# Patient Record
Sex: Female | Born: 1940 | Race: White | Hispanic: No | Marital: Single | Smoking: Never smoker
Health system: Southern US, Community
[De-identification: ages and names within clinical notes are randomized; demographics above are authoritative.]

## PROBLEM LIST (undated history)

## (undated) DIAGNOSIS — K219 Gastro-esophageal reflux disease without esophagitis: Secondary | ICD-10-CM

## (undated) DIAGNOSIS — K635 Polyp of colon: Secondary | ICD-10-CM

## (undated) DIAGNOSIS — F419 Anxiety disorder, unspecified: Secondary | ICD-10-CM

## (undated) DIAGNOSIS — I428 Other cardiomyopathies: Secondary | ICD-10-CM

## (undated) DIAGNOSIS — I10 Essential (primary) hypertension: Secondary | ICD-10-CM

## (undated) DIAGNOSIS — D649 Anemia, unspecified: Secondary | ICD-10-CM

## (undated) DIAGNOSIS — I509 Heart failure, unspecified: Secondary | ICD-10-CM

## (undated) DIAGNOSIS — I519 Heart disease, unspecified: Secondary | ICD-10-CM

## (undated) DIAGNOSIS — J45909 Unspecified asthma, uncomplicated: Secondary | ICD-10-CM

## (undated) DIAGNOSIS — M1990 Unspecified osteoarthritis, unspecified site: Secondary | ICD-10-CM

## (undated) DIAGNOSIS — F22 Delusional disorders: Secondary | ICD-10-CM

## (undated) DIAGNOSIS — K5790 Diverticulosis of intestine, part unspecified, without perforation or abscess without bleeding: Secondary | ICD-10-CM

## (undated) DIAGNOSIS — I5189 Other ill-defined heart diseases: Secondary | ICD-10-CM

## (undated) DIAGNOSIS — T4145XA Adverse effect of unspecified anesthetic, initial encounter: Secondary | ICD-10-CM

## (undated) DIAGNOSIS — I5022 Chronic systolic (congestive) heart failure: Secondary | ICD-10-CM

## (undated) DIAGNOSIS — F209 Schizophrenia, unspecified: Secondary | ICD-10-CM

## (undated) DIAGNOSIS — J449 Chronic obstructive pulmonary disease, unspecified: Secondary | ICD-10-CM

## (undated) HISTORY — DX: Other cardiomyopathies: I42.8

## (undated) HISTORY — DX: Gastro-esophageal reflux disease without esophagitis: K21.9

## (undated) HISTORY — DX: Heart failure, unspecified: I50.9

## (undated) HISTORY — DX: Essential (primary) hypertension: I10

## (undated) HISTORY — DX: Diverticulosis of intestine, part unspecified, without perforation or abscess without bleeding: K57.90

## (undated) HISTORY — PX: DIAGNOSTIC LAPAROSCOPY: SUR761

## (undated) HISTORY — PX: TONSILLECTOMY: SUR1361

## (undated) HISTORY — DX: Unspecified osteoarthritis, unspecified site: M19.90

## (undated) HISTORY — PX: HEEL SPUR SURGERY: SHX665

## (undated) HISTORY — PX: COLOSTOMY: SHX63

## (undated) HISTORY — DX: Unspecified asthma, uncomplicated: J45.909

## (undated) HISTORY — DX: Chronic systolic (congestive) heart failure: I50.22

## (undated) HISTORY — DX: Heart disease, unspecified: I51.9

## (undated) HISTORY — DX: Anxiety disorder, unspecified: F41.9

## (undated) HISTORY — DX: Polyp of colon: K63.5

## (undated) HISTORY — PX: CATARACT EXTRACTION: SUR2

## (undated) HISTORY — DX: Other ill-defined heart diseases: I51.89

## (undated) HISTORY — PX: COLOSTOMY TAKEDOWN: SHX5258

## (undated) HISTORY — DX: Anemia, unspecified: D64.9

---

## 1964-04-09 DIAGNOSIS — T8859XA Other complications of anesthesia, initial encounter: Secondary | ICD-10-CM

## 1964-04-09 HISTORY — DX: Other complications of anesthesia, initial encounter: T88.59XA

## 1964-04-09 HISTORY — PX: APPENDECTOMY: SHX54

## 1988-04-09 DIAGNOSIS — D649 Anemia, unspecified: Secondary | ICD-10-CM

## 1988-04-09 HISTORY — DX: Anemia, unspecified: D64.9

## 1993-04-09 DIAGNOSIS — J45909 Unspecified asthma, uncomplicated: Secondary | ICD-10-CM

## 1993-04-09 HISTORY — DX: Unspecified asthma, uncomplicated: J45.909

## 1999-07-17 ENCOUNTER — Other Ambulatory Visit: Admission: RE | Admit: 1999-07-17 | Discharge: 1999-07-17 | Payer: Self-pay | Admitting: Obstetrics and Gynecology

## 2000-06-26 ENCOUNTER — Ambulatory Visit (HOSPITAL_COMMUNITY): Admission: RE | Admit: 2000-06-26 | Discharge: 2000-06-26 | Payer: Self-pay | Admitting: Gastroenterology

## 2000-06-26 ENCOUNTER — Encounter (INDEPENDENT_AMBULATORY_CARE_PROVIDER_SITE_OTHER): Payer: Self-pay | Admitting: *Deleted

## 2000-07-15 ENCOUNTER — Encounter: Payer: Self-pay | Admitting: Internal Medicine

## 2000-07-15 ENCOUNTER — Encounter: Admission: RE | Admit: 2000-07-15 | Discharge: 2000-07-15 | Payer: Self-pay | Admitting: Internal Medicine

## 2000-07-29 ENCOUNTER — Other Ambulatory Visit: Admission: RE | Admit: 2000-07-29 | Discharge: 2000-07-29 | Payer: Self-pay | Admitting: Obstetrics and Gynecology

## 2001-04-09 DIAGNOSIS — K5790 Diverticulosis of intestine, part unspecified, without perforation or abscess without bleeding: Secondary | ICD-10-CM

## 2001-04-09 HISTORY — DX: Diverticulosis of intestine, part unspecified, without perforation or abscess without bleeding: K57.90

## 2001-06-11 ENCOUNTER — Other Ambulatory Visit: Admission: RE | Admit: 2001-06-11 | Discharge: 2001-06-11 | Payer: Self-pay | Admitting: Obstetrics and Gynecology

## 2002-04-09 ENCOUNTER — Emergency Department (HOSPITAL_COMMUNITY): Admission: EM | Admit: 2002-04-09 | Discharge: 2002-04-09 | Payer: Self-pay | Admitting: Emergency Medicine

## 2002-04-09 DIAGNOSIS — M1990 Unspecified osteoarthritis, unspecified site: Secondary | ICD-10-CM

## 2002-04-09 HISTORY — PX: CHOLECYSTECTOMY: SHX55

## 2002-04-09 HISTORY — DX: Unspecified osteoarthritis, unspecified site: M19.90

## 2003-02-27 ENCOUNTER — Encounter (INDEPENDENT_AMBULATORY_CARE_PROVIDER_SITE_OTHER): Payer: Self-pay | Admitting: *Deleted

## 2003-02-27 ENCOUNTER — Emergency Department (HOSPITAL_COMMUNITY): Admission: EM | Admit: 2003-02-27 | Discharge: 2003-02-27 | Payer: Self-pay | Admitting: Emergency Medicine

## 2003-03-25 ENCOUNTER — Encounter (INDEPENDENT_AMBULATORY_CARE_PROVIDER_SITE_OTHER): Payer: Self-pay | Admitting: *Deleted

## 2003-03-25 ENCOUNTER — Observation Stay (HOSPITAL_COMMUNITY): Admission: RE | Admit: 2003-03-25 | Discharge: 2003-03-26 | Payer: Self-pay | Admitting: General Surgery

## 2003-04-08 ENCOUNTER — Encounter: Admission: RE | Admit: 2003-04-08 | Discharge: 2003-04-08 | Payer: Self-pay | Admitting: Sports Medicine

## 2003-07-13 ENCOUNTER — Encounter: Admission: RE | Admit: 2003-07-13 | Discharge: 2003-07-13 | Payer: Self-pay | Admitting: Internal Medicine

## 2003-12-21 ENCOUNTER — Other Ambulatory Visit: Admission: RE | Admit: 2003-12-21 | Discharge: 2003-12-21 | Payer: Self-pay | Admitting: Internal Medicine

## 2004-02-02 ENCOUNTER — Inpatient Hospital Stay (HOSPITAL_COMMUNITY): Admission: AD | Admit: 2004-02-02 | Discharge: 2004-02-10 | Payer: Self-pay | Admitting: Psychiatry

## 2004-02-02 ENCOUNTER — Ambulatory Visit: Payer: Self-pay | Admitting: Psychiatry

## 2004-02-04 ENCOUNTER — Emergency Department (HOSPITAL_COMMUNITY): Admission: EM | Admit: 2004-02-04 | Discharge: 2004-02-04 | Payer: Self-pay | Admitting: Emergency Medicine

## 2004-10-07 ENCOUNTER — Inpatient Hospital Stay (HOSPITAL_COMMUNITY): Admission: EM | Admit: 2004-10-07 | Discharge: 2004-10-17 | Payer: Self-pay | Admitting: Psychiatry

## 2004-10-07 ENCOUNTER — Ambulatory Visit: Payer: Self-pay | Admitting: Psychiatry

## 2005-07-11 ENCOUNTER — Emergency Department (HOSPITAL_COMMUNITY): Admission: EM | Admit: 2005-07-11 | Discharge: 2005-07-11 | Payer: Self-pay | Admitting: Emergency Medicine

## 2005-12-01 ENCOUNTER — Emergency Department (HOSPITAL_COMMUNITY): Admission: EM | Admit: 2005-12-01 | Discharge: 2005-12-01 | Payer: Self-pay | Admitting: Emergency Medicine

## 2005-12-27 ENCOUNTER — Encounter: Admission: RE | Admit: 2005-12-27 | Discharge: 2005-12-27 | Payer: Self-pay | Admitting: Internal Medicine

## 2006-01-14 ENCOUNTER — Encounter: Admission: RE | Admit: 2006-01-14 | Discharge: 2006-02-07 | Payer: Self-pay | Admitting: Neurosurgery

## 2006-02-15 ENCOUNTER — Encounter (INDEPENDENT_AMBULATORY_CARE_PROVIDER_SITE_OTHER): Payer: Self-pay | Admitting: *Deleted

## 2006-02-15 ENCOUNTER — Encounter: Admission: RE | Admit: 2006-02-15 | Discharge: 2006-02-15 | Payer: Self-pay | Admitting: Internal Medicine

## 2006-03-26 ENCOUNTER — Encounter: Admission: RE | Admit: 2006-03-26 | Discharge: 2006-03-26 | Payer: Self-pay | Admitting: Internal Medicine

## 2006-03-26 ENCOUNTER — Encounter (INDEPENDENT_AMBULATORY_CARE_PROVIDER_SITE_OTHER): Payer: Self-pay | Admitting: *Deleted

## 2006-04-04 ENCOUNTER — Ambulatory Visit: Payer: Self-pay | Admitting: Internal Medicine

## 2006-04-09 HISTORY — PX: NEPHRECTOMY: SHX65

## 2006-04-09 HISTORY — PX: COLON SURGERY: SHX602

## 2006-04-26 ENCOUNTER — Encounter: Admission: RE | Admit: 2006-04-26 | Discharge: 2006-04-26 | Payer: Self-pay | Admitting: General Surgery

## 2006-04-26 ENCOUNTER — Encounter (INDEPENDENT_AMBULATORY_CARE_PROVIDER_SITE_OTHER): Payer: Self-pay | Admitting: *Deleted

## 2006-05-15 ENCOUNTER — Encounter (INDEPENDENT_AMBULATORY_CARE_PROVIDER_SITE_OTHER): Payer: Self-pay | Admitting: *Deleted

## 2006-05-15 ENCOUNTER — Encounter: Admission: RE | Admit: 2006-05-15 | Discharge: 2006-05-15 | Payer: Self-pay | Admitting: General Surgery

## 2006-05-22 ENCOUNTER — Encounter: Admission: RE | Admit: 2006-05-22 | Discharge: 2006-05-22 | Payer: Self-pay | Admitting: Internal Medicine

## 2006-05-29 ENCOUNTER — Encounter: Admission: RE | Admit: 2006-05-29 | Discharge: 2006-05-29 | Payer: Self-pay | Admitting: Internal Medicine

## 2006-07-16 ENCOUNTER — Encounter (INDEPENDENT_AMBULATORY_CARE_PROVIDER_SITE_OTHER): Payer: Self-pay | Admitting: *Deleted

## 2006-07-16 ENCOUNTER — Ambulatory Visit: Payer: Self-pay | Admitting: Internal Medicine

## 2006-07-16 ENCOUNTER — Inpatient Hospital Stay (HOSPITAL_COMMUNITY): Admission: RE | Admit: 2006-07-16 | Discharge: 2006-08-03 | Payer: Self-pay | Admitting: General Surgery

## 2006-07-20 ENCOUNTER — Encounter (INDEPENDENT_AMBULATORY_CARE_PROVIDER_SITE_OTHER): Payer: Self-pay | Admitting: *Deleted

## 2006-07-20 ENCOUNTER — Encounter (INDEPENDENT_AMBULATORY_CARE_PROVIDER_SITE_OTHER): Payer: Self-pay | Admitting: Specialist

## 2006-08-03 ENCOUNTER — Encounter (INDEPENDENT_AMBULATORY_CARE_PROVIDER_SITE_OTHER): Payer: Self-pay | Admitting: *Deleted

## 2006-08-08 ENCOUNTER — Ambulatory Visit (HOSPITAL_COMMUNITY): Admission: RE | Admit: 2006-08-08 | Discharge: 2006-08-08 | Payer: Self-pay | Admitting: Urology

## 2006-08-22 ENCOUNTER — Ambulatory Visit (HOSPITAL_COMMUNITY): Admission: RE | Admit: 2006-08-22 | Discharge: 2006-08-22 | Payer: Self-pay | Admitting: Urology

## 2006-08-22 ENCOUNTER — Emergency Department (HOSPITAL_COMMUNITY): Admission: EM | Admit: 2006-08-22 | Discharge: 2006-08-23 | Payer: Self-pay | Admitting: Emergency Medicine

## 2006-08-30 ENCOUNTER — Ambulatory Visit (HOSPITAL_COMMUNITY): Admission: RE | Admit: 2006-08-30 | Discharge: 2006-08-30 | Payer: Self-pay | Admitting: Urology

## 2006-09-17 ENCOUNTER — Encounter (INDEPENDENT_AMBULATORY_CARE_PROVIDER_SITE_OTHER): Payer: Self-pay | Admitting: *Deleted

## 2006-09-17 ENCOUNTER — Encounter (INDEPENDENT_AMBULATORY_CARE_PROVIDER_SITE_OTHER): Payer: Self-pay | Admitting: Urology

## 2006-09-17 ENCOUNTER — Inpatient Hospital Stay (HOSPITAL_COMMUNITY): Admission: RE | Admit: 2006-09-17 | Discharge: 2006-09-25 | Payer: Self-pay | Admitting: Urology

## 2006-10-31 ENCOUNTER — Encounter (INDEPENDENT_AMBULATORY_CARE_PROVIDER_SITE_OTHER): Payer: Self-pay | Admitting: *Deleted

## 2006-12-27 ENCOUNTER — Encounter: Admission: RE | Admit: 2006-12-27 | Discharge: 2006-12-27 | Payer: Self-pay | Admitting: Internal Medicine

## 2007-03-28 ENCOUNTER — Encounter (INDEPENDENT_AMBULATORY_CARE_PROVIDER_SITE_OTHER): Payer: Self-pay | Admitting: *Deleted

## 2007-03-28 ENCOUNTER — Encounter: Admission: RE | Admit: 2007-03-28 | Discharge: 2007-03-28 | Payer: Self-pay | Admitting: General Surgery

## 2007-05-21 ENCOUNTER — Encounter (INDEPENDENT_AMBULATORY_CARE_PROVIDER_SITE_OTHER): Payer: Self-pay | Admitting: *Deleted

## 2007-05-21 ENCOUNTER — Inpatient Hospital Stay (HOSPITAL_COMMUNITY): Admission: RE | Admit: 2007-05-21 | Discharge: 2007-05-28 | Payer: Self-pay | Admitting: General Surgery

## 2007-05-21 ENCOUNTER — Encounter (INDEPENDENT_AMBULATORY_CARE_PROVIDER_SITE_OTHER): Payer: Self-pay | Admitting: General Surgery

## 2007-06-02 ENCOUNTER — Encounter (INDEPENDENT_AMBULATORY_CARE_PROVIDER_SITE_OTHER): Payer: Self-pay | Admitting: *Deleted

## 2007-06-02 ENCOUNTER — Encounter: Admission: RE | Admit: 2007-06-02 | Discharge: 2007-06-02 | Payer: Self-pay | Admitting: General Surgery

## 2007-06-03 ENCOUNTER — Inpatient Hospital Stay (HOSPITAL_COMMUNITY): Admission: AD | Admit: 2007-06-03 | Discharge: 2007-06-12 | Payer: Self-pay | Admitting: General Surgery

## 2007-06-23 ENCOUNTER — Encounter (INDEPENDENT_AMBULATORY_CARE_PROVIDER_SITE_OTHER): Payer: Self-pay | Admitting: *Deleted

## 2007-07-03 ENCOUNTER — Ambulatory Visit (HOSPITAL_COMMUNITY): Admission: RE | Admit: 2007-07-03 | Discharge: 2007-07-03 | Payer: Self-pay | Admitting: General Surgery

## 2007-07-14 ENCOUNTER — Encounter (INDEPENDENT_AMBULATORY_CARE_PROVIDER_SITE_OTHER): Payer: Self-pay | Admitting: *Deleted

## 2007-07-21 ENCOUNTER — Inpatient Hospital Stay (HOSPITAL_COMMUNITY): Admission: AD | Admit: 2007-07-21 | Discharge: 2007-07-28 | Payer: Self-pay | Admitting: General Surgery

## 2007-08-08 ENCOUNTER — Ambulatory Visit (HOSPITAL_COMMUNITY): Admission: RE | Admit: 2007-08-08 | Discharge: 2007-08-08 | Payer: Self-pay | Admitting: General Surgery

## 2007-08-12 ENCOUNTER — Encounter: Admission: RE | Admit: 2007-08-12 | Discharge: 2007-08-12 | Payer: Self-pay | Admitting: Endocrinology

## 2007-08-19 ENCOUNTER — Encounter: Admission: RE | Admit: 2007-08-19 | Discharge: 2007-08-19 | Payer: Self-pay | Admitting: Interventional Radiology

## 2007-08-28 ENCOUNTER — Encounter: Admission: RE | Admit: 2007-08-28 | Discharge: 2007-08-28 | Payer: Self-pay | Admitting: General Surgery

## 2007-09-02 ENCOUNTER — Encounter (INDEPENDENT_AMBULATORY_CARE_PROVIDER_SITE_OTHER): Payer: Self-pay | Admitting: *Deleted

## 2007-10-22 ENCOUNTER — Encounter: Admission: RE | Admit: 2007-10-22 | Discharge: 2007-10-22 | Payer: Self-pay | Admitting: Internal Medicine

## 2008-04-09 DIAGNOSIS — N183 Chronic kidney disease, stage 3 unspecified: Secondary | ICD-10-CM

## 2008-04-09 DIAGNOSIS — N189 Chronic kidney disease, unspecified: Secondary | ICD-10-CM

## 2008-04-09 HISTORY — DX: Chronic kidney disease, unspecified: N18.9

## 2008-04-09 HISTORY — DX: Chronic kidney disease, stage 3 unspecified: N18.30

## 2008-04-09 HISTORY — PX: INCISIONAL HERNIA REPAIR: SHX193

## 2008-06-04 ENCOUNTER — Encounter: Admission: RE | Admit: 2008-06-04 | Discharge: 2008-06-04 | Payer: Self-pay

## 2008-06-04 ENCOUNTER — Encounter (INDEPENDENT_AMBULATORY_CARE_PROVIDER_SITE_OTHER): Payer: Self-pay | Admitting: *Deleted

## 2008-06-12 IMAGING — CR DG CHEST 1V PORT
1 series · 1 of 1 positions shown · non-contrast
Comparison: 5055 hours.

CLINICAL DATA: PICC placement.
 PORTABLE CHEST ? 1 VIEW:

[view not recorded]
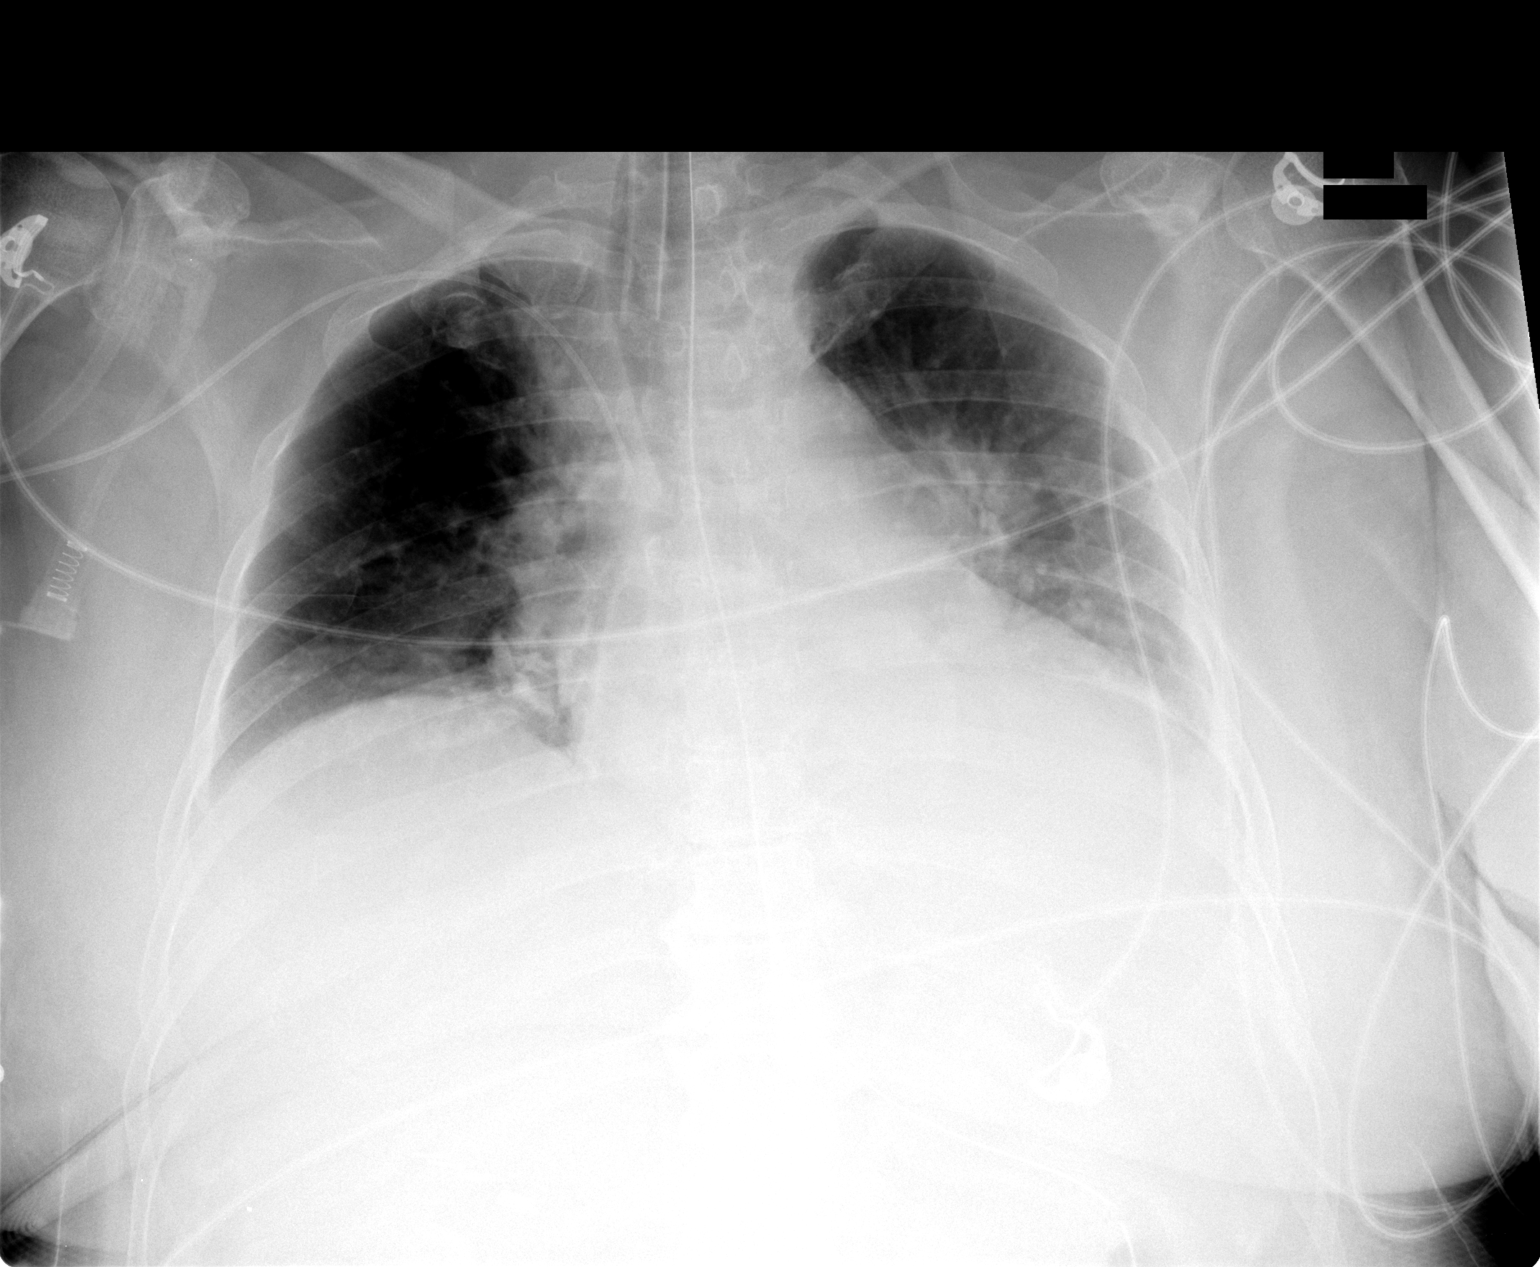

[1 of 1 positions shown; findings below may reference images not displayed]

FINDINGS: A right upper extremity PICC has been placed with its tip in the mid SVC, approximately 4 cm above the right atrium.  No definite change in the heart or lungs.  Endotracheal tube unchanged.
IMPRESSION: 1.  Right upper extremity PICC placement as above. 
 2.  No significant change in the lungs.

## 2008-06-12 IMAGING — CR DG CHEST 1V PORT
1 series · 1 of 1 positions shown · non-contrast
Comparison: 07/21/06.

CLINICAL DATA: Recurrent sigmoid diverticulitis.  Consolidation/atelectasis in the chest.  Patient is intubated.  
 PORTABLE CHEST:

[view not recorded]
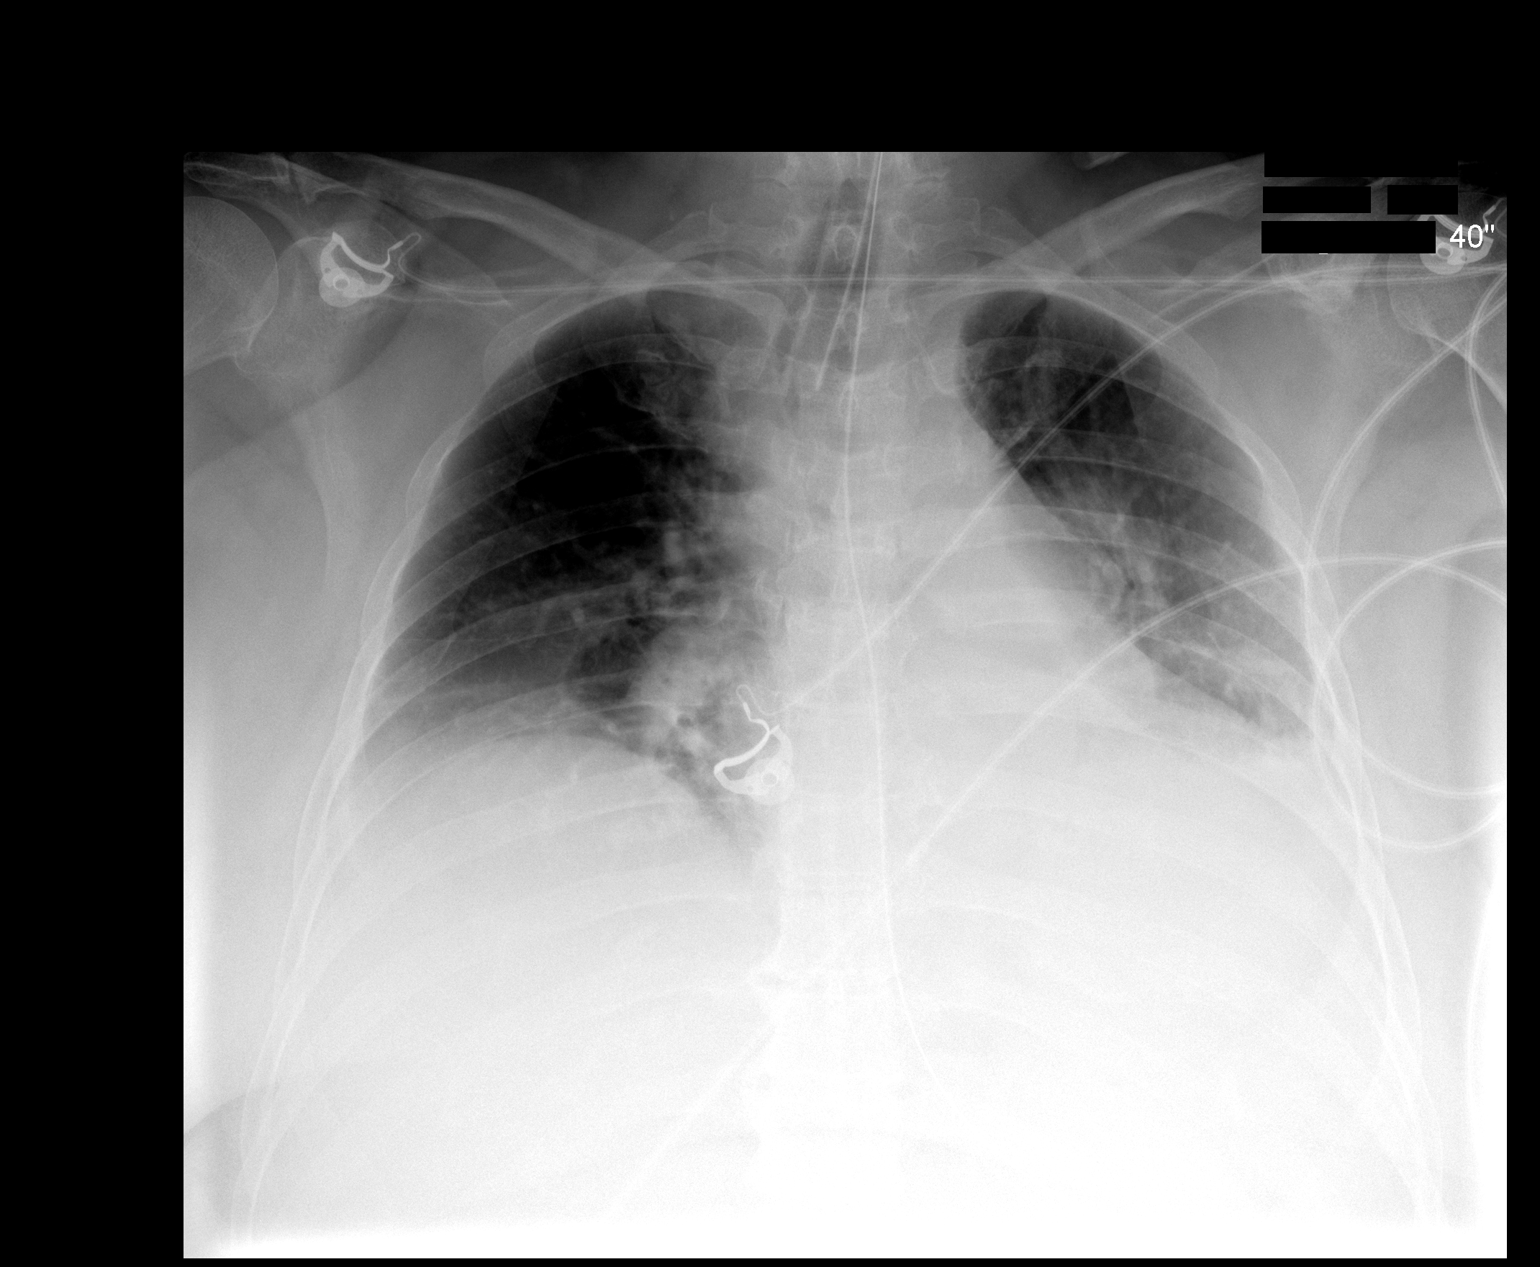

[1 of 1 positions shown; findings below may reference images not displayed]

FINDINGS: Endotracheal tube and NG tube remain in place.  There has been an interval increase in bilateral effusions and basilar airspace disease, particularly on the left.  Cardiomegaly.
IMPRESSION: Increasing atelectasis and effusions, left greater than right.

## 2008-10-25 ENCOUNTER — Encounter: Admission: RE | Admit: 2008-10-25 | Discharge: 2008-10-25 | Payer: Self-pay | Admitting: Internal Medicine

## 2008-10-25 ENCOUNTER — Encounter (INDEPENDENT_AMBULATORY_CARE_PROVIDER_SITE_OTHER): Payer: Self-pay | Admitting: *Deleted

## 2008-12-01 ENCOUNTER — Ambulatory Visit: Payer: Self-pay | Admitting: Internal Medicine

## 2008-12-01 DIAGNOSIS — R1031 Right lower quadrant pain: Secondary | ICD-10-CM | POA: Insufficient documentation

## 2008-12-01 DIAGNOSIS — K5730 Diverticulosis of large intestine without perforation or abscess without bleeding: Secondary | ICD-10-CM | POA: Insufficient documentation

## 2009-04-23 IMAGING — CR DG CHEST 2V
2 series · 2 of 2 positions shown · non-contrast
Comparison: 12/27/06.

CLINICAL DATA: Colostomy take down, 05/21/07.  Fever.
 TWO VIEW CHEST:

[w chest pa]
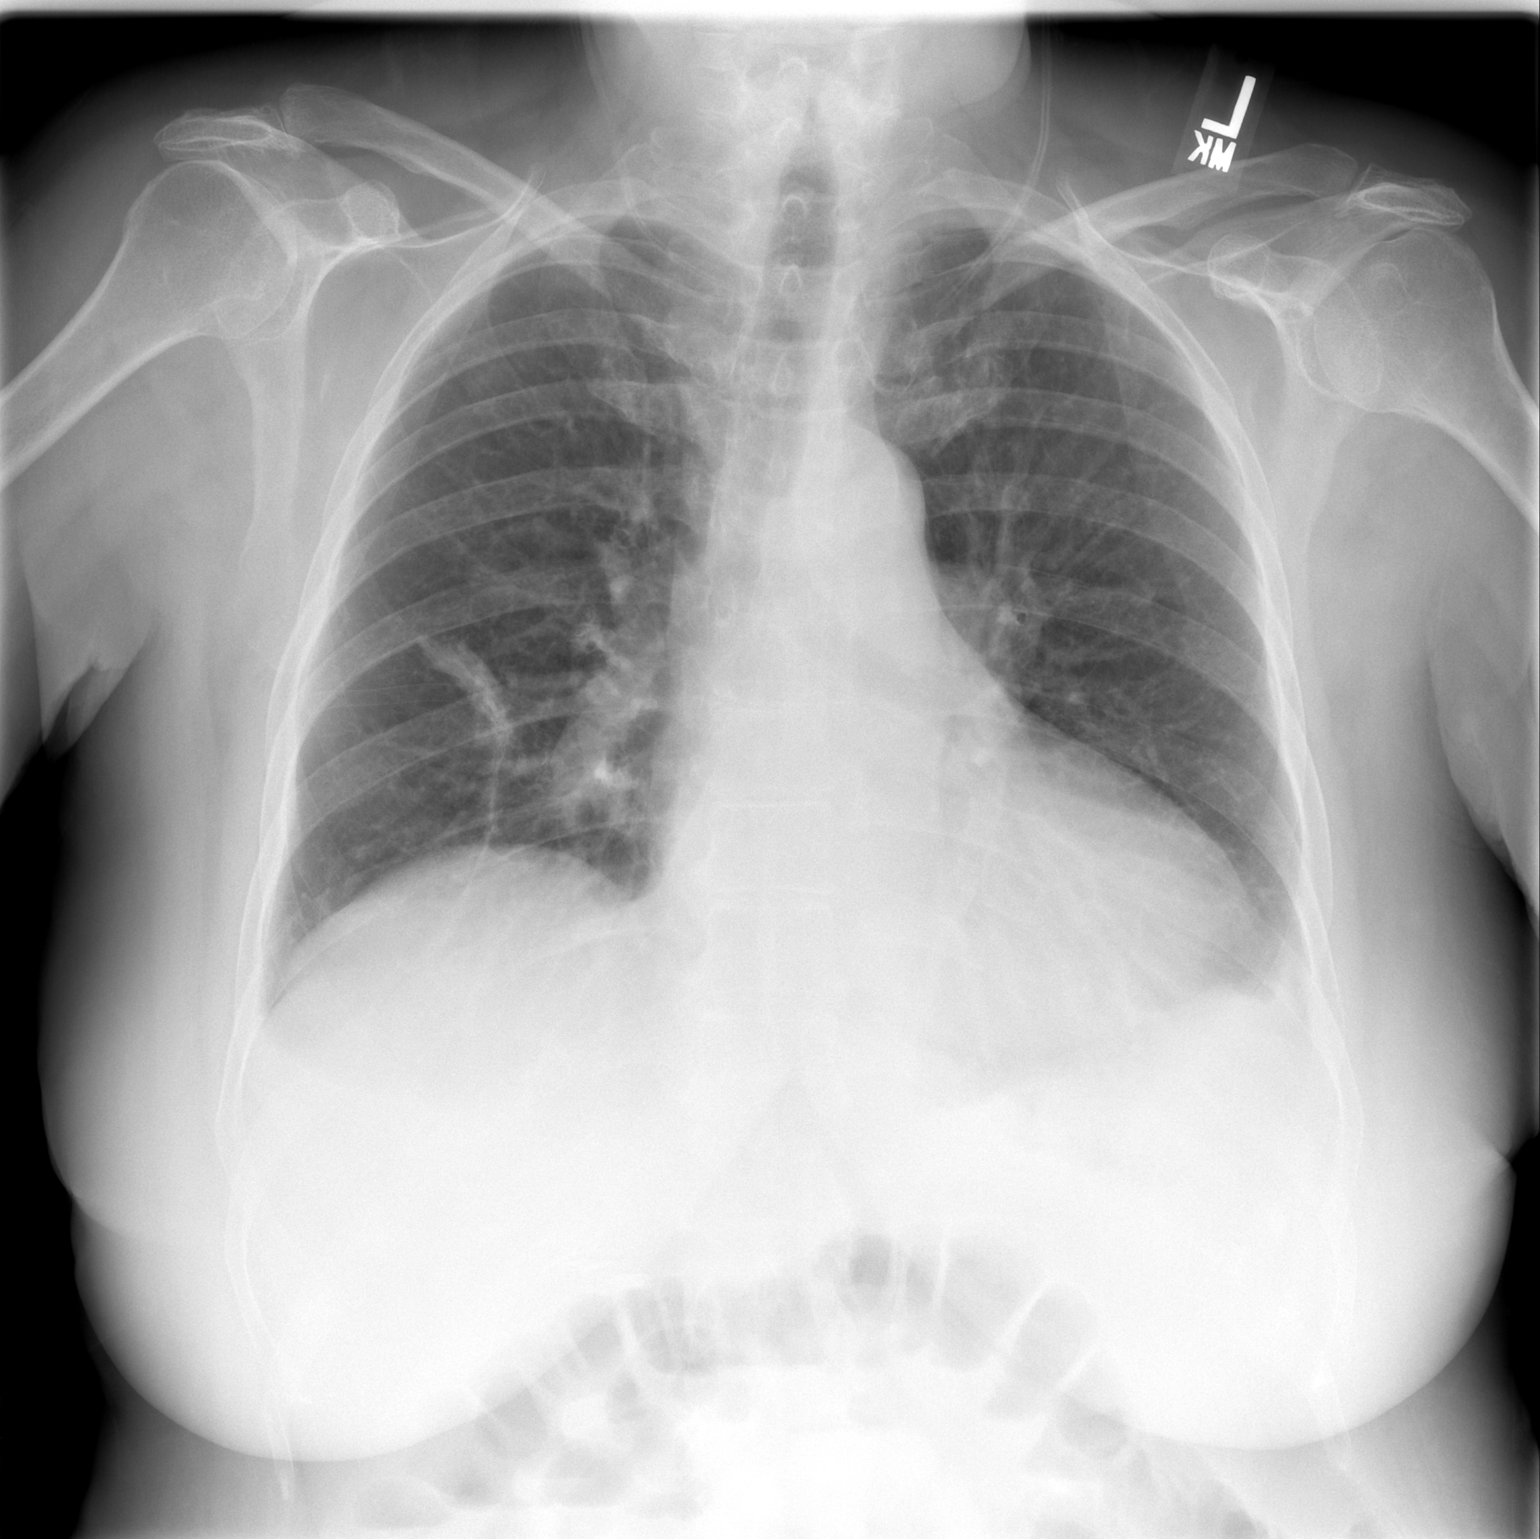

[w chest lat]
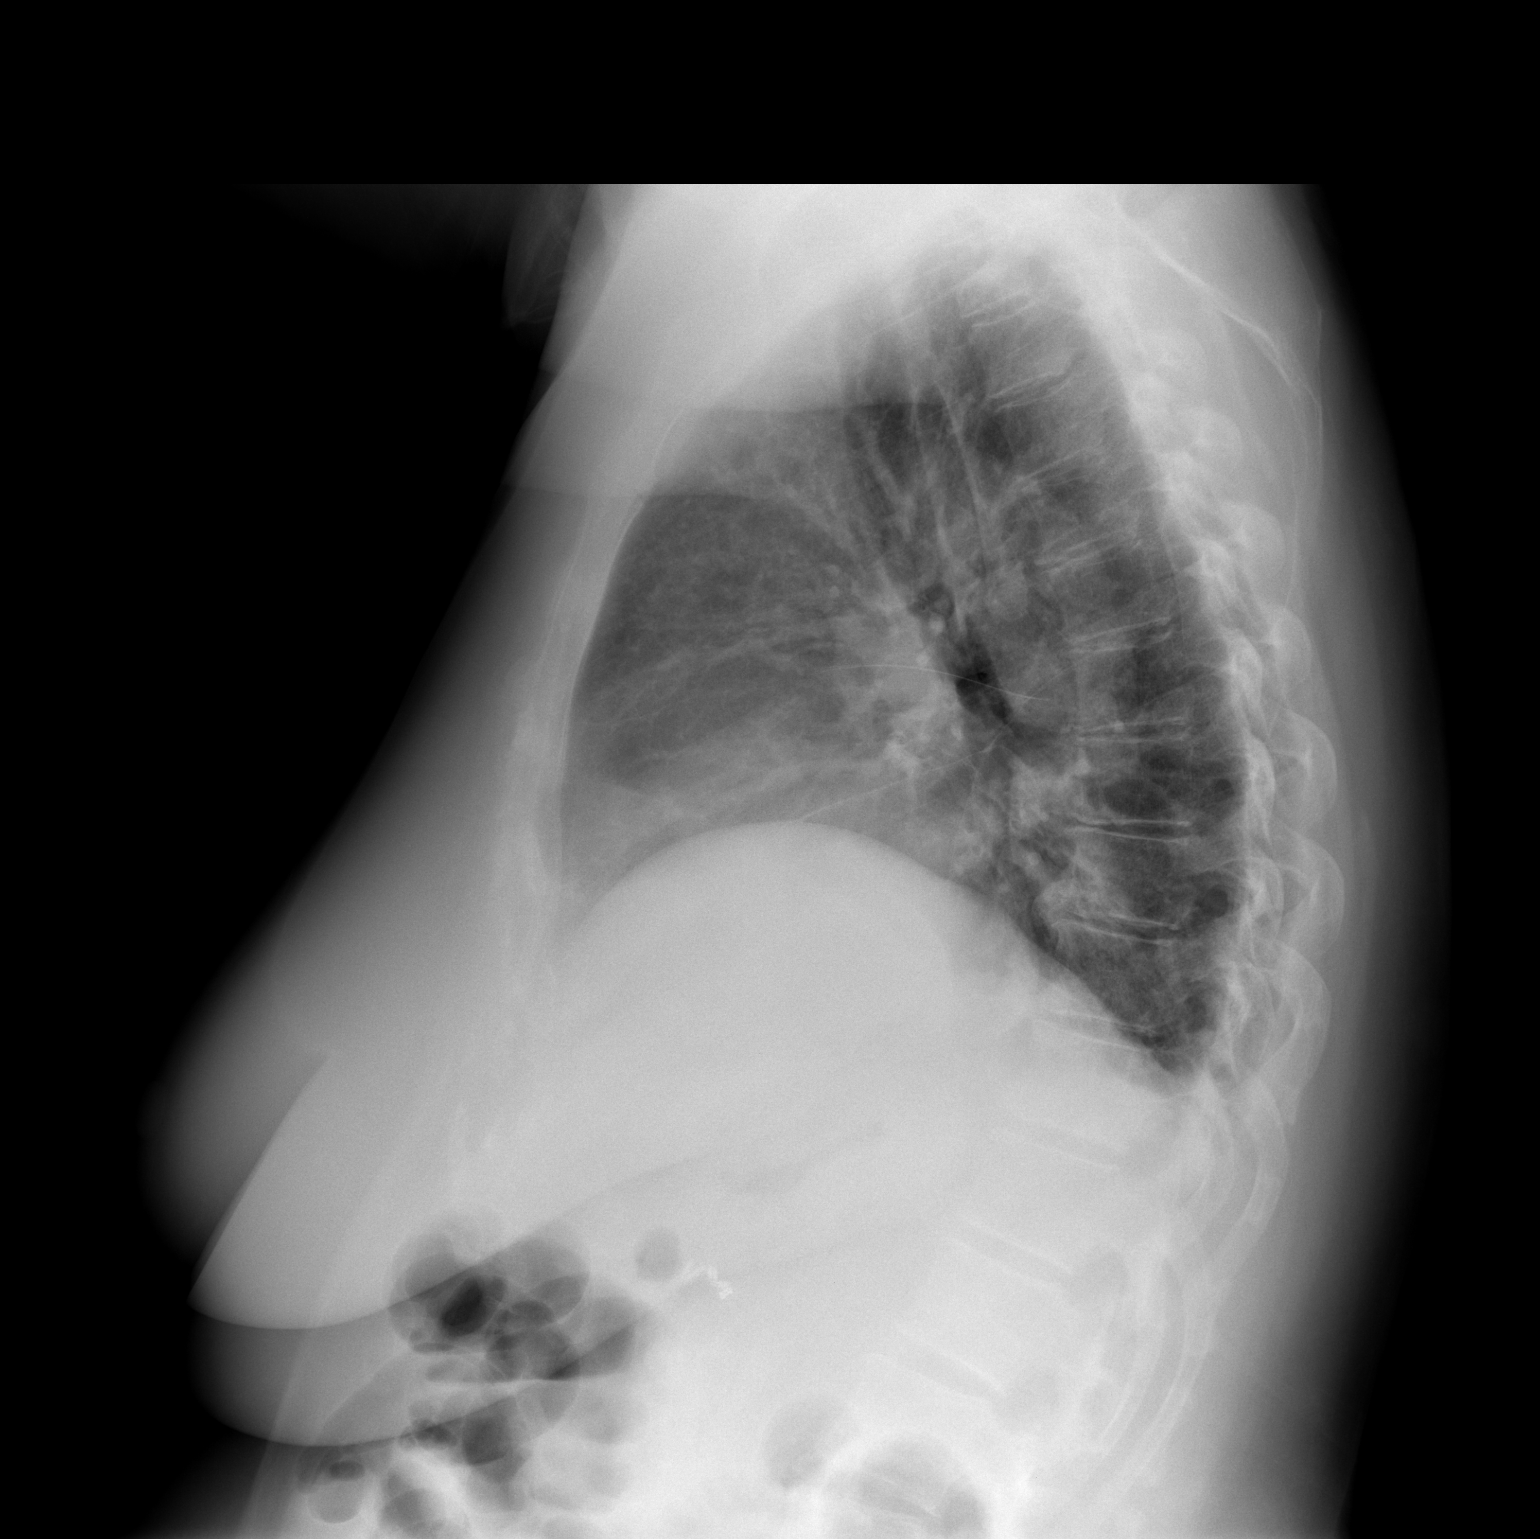

[2 of 2 positions shown; findings below may reference images not displayed]

There is mild cardiomegaly with left ventricular configuration.  There is an area of linear atelectasis seen within the right mid lung zone.  There are no definite acute infiltrates.  There is no evidence for mediastinal or hilar adenopathy.
IMPRESSION: Atelectatic changes noted within the right mid lung zone.  No definite acute infiltrate.  Mild cardiomegaly.

## 2009-04-23 IMAGING — CT CT PELVIS W/O CM
2 of 4 series · 16 of 46 positions shown, 18 images · IV contrast (OMNI 350, WATER)
Comparison: none

CLINICAL DATA: Colostomy take-down with drain in place.  Fever.  
ABDOMEN CT WITHOUT CONTRAST:
TECHNIQUE: Multidetector CT imaging of the abdomen was performed following the standard protocol without IV contrast.
TECHNIQUE: Multidetector CT imaging of the pelvis was performed following the standard protocol without IV contrast.

[Series 3: routine abdomen · axial · 0.90mm/px · z∈[-486,-72]mm · 13 of 91 slices shown, 15 images]
[im 4/91  soft-tissue]
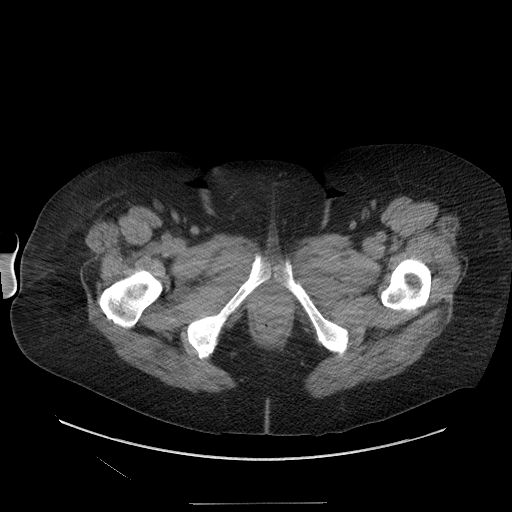
[im 4/91  bone]
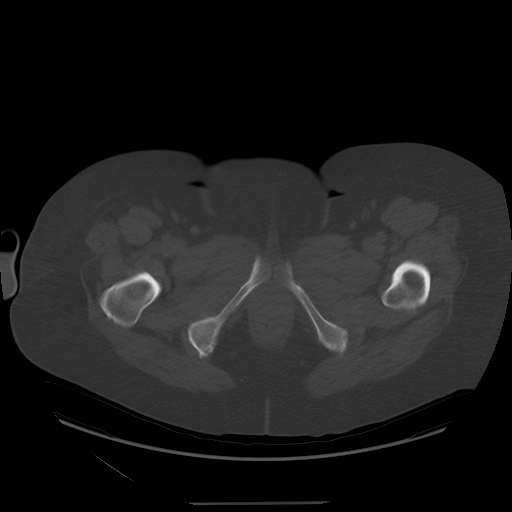
[im 12/91  soft-tissue]
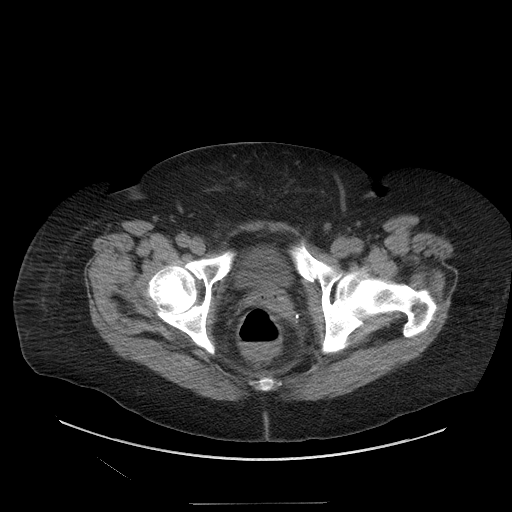
[im 19/91  soft-tissue]
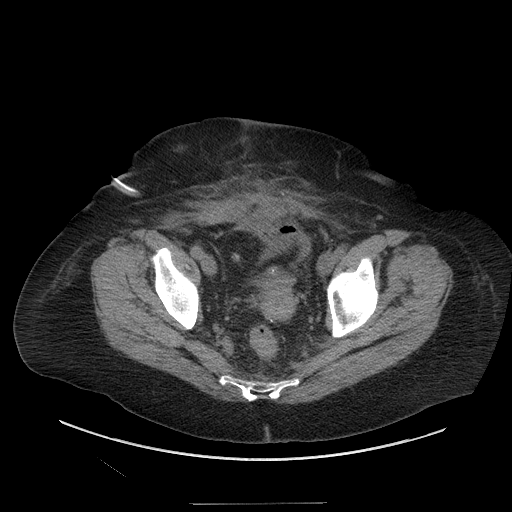
[im 27/91  soft-tissue]
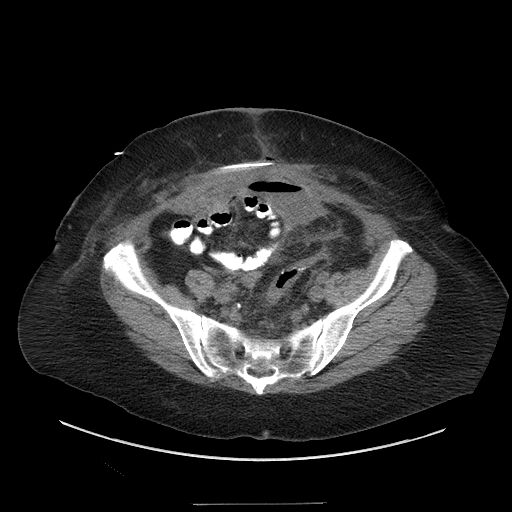
[im 31/91  soft-tissue]
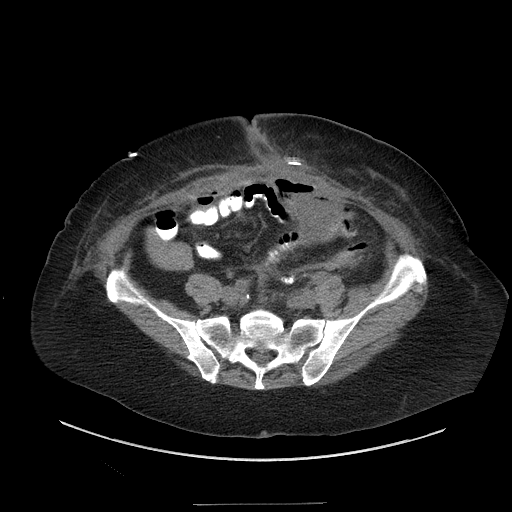
[im 38/91  soft-tissue]
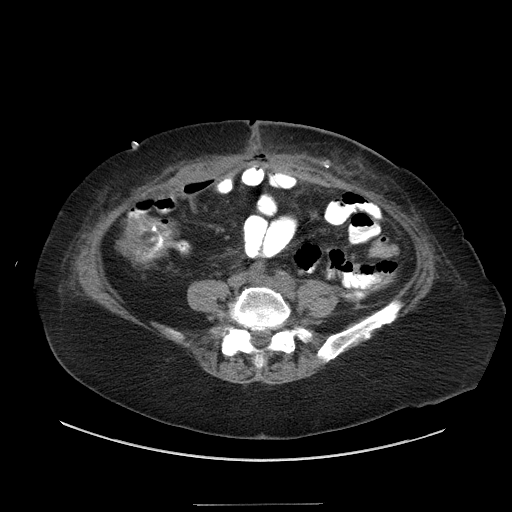
[im 46/91  soft-tissue]
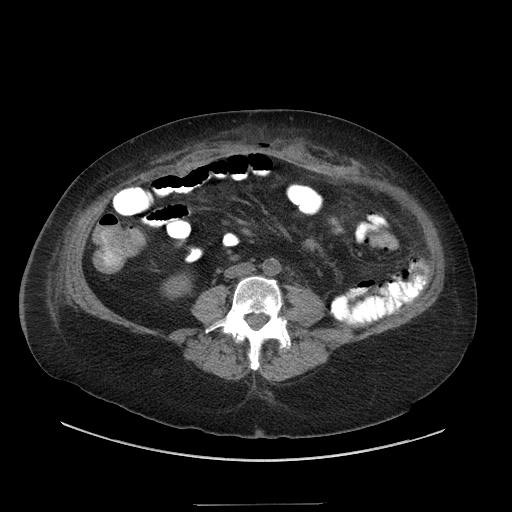
[im 53/91  soft-tissue]
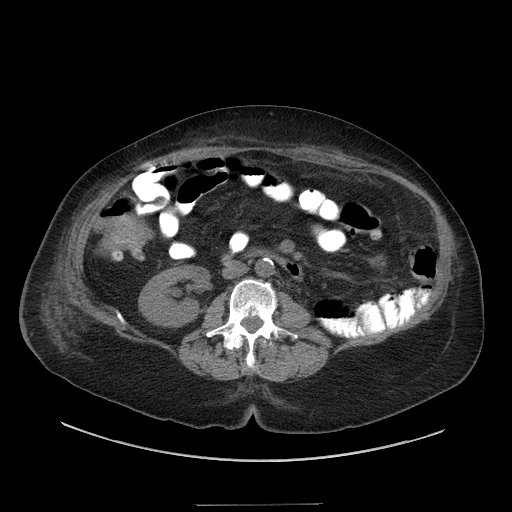
[im 61/91  soft-tissue]
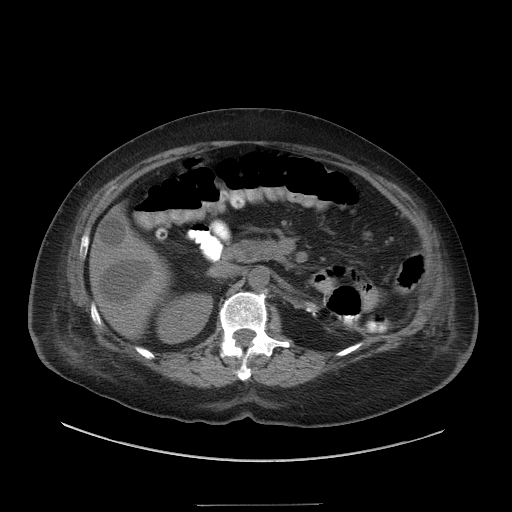
[im 61/91  bone]
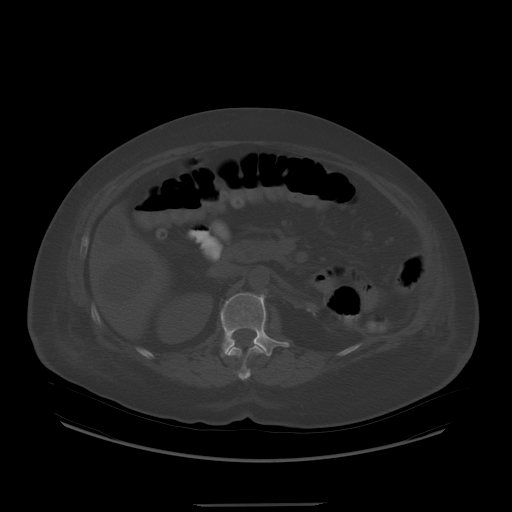
[im 64/91  soft-tissue]
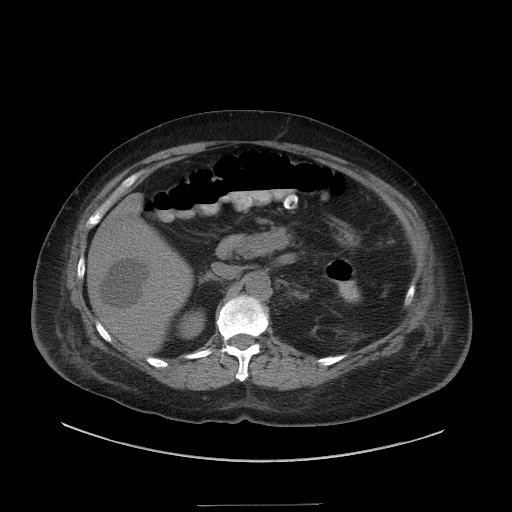
[im 72/91  soft-tissue]
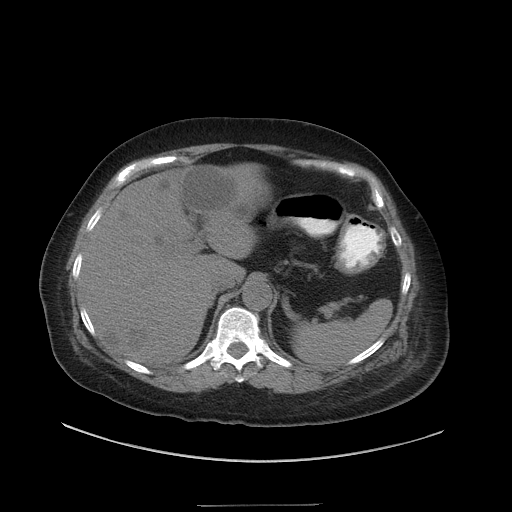
[im 79/91  soft-tissue]
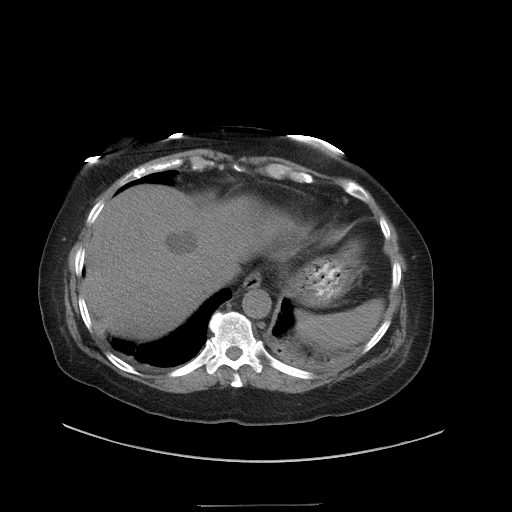
[im 87/91  soft-tissue]
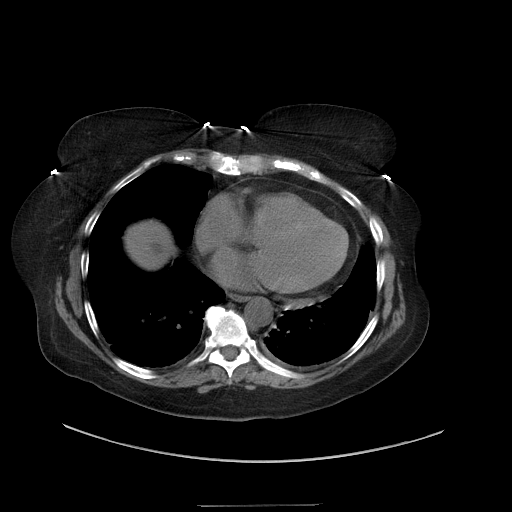

[Series 602: sagittal body · sagittal · 0.90mm/px · 3 of 186 slices shown]
[im 62/186  soft-tissue]
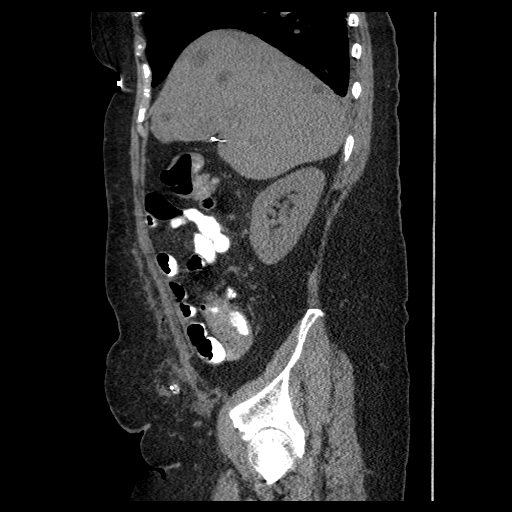
[im 83/186  soft-tissue]
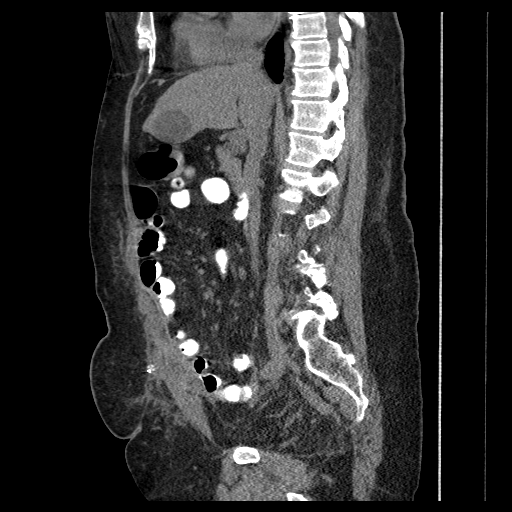
[im 103/186  soft-tissue]
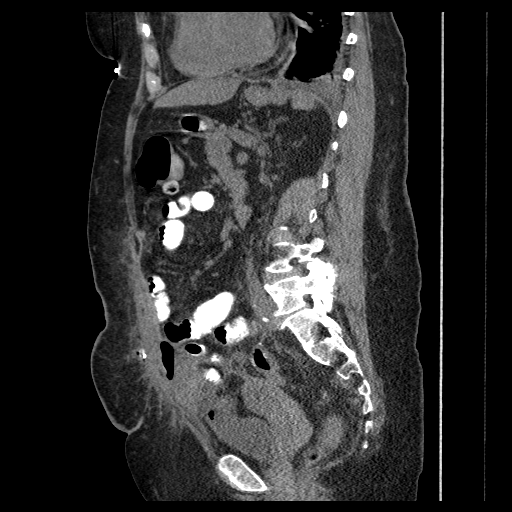

[16 of 46 positions shown; findings below may reference images not displayed]

FINDINGS: Bilateral lower lobe atelectasis, left greater than right.  No pleural fluid.  Heart size stable.  No pericardial effusion or pleural effusion.  
There are multiple low attenuation lesions in the liver, the largest of which is in the left hepatic lobe measuring 4.4 x 5.0 cm.  These appear well circumscribed and are likely cysts.  Cholecystectomy.  Adrenal glands and right kidney unremarkable. Left kidney is absent.  Spleen, pancreas, and stomach are unremarkable.
Abdominal lymph nodes are not enlarged by CT size criteria.
IMPRESSION: 1.  No acute findings.
PELVIS CT WITHOUT CONTRAST:
FINDINGS: There are postoperative changes in the ventral abdominal wall from colostomy take-down and ventral hernia repair.  Jackson-Pratt drain is seen in the superficial soft tissues overlying the rectus abdominis musculature.  There are locules of subcutaneous emphysema.  A fluid collection and associated air are seen in the anterior aspect of the pelvis, deep to the lower rectus abdominis muscles in the midline.  It measures 3.3 x 5.7 cm.  Phlegmonous material and/or fluid extend inferiorly.  There are locules of postoperative pneumoperitoneum.  Postoperative changes are seen in the colon.  Uterus is visualized.  No pathologically enlarged lymph nodes.  No worrisome lytic or sclerotic lesions.
IMPRESSION: 1.  Intrapelvic abscess and pneumoperitoneum.  Percutaneous drain lies in the subcutaneous fat overlying the rectus abdominis musculature, outside the abscess.
2.  Postoperative colostomy takedown and ventral hernia repair. 
3.  These findings were discussed with Dr. Chikilin Amato at [DATE] on 06/02/07.

## 2010-04-29 ENCOUNTER — Encounter: Payer: Self-pay | Admitting: Sports Medicine

## 2010-04-30 ENCOUNTER — Encounter: Payer: Self-pay | Admitting: Internal Medicine

## 2010-08-21 ENCOUNTER — Other Ambulatory Visit: Payer: Self-pay | Admitting: Surgery

## 2010-08-21 DIAGNOSIS — R110 Nausea: Secondary | ICD-10-CM

## 2010-08-22 NOTE — Op Note (Signed)
[name], [name]:  [id number]   MEDICAL RECORD NO.:  [id number]          PATIENT TYPE:  INP   LOCATION:  1228                         FACILITY:  [facility]   PHYSICIAN:  Leonie Man, M.D.   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   PROCEDURE:  Extensive pelvic adhesiolysis, greater than 90 minutes plus  small bowel resections and anastomosis.   SURGEON:  Leonie Man, M.D.   ASSISTANT:  Dr. Marcelyn Bruins.   ANESTHESIA:  General.   NOTE:  The patient is  a 70 year old female whose history is that she  underwent a laparoscopic bowel resection for diverticular disease.  During the course of the operation she sustained a ureteral injury which  was repaired at the time; however, the repair failed.  A second repair  apparently also failed.  It was at today's operation, the urologist came  in and did a left nephrectomy with an attempt to close the fistula  between the bladder and Hartmann pouch.  She   DICTATION ENDED HERE      Leonie Man, M.D.  Electronically Signed     PB/MEDQ  D:  09/17/2006  T:  09/18/2006  Job:  [id number]

## 2010-08-22 NOTE — H&P (Signed)
NAMEDENIS, KOPPEL NO.:  [id number]   MEDICAL RECORD NO.:  [id number]          PATIENT TYPE:  INP   LOCATION:  1228                         FACILITY:  [facility]   PHYSICIAN:  Jamison Neighbor, M.D.  DATE OF BIRTH:  02-06-1941   DATE OF ADMISSION:  09/17/2006  DATE OF DISCHARGE:                              HISTORY & PHYSICAL   ADMISSION DIAGNOSES:  1. Ureterocolonic fistula.  2. Esophageal reflux disease.  3. Arthritis.  4. Asthma.  5. Anxiety.   HISTORY OF PRESENT ILLNESS:  This 70 year old female has a complicated  urologic history.  The patient's medical history is pertinent for the  fact that she has severe case of diverticular disease and as undergoing  laparoscopic colon resection by Dr. Karie Soda.  Dr. Michaell Cowing noted there  was injury to the ureter and he asked that we place a stent so that he  could repair this laparoscopically.  This was done in what appeared to  be a relatively routine fashion on the day of her initial surgery.  The  patient developed a wound breakdown with both stool and urine exiting  the wound and Dr. Consuello Bossier and Dr. Marcine Matar proceeded  with exploration.  This was done on a weekend when the leakage was  started.  The patient underwent colostomy drainage with an end colostomy  and Hartman's pouch formation.  She also had a Boari flap done to repair  the ureter.  The patient initially did well but then ended up with a  urine leak and eventually it was clear the urine was coming out through  her rectum.  The patient had several nephrostomies done as well as  cystoscopies in order to try to determine if this area was healing.  Even with the nephrostomy and Foley catheter drainage there was no  improvement.  The patient began to get quite ill from this requiring  antibiotic therapy and it was felt that something needed to be done to  fix this problem relatively definitely.  We certainly gave consideration  to trying  to repair this but since two repairs have broken down, it was  felt it might be best to remove that left kidney and try to eliminate  urine on that side.  The patient did have split renal function study  done indicating there was more than adequate function on the right hand  side.  She is to be admitted following exploration and left nephrectomy.   PAST MEDICAL HISTORY:  Remarkable of course for the diverticular disease  and the sequelae of that surgery.  Otherwise, her problems include  asthma, arthritis, esophageal reflux disease and anxiety.   CURRENT MEDICATIONS:  1. Xopenex nebulizer.  2. Azmacort inhaler.  3. Risperdal.  4. Ativan.  5. Pain medicine postoperatively.   ALLERGIES:  The patient does have a past problem with HALDOL.   PAST SURGICAL HISTORY:  She has had no surgery other than the procedures  described above.   FAMILY HISTORY:  Pertinent for father dying of MI at 9.  Her mother was  age 31 and also died of  heart disease.   SOCIAL HISTORY:  The patient does not smoke.  She does not use alcohol.  She has no children but does have a very attentive niece.  Her social  history is otherwise unremarkable.   REVIEW OF SYSTEMS:  She has some sinus problems, cough, shortness of  breath, back pain, joint pain, abdominal pain and anxiety.   PHYSICAL EXAMINATION:  GENERAL APPEARANCE:  On exam today she is a well-  developed, well-nourished, female in no acute distress.  HEENT:  Normocephalic, atraumatic.  NEUROLOGICAL:  Cranial nerves II-XII are grossly intact.  No deficits.  NECK:  Supple with no adenopathy or thyromegaly.  CHEST/LUNGS:  Respirations are unlabored.  HEART: Has a regular rate and rhythm without no murmurs, thrills,  gallops, rubs or heaves.  ABDOMEN:  Soft.  Patient has a lower midline incision that still has a  few open areas that are healing by second intent.  The patient has a  well functional colostomy on the left-hand side.  EXTREMITIES:  Have no  cyanosis, clubbing or edema.  SKIN:  Normal.   IMPRESSION:  Fistula from anastomotic site of the left ureter to the  Boari flap connecting into the Hartman's pouch.   PLAN:  Admit following surgical intervention with left nephrectomy and  repair of the fistula.           ______________________________  Jamison Neighbor, M.D.  Electronically Signed     RJE/MEDQ  D:  09/17/2006  T:  09/18/2006  Job:  [id number]   cc:   Anselm Pancoast. Zachery Dakins, M.D.  [address]  [place]  [place] [address]

## 2010-08-22 NOTE — Discharge Summary (Signed)
NAMECLAUDENE, [name]:  [id number]   MEDICAL RECORD NO.:  [id number]          PATIENT TYPE:  INP   LOCATION:  5159                         FACILITY:  [facility]   PHYSICIAN:  Ollen Gross. Vernell Morgans, M.D. DATE OF BIRTH:  11/29/1940   DATE OF ADMISSION:  05/21/2007  DATE OF DISCHARGE:  05/28/2007                               DISCHARGE SUMMARY   HOSPITAL COURSE:  Ms. Malveaux is a 70 year old, white female who was  brought to the operating room on February 11, for a colostomy takedown  and repair of both parastomal and ventral hernias.  She tolerated this  very well.  Postoperatively, she was continued with an NG tube and bowel  rest until her bowel function returned.  Her psychiatric medications  were held, although she was able to receive Risperdal IM.  On postop day  #3, we were able to discontinue her Foley and clamp her NG tube.  We  were able to discontinue her NG tube on postop day #4 and start a diet  on postop day #5.  She continued to improve.  On February 17, we were  able to discontinue one of the postop drains.  The second drain was left  in and she was discharged home with a drain in place.  She was  tolerating a diet.  Her bowel function had returned and she was doing  very well.  On February 18, she was ready for discharge home.   MEDICATIONS:  She was to resume her home medicines and given a  prescription for Vicodin for pain.   ACTIVITIES:  No heavy lifting.   DIET:  As tolerated.   DISCHARGE DIAGNOSES:  1. Ventral hernia repair.  2. Parastomal hernia.  3. Colostomy takedown.   FOLLOW UP:  Followup will be with Dr. Carolynne Edouard in a week.      Ollen Gross. Vernell Morgans, M.D.  Electronically Signed     PST/MEDQ  D:  06/23/2007  T:  06/23/2007  Job:  [id number]

## 2010-08-22 NOTE — Discharge Summary (Signed)
NAMEJONISHA, KINDIG NO.:  [id number]   MEDICAL RECORD NO.:  [id number]          PATIENT TYPE:  INP   LOCATION:  5154                         FACILITY:  [facility]   PHYSICIAN:  Ollen Gross. Vernell Morgans, M.D. DATE OF BIRTH:  1941/02/19   DATE OF ADMISSION:  06/03/2007  DATE OF DISCHARGE:  06/12/2007                               DISCHARGE SUMMARY   Ms. [name] is a 70 year old white female who recently had undergone a  complicated colostomy takedown and ventral and parastomal hernia  repairs.  She initially did well and went home.  She then represented to  the clinic with signs of dehydration and failure to thrive.  She was  also running some fevers.  She was readmitted to the hospital and  underwent a CT scan which showed a small anterior intra-abdominal  abscess.  This was drained nicely by interventional radiology.  As she  was rehydrated she felt better.  She was started on Cipro for  antibiotics and tolerated this well.  She gradually improved, her diet  was advanced.  Her percutaneous abscess drain was left in.  She also was  found to have a urinary tract infection which was also sensitive to  Cipro and she did very well until on June 12, 2007 she was ready for  discharge to home.  Home Health was arranged to flush her abscess drain  daily.  She was switched to an oral antibiotic and on June 12, 2007 was  discharged home.   MEDICATIONS:  She was to resume her home meds and she was sent home on  p.o. Keflex.   ACTIVITIES:  No heavy lifting.   DIET:  As tolerated.   FINAL DIAGNOSIS:  Intra-abdominal abscess after colostomy takedown and  complicated ventral hernia repair.  Followup with me, Dr. Carolynne Edouard, in a  week and she is discharged home.      Ollen Gross. Vernell Morgans, M.D.  Electronically Signed     PST/MEDQ  D:  07/14/2007  T:  07/14/2007  Job:  [id number]

## 2010-08-22 NOTE — Op Note (Signed)
[name], [name]:  [id number]   MEDICAL RECORD NO.:  [id number]          PATIENT TYPE:  INP   LOCATION:  1228                         FACILITY:  [facility]   PHYSICIAN:  Jamison Neighbor, M.D.  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   PROCEDURE:  Left nephroureterectomy, closure of urine leak from Boari  flap, extensive lysis of adhesions with small bowel resection x2 by Dr.  Lurene Shadow, cystogram.   SURGEON:  Jamison Neighbor, M.D.   ASSISTANT:  Bertram Millard. Dahlstedt, M.D.   INTRAOPERATIVE CONSULTATION:  Leonie Man, M.D.   COMPLICATIONS:  Need for small bowel resection.   DRAINS:  1. Foley catheter.  2. NG tube.  3. Blake drain.   PATHOLOGY SPECIMENS:  1. Left kidney and ureter.  2. Resected small bowel, both sent to pathology.   ESTIMATED BLOOD LOSS:  800 mL.   HISTORY:  This 70 year old female has a very complicated history.  The  patient had severe diverticular disease, was undergoing a laparoscopic  colectomy  by Dr. Karie Soda back in April.  Dr. Michaell Cowing noted that the  patient had intraoperative injury and urologic consultation was sought.  Dr. Michaell Cowing felt that this could be repaired laparoscopically.  A  cystoscopic examination at that time showed that the patient did have an  intact distal ureter.  A guidewire was passed up through the ureter and  then advanced into the proximal ureter and then a stent was advanced  over the kidney allowing for approximation.  Dr. Michaell Cowing then completed  anastomosis and what looked like a reasonable repair.   Unfortunately, this broke down and several days postoperatively, the  patient was noted the urine way he was taped she was taken to the  operating room where a diverting colostomy  was done because the patient  also had a bowel leak.  Dr. Marcine Matar performed a Boari flap  urinary diversion at the time of the colostomy diversion.   The patient is fully recovered.  She has been out of hospital and has  done fairly well with the colostomy but she has developed a urine leak.  At first this could be seen exiting out the JP drain.  A stent had been  left in place and nephrostomy tube was placed to divert the urine.  Eventually was noted that the patient was starting  to pass urine from  the rectum and it became clear with imaging studies that there was now  urine coming out of the anastomosis between the ureter and the Boari  flap and then entering the [facility] pouch.  The area was carefully  inspected with imaging studies and was clear that would be quite  difficult to do a formal repair of this since the patient has already  had two repairs break down and still had significant inflammatory  response to the long standing diverticulitis.  The patient and her  family was given several options.  A discussion was made as to whether  the patient should be transferred over to [facility] for further  evaluation.  The patient did not wish to proceed in this fashion.  After  careful consideration, she decided to go and have the left kidney  removed to divert the urine on that side.  We did a split renal function  study and was more that adequate function of the right-hand side.  The  patient is aware of the fact that it is possible there could be  fistulization from the Hartmann's pouch to the bladder that may require  additional therapy.  This is planned to be a definitive procedure to  take care of the urinary system of the left-hand side.  Full informed  consent was obtained.   PROCEDURE:  After successful induction of general anesthesia the patient  was placed in the dorsal lithotomy position, prepped with Betadine and  draped in usual sterile fashion.  A cystogram  performed through a Foley  catheter and was clear that the patient did have a small leak that went  out through the Boari flat and into the Hartmann's pouch.  It was felt  that the site of the leak would be the junction between the Boari flap  and the ureter.  The patient was then placed into a flank position with  the kidney rest up and the bed flexed.  The patient was appropriately  padded including axillary roll.  The beanbag was used to support the  patient and was secured to the bed with tape.  A flank incision was made  coming down onto the tip of the twelfth rib which was partially  resected.  The oblique musculature was opened allowing entry into the  retroperitoneal space.  The kidney could be identified.  The nephrostomy  tube was identified on the inside and was cut.  The patient was found to  have a lower pole ureter which was controlled with multiple clips.  Three clips were left on the patient side and two on the specimen side  and it was then divided.  The ureter was identified and was freed up  with a vessel loop was placed.  The renal vein was identified and was  retracted allowing exposure the renal artery.  The renal artery was  ligated five times and was divided.  The patient had a second branch  renal artery which was identified.  This was also ligated, divided.  The  vein could then be transected allowing the specimen to be removed.  The  specimen with the ureter intact was dissected all the way down towards  the pelvis but at that point, it narrowed down quite narrowly and became  somewhat thin.  The area was transected but the stump of the ureter was  left with the double-J catheter in place.  The sharp dissection was used  to dissect this off the underlying iliac vessels and this extended as  far as possible where it became clear that the bowel needed to be completely mobilized in order to get at the distal ureter.  There were  marked adhesions in the area and it  was noted that there was a bowel  injury from a retractor.  General surgery consultation was sought.  The  patient was seen in consultation by Dr. Lurene Shadow who mobilized the bowel  and that repaired the enterotomy.  He elected to  do this with  bowel  resection and will dictate that portion of procedure.  Once the bowel  had been mobilized fully it became clear that the ureter could be  dissected all way down to the level of the Hartmann's pouch.  It was  hard to identify the pouch itself, however, due to the marked adhesions.  Dr. Lurene Shadow placed a finger within the rectum and could actually feel the  retractors and we could identify the Hartmann's pouch in this fashion.  There really did not appear to be a clear cut hole in the Hartmann's  pouch, but some of the staples could be identified.  The ureter was  freed all way down to the level of the bladder where it was transected.  The leak from the Boari flap could be identified.  This was oversewn  with several sutures of 3-0 Vicryl.  As an aid to identification of the  hole the Foley catheter was filled with methylene blue containing fluid  and this was run in under slight pressure.  The leak was easily  identified in this fashion, but after closure no leak could be  identified.  It was still fell however, the patient should have the  catheter in place until she is ready to go and then a cystogram would  need to be performed in order to ensure there is no fistulization or  leak.  A drain was placed in there in order to fully divert any urinary  leak that might be present.  The drain was brought out through a  separate stab incision and held in place with 2-0 Vicryl.  The bowel was  inspected.  Everything appeared to be intact.  The area was irrigated  with antibiotic solution then drained.  The incision was closed in three  layers with #1 Vicryl in the first layer and #1 PDS  on the second and third layers.  The skin was closed with  surgical  staples.  The patient tolerated procedure well and was taken to recovery  in good condition.  She will have NG tube JP drain and Foley catheter  drainage and will not be extubated immediately, but hopefully to be  extubated later this evening.           ______________________________  Jamison Neighbor, M.D.  Electronically Signed     RJE/MEDQ  D:  09/17/2006  T:  09/18/2006  Job:  [id number]   cc:   Anselm Pancoast. Zachery Dakins, M.D.  [address], [address]  [place]  [place] [address]   Leonie Man, M.D.  [address]  [address]  [place]  [place] [address]

## 2010-08-22 NOTE — Discharge Summary (Signed)
NAMEKEYDI, GIEL NO.:  [id number]   MEDICAL RECORD NO.:  [id number]          PATIENT TYPE:  INP   LOCATION:  1422                         FACILITY:  [facility]   PHYSICIAN:  Jamison Neighbor, M.D.  DATE OF BIRTH:  January 10, 1941   DATE OF ADMISSION:  09/17/2006  DATE OF DISCHARGE:  09/25/2006                               DISCHARGE SUMMARY   DISCHARGE DIAGNOSES:  1. Postoperative fistula.  2. Pyelonephritis.  3. Esophageal reflux.  4. Anxiety.  5. Peritoneal adhesions.  6. Anemia.   PRINCIPAL PROCEDURE:  1. Nephroureterectomy.  2. Repair of ureteral fistula.  3. Small bowel resection, which was done by Dr. Dominga Ferry.   HISTORY:  This 70 year old female has a complicated urologic history.  The patient has severe, diverticular disease and had a laparoscopic  colon resection performed.  The patient had a ureteral injury which was  repaired, initially laparoscopically.  This broke down; and the patient  had a Boari flap repair done by Dr. Marcine Matar.  The patient  subsequently developed wound breakdown; and had urine exiting the wound.  The patient has had nephrostomy tubes in place; and this area has not  healed.  She has undergone evaluation; and it is clear that the best  option for her is the nephrectomy on that side.  She is to undergo  exploration and nephrectomy.   PAST MEDICAL HISTORY:  Remarkable for diverticular disease, asthma,  arthritis, esophageal reflux disease, and anxiety.   MEDICATIONS:  Listed on the initial history and physical, which is well  delineated in the initial dictation.   HOSPITAL COURSE:  The patient was taken to the operating room where she  was explored through the flank.  The kidney was identified and easily  mobilized.  She had extensive lysis of adhesions; and for that reason,  Dr. Lurene Shadow was asked to come into the operating room to help with that  repair.  A small section of small bowel was removed in order to take  care of a small bowel injury.   The patient's postoperative course was unremarkable.  She was kept in  the intensive care unit.  She was started on TNA.  Dr. Lurene Shadow helped to  manage her bowel issues. By June 12 postoperative day #2 the NG-tube was  removed.  The patient did have some incisional pain; was transferred to  the floor.  She eventually was advanced to a regular diet.  JP drainage  was minimal and that was eventually removed. She was given some blood  transfusion, because she became a little hypertensive and did much  better following the transfusion.  The patient did undergo a cystogram.  There was no sign of a leak..  For that reason the JP was drained; the  Foley catheter was drained; staples were removed; and she was prepared  for discharge.   The patient seemed to do well.  She was sent home on Macrobid and Tylox;  and is to return to see me in the office in one week.      Jamison Neighbor, M.D.  Electronically Signed  RJE/MEDQ  D:  10/31/2006  T:  11/01/2006  Job:  [id number]

## 2010-08-22 NOTE — Op Note (Signed)
NAMEZAMYRA, ALLENSWORTH NO.:  [id number]   MEDICAL RECORD NO.:  [id number]          PATIENT TYPE:  INP   LOCATION:  2899                         FACILITY:  [facility]   PHYSICIAN:  Ollen Gross. Vernell Morgans, M.D. DATE OF BIRTH:  02-Jul-1940   DATE OF PROCEDURE:  05/21/2007  DATE OF DISCHARGE:                               OPERATIVE REPORT   PREOPERATIVE DIAGNOSES:  1. Colostomy.  2. Ventral and parastomal hernias.   POSTOPERATIVE DIAGNOSES:  1. Colostomy.  2. Ventral and parastomal hernias.   PROCEDURES:  1. Exploratory laparotomy.  2. Extensive lysis of adhesions.  3. Colostomy takedown with a low EEA 29-mm stapled anastomosis.  4. Ventral hernia repair with a 24 x 12-cm MTF mesh.   SURGEON:  Ollen Gross. Vernell Morgans, M.D.   ASSISTANT:  Adolph Pollack, M.D.   ANESTHESIA:  General endotracheal.   PROCEDURE:  After informed consent was obtained, the patient was brought  to the operating and placed in supine position on the operating room  table.  After adequate induction of general anesthesia, the patient's  abdomen was prepped with Betadine and draped in the usual sterile  manner.  The patient was placed in lithotomy position.  Prior to  prepping and draping, the patient's ostomy was closed with a 2-0 silk  pursestring stitch.  Next a midline incision was made with a 10 blade  knife in the lower portion of the incision.  The incision was then made  around her old scar to excise the scar.  This incision was carried down  through the skin and subcutaneous tissue sharply with electrocautery  until the fascia of the anterior abdominal wall was encountered.  This  fascia was opened sharply with the electrocautery.  The preperitoneal  space was probed bluntly with a hemostat until access was gained to the  abdominal cavity.  We were then able to open the rest of the incision  under direct vision with the electrocautery.  The patient had some small  bowel and omental adhesions  to the anterior abdominal wall as well as  between loops of intestine, and this was all taken down sharply by a  combination of sharp dissection with the cautery and sharp dissection  with Metzenbaum scissors.  Care was taken not to cause any injury to the  intestines.  The small bowel was able to be freed up and then brought  out of the pelvis.  We then examined the pelvis.  The rectal stump was  able to be identified.  We were able to do a rigid sigmoidoscopy and we  were able to reach the staple line with the end of the rigid  sigmoidoscope without difficulty.  The stump appeared to be healthy,  although it was very tethered to the pelvis from previous surgery.  It  was not mobile but again, we had a good look at the staple line and the  tissue appeared to be healthy.  At this point we then took down the  colostomy.  An elliptical incision was made at the skin level around the  ostomy.  The  ostomy was then dissected free circumferentially using the  electrocautery.  Sharp dissection with the cautery and blunt finger  dissection from inside the abdomen was then able to free up the  colostomy.  The colostomy was then able to be brought through the  opening into the abdominal cavity again and the rest of the ostomy was  freed up sharply with the electrocautery.  The left colon was then  mobilized by incising its retroperitoneal attachment along the white  line of Toldt as well as by removing more adhesions of omentum and colon  to the small bowel.  This was all done sharply with the electrocautery  and with Metzenbaum scissors.  Once we did this, we had plenty of length  on the descending colon.  We excised the last few centimeters of the  colon back to healthy-appearing colon and the mesentery at this segment  was taken down by clamping with Kelly clamps, dividing and ligating the  mesentery at the end of the descending colon.  The descending colon end  was then opened by removing the  Allen clamp that was used to clamp the  end prior to transection with a 10 blade knife.  EEA sizers were then  used to measure the descending colon end and a 29 fit very well.  A 2-0  Prolene pursestring stitch was then run around the edge of the colon.  The anvil of the 29 EEA stapler was then placed in the colon and the  pursestring stitch was cinched down and tied.  Next Dr. Abbey Chatters went  below and was able to bring the EEA stapling device through the rectum  to the staple line.  Once the stapling device was in good position, the  spike was advanced through the wall of the rectal stump where the staple  line was.  The anvil was then placed on the spike.  The anvil and spike  device was then closed until we were in the middle of the green section.  The EEA stapler was then fired creating a nice, widely patent  enteroenterostomy.  A smooth bowel clamp was then placed above this  level on the descending colon.  The pelvis was then filled with saline.  Dr. Abbey Chatters then did another rigid sigmoidoscopy and was able to  insufflate the rectosigmoid area.  Initially there were some small  bubbles coming from the left side of the anastomosis, although on  repeated attempts at visualizing this area no further bubbles could be  appreciated.  It was not clear at this point whether there was a small  leak between the staples on the staple line, although the staple line  appeared to be healthy and intact.  At this point the staple line on the  rectosigmoid was then reinforced with many 3-0 silk stitches.  Once this  was accomplished, the anastomosis looked good without any tension.  There did not appear to be any further evidence of leak from the staple  line.  The abdomen was then irrigated with copious amounts of saline.  The old ostomy site was then closed with interrupted #1 Novofil  stitches.  Some cutaneous flaps on top of the anterior abdominal wall  fascia were then created  circumferentially around the opening.  The  hernia sac was excised sharply with the electrocautery.  It was clear  that the fascial edges were not going to come together very well.  A  piece of 24 x 12-cm MTF mesh was chosen.  This mesh was sewn as an  underlay with interrupted #1 Novofil stitches circumferentially around  the fascial defect several centimeters back from the defect.  Once this  was accomplished circumferentially, care was taken to make sure no  visceral contents had gotten trapped up under the MTF and it all looked  good and smooth.  The lower portion of the fascia of the anterior  abdominal wall was able to be closed with interrupted #1 Novofil  stitches, but the superior portion of the fascial defect would not come  together but was well-covered by the underlay of MTF.  At this point the  subcutaneous tissue was irrigated with copious amounts of saline.  The  two 19-French round Blake drains were brought through two stab incisions  laterally on the lower portion of the abdominal wall and laid within the  subcutaneous space.  These drains were anchored to the skin with 3-0  nylon stitches.  The subcutaneous tissue at the ostomy and midline  incision was then closed with running 2-0 Vicryl stitches and then the  skin was closed with staples after irrigating the skin with copious  amounts of saline.  The drains were placed to bulb suction.  Sterile  dressings were applied.  The patient tolerated the procedure well.  At  the end of the case all needle, sponge and instrument counts were  correct.  The patient was then awakened, taken to recovery in stable  condition.  The time of the case took approximately 4 hours and was very  difficult.      Ollen Gross. Vernell Morgans, M.D.  Electronically Signed     PST/MEDQ  D:  05/21/2007  T:  05/22/2007  Job:  [id number]

## 2010-08-22 NOTE — Op Note (Signed)
[name], [name]:  [id number]   MEDICAL RECORD NO.:  [id number]          PATIENT TYPE:  INP   LOCATION:  1228                         FACILITY:  [facility]   PHYSICIAN:  Leonie Man, M.D.   DATE OF BIRTH:  09/24/1940   DATE OF PROCEDURE:  09/17/2006  DATE OF DISCHARGE:                               OPERATIVE REPORT   PREOPERATIVE DIAGNOSIS:  Extensive pelvic adhesions with enterostomy.   POSTOPERATIVE DIAGNOSIS:  Extensive pelvic adhesions with enterostomy.   PROCEDURE:  Extensive adhesiolysis with small bowel resection.   SURGEON:  Mardene Celeste. Lurene Shadow, M.D.   ASSISTANT:  Jamison Neighbor, M.D.   ANESTHESIA:  General.   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  Prior to my  entering the operating field, she had undergone nephrectomy and in the  process of dissecting out the ureter from extensive pelvic adhesions, an  enterostomy was created.  At the time I entered the operative field, the  ureter had been fully dissected up and there was extensive small bowel  pelvic adhesions deep within the pelvis stuck down to a [employer].   Upon entering the operative field, I proceeded to dissect the small  bowel out of the pelvis.  This was a rather extensive adhesiolysis  lasting approximately 90 minutes.  Upon dissecting the small bowel out  of the pelvis, there were two areas of enterostomy.  These were resected  and end-to-end anastomosis then created with GIA staples and TA-60  stapling devices with closure of the intervening mesentery with 3-0 silk  sutures.  Both anastomoses were noted to be widely patent and there was  no ischemia at the suture lines.   At that point, I left the operative field for Dr. Logan Bores to complete his  operation.      Leonie Man, M.D.  Electronically Signed     PB/MEDQ  D:  09/17/2006  T:  09/17/2006  Job:  [id number]   cc:    Jamison Neighbor, M.D.  Fax: [phone]

## 2010-08-23 ENCOUNTER — Ambulatory Visit
Admission: RE | Admit: 2010-08-23 | Discharge: 2010-08-23 | Disposition: A | Discharge status: Home / Self Care | Payer: Medicare Other | Source: Ambulatory Visit | Attending: Surgery | Admitting: Surgery

## 2010-08-23 DIAGNOSIS — R110 Nausea: Secondary | ICD-10-CM

## 2010-08-23 MED ORDER — IOHEXOL 300 MG/ML  SOLN
125.0000 mL | Freq: Once | INTRAMUSCULAR | Status: AC | PRN
  Administered 2010-08-23: 125 mL via INTRAVENOUS

## 2010-08-25 NOTE — Discharge Summary (Signed)
[name], [name]:  [id number]   MEDICAL RECORD NO.:  [id number]          PATIENT TYPE:  IPS   LOCATION:  0405                          FACILITY:  [facility]   PHYSICIAN:  Anselm Jungling, MD  DATE OF BIRTH:  [birth year]   DATE OF ADMISSION:  10/07/2004  DATE OF DISCHARGE:  10/17/2004                                 DISCHARGE SUMMARY   IDENTIFYING INFORMATION:  This is a 70 year old unmarried Caucasian female.   CHIEF COMPLAINT:  The patient was admitted to [facility]  inpatient psychiatric service on a petition, that her sister had arrange the  day prior to the date of admission, October 07, 2004.  The patient has a history  of recurring delusional disorder.  She had a history of previous  hospitalization here at the [facility] in October and November  of 2005.  She had also had prior inpatient psychiatric admissions for this  at [facility] and [facility].  She had been  treated for this delusional disorder for over 30 years.  Her delusional  system involves having over 40 children.  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  She has been acting out on these delusions, approaching children,  telling them that she is their mother.  Because of behaviors like these, a  restraining order has been issued against her, at the behest of her local  church.  Upon admission it was reported that she had not been paying her  bills, and had been spending large amounts of money on clothing for the  nonexistent children.  She had not been taking to her own self-care.  In the  past, she had apparently been treated with antipsychotic medications, and  although treatment with those did not necessarily remove her delusional  system, she was not prone at those times to  acting out on her delusions and  was able to function reasonably well.   She also presented with longstanding delusions that was married to a certain  psychologist, allegedly a Community education officer.  At that time of her admission to  [facility], she was not taking any psychotropic  medications.  She is not known to have a history of alcohol or drug abuse.   PAST MEDICAL HISTORY:  The patient is reported to have a history of  hypothyroidism, GERD, and diverticulitis.   PRIMARY CARE PHYSICIAN:  Her most recent primary care physician was Dr.  Donneta Romberg.   ALLERGIES:  She has no known drug allergies.   ADMISSION DIAGNOSES:   AXIS I:  Delusional disorder.   AXIS II:  Deferred.   AXIS III:  1.  History of hypothyroidism.  2.  Gastroesophageal reflux disease.  3.  Diverticulitis.   AXIS IV:  Stressors severe.   AXIS V:  25.   LABORATORY AND X-RAY FINDINGS:  The patient's admission laboratory was  essentially within normal limits.  This included a comprehensive metabolic  panel, a CBC, and a vitamin B12 level.  She had no abnormalities in her  thyroid function test, TSH measuring 0.787.  RBCs were slightly increased at  5.19 which was not felt to be clinically significant.  Her toxicology urine  screen was negative.   HOSPITAL COURSE:  The patient presented as a well-nourished, normally  developed adult female with good grooming and hygiene.  She was oriented in  all spheres and understood the reason for her hospitalization although she  indicated consistently that she disagreed with needing to be  hospitalization, although she indicated consistently that she disagreed with  needing to be hospitalized psychiatrically.  She spoke on a regular basis  throughout her inpatient stay of her belief that she did indeed have many  children that needed her care.  She also made reference at times to needing  to greater than out of the hospital so that she could find some of  these  children who are lost, and assume care of them.   The patient was aware that she was hospitalized because of concern that her  belief system was delusional in nature; however, when the undersigned spoke  to the patient about this, she became irritable and defensive, and accused  the undersigned of lying to her about this, and/or others having lied to the  undersigned about this information.  She told me very bluntly that the  notion of her having psychiatric disorder and delusionality has been proven  false.  She continued to insist that she did not need psychiatric  hospitalization and refused to consider the possibility of voluntary  antipsychotic medication treatment.   Initially, medication was ordered for her as follows, Risperdal 0.25 mg  b.i.d. and Risperdal 1.5 mg p.o. q.h.s., Ativan 1 mg p.o. q.h.s., and Ativan  and Cogentin ordered on a p.r.n. basis.  The patient consistently refused  these and was completely unwilling to consider taking them during her stay.   By the sixth hospital day, the patient was still refusing such medication.  A second opinion was obtained by Dr. Aleatha Borer, the behavioral health  center medical director.  He concurred with the need for antipsychotic  medication based on the patient's several delusional system, and her recent  history of acting out on those delusions in ways that count easily be  construed to be a danger within the community.  At this point, Risperdal IM  Consta 25 mg was ordered which was administered on October 13, 2004, the sixth  hospital day.  At the same time, oral Risperdal M-tab 1 mg q.h.s. was  ordered with and instruction to give Haldol 2 mg IM instead if the patient  refused p.o. medication.   Subsequently the Haldol substitution was replaced by substitution of Zyprexa  5 mg IM out of concern that the patient might be intolerant to Haldol.  The patient continued to refuse oral Risperdal, and subsequently, received   doses of Zyprexa IM 5 mg.  All the medications administered appeared to be  well tolerated without any adverse affects.   The patient continued to disagree with being given medication but understood  the reasons and rationale for giving the medication, even if she did not  agree with it.   Following institution of such medication, she commented at one point that  the medication made her feel like she had had two margaritas. but she had  no specific complaints and reported  no adverse affects.  She did not appear  to be sedated in the least by administration of these medications.   On the 11th hospital day, the patient has established a pattern of  continuing refusal of her oral antipsychotic medication.  As such, it was  felt that she was going to need a longer period of inpatient psychiatric  treatment, and that this would be best provided at an alternative facility.  Although the patient had received a dose of long-acting Risperdal by  injection on October 13, 2004, that medication would not have become bioactively  effective for approximately 3 weeks following.  This means that if the  patient had been discharged from our care, she certainly would have avoided  taking prescribed oral medications, and therefore would have been  essentially unmedicated from the time of discharge to approximately 3 weeks  following her Risperdal Consta injection, or, roughly November 03, 2004.  It was  felt that this would lead to further deterioration of her mental condition,  with the hazards associated.  As such, efforts were made to secure more long-  term hospitalization at [facility].  [facility] agreed  to accept the patient in transfer, on a new involuntary petition prepared by  the undersigned.   CONDITION ON DISCHARGE:  At the time discharge, the patient was still  delusional but in firm denial of her beliefs delusional.  She continued to  perceive herself as not having a mental  illness and not being in need to  treatment.  She was not in agreement with the plan to transfer to [facility].   POST-HOSPITAL CARE PLANS:  Per [facility] treatment plan,  aftercare plan to be developed by [facility] Clinical Staff.   FINAL DIAGNOSES:   AXIS I:  Delusional disorder.   AXIS II:  Deferred.   AXIS III:  1.  History of hypothyroidism.  2.  Gastroesophageal reflux disease.  3.  Diverticulitis.   AXIS IV:  Stressors severe.   AXIS V:  Global assessment of function on discharge 25.     _______________    SPB/MEDQ  D:  10/17/2004  T:  10/17/2004  Job:  [id number]

## 2010-08-25 NOTE — Op Note (Signed)
NAMEKENIDEE, CREGAN NO.:  [id number]   MEDICAL RECORD NO.:  [id number]          PATIENT TYPE:  INP   LOCATION:  1618                         FACILITY:  [facility]   PHYSICIAN:  Bertram Millard. Dahlstedt, M.D.DATE OF BIRTH:  01-17-1941   DATE OF PROCEDURE:  07/20/2006  DATE OF DISCHARGE:                               OPERATIVE REPORT   PREOPERATIVE DIAGNOSIS:  Acute abdomen post sigmoid colectomy and  ureteral repair.   POSTOPERATIVE DIAGNOSIS:  Breakdown of the ureteral anastomosis.   PROCEDURE:  Ureteral exploration, Boari flap ureteral reconstruction,  left salpingo-oophorectomy.   SURGEON:  Bertram Millard. Dahlstedt, M.D.   FIRST ASSISTANT:  Anselm Pancoast. Zachery Dakins, M.D.   COMPLICATIONS:  None.   BRIEF HISTORY:  This 70 year old female had a laparoscopic hand-assisted  sigmoid colectomy for diverticulitis four days ago.  She developed an  acute abdomen and was found to have an anastomotic leak of her colon  anastomosis.  I was called to the operating room by Dr. Jamey Ripa and D.  Weatherly today, as in their re-exploration they found that the ureteral  anastomosis had been broken down.   DESCRIPTION OF PROCEDURE:  The patient is colocolostomy had been taken  down prior to my presentation in the operating room.  It was evident  that the ureteral anastomosis was several centimeters apart with the  stent still indwelling.  Inspection of the ureteral ends revealed some  fibrinous material on the ends and some devascularized tissue.  I  freshened the ends up and removed approximately 4 mm of tissue from  each end.  It was evident at this point that, although the ureter could  be brought together, it would not be a tension-free anastomosis.  At  this point, I considered alternatives - left nephrectomy versus left to  right transureteroureterostomy versus Boari flap reconstruction versus  psoas hitch.  It was evident that due to the significant inflammation in  the right  retroperitoneal pelvic area, that her ureteral expiration will  be somewhat difficult.  It was also evident that, if the patient was  going to have a colostomy takedown in the future, that I should not use  a psoas hitch, as this would obscure the rectal stump.  I then felt that  a Boari flap would be the most advantageous reconstruction for the left  ureter.  The bladder was freed up on the right lateral leaf with  electrocautery.  I then constructed a strip of bladder, with it being  based on the left dome, with the strip being about a centimeter and a  half wide, incising the serosa, muscularis mucosa from the left dome to  the right base of the bladder.  This easily flipped a 6-cm piece of  bladder up that I could easily used to reconstruct the ureter.  I  tubularized the ureter using running 2-0 Vicryl such that it would  easily admit the stent which had previously been placed.  The entire  strip was tubularized using two separate layers of running 2-0 Vicryl.  The native ureter had been spatulated.  I then performed an anastomosis  of the ureter to the neoureter from the bladder using interrupted 4-0  PDS sutures.  This achieved a nice tension free anastomosis.  Approximately 10 sutures were placed for this anastomosis.  It should be  noted that the native distal ureter was ligated with 2-0 Vicryl tie.  The lower part of the bladder incision was also closed with two separate  running 2-0 Vicryls.  This formed a nice closure.  The stent had been  brought from the proximal ureter down through this Boari flap.  It was  brought down into the bladder.  The ureter was brought over to the left  pelvic sidewall as much as possible, exposing the rectal stump  underneath for an easier reanastomosis at a later date.  A catheter was  left indwelling.   At this point, Dr. Zachery Dakins performed the closure of the patient's  abdomen as well as a colostomy.  This will be dictated in a separate   note.      Bertram Millard. Dahlstedt, M.D.  Electronically Signed     SMD/MEDQ  D:  07/20/2006  T:  07/20/2006  Job:  [id number]
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
## 2010-08-25 NOTE — Discharge Summary (Signed)
[name], WEINHEIMER NO.:  [id number]   MEDICAL RECORD NO.:  [id number]          PATIENT TYPE:  INP   LOCATION:  5124                         FACILITY:  [facility]   PHYSICIAN:  Ollen Gross. Vernell Morgans, M.D. DATE OF BIRTH:  1940/08/16   DATE OF ADMISSION:  07/21/2007  DATE OF DISCHARGE:  07/28/2007                               DISCHARGE SUMMARY   HISTORY:  Ms. Schraeder is a 70 year old white female who is about 2  months out from a colostomy takedown and complex ventral hernia repair.  This was complicated by a small intra-abdominal abscess that was  percutaneously drained.  She did well for a couple of weeks, but now  returns feeling a little bit worse to the clinic.  She was readmitted to  the hospital where she was started on ceftazidime.  A repeat CT scan  showed subcutaneous abscess, but the intra-abdominal abscess had  resolved.  Her diet was slowly advanced and was scheduled with  interventional to place a percutaneous drain into the subcutaneous space  which they did.  She began to feel better after the drain was placed.  She slowly improved.  Physical therapy was also consulted for some  deconditioning to help her with her strength.  Her white count quickly  returned to normal; and on July 28, 2007, she was ready for discharge  to home.  Her drain was left in.  Her diet was as tolerated.  Activities  as tolerated.  Home health nursing was arranged to help care for her  drain.   FINAL DIAGNOSES:  Subcutaneous abscess after colostomy takedown and  ventral hernia repair.   FOLLOWUP:  Followup would be with Dr. Carolynne Edouard in 1 week, and her condition  is stable, and she is discharged home.      Ollen Gross. Vernell Morgans, M.D.  Electronically Signed     PST/MEDQ  D:  09/02/2007  T:  09/03/2007  Job:  [id number]

## 2010-08-25 NOTE — Op Note (Signed)
NAME:  [name], [name]                        ACCOUNT NO.:  [id number]   MEDICAL RECORD NO.:  [id number]                   PATIENT TYPE:  OBV   LOCATION:  0457                                 [facility]ACILITY[facility]  [facility]   PHYSICIAN:  Ollen Gross. Vernell Morgans, M.D.              DATE OF BIRTH:  [birth year]   DATE OF PROCEDURE:  03/25/2003  DATE OF DISCHARGE:  03/26/2003                                 OPERATIVE REPORT   PREOPERATIVE DIAGNOSIS:  Gallstones.   POSTOPERATIVE DIAGNOSIS:  Gallstones.   PROCEDURE:  Laparoscopic cholecystectomy with intraoperative cholangiogram.   SURGEON:  Ollen Gross. Carolynne Edouard, M.D.   ASSISTANT:  Rose Phi. Maple Hudson, M.D.   ANESTHESIA:  General endotracheal.   DESCRIPTION OF PROCEDURE:  After informed consent was obtained, the patient  was brought to the operating room and placed in the supine position on the  operating room table, where after adequate induction of general anesthesia  the patient's abdomen was prepped with Betadine and draped in the usual  sterile manner. The area above the umbilicus was infiltrated with 0.25%  Marcaine.  A small incision was made with a 15 blade knife.  This incision  was carried down through the subcutaneous tissue bluntly with Kelly clamps  and [employer]y retractors until the linea alba was identified.  The linea  alba was incised with a 15 blade knife and each side was grasped with Kocher  clamps and elevated anteriorly.  The preperitoneal space was then probed  bluntly with a hemostat until the peritoneum was opened and access was  gained to the abdominal cavity.  A 0 Vicryl pursestring stitch was placed in  the fascia surrounding the opening.  A Hasson cannula was placed through the  opening and anchored in place with the previously-placed Vicryl pursestring  stitch.  The abdomen was then insufflated with carbon dioxide without  difficulty.  The patient was placed in the head-up position.  The  laparoscope was placed through the Hasson cannula  and the right upper  quadrant was inspected and the dome of the gallbladder and liver were  readily identified.  Next the epigastric region was infiltrated with 0.25%  Marcaine.  A small incision was made with a 15 blade knife and a 10 mm port  was placed bluntly through this incision into the abdominal cavity under  direct vision.  Sites were then chosen laterally on the right side of the  abdomen for placement of 5 mm ports.  Each of these areas was infiltrated  with 0.25% Marcaine and small stab incisions were made with a 15 blade  knife.  Five millimeter ports were placed bluntly through these incisions  into the abdominal cavity under direct vision.  A blunt grasper was placed  through the lateralmost 5 mm port and used to grasp the dome of the  gallbladder and elevate it anteriorly and superiorly.  Another blunt grasper  was placed through the other  5 mm port and used to retract on the body and  neck of the gallbladder.  The dissector was placed through the epigastric  port and using the electrocautery, the peritoneal reflection of the  gallbladder neck was opened.  Blunt dissection was then carried out in this  area until the gallbladder neck-cystic duct junction was readily identified  and a good window was created.  A small ductotomy was made with the  laparoscopic scissors and a single clip was placed on the gallbladder neck.  A 14-gauge Angiocath was then placed percutaneously through the anterior  abdominal wall under direct vision.  A Reddick cholangiogram catheter was  placed through the Angiocath and flushed.  The Reddick catheter was then  placed within the cystic duct and anchored in place with a clip.  A  cholangiogram was obtained that showed no filling defects, good length on  the cystic duct, and rapid emptying in the duodenum.  The anchoring clip and  catheters were then removed from the patient.  Three clips were placed  proximally on the cystic duct and the duct was  divided between the two sets  of clips.  Posterior to this the cystic artery was identified and again  dissected circumferentially in a blunt manner until a good window was  created.  Two clips were placed proximally and one distally on the artery  and the artery was divided between the two.  Next a laparoscopic hook  cautery device was used to separate the gallbladder from the liver bed prior  to completely detaching the gallbladder from the liver bed.  The liver bed  was inspected and several small bleeding points were coagulated with the  electrocautery.  The gallbladder was then detached the rest of the way from  the liver bed without difficulty.  The abdomen was then irrigated with  copious amounts of saline.  The liver bed was inspected again and found to  be hemostatic.  The laparoscope was removed to the epigastric port and a  gallbladder grasper was placed through the Hasson cannula and used to grasp  the neck of the gallbladder.  The gallbladder was then removed with the  Hasson cannula through the supraumbilical port without difficulty.  The  fascial defect was closed with the previously-placed Vicryl pursestring  stitch as well as with an interrupted 0 Vicryl stitch.  The rest of the  ports were removed under direct vision and were all found to be hemostatic.  The gas was allowed to escape.  The skin incisions were all closed with  interrupted 4-0 Monocryl subcuticular stitches.  Benzoin and Steri-Strips  were applied.  The patient tolerated the procedure well.  At the end of the  case all needle, sponge, and instrument counts were correct.  The patient  was then awakened and taken to the recovery room in stable condition.                                               Ollen Gross. Vernell Morgans, M.D.    PST/MEDQ  D:  04/05/2003  T:  04/05/2003  Job:  [id number]

## 2010-08-25 NOTE — Procedures (Signed)
[place]  Patient:    [name], [name]                       MRN: [id number] Proc. Date: 06/26/00 Attending:  Verlin Grills, M.D. CC:         Barbette Hair. Vaughan Basta., M.D.   Procedure Report  INDICATIONS:  [name] (date of birth Apr 19, 1940) is a 70 year old female.  She is referred for surveillance colonoscopy and polypectomy to prevent colon cancer.  Intermittently she has nocturnal episodes of intense rectal pain, but denies gastrointestinal bleeding.  She has a history of iron deficiency anemia and no signs of colorectal neoplasia by colonoscopy performed in 1999.  I discussed with [name] the complications associated with colonoscopy and polypectomy including intestinal bleeding and intestinal perforation.  Ms. Trachtenberg has signed the operative permit.  ENDOSCOPIST:  Verlin Grills, M.D.  PREMEDICATION:  Fentanyl 100 mcg, Versed 10 mg.  ENDOSCOPE:  Olympus pediatric colonoscope.  DESCRIPTION OF PROCEDURE:  After obtaining an informed consent, the patient was placed in the left lateral decubitus position.  I administered intravenous fentanyl and intravenous Versed to achieve conscious sedation for the procedure.  The patients blood pressure, oxygen saturation, and cardiac rhythm were monitored throughout the procedure and documented in medical record.  The anal inspection was normal.  Digital rectal examination was normal.  The Olympus pediatric video colonoscope was introduced into the rectum and under direct vision and advanced to the cecum which was identified by normal appearing ileocecal valve.  Colonic preparation for the examination today was excellent.  [name] does have universal colonic diverticulosis most extensive in the left colon, but no signs of diverticulitis or diverticular stricture.  RECTUM:  Normal.  SIGMOID COLON AND DESCENDING COLON:  Normal.  SPLENIC FLEXURE:   Normal.  TRANSVERSE COLON:  Normal.  HEPATIC FLEXURE:  Normal.  ASCENDING COLON:  Normal.  CECUM AND ILEOCECAL VALVE:  Normal.  ASSESSMENT: 1. Universal colonic diverticulosis. 2. Otherwise normal proctocolonoscopy to the cecum.  No endoscopic evidence    for the presence of colorectal neoplasia.  RECOMMENDATIONS:  Ms. Koerber should undergo a repeat colonoscopy in approximately 10 years. DD:  06/26/00 TD:  06/26/00 Job: [id number]

## 2010-08-25 NOTE — Op Note (Signed)
NAMEDELANY, [name]:  [id number]   MEDICAL RECORD NO.:  [id number]          PATIENT TYPE:  INP   LOCATION:  X001                         FACILITY:  [facility]   PHYSICIAN:  Ardeth Sportsman, MD     DATE OF BIRTH:  [birth year]   DATE OF PROCEDURE:  07/16/2006  DATE OF DISCHARGE:                               OPERATIVE REPORT   PRIMARY CARE PHYSICIAN:  Wilson Singer, M.D.   GASTROENTEROLOGIST:  Wilhemina Bonito. Marina Goodell, M.D.   UROLOGIST:  Jamison Neighbor, M.D.   PREOPERATIVE DIAGNOSES:  Recurrent left-sided diverticulitis with  significant diverticulosis.   POSTOPERATIVE DIAGNOSES:  1. Recurrent left-sided diverticulitis with significant      diverticulosis.  2. Left ureteral injury.   PROCEDURE:  1. Laparoscopically-assisted left hemicolectomy with #33 EEA      anastomosis.  2. Splenic flexure immobilization of transverse colon.  3. Rigid proctoscopy.  4. Laparoscopically-assisted ureteral ureterostomy repair over a      double-J stent.  (I assisted Dr. Marcelyn Bruins).  5. Cystoscopy with double-J stent by Dr. Marcelyn Bruins.   ANESTHESIA:  1. General anesthesia.  2. Local anesthetic and a field block around all port sites especially      at the fascia and skin of the Pfannenstiel incision.   DRAINS:  A 15-French Blake drain, tip in the pelvis and lays over the  left ureter in the left pericolic gutter.   ESTIMATED BLOOD LOSS:  Was 400 mL.   COMPLICATIONS:  A left ureteral injury, which was identified intra-  operatively and repaired intra-operatively.  See Dr. Marcelyn Bruins' note  for cystoscopy and ureteral repair.   INDICATIONS FOR PROCEDURE:  [name] is a 70 year old female with  recurrent episodes of diverticulitis, who has been followed by Dr. Ollen Gross. Toth III.  Unfortunately, Dr. Carolynne Edouard suddenly is not available for the  next six weeks.  She has had recurrent episodes and is on chronic oral  antibiotics.  A discussion was made with her, and she  wished to have a  surgical repair done now and not delay until Dr. Carolynne Edouard was available.  I  was available to do surgery, and she expressed satisfaction with me  assuming surgical care.   The anatomy and physiology of the digestive tract was explained.  The  pathophysiology of diverticulosis and diverticulitis was explained.  The  technique of laparoscopically-assisted colectomy was discussed.  The  risks such as stroke, heart attack, deep vein thrombosis, pulmonary  embolism and death were discussed.  The risks such as bleeding, need for  transfusion, wound infection, abscess or injury to organs such ureter or  bowel, the need for a colostomy, postoperative incision wound pain or  leak and other risks were discussed.  Questions were answered and she  agreed to proceed.   FINDINGS:  She had significant diverticulosis of her entire left colon,  all the way up to close to the splenic flexure.  She had a significant  amount of inflammation in her descending colon and proximal sigmoid  colon as well.  There was no evidence of any  active abscess.  She had  dense adhesions over her left pelvic brim near her left ovary and  fallopian tube.  This is where the left ureter injury occurred.   DESCRIPTION OF PROCEDURE:  An informed consent was obtained.  The  patient received a bowel prep and got preoperative hydration.  She  received 1 gram IV Invanz preoperatively.  She had sequential  compression devices active during the entire case.  She underwent  general anesthesia without difficulty.  She was supine and in the low  lithotomy with arms tucked.  She had a Foley catheter placed.  Her  abdomen was cleaned, prepped and draped in a sterile fashion.   Entry was gained in the abdomen through optical entry through a right  upper quadrant stab incision using a 5 mm scope with the patient in a  steep Trendelenburg with the right side up.  Capnoperitoneum to 15 mm/Hg  provided good abdominal  insufflation.  There was no evidence of any  significant intra-abdominal adhesions at the site of her prior  operations.  Under direct visualization a 5 mm port was placed in the  left mid-abdomen, a 10 mm port through the umbilicus and a 12 mm port  was placed in the right lower quadrant.   An 8 cm Pfannenstiel incision was created a couple of finger breadths  above the pubic bone and a Gel-port was placed.  The patient was  positioned in a steep Trendelenburg with her right side down.  The small  bowel and greater omentum were reflected cephalad out of the way.  There  was a moderate amount of attachments in the sigmoid and descending colon  to the lateral side.  I was able to elevated the sigmoid mesentery and  score it on the right side, just 1 cm above the sacral promontory.  This  was followed cephald to the ligament of Treitz.  The ureter on the left  side could immediately be evaluated, and care was made to keep it as  posterior as possible.  Ultimately I got into a retro-mesenteric window  (Toldt's fascia).  I was able to sweep free the sigmoid and descending  colon and the distal half of the transverse colon off the posterior  attachments, primarily bluntly as well as with controlled ligature.  Given the extensive disease of the descending colon as well, I felt more  extensive colonic mobilization was needed.  Therefore, the inferior  mesenteric artery was identified at its base and was ligated using Liga  Sure just about 2 cm from its junction at the junction with the aorta.  The inferior mesenteric vein was also followed and located and it was  ligated and transected as well about the level of the ligament of  Treitz.   The sigmoid descending colon was mobilized laterally at the line of  Toldt with the colon placed on medial traction.  I was able to follow  down to the level near the left pelvic brim.  There was dense inflammation and ovary and fallopian tube definitely  adherent there, so  this was held off on dissection on that and continued with splenic  flexure mobilization.  The descending colon and transverse colon at the  splenic flexure were able to be gently dissected free laterally and  pulled down.  Splenocolic attachments and greater omental attachments to  the descending and distal half of the transverse colon was done using  primary Liga Sure under careful controlled visualization.  Inferior  pancreatic attachments to the tranverse colon mesentery were carefully  freed of as well to better mobilize the colon down towards the pelvis.  Inspection was made there was seen to be a little bit of oozing up near  the splenic flexure, but the spleen was spared; and, at the end of the  case on recheck, it was nice and quiet.  There was a moderate amount of  blood loss there.   Next, attention was turned towards pelvic dissection.  I was able to get  posterior mobilization, taking care to avoid any injury to the presacral  nerves.  I was able to free up down to near the level of the pelvic brim  at the sigmoid-rectal junction on the right side, using primary  controlled [employer].  On the left side care was made using primarily  sharp dissection to help free the colon off its attachments to the left  pelvic brim and sidewall and ovary.  Ultimately I got down to the level  of the sigmoid-rectal junction and was able to skeletonize the  mesorectum.  The colon at the sigmoid-rectal junction was transected  using a Contour TI stapler.  This allowed the colon to be eviscerated  through the Gel-port wound protector.  Inspection was done and all the  sigmoid and a significant amount of descending colon was involved.  The  colon and the proximal descending colon were much softer, no evidence of  inflammation.  The mesentery was taken in a ray-like fashion to the  proximal descending colon, and the colon was clamped and transected.  A  #33 EEA anvil was placed  into the open end of the proximal end of the  transection and a #0 Prolene pursestring suture was placed around it.  There appearred to be good blood supply and the mucosa was viable.  Care  was made and it came down to the pelvis very easily with no tension.  The descending and sigmoid colon had been sent for specimen with open  side proximal.   I went down to the digital rectal examination and was able to gently  dilate with fingers and bring up the stapler of the EEA, up under direct  visualization.  I was able to use a sterile hand to help confirm in the  pelvis that I was in the rectal stump.  Ultimately I was able to bring  the spike out near the staple line of the rectal stump in the middle.  The anvil of the proximal end was attached onto this and tightened down.  I could not feel any involvement any other structures with the stapler.  The stapler was held for 30 seconds, clamped down, fired and held for 20 seconds and then stapler removed.  The anastomotic rings were intact.  There were no other abnormalities noted.   A rigid proctoscopy was performed and no evidence of any bleeding.  The  colon was clamped proximal to the anastomosis and gentle insufflation  was done with the pelvis under water.  There was no evidence of any  bubbles, confirming no evidence of any leak.   The Gel-port was replaced and the capnoperitoneum was re-instituted.  Copious irrigation at this time was nicely returned; however, I did note  that at the left ureter, there was a tubular structure of the end that  was concerning for a ureteral injury.  I was able to follow it up to the  left kidney and it was consistent with this.  It seemed to have a clean  transection.  We gave some Indigo Carmine and there was no leaking from  there, but I could express some clear fluid out of there consistent with  urine and not consistent with blood.  Inspection was done.  I could find  part of the distal end of the  ureter tails.  There seemed to be enough  length that they actually could come together rather well.  The ureter  did seem viable.   An intraoperative urological consultation was made and Dr. Marcelyn Bruins  was kind enough to come and confirm evidence of left ureteral injury by  transection.  I do not know if this happened during the mobilization  from the pelvic brim, or if the stapler caught part of the ureter, but  nonetheless, there it was.  We did some general skeletonization on the  distal end of about 15 mm.  Both ends were cut and the proximal end was  more able to be easily spatulated, but given that there was a short  length from the distal end, it was decided not to do any spatulation.  Dr. Logan Bores was able to do a cystoscopy and pass over a guide wire a  double-J stent with my help laparoscopically, helping to feed the guide  wire into the proximal ureter end.  Please see his operative note for  further details.   A ureteral anastomosis was done using #4-0 PDS on a taper needle x6  interrupted stitches laparoscopically.  I did the stitching and Dr.  Logan Bores observed carefully.  We appeared to have good results and the  tissue seemed to be nice and viable.   Again copious irrigation was done and ultimately there was nice clear  return, with no evidence of any bleeding.  The transverse colon lay down  well without significant tension and it lay nicely posteriorly.  I was  hesitant to do any attachments to the retro-peritoneum at this time,  since it seemed to lay well and the small bowel did not seem to be  incarcerated or strangulated within it.   The umbilical and right lower quadrant larger ports were closed using #0  Vicryl, using a laparoscopic fascial closure device under direct  visualization to a good result.  The capnoperitoneum was evacuated.  Ports and wound protector with the Gel-port were removed.  The  peritoneum of the Pfannenstiel incision was closed in a midline  fashion using #0 Vicryl in a running fashion.  The fascia was closed using #1  PDS looped in a transverse fashion, and assisting with the classic  Pfannenstiel incision.  Copious irrigation was done.  The skin wall of  all ports was closed using #4-0 Monocryl stitch as well as the low  Pfannenstiel incision.  Please note that I did place a 15-French Blake  drain hand-assist down into the pelvis and had the drain laying over the  ureteral anastomosis and out the left mid-abdominal wall port incision.   The patient was extubated and sent to the recovery room in stable  condition.  I explained the operative findings to the patient's  daughter.  The ureteral injury and its repair with Urology was noted.  Questions were answered and she expressed understanding and  appreciation.      Ardeth Sportsman, MD  Electronically Signed     SCG/MEDQ  D:  07/16/2006  T:  07/16/2006  Job:  [id number]   cc:   Jamison Neighbor, M.D.  Fax: [phone]   Wilson Singer, M.D.  Fax: [phone]   Wilhemina Bonito. Marina Goodell, MD  [address]  [place]  [place] [address]

## 2010-08-25 NOTE — Discharge Summary (Signed)
[name]:  [id number]   MEDICAL RECORD NO.:  [id number]          PATIENT TYPE:  INP   LOCATION:  1307                         FACILITY:  [facility]   PHYSICIAN:  Anselm Pancoast. Weatherly, M.D.DATE OF BIRTH:  1940-12-15   DATE OF ADMISSION:  07/16/2006  DATE OF DISCHARGE:  08/03/2006                               DISCHARGE SUMMARY   DISCHARGE DIAGNOSIS:  1. Extensive sigmoid diverticulosis and hiatus.  2. She had a laparoscopic assisted sigmoid colectomy and there was an      intraoperative entry of the left ureter which was surgically      repaired and a stent placed.  3. About a 5 days, later she had an anastomotic leak of both the colon      and ureter, requiring reoperation, at which time a sigmoid      colectomy, colostomy, and [name] was performed.  And, repair of      the ureter by Dr. Retta Diones with a tube bladder anastomosis.  4. She had a urine leak after second operation, required a      percutaneous nephrostomy, left.   HISTORY:  [name] is a 70 year old female who has had problems  with sigmoid diverticulitis and was scheduled by Dr. Chevis Pretty, who had  seen her during these acute episodes, with a sigmoid colectomy.  In his  absence because of a medical emergency, she was transferred to Dr.  Cathie Olden care, who had seen her in the office and discussed with  her about doing the sigmoid colectomy, and scheduled her for a  laparoscopic-assisted sigmoid colectomy with anastomosis.  She was  admitted for that planned procedure after a bowel prep.  Dr. Michaell Cowing did  the procedure on July 16, 2006.  Intraoperative, he noted that he had  injured the left ureter and Dr. Logan Bores was called, and the ureter was  repaired laparoscopically, I think, over stent which been placed with  cystoscopy by Dr. Logan Bores.  Dr. Michaell Cowing, unfortunately, was going to a  national meeting the following morning and her care was turned over to  Dr. Abbey Chatters, who saw  her on the night of the first postoperative day.  She was described as having a soft abdomen with a few bowel sounds,  dressing dry, and had done nicely for the first 24 hours.  Approximately  12 hours later, she had a drop in blood pressure.   I was on call the following day and noted that her blood pressure was  98/58 and a fluid bolus was administered.  She had a slow pulse and her  IVs were increased to 125 cc/hour.  She had been on full dose Dilaudid  and whether this was the etiology of the low blood pressure we are not  sure, but we reduced her to a decreased PCA Dilaudid and repeated a  hematocrit.  The repeat hematocrit was stable at 10.7 hemoglobin and  white count was 15,400.  The patient had a bowel but we assumed that she  was having a little post-op ileus, and the slight drop her hemoglobin  was related to  postoperative blood loss.  We did not advance her diet in  spite of having a bowel movement.  Continued close observation.  The  following morning when Dr. Abbey Chatters saw her, a hemoglobin was 9.8.  Her white count had dropped to 12.3.  Her electrolytes were normal.  And, she was not having any drainage from the JP drain placed in the  pelvis.  Her diet was advanced to full liquids.  With the full liquids  and she was been followed by urology and they had suggested that they  continue with the Foley catheter.   And, the following morning she had another bowel movement and described  herself as hungry.  Her white count had jumped to 19,200.  And, she was  described as possibly having a little bit more tenderness and no  explanation for the bump of the white count to 19.2.  The afternoon of  the 12th, I saw her and at that time it looked as if we were having  possibly a little intestinal contents coming from the JP drain in the  pelvis.  Obviously, she was more tender.  I recommended that we take her  back to the operating room.  This was on Saturday.  Dr. Jamey Ripa  assisted.   We found that she obviously had a kind of a low grade wound infection  where the little Pfannenstiel incision had been.  The distal sigmoid  colon was definitely ischemic.  We had given a CT was some rectal  contrast that showed there was definitely extravasation.  And, the area  of leakage was about 2 inches proximal to the anastomosis in the distal  sigmoid that was definitely ischemic.  The ureter repair also had  separated.  And, Dr. Retta Diones was contacted on call and he came in. and  basically repaired the ureter since it would not reach after it was  trimmed back to viable tissue with tube graft from the bladder over a  stent, and I did a sigmoid colectomy and colostomy.  The patient was  taken to the ICU postoperatively and was on the respirator overnight but  was able to be extubated after about another 24 hours.  Her hemoglobin  and hematocrit were okay.  We had given her 2 units of blood.  She was  on Primaxin.   We asked critical care to follow up with [place] and she was doing  satisfactory, was extubated, and kind of improving until approximately a  week or 6 days after that surgery.  Suddenly she started having a large  amount of urine and the new Blake drain had been placed in the pelvis.  Her colostomy was working well.  The urologist was contacted.  Dr. Logan Bores  saw her on the 14th, and arranged for her to get a percutaneous  nephrostomy in the left flank.   The patient postoperatively was being seen by both me and Dr. Michaell Cowing from  our service.  And, the patient's family requested that I resume all of  her general surgical care.  The patient actually informed Dr. Michaell Cowing of  that, I think on the 14th.  With the nephrostomy being placed  percutaneously, the drainage and the pelvic drain was greatly decreased.  She has continued to improve.  Her wound that was basically a midline  incision T'd up from Pfannenstiel incision appears to be healing satisfactorily.  The  erythema and everything has resolved.  There are  still sutures visible down in the fascia  right at the bottom part of the  wound.  The wound is being packed with wet-to-drys.  Her colostomy is  working nicely.   She is now approximately a week later, being seen by Dr. Logan Bores, has a  Harrison Mons drain in the pelvis, a nephrostomy, a Foley catheter, and the  patient is changing her bags, and her wound appears to be contracted.  We stopped her antibiotics.  Her white count is normal.  She desires to  be released.   Her serum electrolytes are normal.  Her latest hemoglobin is 30, with a  white count of, I think, less than 10,000.  There is no evidence of any  infection and she is doing satisfactory.   Original path reports showed numerous diverticula in the first specimen  with some diverticulitis.  The second path report showed ulcerated and  acute inflammation associated with acute serositis, sutured nearby  anastomosis that appeared grossly ischemic, and the left tube and ovary  were removed just to get it out of the way so we could do the left  ureter implantation.   The patient will be discharged with the following care plan:  1. She will be seen by home health visiting daily for at least      probably 7-10 days, and hopefully and aid can be arranged to assist      with her personal duties as she lives alone.  She does have a niece      who is a Engineer, civil (consulting) that lives close-by that will be checking on her.  I      think the visiting nurses will change her dressings and this is      wound dressing predominantly on a daily basis. Her niece will      change it the other time. I have instructed that niece and she is      aware of what the wound looks like and feels comfortable in doing      this.  2. The patient has been on Lovenox during the hospitalization but we      will not be discharging her on Coumadin or Lovenox as she has had      no evidence of any thrombophlebitis at this time.  3. The  patient's wound is being kind of held together helpfully with      an abdominal binder that she will wear at all times.  4. She has an appointment to see Dr. Logan Bores in the office urology this      next week, and he is planning to do a check of the nephrostomy, and      if everything is doing as hoped, hopefully will get the [facility] drain      out and also the Foley catheter out.  5. She will see either Dr. Carolynne Edouard or Dr. Jamey Ripa in our office this      coming week for a wound check and they are continuing with the wet-      to-dry dressings with a dilute saline/Betadine solution, held in      place with the binders.  6. She is looking forward to be discharged and understands that if she      is having acute problem, she needs to return to the emergency room      at [facility].  If she is having a low grade fever, she      will contact our office to be seen sooner.  She has not  had any      problems with acute pulmonary type symptoms initially after the     second operation.  7. We will place her on oral iron and Vicodin for pain.  She has a      little Ativan that she is taking chronically.           ______________________________  Anselm Pancoast. Zachery Dakins, M.D.     WJW/MEDQ  D:  08/03/2006  T:  08/03/2006  Job:  [id number]   cc:   Jamison Neighbor, M.D.  Fax: [phone]   Ardeth Sportsman, MD  [address] [place] [address]

## 2010-08-25 NOTE — Op Note (Signed)
NAMESHALLON, YAKLIN NO.:  [id number]   MEDICAL RECORD NO.:  [id number]          PATIENT TYPE:  INP   LOCATION:  1229                         FACILITY:  [facility]   PHYSICIAN:  Anselm Pancoast. Weatherly, M.D.DATE OF BIRTH:  [birth year]   DATE OF PROCEDURE:  07/20/2006  DATE OF DISCHARGE:                               OPERATIVE REPORT   PREOPERATIVE DIAGNOSES:  Anastomotic leak left descending colon to  rectum and breakdown of left ureteral repair.   OPERATION:  Exploratory laparotomy, takedown of anastomosis, Hartmann  end colostomy, and repair of left ureteral breakdown injury with a  bladder tunnel flap.   SURGEON:  Anselm Pancoast. Zachery Dakins, M.D.   ASSISTANT:  Currie Paris, M.D.  Dr. Retta Diones consulted intraoperatively.   HISTORY:  The patient is a 70 year old overweight female that had  problems with recurrent sigmoid diverticulitis.  She was originally  scheduled for a sigmoid colectomy by Dr. Carolynne Edouard but with his acute  surgical problem the patient was transferred to Dr. Gordy Savers care and she  underwent a laparoscopic assisted sigmoid colectomy on Tuesday.  Intraoperatively there was an injury to the left ureter.  Dr. Logan Bores was  called to assist and she had kind of a hand-assisted laparoscopic with a  pelvic anastomosis.  I first saw the patient on Wednesday.  Called in  that she had a low blood pressure but a slow pulse.  She was on a liquid  diet and I placed her n.p.o., increased her fluid bolus and discussed  with Dr. Abbey Chatters who was seeing her in Gross's absence.  On the  following day said she was feeling better.  Still had a catheter and was  more tender in the lower abdomen than I thought.  She was thought to be  putting out what appeared to be urine from the Freer drain that was down  in the area of the anastomosis and was seen by Dr. Logan Bores.  The patient  was bloated.  Blood pressure was back up to a normal range on the 10th.  White count was  elevated at 15,400 and it looked like kind of a  postoperative ileus.  The following day she really did not have a  significant temperature.  Said she felt hungry and her white count had  dropped to 12,300 and she was started back on a full liquid diet.  Today  the 12th, she reports that she had had bowel movement but she started  having significant fever, was definitely more tender and breathing  shallower and a white count this morning was 19,200.  She was seen by  Dr. Abbey Chatters and he ordered a CT.  I saw her about the middle of the  day and the drainage that had been kind of urine-like from the [place]  drain was now obviously colored like intestinal contents.  The CT with  rectal contrast did show that she was definitely leaking from the  anastomosis.  We could not really see any definite extravasation of  urine and I discussed with her that she definitely needed a repeat  laparotomy.  We would be  doing a colostomy and whether the anastomosis  could be repaired or whether it would have to be taken completely down  or not would be determined intraoperatively.  The patient does have air  up in the abdominal wall that was seen with the CT.  She has got a  discoloration area in the lower right where she has had kind of a  Pfannenstiel hand-assisted incision.   Patient is on Zosyn and she was given a dose immediately prior to  surgery and was taken to the operating room probably about 4:30.   The patient was inducted with general anesthesia.  Endotracheal tube,  oral tube, NG tube placed into the stomach and then the patient already  has a Foley catheter and the abdomen was prepped widely with Betadine  solution and draped in a sterile manner.  I opened up the old skin  incision that was kind of a skin fold in the lower abdomen and there was  a significant infection within the abdominal wall.  The muscle was  definitely viable but more of a wound infection type area but down  __________  muscle layer.  Upon opening into the peritoneal cavity there  was kind of thin bloody fluid and it was not a generalized fecal  peritonitis but there was definitely stool in the left lower quadrant.  You could see the ureter where the repair had been done.  It looked like  the 2 ends were separated about an inch and a half and this was right  under where the anastomosis is and we had called Dr. Retta Diones when we  started surgery as I was thinking that he probably would need to assist  [place], especially if there was any problem with the urethral repair that  had been done over a stent.  We felt there was really no way of actually  kind of patching the anastomosis but it was going to be necessary to  completely take it down and it looked like that the distal portion of  the probably descending colon, the course Dr. Michaell Cowing had mobilized the  splenic flexure at the time of the sigmoid colectomy, was ischemic.  The  rectum where the anastomosis had been done, sort of right at the  peritoneal reflecting, divided the anastomosis so I could use a handle  and could see the ureter right under it so I could take a stapler and go  across the distal rectum.  We also took the TA-60 across the proximal  sigmoid colon so we could then thoroughly irrigate it and get all the  stool contents, etc, out.  At this time Dr. Retta Diones scrubbed in.  Dr.  Jamey Ripa was needed over at [facility] and he was released and Dr. Retta Diones  freshened up the ends of the urethral repair and it was definitely too  tight to do an anastomosis even over a stent.  The options that he  considered were bringing the left ureter over to the right.  It was not  long enough to reimplant into the bladder but he felt doing the sort of  bladder tube would give him about 2-3 inches and then he could bring it  down to the anastomosis and that was what he did and he will dictate that portion of the operation.  Where we had closed the distal rectum  with  the 4.8 staples with the TA-60 earlier, the bowel was edematous  that they were cutting through and I actually over sewed  this with an  interrupted suture of 2-0 silk.   Next we thoroughly irrigated, aspirated.  There was no evidence of any  intraabdominal loop abscesses and with Dr. Retta Diones completing his  portion we also removed the left tube and ovary since this anastomosis  to the ureter is right in that area and the anastomosis is over to the  left lateral pelvis in case that we put her back together in 6 months or  so, that hopefully we can approach the rectum right straight through the  midline.  I did put a 2-0 Prolene on the left side of the rectum where  it had been closed to use that as the area that we center up on when we  do the colostomy takedown down the road.   We then picked the area for the colostomy, a little above the umbilicus  and after I opened up the old Pfannenstiel incision it was necessary to  definitely do a midline incision, which I took up about 3 inches above  the umbilicus.  The wound was thoroughly irrigated.  We had cultured  this and then as far as on the closures, we used kind of a combination.  Used some 0 Vicryl inferior.  Fortunately I was able to bring the  omentum down up under peritoneum and posterior fascia and then used some  interrupted #1 Novofil in the top portion and then down where the  Pfannenstiel incision has been, I closed that with interrupted 0  Prolene.  I kind of approximated the skin edges just to kind of help  absorb some of the tension and we had made the colostomy through the  rectus muscle.  It was necessary to kind of de fat the most distal end  just to get it up through the abdominal wall because she has got such a  thick body descending colon and the appendices epiploica, etc.  The  fascia had been closed and a couple of sutures in the skin to kind of  approximate it and then I matured the colostomy using 3-0 Vicryl.   The  mucosa is not elevated but appears viable and a 1-1/2 inch colostomy bag  was placed right in the colostomy.   The patient was normotensive but obviously was not going to be able to  be extubated and she was taken extubated to the ICU and we will get a  critical care consult.  I think we will switch her antibiotics to  Primaxin.  She has got a Blake drain that was brought in through the  left lower abdomen, down in the area where the previous anastomosis and  the ureteral repair has been.  Plus, she has got a Foley catheter.  We  will check electrolytes and CBC in the ICU and await Dr. Sherene Sires for  assistance in ventilatory management and critical care immediately  postoperatively.   ESTIMATED BLOOD LOSS:  Probably 200 cc - 300 cc.   Sponge count was correct and the instrument counts were correct.           ______________________________  Anselm Pancoast. Zachery Dakins, M.D.    WJW/MEDQ  D:  07/20/2006  T:  07/20/2006  Job:  [id number]   cc:   Bertram Millard. Dahlstedt, M.D.  Fax: [phone]

## 2010-08-25 NOTE — Op Note (Signed)
[name], BARNIER NO.:  [id number]   MEDICAL RECORD NO.:  [id number]          PATIENT TYPE:  INP   LOCATION:  1618                         FACILITY:  [facility]   PHYSICIAN:  Jamison Neighbor, M.D.  DATE OF BIRTH:  1940-10-08   DATE OF PROCEDURE:  07/18/2006  DATE OF DISCHARGE:                               OPERATIVE REPORT   PREOPERATIVE DIAGNOSIS:  Transected left ureter.   POSTOPERATIVE DIAGNOSIS:  Transected left ureter.   PROCEDURE:  Cystoscopy, placement of left double-J catheter and  assistance provided to Dr. Michaell Cowing as he repaired the transected left  ureter   SURGEON:  Jamison Neighbor, M.D.   ANESTHESIA:  General.   COMPLICATIONS:  None.   DRAIN:  6-French x 28 cm double-J catheter.   BRIEF HISTORY:  A 70 year old female had this severe case of  diverticulitis involving much of her left colon.  The patient underwent  a laparoscopic hand assisted left hemicolectomy by Dr. Karie Soda.  During that procedure, the left ureter was found to be involved with the  mass and was transected.  Urologic consultation was sought for repair of  the left ureter.   After entering the operating room the patient was found in a low  lithotomy position.  She had several laparoscopy ports in place and had  a hand port in the lower abdomen.  The two ends of the ureter identified  by Dr. Michaell Cowing and could be seen laparoscopically and it certainly  appeared that there was adequate length for a primary repair.  Cystoscopy then performed.  The bladder was carefully inspected  and was  free of any tumor or stones.  Both the orifices were normal  configuration and location.  Left ureter was cannulated and a ureteral  catheter was advanced up the ureter.  Laparoscopically it could be seen  to exit the ureter.  A floppy tip guidewire was then passed through the  ureteral catheter.  The ureteral catheter with the guidewire coming out  of the ends were then advanced up to the  proximal ureter.  Using a  combination of laparoscopic control by Dr. Michaell Cowing and cystoscopy control,  the ureteral catheter was introduced into the ureter.  The guidewire was  advanced all way up to the kidney.  With the guidewire and placed the  ureteral catheter was removed.  A 6-French x 28 cm double-J catheter was  then advanced over the wire.  Using the laparoscopic instruments to  prevent the guidewire from buckling and using cystoscopic control in  order to advance it the double-J was advanced up into the kidney and the  double-J allowed to coil within the bladder.  At that point there was a  small gap that needed to be bridged.  Six separate sutures of 4-0 Vicryl  used to complete the repair.  The edges of the ureter were trimmed back  in case of any injury and the ureters appeared healthy at the  time of repair.  There appeared to be good mucosa to mucosa coaptation  and it is anticipated that this should heal without sequelae  some well.  Dr. Michaell Cowing will place a drain to one of the port sites.  The double-J  catheter will need to be left in place for 6 weeks or so.  Foley  catheter will need to be left in place for approximately 1 week.           ______________________________  Jamison Neighbor, M.D.  Electronically Signed     RJE/MEDQ  D:  07/18/2006  T:  07/18/2006  Job:  [id number]

## 2010-08-25 NOTE — Assessment & Plan Note (Signed)
[facility]                         GASTROENTEROLOGY OFFICE NOTE   [name], [name]                     MRN:          [id number]  DATE:04/04/2006                            DOB:          12/27/1940    REASON FOR CONSULTATION:  Recurrent diverticulitis.   HISTORY:  This is a 70 year old female with a history of asthma,  arthritis, anxiety, and recurrent diverticulitis. The patient reports  having several bouts of symptomatic diverticulitis annually. Problems  have been worse over the past year and in particular, over the past 3-4  months. CAT scan of the abdomen and pelvis performed November 9th  revealed changes consistent with diverticulitis in the region of the  descending and sigmoid colon junction. She was treated with  ciprofloxacin and metronidazole. She improved in time though  subsequently had recurrent symptoms. Followup CAT scan, December 18, did  not show significant residual diverticulitis. She continues at this time  with local left sided abdominal discomfort. No nausea, vomiting, fevers,  or GI bleeding. Intermittent changes in bowel habits and bloating. She  has undergone prior colonoscopy. I have a report from Dr. Danise Edge  of a colonoscopy performed 06/26/00. This revealed universal colonic  diverticulosis which was most extensive in the left colon. No other  abnormalities. Followup colonoscopy in 10 years recommended.   PAST MEDICAL HISTORY:  As above.   PAST SURGICAL HISTORY:  1. Status post laparoscopic cholecystectomy 03/25/03 with Dr. Chevis Pretty.  2. Status post appendectomy.  3. History of diagnostic laparoscopy.   ALLERGIES:  No known drug allergies.   CURRENT MEDICATIONS:  1. Risperdal 2 mg daily.  2. Lorazepam 1 mg b.i.d.  3. Advair 10/50 dosage frequency unspecified.   FAMILY HISTORY:  No family history of gastrointestinal malignancy. Both  parents with a history of heart disease.   SOCIAL  HISTORY:  The patient is married. She has a bachelor's degree in  social work. 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 She does not smoke and rarely uses  alcohol.   REVIEW OF SYSTEMS:  Per diagnostic evaluation form.   PHYSICAL EXAMINATION:  GENERAL: Well appearing female in no acute  distress.  VITAL SIGNS: Blood pressure 90/60, heart rate 80, weight is 220.8  pounds. She is 5 feet 4 inches in height.  HEENT: Sclerae anicteric, conjunctiva are pink, oral mucosa is intact.  No adenopathy.  LUNGS: Clear.  HEART: Regular.  ABDOMEN: Soft with focal tenderness in the left lower quadrant to  palpation. No rebound or mass noted.  EXTREMITIES: Without edema.   IMPRESSION:  This is a 70 year old with extensive left sided  diverticulosis and recurring acute diverticulitis. No complicated  features by CT scan imaging. Prior colonoscopy 5 years ago as described.  Currently with ongoing tenderness. Suspect smoldering disease despite  improved appearrance on imaging.   RECOMMENDATIONS:  1. Prescribe Augmentin 875 mg b.i.d. x10 days to treat smoldering      diverticulitis.  2. Modified diet to be advanced to low residue as tolerated.  3. Surgical referral to  Dr. Carolynne Edouard regarding possible segmental      colectomy for recurrent,more frequent, and worsening      diverticulitis.  4. Ongoing general medical care with Dr. Karilyn Cota.     Wilhemina Bonito. Marina Goodell, MD  Electronically Signed    JNP/MedQ  DD: 04/07/2006  DT: 04/07/2006  Job #: [id number]   cc:   Wilson Singer, M.D.  Ollen Gross. Vernell Morgans, M.D.

## 2010-12-29 LAB — BASIC METABOLIC PANEL
BUN: 11
BUN: 4 — ABNORMAL LOW
BUN: 7
BUN: 8
CO2: 28
CO2: 26
CO2: 28
CO2: 28
CO2: 29
Calcium: 7.9 — ABNORMAL LOW
Calcium: 8 — ABNORMAL LOW
Calcium: 8 — ABNORMAL LOW
Calcium: 8.1 — ABNORMAL LOW
Calcium: 8.1 — ABNORMAL LOW
Chloride: 107
Chloride: 103
Chloride: 104
Chloride: 105
Chloride: 105
Chloride: 107
Chloride: 108
Creatinine, Ser: 1.1
Creatinine, Ser: 1.01
Creatinine, Ser: 1.1
Creatinine, Ser: 1.11
Creatinine, Ser: 1.13
Creatinine, Ser: 1.31 — ABNORMAL HIGH
GFR calc Af Amer: 56 — ABNORMAL LOW
GFR calc Af Amer: >60
GFR calc Af Amer: 49 — ABNORMAL LOW
GFR calc Af Amer: 57 — ABNORMAL LOW
GFR calc Af Amer: 58 — ABNORMAL LOW
GFR calc Af Amer: 60 — ABNORMAL LOW
GFR calc Af Amer: >60
GFR calc Af Amer: >60
GFR calc non Af Amer: 46 — ABNORMAL LOW
GFR calc non Af Amer: 41 — ABNORMAL LOW
GFR calc non Af Amer: 42 — ABNORMAL LOW
GFR calc non Af Amer: 48 — ABNORMAL LOW
GFR calc non Af Amer: 50 — ABNORMAL LOW
GFR calc non Af Amer: 55 — ABNORMAL LOW
GFR calc non Af Amer: 57 — ABNORMAL LOW
Glucose, Bld: 100 — ABNORMAL HIGH
Glucose, Bld: 102 — ABNORMAL HIGH
Glucose, Bld: 104 — ABNORMAL HIGH
Glucose, Bld: 113 — ABNORMAL HIGH
Glucose, Bld: 123 — ABNORMAL HIGH
Potassium: 4.2
Potassium: 4.8
Potassium: 3.2 — ABNORMAL LOW
Potassium: 3.3 — ABNORMAL LOW
Potassium: 3.8
Potassium: 4
Potassium: 4.5
Sodium: 138
Sodium: 135
Sodium: 138
Sodium: 138
Sodium: 139
Sodium: 142

## 2010-12-29 LAB — CBC
HCT: 29.1 — ABNORMAL LOW
HCT: 32 — ABNORMAL LOW
HCT: 24.5 — ABNORMAL LOW
HCT: 25.7 — ABNORMAL LOW
HCT: 26.1 — ABNORMAL LOW
HCT: 26.6 — ABNORMAL LOW
HCT: 27 — ABNORMAL LOW
HCT: 28.4 — ABNORMAL LOW
Hemoglobin: 10.8 — ABNORMAL LOW
Hemoglobin: 13.2
Hemoglobin: 8.2 — ABNORMAL LOW
Hemoglobin: 8.7 — ABNORMAL LOW
Hemoglobin: 8.8 — ABNORMAL LOW
Hemoglobin: 9.3 — ABNORMAL LOW
MCHC: 33
MCHC: 32.6
MCHC: 32.8
MCHC: 32.9
MCHC: 33.1
MCHC: 33.3
MCV: 83
MCV: 80.4
MCV: 81.1
MCV: 81.5
MCV: 82.1
MCV: 82.5
Platelets: 221
Platelets: 212
Platelets: 252
Platelets: 303
Platelets: 668 — ABNORMAL HIGH
Platelets: 779 — ABNORMAL HIGH
Platelets: 802 — ABNORMAL HIGH
RBC: 3.51 — ABNORMAL LOW
RBC: 4.83
RBC: 3.06 — ABNORMAL LOW
RBC: 3.24 — ABNORMAL LOW
RBC: 3.25 — ABNORMAL LOW
RBC: 3.27 — ABNORMAL LOW
RBC: 3.4 — ABNORMAL LOW
RDW: 15.3
RDW: 15.6 — ABNORMAL HIGH
RDW: 15.8 — ABNORMAL HIGH
RDW: 16.5 — ABNORMAL HIGH
RDW: 16.7 — ABNORMAL HIGH
WBC: 12.8 — ABNORMAL HIGH
WBC: 8.2
WBC: 11.7 — ABNORMAL HIGH
WBC: 12.7 — ABNORMAL HIGH
WBC: 12.9 — ABNORMAL HIGH
WBC: 14.1 — ABNORMAL HIGH
WBC: 15.1 — ABNORMAL HIGH

## 2010-12-29 LAB — DIFFERENTIAL
Basophils Absolute: 0
Basophils Absolute: 0
Basophils Absolute: 0
Basophils Absolute: 0.1
Basophils Relative: 0
Basophils Relative: 1
Basophils Relative: 0
Basophils Relative: 0
Basophils Relative: 0
Basophils Relative: 0
Eosinophils Absolute: 0
Eosinophils Absolute: 0.2
Eosinophils Absolute: 0.2
Eosinophils Absolute: 0.3
Eosinophils Absolute: 0.4
Eosinophils Absolute: 0.5
Eosinophils Relative: 0
Eosinophils Relative: 0
Eosinophils Relative: 2
Eosinophils Relative: 2
Eosinophils Relative: 3
Eosinophils Relative: 4
Lymphocytes Relative: 6 — ABNORMAL LOW
Lymphocytes Relative: 11 — ABNORMAL LOW
Lymphocytes Relative: 13
Lymphocytes Relative: 14
Lymphocytes Relative: 6 — ABNORMAL LOW
Lymphocytes Relative: 7 — ABNORMAL LOW
Lymphocytes Relative: 8 — ABNORMAL LOW
Lymphs Abs: 0.8
Lymphs Abs: 1.9
Lymphs Abs: 0.8
Lymphs Abs: 1.4
Lymphs Abs: 1.4
Lymphs Abs: 1.7
Monocytes Absolute: 1.4 — ABNORMAL HIGH
Monocytes Absolute: 1.5 — ABNORMAL HIGH
Monocytes Absolute: 1.2 — ABNORMAL HIGH
Monocytes Absolute: 1.3 — ABNORMAL HIGH
Monocytes Relative: 11
Monocytes Relative: 10
Monocytes Relative: 12
Monocytes Relative: 12
Monocytes Relative: 9
Monocytes Relative: 9
Neutro Abs: 10.7 — ABNORMAL HIGH
Neutro Abs: 11.9 — ABNORMAL HIGH
Neutro Abs: 8.7 — ABNORMAL HIGH
Neutro Abs: 9.4 — ABNORMAL HIGH
Neutrophils Relative %: 83 — ABNORMAL HIGH
Neutrophils Relative %: 74
Neutrophils Relative %: 76
Neutrophils Relative %: 79 — ABNORMAL HIGH
Neutrophils Relative %: 79 — ABNORMAL HIGH
Neutrophils Relative %: 79 — ABNORMAL HIGH
Neutrophils Relative %: 84 — ABNORMAL HIGH

## 2010-12-29 LAB — POCT I-STAT 4, (NA,K, GLUC, HGB,HCT)
HCT: 34 — ABNORMAL LOW
Hemoglobin: 11.6 — ABNORMAL LOW

## 2010-12-29 LAB — COMPREHENSIVE METABOLIC PANEL
ALT: 20
AST: 17
Alkaline Phosphatase: 80
CO2: 27
Calcium: 9.6
Chloride: 103
GFR calc Af Amer: 42 — ABNORMAL LOW
GFR calc non Af Amer: 35 — ABNORMAL LOW
Potassium: 3.7
Sodium: 139
Total Bilirubin: 0.5

## 2010-12-29 LAB — CULTURE, ROUTINE-ABSCESS

## 2010-12-29 LAB — APTT: aPTT: 29

## 2011-01-01 LAB — BASIC METABOLIC PANEL
BUN: 5 — ABNORMAL LOW
BUN: 5 — ABNORMAL LOW
BUN: 5 — ABNORMAL LOW
CO2: 24
CO2: 25
CO2: 27
Calcium: 8.2 — ABNORMAL LOW
Calcium: 8.4
Calcium: 8.5
Chloride: 100
Chloride: 103
Chloride: 104
Creatinine, Ser: 1.15
Creatinine, Ser: 1.17
Creatinine, Ser: 1.38 — ABNORMAL HIGH
Creatinine, Ser: 1.45 — ABNORMAL HIGH
GFR calc Af Amer: 44 — ABNORMAL LOW
GFR calc Af Amer: 56 — ABNORMAL LOW
GFR calc non Af Amer: 43 — ABNORMAL LOW
GFR calc non Af Amer: 46 — ABNORMAL LOW
GFR calc non Af Amer: 47 — ABNORMAL LOW
Glucose, Bld: 106 — ABNORMAL HIGH
Glucose, Bld: 106 — ABNORMAL HIGH
Potassium: 4.3
Sodium: 135
Sodium: 135
Sodium: 136

## 2011-01-01 LAB — DIFFERENTIAL
Basophils Absolute: 0
Basophils Absolute: 0
Basophils Absolute: 0.1
Basophils Relative: 1
Eosinophils Absolute: 0.3
Eosinophils Absolute: 0.3
Eosinophils Absolute: 0.4
Eosinophils Relative: 3
Eosinophils Relative: 3
Eosinophils Relative: 3
Lymphocytes Relative: 11 — ABNORMAL LOW
Lymphocytes Relative: 14
Lymphocytes Relative: 15
Lymphs Abs: 1.6
Lymphs Abs: 1.6
Lymphs Abs: 1.9
Monocytes Absolute: 1
Monocytes Absolute: 1.2 — ABNORMAL HIGH
Monocytes Relative: 11
Monocytes Relative: 8
Neutro Abs: 11.4 — ABNORMAL HIGH
Neutro Abs: 5.7
Neutrophils Relative %: 64
Neutrophils Relative %: 72
Neutrophils Relative %: 76
Neutrophils Relative %: 78 — ABNORMAL HIGH

## 2011-01-01 LAB — CBC
HCT: 27 — ABNORMAL LOW
HCT: 27.3 — ABNORMAL LOW
Hemoglobin: 8.8 — ABNORMAL LOW
Hemoglobin: 8.9 — ABNORMAL LOW
Hemoglobin: 9.7 — ABNORMAL LOW
MCHC: 32.7
MCV: 79.7
MCV: 79.8
MCV: 80.3
Platelets: 610 — ABNORMAL HIGH
Platelets: 615 — ABNORMAL HIGH
Platelets: 761 — ABNORMAL HIGH
RBC: 3.41 — ABNORMAL LOW
RBC: 3.69 — ABNORMAL LOW
RDW: 16.3 — ABNORMAL HIGH
RDW: 16.8 — ABNORMAL HIGH
WBC: 10.9 — ABNORMAL HIGH
WBC: 13.3 — ABNORMAL HIGH
WBC: 14.6 — ABNORMAL HIGH
WBC: 8.9

## 2011-01-02 LAB — COMPREHENSIVE METABOLIC PANEL
ALT: 75 — ABNORMAL HIGH
ALT: 26
ALT: 29
ALT: 53 — ABNORMAL HIGH
ALT: 64 — ABNORMAL HIGH
AST: 13
AST: 28
AST: 38 — ABNORMAL HIGH
Albumin: 2.1 — ABNORMAL LOW
Albumin: 2.1 — ABNORMAL LOW
Albumin: 2.4 — ABNORMAL LOW
Alkaline Phosphatase: 70
Alkaline Phosphatase: 72
Alkaline Phosphatase: 76
BUN: 14
BUN: 10
BUN: 7
BUN: 8
BUN: 9
CO2: 24
CO2: 26
CO2: 26
CO2: 27
Calcium: 8.4
Calcium: 8.4
Calcium: 9
Calcium: 9
Calcium: 9.1
Chloride: 105
Chloride: 107
Creatinine, Ser: 1.14
Creatinine, Ser: 1.22 — ABNORMAL HIGH
GFR calc Af Amer: 54 — ABNORMAL LOW
GFR calc Af Amer: 55 — ABNORMAL LOW
GFR calc Af Amer: 57 — ABNORMAL LOW
GFR calc non Af Amer: 44 — ABNORMAL LOW
GFR calc non Af Amer: 47 — ABNORMAL LOW
GFR calc non Af Amer: 48 — ABNORMAL LOW
GFR calc non Af Amer: 49 — ABNORMAL LOW
Glucose, Bld: 123 — ABNORMAL HIGH
Glucose, Bld: 86
Glucose, Bld: 96
Glucose, Bld: 98
Potassium: 4.3
Sodium: 131 — ABNORMAL LOW
Sodium: 139
Sodium: 139
Sodium: 140
Sodium: 140
Total Bilirubin: 0.1 — ABNORMAL LOW
Total Bilirubin: 0.2 — ABNORMAL LOW
Total Protein: 5.6 — ABNORMAL LOW
Total Protein: 4.9 — ABNORMAL LOW
Total Protein: 5.2 — ABNORMAL LOW
Total Protein: 5.4 — ABNORMAL LOW

## 2011-01-02 LAB — DIFFERENTIAL
Basophils Absolute: 0
Basophils Absolute: 0
Basophils Relative: 0
Basophils Relative: 0
Basophils Relative: 0
Basophils Relative: 0
Eosinophils Absolute: 0.3
Eosinophils Absolute: 0.3
Eosinophils Absolute: 0.3
Eosinophils Absolute: 0.3
Eosinophils Relative: 1
Eosinophils Relative: 3
Eosinophils Relative: 3
Lymphocytes Relative: 5 — ABNORMAL LOW
Lymphocytes Relative: 11 — ABNORMAL LOW
Lymphocytes Relative: 17
Lymphs Abs: 1
Lymphs Abs: 1.5
Lymphs Abs: 1.7
Lymphs Abs: 2.4
Monocytes Absolute: 0.7
Monocytes Absolute: 0.8
Monocytes Relative: 10
Monocytes Relative: 7
Monocytes Relative: 7
Monocytes Relative: 8
Neutro Abs: 15.3 — ABNORMAL HIGH
Neutro Abs: 11.3 — ABNORMAL HIGH
Neutro Abs: 6.8
Neutro Abs: 7.6
Neutro Abs: 7.7
Neutrophils Relative %: 85 — ABNORMAL HIGH
Neutrophils Relative %: 68
Neutrophils Relative %: 70
Neutrophils Relative %: 73

## 2011-01-02 LAB — CBC
HCT: 28.5 — ABNORMAL LOW
HCT: 29.8 — ABNORMAL LOW
HCT: 30.3 — ABNORMAL LOW
HCT: 30.9 — ABNORMAL LOW
Hemoglobin: 9.3 — ABNORMAL LOW
Hemoglobin: 10.2 — ABNORMAL LOW
Hemoglobin: 9.5 — ABNORMAL LOW
Hemoglobin: 9.8 — ABNORMAL LOW
MCHC: 33.5
MCHC: 32
MCHC: 32.3
MCHC: 32.4
MCHC: 32.9
MCV: 75.5 — ABNORMAL LOW
MCV: 76.2 — ABNORMAL LOW
Platelets: 323
Platelets: 360
Platelets: 487 — ABNORMAL HIGH
RBC: 3.91
RBC: 3.97
RBC: 4.01
RDW: 16.2 — ABNORMAL HIGH
RDW: 16.8 — ABNORMAL HIGH
RDW: 17 — ABNORMAL HIGH
WBC: 10.6 — ABNORMAL HIGH
WBC: 9.2

## 2011-01-02 LAB — URINE MICROSCOPIC-ADD ON

## 2011-01-02 LAB — CULTURE, BLOOD (ROUTINE X 2): Culture: NO GROWTH

## 2011-01-02 LAB — APTT: aPTT: 30

## 2011-01-02 LAB — CULTURE, ROUTINE-ABSCESS

## 2011-01-02 LAB — URINALYSIS, ROUTINE W REFLEX MICROSCOPIC
Bilirubin Urine: NEGATIVE
Glucose, UA: NEGATIVE
Hgb urine dipstick: NEGATIVE
Ketones, ur: NEGATIVE
Protein, ur: 30 — AB

## 2011-01-02 LAB — CLOSTRIDIUM DIFFICILE EIA
C difficile Toxins A+B, EIA: NEGATIVE
C difficile Toxins A+B, EIA: NEGATIVE

## 2011-01-02 LAB — PROTIME-INR: INR: 1.1

## 2011-01-05 ENCOUNTER — Ambulatory Visit (HOSPITAL_COMMUNITY): Payer: Medicare Other

## 2011-01-24 LAB — CBC
Hemoglobin: 8.6 — ABNORMAL LOW
MCHC: 34.5
Platelets: 296
RBC: 2.95 — ABNORMAL LOW
RDW: 15.7 — ABNORMAL HIGH
WBC: 11.1 — ABNORMAL HIGH

## 2011-01-24 LAB — DIFFERENTIAL
Basophils Relative: 1
Eosinophils Absolute: 0.6
Monocytes Absolute: 1.3 — ABNORMAL HIGH
Monocytes Relative: 9

## 2011-01-24 LAB — COMPREHENSIVE METABOLIC PANEL
ALT: 30
AST: 18
Albumin: 1.6 — ABNORMAL LOW
Alkaline Phosphatase: 58
GFR calc Af Amer: >60
Glucose, Bld: 124 — ABNORMAL HIGH
Potassium: 4.2
Sodium: 135
Total Protein: 4.3 — ABNORMAL LOW

## 2011-01-24 LAB — TRIGLYCERIDES: Triglycerides: 68

## 2011-01-24 LAB — BASIC METABOLIC PANEL
BUN: 16
CO2: 25
Calcium: 7.6 — ABNORMAL LOW
Creatinine, Ser: 0.99
GFR calc Af Amer: >60
GFR calc non Af Amer: 58 — ABNORMAL LOW
Potassium: 3.6

## 2011-01-24 LAB — MAGNESIUM: Magnesium: 2.1

## 2011-01-24 LAB — PHOSPHORUS
Phosphorus: 3.9
Phosphorus: 6.3 — ABNORMAL HIGH

## 2011-01-25 LAB — TYPE AND SCREEN

## 2011-01-25 LAB — CBC
HCT: 20.7 — ABNORMAL LOW
Hemoglobin: 13.5
Hemoglobin: 7 — CL
Hemoglobin: 8.5 — ABNORMAL LOW
MCHC: 33.6
MCHC: 33.9
MCHC: 34.1
MCV: 84.4
MCV: 84.8
MCV: 85.1
MCV: 85.2
MCV: 85.4
Platelets: 200
Platelets: 206
Platelets: 223
RBC: 2.94 — ABNORMAL LOW
RBC: 4.72
RDW: 15.2 — ABNORMAL HIGH
RDW: 15.4 — ABNORMAL HIGH
WBC: 13.6 — ABNORMAL HIGH
WBC: 17.5 — ABNORMAL HIGH
WBC: 8.6

## 2011-01-25 LAB — COMPREHENSIVE METABOLIC PANEL
AST: 39 — ABNORMAL HIGH
Albumin: 1.4 — ABNORMAL LOW
Alkaline Phosphatase: 34 — ABNORMAL LOW
BUN: 22
CO2: 25
Chloride: 100
Chloride: 103
Creatinine, Ser: 1.14
Creatinine, Ser: 1.17
GFR calc Af Amer: 58 — ABNORMAL LOW
GFR calc non Af Amer: 47 — ABNORMAL LOW
Glucose, Bld: 132 — ABNORMAL HIGH
Potassium: 4.1
Potassium: 4.7
Total Bilirubin: 0.4
Total Bilirubin: 0.4
Total Protein: 3.2 — ABNORMAL LOW

## 2011-01-25 LAB — CHOLESTEROL, TOTAL: Cholesterol: 62

## 2011-01-25 LAB — BASIC METABOLIC PANEL
BUN: 9
CO2: 26
CO2: 27
CO2: 29
Calcium: 7.8 — ABNORMAL LOW
Calcium: 9.7
Chloride: 101
Chloride: 104
Chloride: 105
Creatinine, Ser: 0.99
Creatinine, Ser: 1.21 — ABNORMAL HIGH
GFR calc Af Amer: >60
GFR calc Af Amer: >60
Glucose, Bld: 121 — ABNORMAL HIGH
Sodium: 140

## 2011-01-25 LAB — DIFFERENTIAL
Basophils Absolute: 0
Basophils Relative: 0
Lymphocytes Relative: 8 — ABNORMAL LOW
Neutro Abs: 11.4 — ABNORMAL HIGH
Neutrophils Relative %: 84 — ABNORMAL HIGH

## 2011-01-25 LAB — PHOSPHORUS
Phosphorus: 2.4
Phosphorus: 2.9

## 2011-01-25 LAB — OCCULT BLOOD X 1 CARD TO LAB, STOOL: Fecal Occult Bld: POSITIVE

## 2011-01-25 LAB — MAGNESIUM: Magnesium: 1.6

## 2011-01-31 ENCOUNTER — Ambulatory Visit: Payer: Medicare Other | Attending: Orthopedic Surgery | Admitting: Physical Therapy

## 2011-01-31 DIAGNOSIS — R262 Difficulty in walking, not elsewhere classified: Secondary | ICD-10-CM | POA: Insufficient documentation

## 2011-01-31 DIAGNOSIS — M25569 Pain in unspecified knee: Secondary | ICD-10-CM | POA: Insufficient documentation

## 2011-01-31 DIAGNOSIS — IMO0001 Reserved for inherently not codable concepts without codable children: Secondary | ICD-10-CM | POA: Insufficient documentation

## 2011-02-08 ENCOUNTER — Encounter: Payer: Medicare Other | Admitting: Physical Therapy

## 2011-02-13 ENCOUNTER — Ambulatory Visit: Payer: Medicare Other | Attending: Orthopedic Surgery | Admitting: Physical Therapy

## 2011-02-13 DIAGNOSIS — IMO0001 Reserved for inherently not codable concepts without codable children: Secondary | ICD-10-CM | POA: Insufficient documentation

## 2011-02-13 DIAGNOSIS — M25569 Pain in unspecified knee: Secondary | ICD-10-CM | POA: Insufficient documentation

## 2011-02-13 DIAGNOSIS — R262 Difficulty in walking, not elsewhere classified: Secondary | ICD-10-CM | POA: Insufficient documentation

## 2011-02-14 ENCOUNTER — Ambulatory Visit: Payer: Medicare Other | Admitting: Physical Therapy

## 2011-02-15 ENCOUNTER — Ambulatory Visit: Payer: Medicare Other | Admitting: Physical Therapy

## 2011-02-20 ENCOUNTER — Ambulatory Visit: Payer: Medicare Other | Admitting: Physical Therapy

## 2011-02-22 ENCOUNTER — Ambulatory Visit: Payer: Medicare Other | Admitting: Physical Therapy

## 2011-02-27 ENCOUNTER — Ambulatory Visit: Payer: Medicare Other | Admitting: Rehabilitation

## 2011-03-29 DIAGNOSIS — K432 Incisional hernia without obstruction or gangrene: Secondary | ICD-10-CM | POA: Insufficient documentation

## 2011-09-24 ENCOUNTER — Encounter: Payer: Self-pay | Admitting: Internal Medicine

## 2011-10-23 ENCOUNTER — Ambulatory Visit: Payer: Medicare Other | Admitting: Internal Medicine

## 2011-11-08 ENCOUNTER — Encounter: Payer: Self-pay | Admitting: Internal Medicine

## 2011-11-08 ENCOUNTER — Ambulatory Visit (INDEPENDENT_AMBULATORY_CARE_PROVIDER_SITE_OTHER): Payer: Medicare Other | Admitting: Internal Medicine

## 2011-11-08 VITALS — BP 110/70 | HR 84 | Ht 63.75 in | Wt 219.0 lb

## 2011-11-08 DIAGNOSIS — K219 Gastro-esophageal reflux disease without esophagitis: Secondary | ICD-10-CM

## 2011-11-08 DIAGNOSIS — K5732 Diverticulitis of large intestine without perforation or abscess without bleeding: Secondary | ICD-10-CM

## 2011-11-08 DIAGNOSIS — K5730 Diverticulosis of large intestine without perforation or abscess without bleeding: Secondary | ICD-10-CM

## 2011-11-08 DIAGNOSIS — Z1211 Encounter for screening for malignant neoplasm of colon: Secondary | ICD-10-CM

## 2011-11-08 DIAGNOSIS — K7689 Other specified diseases of liver: Secondary | ICD-10-CM

## 2011-11-08 MED ORDER — MOVIPREP 100 G PO SOLR: 1.0000 | Freq: Once | ORAL | Status: DC

## 2011-11-08 NOTE — Progress Notes (Addendum)
HISTORY OF PRESENT ILLNESS:  [name] is a 71 y.o. female with the below listed medical history who presents today regarding surveillance colonoscopy. The patient initially underwent colonoscopy with Dr. Danise Edge in 2002. This was negative except for significant diverticulosis. She has had recurrent diverticulitis and underwent attempted laparoscopic segmental colectomy. This was complicated by urologic injury leading to left nephrectomy and colostomy which was subsequently reversed. She also had problems with intra-abdominal abscess and ventral hernia repair as recent as May 2010. I last saw her in August of 2010 for right upper quadrant abdominal pain. See that dictation. This resolved. She reports no further problems with diverticulitis. She does have alternating bowel habits and hemorrhoids. Also chronic gastroesophageal reflux disease for which he takes PPI. Occasional breakthrough. No dysphagia. Her chronic medical problems are stable.  REVIEW OF SYSTEMS:  All non-GI ROS negative except for sinus and allergy trouble, back pain, arthritis, visual change, cough, excessive urination, urinary leakage  Past Medical History  Diagnosis Date  . Asthma 1995  . Anemia 1990  . Anxiety   . Arthritis 2004  . Diverticulosis 2003  . GERD (gastroesophageal reflux disease)     Past Surgical History  Procedure Date  . Appendectomy 1966  . Cholecystectomy 2004  . Incisional hernia repair 2010  . Colon surgery 2008  . Tonsillectomy     Social History [name]  reports that she has never smoked. She has never used smokeless tobacco. She reports that she does not drink alcohol or use illicit drugs.  family history includes Colon polyps in her other; Diabetes in her paternal aunt and paternal grandmother; Heart attack in her father; and Heart failure in her mother.  No Known Allergies     PHYSICAL EXAMINATION: Vital signs: BP 110/70  Pulse 84  Ht 5' 3.75" (1.619 m)  Wt  219 lb (99.338 kg)  BMI 37.89 kg/m2  Constitutional: generally well-appearing, no acute distress. Obese Psychiatric: alert and oriented x3, cooperative Eyes: extraocular movements intact, anicteric, conjunctiva pink Mouth: oral pharynx moist, no lesions Neck: supple no lymphadenopathy Cardiovascular: heart regular rate and rhythm, no murmur Lungs: clear to auscultation bilaterally Abdomen: soft,obese, nontender, nondistended, no obvious ascites, no peritoneal signs, normal bowel sounds, no organomegaly. Multiple surgical incisions well-healed. No obvious hernia Rectal:deferred until colonoscopy Extremities: no lower extremity edema bilaterally Skin: no lesions on visible extremities Neuro: No focal deficits.   ASSESSMENT:  #1. Diverticulosis with complicated diverticulitis. Prior surgery with complications as outlined. No problems in a few years. #2. Repeat surveillance/screening colonoscopy. Appropriate candidate. Last examination 2002. No neoplasia. #3. GERD. Requiring chronic PPI. Stable except for occasional breakthrough. Need to review reflux precautions #4. Known hepatic cysts on imaging   PLAN:  #1. Colonoscopy.The nature of the procedure, as well as the risks, benefits, and alternatives were carefully and thoroughly reviewed with the patient. Ample time for discussion and questions allowed. The patient understood, was satisfied, and agreed to proceed. #2. Movi prep prescribed. The patient instructed on its use #3. Reflux precautions #4. Continue PPI.  Addendum: Review of outside laboratories from June 2013 revealing normal CBC with hemoglobin 14.5. Also normal comprehensive metabolic panel including liver function tests. Normal TSH and hemoglobin A1c. As well, review of interval outside imaging from May 2012 reveals fatty liver and benign hepatic cyst.

## 2011-11-08 NOTE — Patient Instructions (Addendum)
You have been scheduled for a colonoscopy with propofol. Please follow written instructions given to you at your visit today.  Please pick up your prep kit at the pharmacy within the next 1-3 days. If you use inhalers (even only as needed), please bring them with you on the day of your procedure.  

## 2011-12-06 ENCOUNTER — Telehealth: Payer: Self-pay | Admitting: *Deleted

## 2011-12-06 NOTE — Telephone Encounter (Signed)
Spoke with pt and she is aware.

## 2011-12-06 NOTE — Telephone Encounter (Signed)
Called patient and she was concerned about taking the Moviprep with her having only one kidney.  She is scheduled for a colonoscopy with you tomorrow and starts her prep this afternoon.  I told her I would ask you about it and call her back.  Please advise.

## 2011-12-06 NOTE — Telephone Encounter (Signed)
This is the appropriate prep for her. Thanks for checking

## 2011-12-07 ENCOUNTER — Encounter: Payer: Self-pay | Admitting: Internal Medicine

## 2011-12-07 ENCOUNTER — Ambulatory Visit (AMBULATORY_SURGERY_CENTER): Payer: Medicare Other | Admitting: Internal Medicine

## 2011-12-07 VITALS — BP 149/71 | HR 80 | Temp 98.8°F | Resp 19 | Ht 63.75 in | Wt 219.0 lb

## 2011-12-07 DIAGNOSIS — D126 Benign neoplasm of colon, unspecified: Secondary | ICD-10-CM

## 2011-12-07 DIAGNOSIS — E669 Obesity, unspecified: Secondary | ICD-10-CM | POA: Insufficient documentation

## 2011-12-07 DIAGNOSIS — K219 Gastro-esophageal reflux disease without esophagitis: Secondary | ICD-10-CM

## 2011-12-07 DIAGNOSIS — Z1211 Encounter for screening for malignant neoplasm of colon: Secondary | ICD-10-CM

## 2011-12-07 DIAGNOSIS — K5730 Diverticulosis of large intestine without perforation or abscess without bleeding: Secondary | ICD-10-CM

## 2011-12-07 DIAGNOSIS — K5732 Diverticulitis of large intestine without perforation or abscess without bleeding: Secondary | ICD-10-CM

## 2011-12-07 DIAGNOSIS — J45909 Unspecified asthma, uncomplicated: Secondary | ICD-10-CM

## 2011-12-07 DIAGNOSIS — M47814 Spondylosis without myelopathy or radiculopathy, thoracic region: Secondary | ICD-10-CM | POA: Insufficient documentation

## 2011-12-07 DIAGNOSIS — M1990 Unspecified osteoarthritis, unspecified site: Secondary | ICD-10-CM | POA: Insufficient documentation

## 2011-12-07 MED ORDER — SODIUM CHLORIDE 0.9 % IV SOLN: 500.0000 mL | INTRAVENOUS | Status: DC

## 2011-12-07 NOTE — Patient Instructions (Addendum)
YOU HAD AN ENDOSCOPIC PROCEDURE TODAY AT THE [facility]: Refer to the procedure report that was given to you for any specific questions about what was found during the examination.  If the procedure report does not answer your questions, please call your gastroenterologist to clarify.  If you requested that your care partner not be given the details of your procedure findings, then the procedure report has been included in a sealed envelope for you to review at your convenience later.  YOU SHOULD EXPECT: Some feelings of bloating in the abdomen. Passage of more gas than usual.  Walking can help get rid of the air that was put into your GI tract during the procedure and reduce the bloating. If you had a lower endoscopy (such as a colonoscopy or flexible sigmoidoscopy) you may notice spotting of blood in your stool or on the toilet paper. If you underwent a bowel prep for your procedure, then you may not have a normal bowel movement for a few days.  DIET: Your first meal following the procedure should be a light meal and then it is ok to progress to your normal diet.  A half-sandwich or bowl of soup is an example of a good first meal.  Heavy or fried foods are harder to digest and may make you feel nauseous or bloated.  Likewise meals heavy in dairy and vegetables can cause extra gas to form and this can also increase the bloating.  Drink plenty of fluids but you should avoid alcoholic beverages for 24 hours.  ACTIVITY: Your care partner should take you home directly after the procedure.  You should plan to take it easy, moving slowly for the rest of the day.  You can resume normal activity the day after the procedure however you should NOT DRIVE or use heavy machinery for 24 hours (because of the sedation medicines used during the test).    SYMPTOMS TO REPORT IMMEDIATELY: A gastroenterologist can be reached at any hour.  During normal business hours, 8:30 AM to 5:00 PM Monday through Friday,  call [phone].  After hours and on weekends, please call the GI answering service at [phone] who will take a message and have the physician on call contact you.   Following lower endoscopy (colonoscopy or flexible sigmoidoscopy):  Excessive amounts of blood in the stool  Significant tenderness or worsening of abdominal pains  Swelling of the abdomen that is new, acute  Fever of 100F or higher  Following upper endoscopy (EGD)  Vomiting of blood or coffee ground material  New chest pain or pain under the shoulder blades  Painful or persistently difficult swallowing  New shortness of breath  Fever of 100F or higher  Black, tarry-looking stools  FOLLOW UP: If any biopsies were taken you will be contacted by phone or by letter within the next 1-3 weeks.  Call your gastroenterologist if you have not heard about the biopsies in 3 weeks.  Our staff will call the home number listed on your records the next business day following your procedure to check on you and address any questions or concerns that you may have at that time regarding the information given to you following your procedure. This is a courtesy call and so if there is no answer at the home number and we have not heard from you through the emergency physician on call, we will assume that you have returned to your regular daily activities without incident.  SIGNATURES/CONFIDENTIALITY: You and/or your care   partner have signed paperwork which will be entered into your electronic medical record.  These signatures attest to the fact that that the information above on your After Visit Summary has been reviewed and is understood.  Full responsibility of the confidentiality of this discharge information lies with you and/or your care-partner.    Polyp, diverticulosis, high fiber diet informattion given.  Next colonoscopy will be scheduled according to results of pathology.  Dr. Marina Goodell will advise.               Patient did not experience any of the following events: a burn prior to discharge; a fall within the facility; wrong site/side/patient/procedure/implant event; or a hospital transfer or hospital admission upon discharge from the facility. [phone]) Patient did not have preoperative order for IV antibiotic SSI prophylaxis. [phone])

## 2011-12-07 NOTE — Progress Notes (Signed)
Patient did not experience any of the following events: a burn prior to discharge; a fall within the facility; wrong site/side/patient/procedure/implant event; or a hospital transfer or hospital admission upon discharge from the facility. (G8907) Patient did not have preoperative order for IV antibiotic SSI prophylaxis. (G8918)  

## 2011-12-07 NOTE — Op Note (Signed)
[facility] [address] [place], [address]   COLONOSCOPY PROCEDURE REPORT  PATIENT: [name], [name]  MR#: [id number] BIRTHDATE: 1940/07/30 , 70  yrs. old GENDER: Female ENDOSCOPIST: Roxy Cedar, MD REFERRED BY:.  Office PROCEDURE DATE:  12/07/2011 PROCEDURE:   Colonoscopy with snare polypectomy   x 1 ASA CLASS:   Class II INDICATIONS:average risk screening. MEDICATIONS: MAC sedation, administered by CRNA and propofol (Diprivan) 110mg  IV  DESCRIPTION OF PROCEDURE:   After the risks benefits and alternatives of the procedure were thoroughly explained, informed consent was obtained.  A digital rectal exam revealed no abnormalities of the rectum.   The LB [id number]  endoscope was introduced through the anus and advanced to the cecum, which was identified by both the appendix and ileocecal valve. No adverse events experienced.   The quality of the prep was good, using MoviPrep  The instrument was then slowly withdrawn as the colon was fully examined.      COLON FINDINGS: A diminutive polyp was found in the transverse colon.  A polypectomy was performed with a cold snare.  The resection was complete and the polyp tissue was completely retrieved.   Moderate diverticulosis was noted throughout the entire examined colon.   There was evidence of a prior colo-colonic surgical anastomosis in the sigmoid colon at 20 cm.   The colon mucosa was otherwise normal.  Retroflexed views revealed no abnormalities. The time to cecum=1 minutes 38 seconds.  Withdrawal time=19 minutes 55 seconds.  The scope was withdrawn and the procedure completed. COMPLICATIONS: There were no complications.  ENDOSCOPIC IMPRESSION: 1.   Diminutive polyp was found in the transverse colon; polypectomy was performed with a cold snare 2.   Moderate diverticulosis was noted throughout the entire examined colon 3.   There was evidence of a prior colo-colonic surgical  anastomosis in the sigmoid colon 4.   The colon mucosa was otherwise normal  RECOMMENDATIONS: 1. Repeat colonoscopy in 5 years if polyp adenomatous; otherwise 10 years   eSigned:  Roxy Cedar, MD 12/07/2011 3:33 PM  cc: Ralene Ok MD and The Patient   PATIENT NAME:  [name], [name] MR#: [id number]

## 2011-12-11 ENCOUNTER — Telehealth: Payer: Self-pay | Admitting: *Deleted

## 2011-12-11 NOTE — Telephone Encounter (Signed)
  Follow up Call-  Call back number 12/07/2011  Post procedure Call Back phone  # [phone]  Permission to leave phone message Yes     Patient questions:  Do you have a fever, pain , or abdominal swelling? yes Pain Score  2 *  Have you tolerated food without any problems? yes  Have you been able to return to your normal activities? yes  Do you have any questions about your discharge instructions: Diet   no Medications  no Follow up visit  no  Do you have questions or concerns about your Care? no  Actions: * If pain score is 4 or above: No action needed, pain <4.

## 2011-12-17 ENCOUNTER — Encounter: Payer: Self-pay | Admitting: Internal Medicine

## 2012-01-23 ENCOUNTER — Ambulatory Visit (INDEPENDENT_AMBULATORY_CARE_PROVIDER_SITE_OTHER): Payer: Medicare Other | Admitting: Internal Medicine

## 2012-01-23 ENCOUNTER — Encounter: Payer: Self-pay | Admitting: Internal Medicine

## 2012-01-23 VITALS — BP 114/70 | HR 88 | Ht 62.5 in | Wt 215.5 lb

## 2012-01-23 DIAGNOSIS — R159 Full incontinence of feces: Secondary | ICD-10-CM

## 2012-01-23 DIAGNOSIS — K5790 Diverticulosis of intestine, part unspecified, without perforation or abscess without bleeding: Secondary | ICD-10-CM

## 2012-01-23 DIAGNOSIS — Z86010 Personal history of colonic polyps: Secondary | ICD-10-CM

## 2012-01-23 DIAGNOSIS — K5730 Diverticulosis of large intestine without perforation or abscess without bleeding: Secondary | ICD-10-CM

## 2012-01-23 NOTE — Progress Notes (Signed)
HISTORY OF PRESENT ILLNESS:  [name] is a 71 y.o. female with the below listed medical history who was evaluated 11/08/2011 regarding surveillance colonoscopy. She had complications after surgery for diverticular disease. See that dictation for details. She subsequently underwent colonoscopy 12/07/2011. This revealed a diminutive adenomatous colon polyp which was removed as well as moderate diverticulosis and prior evidence of sigmoid colon resection. No other abnormalities including normal rectal exam. Followup colonoscopy in 5 years recommended. She presents today with a complaint of rare incontinence. She did not mention this at her last visit. Patient tells me that since her colostomy takedown, she has had approximately 10 episodes where she cannot discriminate between flatus and feces. During these times, generally in her car, she will have an episode of incontinence with formed stool. She has had no more than 2 or 3 episodes annually. The problem has not worsened in terms of frequency or severity. The only exacerbating factor seems to be biscuit with gravy. No nocturnal issues. She does not wear protective undergarment. She does have a history of chronic urinary incontinence which is more frequent  REVIEW OF SYSTEMS:  All non-GI ROS negative except for sinus and allergy, arthritis, back pain, visual change, cough, shortness of breath, increased urination with incontinence  Past Medical History  Diagnosis Date  . Asthma 1995  . Anemia 1990  . Anxiety   . Arthritis 2004  . Diverticulosis 2003  . GERD (gastroesophageal reflux disease)   . Colon polyp     Past Surgical History  Procedure Date  . Appendectomy 1966  . Cholecystectomy 2004  . Incisional hernia repair 2010  . Colon surgery 2008  . Tonsillectomy   . Cataract extraction     Bilateral  . Nephrectomy     Left  . Colostomy   . Colostomy takedown   . Heel spur surgery     Right, Metal Plate in Heel    Social  History [name]  reports that she has never smoked. She has never used smokeless tobacco. She reports that she does not drink alcohol or use illicit drugs.  family history includes Colon polyps in her other; Diabetes in her paternal aunt and paternal grandmother; Heart attack in her father; and Heart failure in her mother.  No Known Allergies     PHYSICAL EXAMINATION:  Vital signs: BP 114/70  Pulse 88  Ht 5' 2.5" (1.588 m)  Wt 215 lb 8 oz (97.75 kg)  BMI 38.79 kg/m2 General: Well-developed, well-nourished, no acute distress HEENT: Sclerae are anicteric, conjunctiva pink. Oral mucosa intact Lungs: Clear Heart: Regular Abdomen: soft, nontender, nondistended, no obvious ascites, no peritoneal signs, normal bowel sounds. No organomegaly. Extremities: No edema Psychiatric: alert and oriented x3. Cooperative     ASSESSMENT:  #1. Occasional inability to discriminate between rectal gas and feces. Chronic in stable. #2. Recent colonoscopy with diverticulosis and diminutive adenoma #3. History of complicated diverticular disease with surgical intervention, also complicated - as previously described   PLAN:  #1. Recommended to try to move her bowels in the morning before leaving the house #2. Recommend protective undergarments such as depends #3. Surveillance colonoscopy in 3 years #4. Resume general medical care with PCP

## 2012-01-23 NOTE — Patient Instructions (Addendum)
Please follow up with Dr. Perry as needed 

## 2012-02-13 ENCOUNTER — Encounter (INDEPENDENT_AMBULATORY_CARE_PROVIDER_SITE_OTHER): Payer: Medicare Other | Admitting: Ophthalmology

## 2012-02-13 DIAGNOSIS — H43819 Vitreous degeneration, unspecified eye: Secondary | ICD-10-CM

## 2012-02-13 DIAGNOSIS — H3530 Unspecified macular degeneration: Secondary | ICD-10-CM

## 2012-06-19 ENCOUNTER — Other Ambulatory Visit: Payer: Self-pay | Admitting: Internal Medicine

## 2012-06-19 ENCOUNTER — Ambulatory Visit
Admission: RE | Admit: 2012-06-19 | Discharge: 2012-06-19 | Disposition: A | Discharge status: Home / Self Care | Payer: Medicare Other | Source: Ambulatory Visit | Attending: Internal Medicine | Admitting: Internal Medicine

## 2012-06-19 DIAGNOSIS — R52 Pain, unspecified: Secondary | ICD-10-CM

## 2013-02-10 ENCOUNTER — Ambulatory Visit (INDEPENDENT_AMBULATORY_CARE_PROVIDER_SITE_OTHER): Payer: Medicare Other | Admitting: Ophthalmology

## 2013-02-23 ENCOUNTER — Ambulatory Visit (INDEPENDENT_AMBULATORY_CARE_PROVIDER_SITE_OTHER): Payer: Self-pay | Admitting: Ophthalmology

## 2013-03-09 ENCOUNTER — Ambulatory Visit (INDEPENDENT_AMBULATORY_CARE_PROVIDER_SITE_OTHER): Payer: Self-pay | Admitting: Ophthalmology

## 2013-04-06 ENCOUNTER — Ambulatory Visit (INDEPENDENT_AMBULATORY_CARE_PROVIDER_SITE_OTHER): Payer: Medicare Other | Admitting: Ophthalmology

## 2013-04-06 DIAGNOSIS — H43819 Vitreous degeneration, unspecified eye: Secondary | ICD-10-CM

## 2013-04-06 DIAGNOSIS — H4420 Degenerative myopia, unspecified eye: Secondary | ICD-10-CM

## 2013-04-06 DIAGNOSIS — H3530 Unspecified macular degeneration: Secondary | ICD-10-CM

## 2014-03-25 ENCOUNTER — Ambulatory Visit
Admission: RE | Admit: 2014-03-25 | Discharge: 2014-03-25 | Disposition: A | Discharge status: Home / Self Care | Payer: Medicare Other | Source: Ambulatory Visit | Attending: Internal Medicine | Admitting: Internal Medicine

## 2014-03-25 ENCOUNTER — Other Ambulatory Visit: Payer: Self-pay | Admitting: Internal Medicine

## 2014-03-25 DIAGNOSIS — K5900 Constipation, unspecified: Secondary | ICD-10-CM

## 2014-04-12 ENCOUNTER — Ambulatory Visit (INDEPENDENT_AMBULATORY_CARE_PROVIDER_SITE_OTHER): Payer: Medicare Other | Admitting: Ophthalmology

## 2014-04-13 ENCOUNTER — Ambulatory Visit (INDEPENDENT_AMBULATORY_CARE_PROVIDER_SITE_OTHER): Payer: PRIVATE HEALTH INSURANCE | Admitting: Ophthalmology

## 2014-04-13 DIAGNOSIS — H4423 Degenerative myopia, bilateral: Secondary | ICD-10-CM | POA: Diagnosis not present

## 2014-04-13 DIAGNOSIS — H3531 Nonexudative age-related macular degeneration: Secondary | ICD-10-CM | POA: Diagnosis not present

## 2014-04-13 DIAGNOSIS — H43813 Vitreous degeneration, bilateral: Secondary | ICD-10-CM

## 2015-04-15 ENCOUNTER — Ambulatory Visit (INDEPENDENT_AMBULATORY_CARE_PROVIDER_SITE_OTHER): Payer: PRIVATE HEALTH INSURANCE | Admitting: Ophthalmology

## 2015-04-28 ENCOUNTER — Ambulatory Visit (INDEPENDENT_AMBULATORY_CARE_PROVIDER_SITE_OTHER): Payer: PRIVATE HEALTH INSURANCE | Admitting: Ophthalmology

## 2015-04-28 DIAGNOSIS — H26492 Other secondary cataract, left eye: Secondary | ICD-10-CM

## 2015-04-28 DIAGNOSIS — H43813 Vitreous degeneration, bilateral: Secondary | ICD-10-CM

## 2015-04-28 DIAGNOSIS — H4423 Degenerative myopia, bilateral: Secondary | ICD-10-CM

## 2015-05-11 ENCOUNTER — Ambulatory Visit (INDEPENDENT_AMBULATORY_CARE_PROVIDER_SITE_OTHER): Payer: PRIVATE HEALTH INSURANCE | Admitting: Ophthalmology

## 2015-05-11 DIAGNOSIS — H2702 Aphakia, left eye: Secondary | ICD-10-CM

## 2015-06-11 ENCOUNTER — Ambulatory Visit (INDEPENDENT_AMBULATORY_CARE_PROVIDER_SITE_OTHER): Payer: PRIVATE HEALTH INSURANCE

## 2015-06-11 ENCOUNTER — Telehealth: Payer: Self-pay | Admitting: Radiology

## 2015-06-11 ENCOUNTER — Ambulatory Visit (INDEPENDENT_AMBULATORY_CARE_PROVIDER_SITE_OTHER): Payer: PRIVATE HEALTH INSURANCE | Admitting: Urgent Care

## 2015-06-11 VITALS — BP 118/68 | HR 100 | Temp 97.5°F | Resp 16 | Ht 63.0 in | Wt 219.0 lb

## 2015-06-11 DIAGNOSIS — R05 Cough: Secondary | ICD-10-CM | POA: Diagnosis not present

## 2015-06-11 DIAGNOSIS — R0789 Other chest pain: Secondary | ICD-10-CM | POA: Diagnosis not present

## 2015-06-11 DIAGNOSIS — R0602 Shortness of breath: Secondary | ICD-10-CM | POA: Diagnosis not present

## 2015-06-11 DIAGNOSIS — R059 Cough, unspecified: Secondary | ICD-10-CM

## 2015-06-11 DIAGNOSIS — J4530 Mild persistent asthma, uncomplicated: Secondary | ICD-10-CM | POA: Diagnosis not present

## 2015-06-11 LAB — COMPREHENSIVE METABOLIC PANEL
ALBUMIN: 3.9 g/dL (ref 3.6–5.1)
ALT: 15 U/L (ref 6–29)
AST: 12 U/L (ref 10–35)
Alkaline Phosphatase: 71 U/L (ref 33–130)
BILIRUBIN TOTAL: 0.5 mg/dL (ref 0.2–1.2)
BUN: 18 mg/dL (ref 7–25)
CALCIUM: 9 mg/dL (ref 8.6–10.4)
CHLORIDE: 101 mmol/L (ref 98–110)
CO2: 28 mmol/L (ref 20–31)
CREATININE: 1.36 mg/dL — AB (ref 0.60–0.93)
Glucose, Bld: 122 mg/dL — ABNORMAL HIGH (ref 65–99)
Potassium: 4.3 mmol/L (ref 3.5–5.3)
Sodium: 136 mmol/L (ref 135–146)
TOTAL PROTEIN: 5.8 g/dL — AB (ref 6.1–8.1)

## 2015-06-11 LAB — POCT CBC
Granulocyte percent: 75.7 %G (ref 37–80)
HEMATOCRIT: 44.5 % (ref 37.7–47.9)
Hemoglobin: 15.7 g/dL (ref 12.2–16.2)
LYMPH, POC: 1.6 (ref 0.6–3.4)
MCH, POC: 30.6 pg (ref 27–31.2)
MCHC: 35.3 g/dL (ref 31.8–35.4)
MCV: 86.8 fL (ref 80–97)
MID (cbc): 0.8 (ref 0–0.9)
MPV: 7.6 fL (ref 0–99.8)
POC Granulocyte: 7.6 — AB (ref 2–6.9)
POC LYMPH %: 15.9 % (ref 10–50)
POC MID %: 8.4 %M (ref 0–12)
Platelet Count, POC: 221 10*3/uL (ref 142–424)
RBC: 5.12 M/uL (ref 4.04–5.48)
RDW, POC: 13.2 %
WBC: 10 10*3/uL (ref 4.6–10.2)

## 2015-06-11 MED ORDER — CETIRIZINE HCL 10 MG PO TABS: 10.0000 mg | ORAL_TABLET | Freq: Every day | ORAL | Status: DC

## 2015-06-11 MED ORDER — IPRATROPIUM BROMIDE 0.02 % IN SOLN
0.5000 mg | Freq: Once | RESPIRATORY_TRACT | Status: AC
  Administered 2015-06-11: 0.5 mg via RESPIRATORY_TRACT

## 2015-06-11 MED ORDER — ALBUTEROL SULFATE (2.5 MG/3ML) 0.083% IN NEBU
2.5000 mg | INHALATION_SOLUTION | Freq: Once | RESPIRATORY_TRACT | Status: AC
  Administered 2015-06-11: 2.5 mg via RESPIRATORY_TRACT

## 2015-06-11 NOTE — Progress Notes (Signed)
MRN: [id number] DOB: [birth year]  Subjective:   [name] is a 75 y.o. female presenting for chief complaint of Sore Throat; Headache; and Cough  Reports 10 day history of productive cough, cough elicits chest pain and shob. Also has sore throat, headache, nausea, subjective fever. Patient was seen by her PCP, started on Azithromycin and short steroid course. She states that she feels no different. Admits history of asthma, has not used her albuterol inhaler for the past month. Denies smoking cigarettes.    [name] has a current medication list which includes the following prescription(s): lorazepam, mometasone-formoterol, omeprazole, and risperidone. Also has No Known Allergies.  [name]  has a past medical history of Asthma (1995); Anemia (1990); Anxiety; Arthritis (2004); Diverticulosis (2003); GERD (gastroesophageal reflux disease); and Colon polyp. Also  has past surgical history that includes Appendectomy (1966); Cholecystectomy (2004); Incisional hernia repair (2010); Colon surgery (2008); Tonsillectomy; Cataract extraction; Nephrectomy; Colostomy; Colostomy takedown; and Heel spur surgery.  Her family history includes Colon polyps in her other; Diabetes in her paternal aunt and paternal grandmother; Heart attack in her father; Heart failure in her mother.   Objective:   Vitals: BP 118/68 mmHg  Pulse 100  Temp(Src) 97.5 F (36.4 C) (Oral)  Resp 16  Ht 5\' 3"  (1.6 m)  Wt 219 lb (99.338 kg)  BMI 38.80 kg/m2  SpO2 93%  Physical Exam  Constitutional: She is oriented to person, place, and time. She appears well-developed and well-nourished.  HENT:  Mouth/Throat: Oropharynx is clear and moist.  Eyes: Right eye exhibits no discharge. Left eye exhibits no discharge. No scleral icterus.  Neck: Normal range of motion. Neck supple.  Cardiovascular: Normal rate, regular rhythm and intact distal pulses.   Pulmonary/Chest: No respiratory distress. She has wheezes (bilateral,  throughout). She has no rales.  Musculoskeletal: She exhibits no edema.  Lymphadenopathy:    She has no cervical adenopathy.  Neurological: She is alert and oriented to person, place, and time.  Skin: Skin is warm and dry.   Results for orders placed or performed in visit on 06/11/15 (from the past 24 hour(s))  POCT CBC     Status: Abnormal   Collection Time: 06/11/15 12:17 PM  Result Value Ref Range   WBC 10.0 4.6 - 10.2 K/uL   Lymph, poc 1.6 0.6 - 3.4   POC LYMPH PERCENT 15.9 10 - 50 %L   MID (cbc) 0.8 0 - 0.9   POC MID % 8.4 0 - 12 %M   POC Granulocyte 7.6 (A) 2 - 6.9   Granulocyte percent 75.7 37 - 80 %G   RBC 5.12 4.04 - 5.48 M/uL   Hemoglobin 15.7 12.2 - 16.2 g/dL   HCT, POC 44.5 37.7 - 47.9 %   MCV 86.8 80 - 97 fL   MCH, POC 30.6 27 - 31.2 pg   MCHC 35.3 31.8 - 35.4 g/dL   RDW, POC 13.2 %   Platelet Count, POC 221 142 - 424 K/uL   MPV 7.6 0 - 99.8 fL   Dg Chest 2 View  06/11/2015  CLINICAL DATA:  Cough shortness of breath atypical chest pain EXAM: CHEST  2 VIEW COMPARISON:  07/21/2007 FINDINGS: Stable mild cardiac enlargement. Vascular pattern normal. Minimal discoid atelectasis left lung base. No pleural effusion. IMPRESSION: No active cardiopulmonary disease. Electronically Signed   By: Skipper Cliche M.D.   On: 06/11/2015 13:00   Assessment and Plan :   1. Cough 2. Atypical chest pain 3. Shortness of  breath 4. Extrinsic asthma, mild persistent, uncomplicated - Chest x-ray reassuring. Patient notes significant improvement with nebulizer treatment, pulse oximetry improved from 92% to 96% s/p neb treatment. I recommended patient schedule her albuterol inhaler use to every 6 hours. She does not want to use steroids because of side effects. I offered Singulair but patient wants to follow up with her doctor.  [name], PA-C [facility] [phone] 06/11/2015 11:29 AM

## 2015-06-11 NOTE — Patient Instructions (Addendum)
Because you received labwork today, you will receive an invoice from [employer]. Please contact [facility] at [phone] with questions or concerns regarding your invoice. Our billing staff will not be able to assist you with those questions.  You will be contacted with the lab results as soon as they are available. The fastest way to get your results is to activate your My Chart account. Instructions are located on the last page of this paperwork. If you have not heard from [place] regarding the results in 2 weeks, please contact this office.  Because you received an x-ray today, you will receive an invoice from [facility]. Please contact [facility] at [phone] with questions or concerns regarding your invoice. Our billing staff will not be able to assist you with those questions.    Asthma, Adult Asthma is a recurring condition in which the airways tighten and narrow. Asthma can make it difficult to breathe. It can cause coughing, wheezing, and shortness of breath. Asthma episodes, also called asthma attacks, range from minor to life-threatening. Asthma cannot be cured, but medicines and lifestyle changes can help control it. CAUSES Asthma is believed to be caused by inherited (genetic) and environmental factors, but its exact cause is unknown. Asthma may be triggered by allergens, lung infections, or irritants in the air. Asthma triggers are different for each person. Common triggers include:   Animal dander.  Dust mites.  Cockroaches.  Pollen from trees or grass.  Mold.  Smoke.  Air pollutants such as dust, household cleaners, hair sprays, aerosol sprays, paint fumes, strong chemicals, or strong odors.  Cold air, weather changes, and winds (which increase molds and pollens in the air).  Strong emotional expressions such as crying or laughing hard.  Stress.  Certain medicines (such as aspirin) or types of drugs (such as  beta-blockers).  Sulfites in foods and drinks. Foods and drinks that may contain sulfites include dried fruit, potato chips, and sparkling grape juice.  Infections or inflammatory conditions such as the flu, a cold, or an inflammation of the nasal membranes (rhinitis).  Gastroesophageal reflux disease (GERD).  Exercise or strenuous activity. SYMPTOMS Symptoms may occur immediately after asthma is triggered or many hours later. Symptoms include:  Wheezing.  Excessive nighttime or early morning coughing.  Frequent or severe coughing with a common cold.  Chest tightness.  Shortness of breath. DIAGNOSIS  The diagnosis of asthma is made by a review of your medical history and a physical exam. Tests may also be performed. These may include:  Lung function studies. These tests show how much air you breathe in and out.  Allergy tests.  Imaging tests such as X-rays. TREATMENT  Asthma cannot be cured, but it can usually be controlled. Treatment involves identifying and avoiding your asthma triggers. It also involves medicines. There are 2 classes of medicine used for asthma treatment:   Controller medicines. These prevent asthma symptoms from occurring. They are usually taken every day.  Reliever or rescue medicines. These quickly relieve asthma symptoms. They are used as needed and provide short-term relief. Your health care provider will help you create an asthma action plan. An asthma action plan is a written plan for managing and treating your asthma attacks. It includes a list of your asthma triggers and how they may be avoided. It also includes information on when medicines should be taken and when their dosage should be changed. An action plan may also involve the use of a device called a peak flow meter.  A peak flow meter measures how well the lungs are working. It helps you monitor your condition. HOME CARE INSTRUCTIONS   Take medicines only as directed by your health care  provider. Speak with your health care provider if you have questions about how or when to take the medicines.  Use a peak flow meter as directed by your health care provider. Record and keep track of readings.  Understand and use the action plan to help minimize or stop an asthma attack without needing to seek medical care.  Control your home environment in the following ways to help prevent asthma attacks:  Do not smoke. Avoid being exposed to secondhand smoke.  Change your heating and air conditioning filter regularly.  Limit your use of fireplaces and wood stoves.  Get rid of pests (such as roaches and mice) and their droppings.  Throw away plants if you see mold on them.  Clean your floors and dust regularly. Use unscented cleaning products.  Try to have someone else vacuum for you regularly. Stay out of rooms while they are being vacuumed and for a short while afterward. If you vacuum, use a dust mask from a hardware store, a double-layered or microfilter vacuum cleaner bag, or a vacuum cleaner with a HEPA filter.  Replace carpet with wood, tile, or vinyl flooring. Carpet can trap dander and dust.  Use allergy-proof pillows, mattress covers, and box spring covers.  Wash bed sheets and blankets every week in hot water and dry them in a dryer.  Use blankets that are made of polyester or cotton.  Clean bathrooms and kitchens with bleach. If possible, have someone repaint the walls in these rooms with mold-resistant paint. Keep out of the rooms that are being cleaned and painted.  Wash hands frequently. SEEK MEDICAL CARE IF:   You have wheezing, shortness of breath, or a cough even if taking medicine to prevent attacks.  The colored mucus you cough up (sputum) is thicker than usual.  Your sputum changes from clear or white to yellow, green, gray, or bloody.  You have any problems that may be related to the medicines you are taking (such as a rash, itching, swelling, or  trouble breathing).  You are using a reliever medicine more than 2-3 times per week.  Your peak flow is still at 50-79% of your personal best after following your action plan for 1 hour.  You have a fever. SEEK IMMEDIATE MEDICAL CARE IF:   You seem to be getting worse and are unresponsive to treatment during an asthma attack.  You are short of breath even at rest.  You get short of breath when doing very little physical activity.  You have difficulty eating, drinking, or talking due to asthma symptoms.  You develop chest pain.  You develop a fast heartbeat.  You have a bluish color to your lips or fingernails.  You are light-headed, dizzy, or faint.  Your peak flow is less than 50% of your personal best.   This information is not intended to replace advice given to you by your health care provider. Make sure you discuss any questions you have with your health care provider.   Document Released: 03/26/2005 Document Revised: 12/15/2014 Document Reviewed: 10/23/2012 Elsevier Interactive Patient Education [employer].

## 2015-06-11 NOTE — Telephone Encounter (Signed)
Patient would like Rx for Zyrtec so she can use her flex spending to pay for this. She is aware this will not be addressed today, she was instructed to check with the pharmacy tomorrow

## 2015-06-11 NOTE — Telephone Encounter (Signed)
Script printed. Patient can pick this up at her convenience.

## 2015-06-12 ENCOUNTER — Encounter: Payer: Self-pay | Admitting: Urgent Care

## 2015-06-16 ENCOUNTER — Telehealth: Payer: Self-pay | Admitting: Emergency Medicine

## 2015-06-16 NOTE — Telephone Encounter (Signed)
Patient had a severe coughing episode now resolved. On pred,.albuterol inhaler, and cough meds. Feeling better now. Will take an extra prednisone tonight. She does not want to go to the ER. Will use her inhaler every 4 hours. To ER if worsening. RTC to office in AM if not better.

## 2015-06-19 ENCOUNTER — Ambulatory Visit (INDEPENDENT_AMBULATORY_CARE_PROVIDER_SITE_OTHER): Payer: PRIVATE HEALTH INSURANCE | Admitting: Family Medicine

## 2015-06-19 ENCOUNTER — Telehealth: Payer: Self-pay

## 2015-06-19 VITALS — BP 112/80 | HR 98 | Temp 97.8°F | Resp 16 | Ht 63.0 in | Wt 217.5 lb

## 2015-06-19 DIAGNOSIS — J209 Acute bronchitis, unspecified: Secondary | ICD-10-CM | POA: Diagnosis not present

## 2015-06-19 MED ORDER — METHYLPREDNISOLONE ACETATE 80 MG/ML IJ SUSP
80.0000 mg | Freq: Once | INTRAMUSCULAR | Status: AC
  Administered 2015-06-19: 80 mg via INTRAMUSCULAR

## 2015-06-19 MED ORDER — AMOXICILLIN 875 MG PO TABS: 875.0000 mg | ORAL_TABLET | Freq: Two times a day (BID) | ORAL | Status: DC

## 2015-06-19 NOTE — Patient Instructions (Addendum)

## 2015-06-19 NOTE — Telephone Encounter (Signed)
Patient called to state that she is still having severe episodes of coughing.  She has finished her predisone treatment.  I advised, based on Dr Perfecto Kingdom telephone message, that she should come in to be re-evaluated.  She asked if I would place a message asking for additional/alternate medication in case she is not able to come in to be seen.  CB#: [phone]

## 2015-06-19 NOTE — Progress Notes (Signed)
   Subjective:    Patient ID: [name], female    DOB: 1941-03-07, 75 y.o.   MRN: [id number] By signing my name below, I, Zola Button, attest that this documentation has been prepared under the direction and in the presence of Robyn Haber, MD.  Electronically Signed: Zola Button, Medical Scribe. 06/19/2015. 3:56 PM.  HPI HPI Comments: AZIAH CEPERO is a 75 y.o. female with a history of asthma who presents to the [facility] complaining of gradual onset, productive cough of mucus and blood that started about 1 week ago. Patient reports having associated rhinorrhea and mild chest pain. She has been put on Azithromycin and prednisone by her PCP. She did use her inhaler before she came here today. Patient denies fever. She also denies history of pneumonia.  Patient says that she's been skipping doses of her medications for her asthma.  PCP: Dr. Mellody Drown  Review of Systems  Constitutional: Negative for fever.  HENT: Positive for rhinorrhea.   Respiratory: Positive for cough.   Cardiovascular: Positive for chest pain.       Objective:   Physical Exam CONSTITUTIONAL: Well developed/well nourished HEAD: Normocephalic/atraumatic EYES: EOM/PERRL ENMT: Mucous membranes moist. Ears normal. Throat clear. NECK: supple no meningeal signs SPINE: entire spine nontender CV: S1/S2 noted, no murmurs/rubs/gallops noted LUNGS: Bilateral expiratory wheezes. ABDOMEN: soft, nontender, no rebound or guarding GU: no cva tenderness NEURO: Pt is awake/alert, moves all extremitiesx4 EXTREMITIES: pulses normal, full ROM SKIN: warm, color normal PSYCH: no abnormalities of mood noted        Assessment & Plan:   This chart was scribed in my presence and reviewed by me personally.    ICD-9-CM ICD-10-CM   1. Acute bronchitis, unspecified organism 466.0 J20.9 methylPREDNISolone acetate (DEPO-MEDROL) injection 80 mg     amoxicillin (AMOXIL) 875 MG tablet      Signed, Robyn Haber, MD

## 2015-06-19 NOTE — Telephone Encounter (Signed)
Patient came to the office and was seen by Dr. Joseph Art

## 2015-06-25 ENCOUNTER — Ambulatory Visit (INDEPENDENT_AMBULATORY_CARE_PROVIDER_SITE_OTHER): Payer: PRIVATE HEALTH INSURANCE | Admitting: Internal Medicine

## 2015-06-25 VITALS — BP 134/72 | HR 83 | Temp 97.6°F | Resp 16 | Ht 63.0 in | Wt 215.0 lb

## 2015-06-25 DIAGNOSIS — B370 Candidal stomatitis: Secondary | ICD-10-CM

## 2015-06-25 MED ORDER — FLUCONAZOLE 150 MG PO TABS: 150.0000 mg | ORAL_TABLET | Freq: Once | ORAL | Status: DC

## 2015-06-25 NOTE — Patient Instructions (Signed)
     IF you received an x-ray today, you will receive an invoice from [facility]. Please contact [facility] at [phone] with questions or concerns regarding your invoice.   IF you received labwork today, you will receive an invoice from [facility]. Please contact [facility] at [phone] with questions or concerns regarding your invoice.   Our billing staff will not be able to assist you with questions regarding bills from these companies.  You will be contacted with the lab results as soon as they are available. The fastest way to get your results is to activate your My Chart account. Instructions are located on the last page of this paperwork. If you have not heard from us regarding the results in 2 weeks, please contact this office.      

## 2015-06-25 NOTE — Progress Notes (Signed)
Subjective:  By signing my name below, I, [name], attest that this documentation has been prepared under the direction and in the presence of [name], MD. Electronically Signed: Moises Logan, [place]. 06/25/2015 , 3:43 PM .  Patient was seen in Room 7 .   Patient ID: [name], female    DOB: Mar 30, 1941, 75 y.o.   MRN: [id number] Chief Complaint  Patient presents with  . Sore Throat    x 3 day throat burning "due to albuterol" - using every 3 hours per pt  . Cough  . Follow-up    Bronchitis   HPI NYLAH Logan is a 75 y.o. female who has h/o asthma presents to [facility] for follow up of bronchitis.  She used an albuterol inhaler and burned the inside of her mouth. She last used it yesterday morning. She used some red pepper jelly as well and the combination was really bad. She's having burning sore throat with cough. She was seen by Dr. Joseph Art 6 days ago. She was prescribed amoxicillin during that visit.   She had a sick contact (her sister) and they both went out to eat a restaurant together. She would wake up coughing in the morning.   Patient Active Problem List   Diagnosis Date Noted  . Asthma 12/07/2011  . Arthritis 12/07/2011  . 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 Obesity  12/07/2011  . DIVERTICULOSIS-COLON 12/01/2008  . ABDOMINAL PAIN-RLQ 12/01/2008    Current outpatient prescriptions:  .  albuterol (PROVENTIL HFA;VENTOLIN HFA) 108 (90 Base) MCG/ACT inhaler, Inhale into the lungs every 6 (six) hours as needed for wheezing or shortness of breath., Disp: , Rfl:  .  amoxicillin (AMOXIL) 875 MG tablet, Take 1 tablet (875 mg total) by mouth 2 (two) times daily., Disp: 20 tablet, Rfl: 0 .  cetirizine (ZYRTEC) 10 MG tablet, Take 1 tablet (10 mg total) by mouth daily., Disp: 30 tablet, Rfl: 11 .  LORazepam (ATIVAN) 2 MG tablet, Take 2 mg by mouth at bedtime., Disp: , Rfl:  .  mometasone-formoterol (DULERA) 100-5 MCG/ACT AERO,  Inhale 2 puffs into the lungs 2 (two) times daily., Disp: , Rfl:  .  Omeprazole (PRILOSEC PO), Take 1 tablet by mouth as needed., Disp: , Rfl:  .  risperiDONE (RISPERDAL) 2 MG tablet, Reported on 06/19/2015, Disp: , Rfl:  .  fluconazole (DIFLUCAN) 150 MG tablet, Take 1 tablet (150 mg total) by mouth once. Repeat 3 days in a row, Disp: 3 tablet, Rfl: 0  Review of Systems  Constitutional: Negative for fever, chills and fatigue.  HENT: Positive for congestion and sore throat. Negative for rhinorrhea.   Respiratory: Positive for cough and wheezing. Negative for shortness of breath.   Gastrointestinal: Negative for nausea, vomiting, abdominal pain, diarrhea and constipation.       Objective:   Physical Exam  Constitutional: She is oriented to person, place, and time. She appears well-developed and well-nourished. No distress.  HENT:  Head: Normocephalic and atraumatic.  Lacey white in buccal mucosa  Eyes: EOM are normal. Pupils are equal, round, and reactive to light.  Neck: Neck supple.  Cardiovascular: Normal rate.   Pulmonary/Chest: Effort normal. No respiratory distress.  Musculoskeletal: Normal range of motion.  Neurological: She is alert and oriented to person, place, and time.  Skin: Skin is warm and dry.  Psychiatric: She has a normal mood and affect. Her behavior is normal.  Nursing note and vitals reviewed.   BP 134/72 mmHg  Pulse 83  Temp(Src) 97.6 F (36.4 C) (Oral)  Resp 16  Ht 5\' 3"  (1.6 m)  Wt 215 lb (97.523 kg)  BMI 38.09 kg/m2  SpO2 94%    Assessment & Plan:  I have completed the patient encounter in its entirety as documented by the scribe, with editing by me where necessary. Elisabet Gutzmer P. Laney Pastor, M.D. [facility]  Meds ordered this encounter  Medications  . albuterol (PROVENTIL HFA;VENTOLIN HFA) 108 (90 Base) MCG/ACT inhaler    Sig: Inhale into the lungs every 6 (six) hours as needed for wheezing or shortness of breath.  . fluconazole (DIFLUCAN) 150 MG tablet      Sig: Take 1 tablet (150 mg total) by mouth once. Repeat 3 days in a row    Dispense:  3 tablet    Refill:  0

## 2015-06-27 ENCOUNTER — Telehealth: Payer: Self-pay

## 2015-06-27 NOTE — Telephone Encounter (Signed)
Uncertain why sore--we should recheck to see why?? Or she can see her dentist or pcp dr Abbott Pao

## 2015-06-27 NOTE — Telephone Encounter (Signed)
Started medicine Saturday and since then she has been using her inhaler and it has made the thrush worse.  She is finished with thrush medication now and wants to go back to using the inhaler but her mouth is very sore.  Please advise.   [phone]

## 2015-06-28 NOTE — Telephone Encounter (Signed)
LMVM advising patient to RTC or follow up with dentist or PCP.

## 2015-07-07 ENCOUNTER — Other Ambulatory Visit (HOSPITAL_COMMUNITY): Payer: Self-pay | Admitting: Psychiatry

## 2015-07-07 DIAGNOSIS — IMO0002 Reserved for concepts with insufficient information to code with codable children: Secondary | ICD-10-CM | POA: Insufficient documentation

## 2015-07-07 DIAGNOSIS — Q600 Renal agenesis, unilateral: Secondary | ICD-10-CM | POA: Insufficient documentation

## 2015-07-07 DIAGNOSIS — Z905 Acquired absence of kidney: Secondary | ICD-10-CM | POA: Insufficient documentation

## 2015-07-07 DIAGNOSIS — N3941 Urge incontinence: Secondary | ICD-10-CM | POA: Insufficient documentation

## 2015-08-22 ENCOUNTER — Other Ambulatory Visit: Payer: Self-pay | Admitting: Internal Medicine

## 2015-08-22 DIAGNOSIS — R109 Unspecified abdominal pain: Secondary | ICD-10-CM

## 2015-08-29 ENCOUNTER — Other Ambulatory Visit: Payer: Medicare Other

## 2015-10-20 ENCOUNTER — Ambulatory Visit (INDEPENDENT_AMBULATORY_CARE_PROVIDER_SITE_OTHER): Payer: PRIVATE HEALTH INSURANCE | Admitting: Urgent Care

## 2015-10-20 VITALS — BP 122/74 | HR 84 | Temp 97.6°F | Resp 18 | Ht 63.0 in | Wt 218.0 lb

## 2015-10-20 DIAGNOSIS — M25511 Pain in right shoulder: Secondary | ICD-10-CM

## 2015-10-20 MED ORDER — PREDNISONE 20 MG PO TABS: ORAL_TABLET | ORAL | Status: DC

## 2015-10-20 NOTE — Progress Notes (Signed)
    MRN: [id number] DOB: [birth year]  Subjective:   [name] is a 75 y.o. female presenting for chief complaint of Shoulder Pain  Reports 10 month history of worsening right side that started after patient suffered a fall, landed on her shoulder against a door. Pain started of as a slight ache, but in the past 2-3 weeks has become severe shoulder pain. Pain is sharp in nature, radiates into her bicep. 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 She has also had a lidocaine shoulder injection with very temporary relief. Patient has Dr. Maxie Better with [facility], she plans on having a knee surgery after she completes an annual physical with her PCP, Dr. Billey Chang.  [name] has a current medication list which includes the following prescription(s): lorazepam, mometasone-formoterol, omeprazole, albuterol, cetirizine, and risperidone. Also has No Known Allergies.  [name]  has a past medical history of Asthma (1995); Anemia (1990); Anxiety; Arthritis (2004); Diverticulosis (2003); GERD (gastroesophageal reflux disease); and Colon polyp. Also  has past surgical history that includes Appendectomy (1966); Cholecystectomy (2004); Incisional hernia repair (2010); Colon surgery (2008); Tonsillectomy; Cataract extraction; Nephrectomy; Colostomy; Colostomy takedown; and Heel spur surgery.  Objective:   Vitals: BP 122/74 mmHg  Pulse 84  Temp(Src) 97.6 F (36.4 C) (Oral)  Resp 18  Ht 5\' 3"  (1.6 m)  Wt 218 lb (98.884 kg)  BMI 38.63 kg/m2  SpO2 98%  Physical Exam  Constitutional: She is oriented to person, place, and time. She appears well-developed and well-nourished.  Cardiovascular: Normal rate.   Pulmonary/Chest: Effort normal.  Musculoskeletal:       Right shoulder: She exhibits decreased range of motion (abduction, external rotation) and bony tenderness (over AC joint, scapular border). She exhibits no swelling, no effusion, no  crepitus, no deformity, no laceration, no spasm and normal strength.  Neurological: She is alert and oriented to person, place, and time. She has normal reflexes.  Skin: Skin is warm and dry.   Assessment and Plan :   1. 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 I offered her oral steroid course, counseled her on using APAP for arthritic pain. RTC as needed.  [name], PA-C [facility] [phone] 10/20/2015 6:14 PM

## 2015-10-20 NOTE — Patient Instructions (Addendum)
Tylenol You may take 750mg  every 6 hours or 1,000mg  every 8 hours for pain and inflammation associated with your arthritis.    Osteoarthritis Osteoarthritis is a disease that causes soreness and inflammation of a joint. It occurs when the cartilage at the affected joint wears down. Cartilage acts as a cushion, covering the ends of bones where they meet to form a joint. Osteoarthritis is the most common form of arthritis. It often occurs in older people. The joints affected most often by this condition include those in the:  Ends of the fingers.  Thumbs.  Neck.  Lower back.  Knees.  Hips. CAUSES  Over time, the cartilage that covers the ends of bones begins to wear away. This causes bone to rub on bone, producing pain and stiffness in the affected joints.  RISK FACTORS Certain factors can increase your chances of having osteoarthritis, including:  Older age.  Excessive body weight.  Overuse of joints.  Previous joint injury. SIGNS AND SYMPTOMS   Pain, swelling, and stiffness in the joint.  Over time, the joint may lose its normal shape.  Small deposits of bone (osteophytes) may grow on the edges of the joint.  Bits of bone or cartilage can break off and float inside the joint space. This may cause more pain and damage. DIAGNOSIS  Your health care provider will do a physical exam and ask about your symptoms. Various tests may be ordered, such as:  X-rays of the affected joint.  Blood tests to rule out other types of arthritis. Additional tests may be used to diagnose your condition. TREATMENT  Goals of treatment are to control pain and improve joint function. Treatment plans may include:  A prescribed exercise program that allows for rest and joint relief.  A weight control plan.  Pain relief techniques, such as:  Properly applied heat and cold.  Electric pulses delivered to nerve endings under the skin (transcutaneous electrical nerve stimulation  [TENS]).  Massage.  Certain nutritional supplements.  Medicines to control pain, such as:  Acetaminophen.  Nonsteroidal anti-inflammatory drugs (NSAIDs), such as naproxen.  Narcotic or central-acting agents, such as tramadol.  Corticosteroids. These can be given orally or as an injection.  Surgery to reposition the bones and relieve pain (osteotomy) or to remove loose pieces of bone and cartilage. Joint replacement may be needed in advanced states of osteoarthritis. HOME CARE INSTRUCTIONS   Take medicines only as directed by your health care provider.  Maintain a healthy weight. Follow your health care provider's instructions for weight control. This may include dietary instructions.  Exercise as directed. Your health care provider can recommend specific types of exercise. These may include:  Strengthening exercises. These are done to strengthen the muscles that support joints affected by arthritis. They can be performed with weights or with exercise bands to add resistance.  Aerobic activities. These are exercises, such as brisk walking or low-impact aerobics, that get your heart pumping.  Range-of-motion activities. These keep your joints limber.  Balance and agility exercises. These help you maintain daily living skills.  Rest your affected joints as directed by your health care provider.  Keep all follow-up visits as directed by your health care provider. SEEK MEDICAL CARE IF:   Your skin turns red.  You develop a rash in addition to your joint pain.  You have worsening joint pain.  You have a fever along with joint or muscle aches. SEEK IMMEDIATE MEDICAL CARE IF:  You have a significant loss of weight or appetite.  You have night sweats. [place] of Arthritis and Musculoskeletal and Skin Diseases: www.niams.SouthExposed.es  [employer] on Aging: http://kim-miller.com/  American College of Rheumatology: www.rheumatology.org   This  information is not intended to replace advice given to you by your health care provider. Make sure you discuss any questions you have with your health care provider.   Document Released: 03/26/2005 Document Revised: 04/16/2014 Document Reviewed: 12/01/2012 Elsevier Interactive Patient Education 2016 [employer].     IF you received an x-ray today, you will receive an invoice from [facility]. Please contact [facility] at [phone] with questions or concerns regarding your invoice.   IF you received labwork today, you will receive an invoice from [employer] [facility]. Please contact [facility] at [phone] with questions or concerns regarding your invoice.   Our billing staff will not be able to assist you with questions regarding bills from these companies.  You will be contacted with the lab results as soon as they are available. The fastest way to get your results is to activate your My Chart account. Instructions are located on the last page of this paperwork. If you have not heard from [place] regarding the results in 2 weeks, please contact this office.

## 2015-10-26 ENCOUNTER — Telehealth: Payer: Self-pay

## 2015-10-26 NOTE — Telephone Encounter (Signed)
Thank you for your help with this patient. She was given the correct advice, offered the appointment. There's nothing else we can do. I hope she gets the treatment she needs.

## 2015-10-26 NOTE — Telephone Encounter (Signed)
Pt called at 1100 stating that the prednizone and the tylenol is not helping her shoulder pain, and has called twice and asking for an appt and we offered her a 400 time and she declined stating she could not wait that long and was calling  [phone]  Best number  [phone]

## 2015-10-31 ENCOUNTER — Ambulatory Visit (INDEPENDENT_AMBULATORY_CARE_PROVIDER_SITE_OTHER): Payer: PRIVATE HEALTH INSURANCE | Admitting: Physician Assistant

## 2015-10-31 VITALS — BP 118/70 | HR 81 | Temp 97.4°F | Resp 18 | Ht 63.0 in | Wt 216.4 lb

## 2015-10-31 DIAGNOSIS — L309 Dermatitis, unspecified: Secondary | ICD-10-CM

## 2015-10-31 MED ORDER — TRIAMCINOLONE ACETONIDE 0.1 % EX CREA: TOPICAL_CREAM | Freq: Two times a day (BID) | CUTANEOUS | Status: DC

## 2015-10-31 MED ORDER — TRIAMCINOLONE ACETONIDE 0.1 % EX CREA: 1.0000 "application " | TOPICAL_CREAM | Freq: Two times a day (BID) | CUTANEOUS | 0 refills | Status: DC

## 2015-10-31 NOTE — Progress Notes (Signed)
   [name]  MRN: [id number] DOB: 03/02/1941  Subjective:  [name] is a 75 y.o. female seen in office today for a chief complaint of poison ivy x 4 days on right hand, arms, and left leg. Pt has a lot of poison ivy in her back yard and accidentally let her dog out in the backyard 4 days ago. After letting the dog in, she played with him and let him lay on her. Later, she noticed the rash forming on her arms and leg.  Denies SOB, trouble swallowing, and swelling.Has tried zyrtec and caladryl cream with mild relief of itching. However, patient concerned because the rash has not started disappearing yet. Of note, patient completed a 5 day course of  prednisone on 10/28/15 for back pain.  .Review of Systems  As above  Patient Active Problem List   Diagnosis Date Noted  . Asthma 12/07/2011  . Arthritis 12/07/2011  . 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 Obesity  12/07/2011  . DIVERTICULOSIS-COLON 12/01/2008  . ABDOMINAL PAIN-RLQ 12/01/2008    Current Outpatient Prescriptions on File Prior to Visit  Medication Sig Dispense Refill  . cetirizine (ZYRTEC) 10 MG tablet Take 1 tablet (10 mg total) by mouth daily. 30 tablet 11  . LORazepam (ATIVAN) 2 MG tablet Take 2 mg by mouth at bedtime.    Marland Kitchen albuterol (PROVENTIL HFA;VENTOLIN HFA) 108 (90 Base) MCG/ACT inhaler Inhale into the lungs every 6 (six) hours as needed for wheezing or shortness of breath.    . mometasone-formoterol (DULERA) 100-5 MCG/ACT AERO Inhale 2 puffs into the lungs 2 (two) times daily.    . Omeprazole (PRILOSEC PO) Take 1 tablet by mouth as needed.    . predniSONE (DELTASONE) 20 MG tablet Take 2 tablets daily with breakfast. (Patient not taking: Reported on 10/31/2015) 10 tablet 0  . risperiDONE (RISPERDAL) 2 MG tablet Reported on 10/20/2015     No current facility-administered medications on file prior to visit.     Allergies  Allergen Reactions  . Haloperidol Anaphylaxis  . Aripiprazole  Other (See Comments)    unknown unknown    Objective:  BP 118/70   Pulse 81   Temp 97.4 F (36.3 C) (Oral)   Resp 18   Ht 5\' 3"  (1.6 m)   Wt 216 lb 6.4 oz (98.2 kg)   SpO2 94%   BMI 38.33 kg/m   Physical Exam  Constitutional: She is oriented to person, place, and time and well-developed, well-nourished, and in no distress.  HENT:  Head: Normocephalic and atraumatic.  Eyes: Conjunctivae are normal.  Neck: Normal range of motion.  Pulmonary/Chest: Effort normal.  Neurological: She is alert and oriented to person, place, and time. Gait normal.  Skin: Skin is warm and dry.  Linear eyrthematic papular rash noted on right thumb  Multiple clusters of erythematic papules noted on left forearm.  One 48mm erythematic papule noted on left leg.     Psychiatric: Affect normal.  Vitals reviewed.   Assessment and Plan :   1. Dermatitis -consistent with poison ivy - triamcinolone cream (KENALOG) 0.1 %; Apply 1 application topically 2 (two) times daily.  Dispense: 30 g; Refill: 0 -continue calamine lotion -instructed to wash dog using gloves    Tenna Delaine PA-C  [facility] 10/31/2015 9:49 AM

## 2015-10-31 NOTE — Patient Instructions (Addendum)
  Poison [employer] ivy is a rash caused by touching the leaves of the poison ivy plant. The rash often shows up 48 hours later. You might just have bumps, redness, and itching. Sometimes, blisters appear and break open. Your eyes may get puffy (swollen). Poison ivy often heals in 2 to 3 weeks without treatment. HOME CARE  If you touch poison ivy:  Wash your skin with soap and water right away. Wash under your fingernails. Do not rub the skin very hard.  Wash any clothes you were wearing.  Avoid poison ivy in the future. Poison ivy has 3 leaves on a stem.  Use medicine to help with itching as told by your doctor. Do not drive when you take this medicine.  Keep open sores dry, clean, and covered with a bandage and medicated cream, if needed.  Ask your doctor about medicine for children. GET HELP RIGHT AWAY IF:  You have open sores.  Redness spreads beyond the area of the rash.  There is yellowish white fluid (pus) coming from the rash.  Pain gets worse.  You have a temperature by mouth above 102 F (38.9 C), not controlled by medicine. MAKE SURE YOU:  Understand these instructions.  Will watch your condition.  Will get help right away if you are not doing well or get worse.   This information is not intended to replace advice given to you by your health care provider. Make sure you discuss any questions you have with your health care provider.   Document Released: 04/28/2010 Document Revised: 06/18/2011 Document Reviewed: 09/01/2014 Elsevier Interactive Patient Education 2016 [employer].   IF you received an x-ray today, you will receive an invoice from [facility]. Please contact [facility] at [phone] with questions or concerns regarding your invoice.   IF you received labwork today, you will receive an invoice from [facility]. Please contact [facility] at [phone] with questions or concerns regarding your invoice.    Our billing staff will not be able to assist you with questions regarding bills from these companies.  You will be contacted with the lab results as soon as they are available. The fastest way to get your results is to activate your My Chart account. Instructions are located on the last page of this paperwork. If you have not heard from [place] regarding the results in 2 weeks, please contact this office.

## 2015-11-05 ENCOUNTER — Telehealth: Payer: Self-pay | Admitting: *Deleted

## 2015-11-05 DIAGNOSIS — L309 Dermatitis, unspecified: Secondary | ICD-10-CM

## 2015-11-05 NOTE — Telephone Encounter (Signed)
Patient states her rash isn't better, and the kenalog cream isn't working.     She would like something else called in for her. She did just finish a round of prednisone for back pain 10/20/15.  [phone]

## 2015-11-07 MED ORDER — TRIAMCINOLONE ACETONIDE 0.5 % EX CREA: 1.0000 "application " | TOPICAL_CREAM | Freq: Three times a day (TID) | CUTANEOUS | 0 refills | Status: DC

## 2015-11-07 NOTE — Telephone Encounter (Signed)
Pt advised.

## 2015-11-07 NOTE — Telephone Encounter (Signed)
Please call patient and let her know that I have called in triamcinolone 0.5% cream and this should be much stronger than the cream she is currently taking. Tell her to stop the 0.1% cream and start using this 0.5% I just prescribed. Also, let her know that poison ivy can take up to 14 days to clear. She can also use calamine lotion for symptom relief. Please tell her to come back in one week if she is not any better.   Tenna Delaine, PA-C  [facility] 11/07/2015 8:35 AM

## 2015-11-14 ENCOUNTER — Ambulatory Visit: Payer: Medicare Other | Admitting: Family Medicine

## 2015-11-15 DIAGNOSIS — F419 Anxiety disorder, unspecified: Secondary | ICD-10-CM | POA: Insufficient documentation

## 2015-11-15 DIAGNOSIS — K219 Gastro-esophageal reflux disease without esophagitis: Secondary | ICD-10-CM | POA: Insufficient documentation

## 2015-11-17 ENCOUNTER — Encounter (HOSPITAL_COMMUNITY): Payer: Self-pay

## 2015-11-17 ENCOUNTER — Emergency Department (HOSPITAL_COMMUNITY)
Admission: EM | Admit: 2015-11-17 | Discharge: 2015-11-17 | Disposition: A | Discharge status: Home / Self Care | Payer: Medicare (Managed Care) | Attending: Emergency Medicine | Admitting: Emergency Medicine

## 2015-11-17 ENCOUNTER — Emergency Department (HOSPITAL_COMMUNITY): Payer: Medicare (Managed Care)

## 2015-11-17 DIAGNOSIS — M25511 Pain in right shoulder: Secondary | ICD-10-CM | POA: Insufficient documentation

## 2015-11-17 DIAGNOSIS — Z79899 Other long term (current) drug therapy: Secondary | ICD-10-CM | POA: Insufficient documentation

## 2015-11-17 DIAGNOSIS — M2550 Pain in unspecified joint: Secondary | ICD-10-CM

## 2015-11-17 DIAGNOSIS — M549 Dorsalgia, unspecified: Secondary | ICD-10-CM | POA: Diagnosis not present

## 2015-11-17 DIAGNOSIS — M25512 Pain in left shoulder: Secondary | ICD-10-CM | POA: Insufficient documentation

## 2015-11-17 DIAGNOSIS — J45909 Unspecified asthma, uncomplicated: Secondary | ICD-10-CM | POA: Insufficient documentation

## 2015-11-17 LAB — BASIC METABOLIC PANEL
ANION GAP: 7 (ref 5–15)
BUN: 9 mg/dL (ref 6–20)
CALCIUM: 9.3 mg/dL (ref 8.9–10.3)
CO2: 28 mmol/L (ref 22–32)
CREATININE: 1.14 mg/dL — AB (ref 0.44–1.00)
Chloride: 102 mmol/L (ref 101–111)
GFR, EST AFRICAN AMERICAN: 54 mL/min — AB (ref >60)
GFR, EST NON AFRICAN AMERICAN: 47 mL/min — AB (ref >60)
GLUCOSE: 95 mg/dL (ref 65–99)
Potassium: 4 mmol/L (ref 3.5–5.1)
Sodium: 137 mmol/L (ref 135–145)

## 2015-11-17 LAB — CBC WITH DIFFERENTIAL/PLATELET
BASOS ABS: 0 10*3/uL (ref 0.0–0.1)
BASOS PCT: 0 %
EOS PCT: 2 %
Eosinophils Absolute: 0.1 10*3/uL (ref 0.0–0.7)
HEMATOCRIT: 44.9 % (ref 36.0–46.0)
Hemoglobin: 14.7 g/dL (ref 12.0–15.0)
Lymphocytes Relative: 20 %
Lymphs Abs: 1.4 10*3/uL (ref 0.7–4.0)
MCH: 30.1 pg (ref 26.0–34.0)
MCHC: 32.7 g/dL (ref 30.0–36.0)
MCV: 91.8 fL (ref 78.0–100.0)
MONO ABS: 0.6 10*3/uL (ref 0.1–1.0)
MONOS PCT: 9 %
NEUTROS ABS: 4.9 10*3/uL (ref 1.7–7.7)
Neutrophils Relative %: 69 %
PLATELETS: 246 10*3/uL (ref 150–400)
RBC: 4.89 MIL/uL (ref 3.87–5.11)
RDW: 12.7 % (ref 11.5–15.5)
WBC: 7.1 10*3/uL (ref 4.0–10.5)

## 2015-11-17 LAB — TROPONIN I: Troponin I: <0.03 ng/mL (ref <0.03)

## 2015-11-17 MED ORDER — PREDNISONE 50 MG PO TABS: ORAL_TABLET | ORAL | 0 refills | Status: DC

## 2015-11-17 MED ORDER — HYDROCODONE-ACETAMINOPHEN 5-325 MG PO TABS
1.0000 | ORAL_TABLET | Freq: Once | ORAL | Status: AC
  Administered 2015-11-17: 1 via ORAL
  Filled 2015-11-17: qty 1

## 2015-11-17 NOTE — ED Provider Notes (Signed)
[facility] DEPT Provider Note   CSN: [id number] Arrival date & time: 11/17/15  1322  First Provider Contact:  First MD Initiated Contact with Patient 11/17/15 1542        History   Chief Complaint Chief Complaint  Patient presents with  . Back Pain  . Shoulder Pain    HPI [name] is a 75 y.o. female.  Patient presents with acute on chronic pain to her bilateral shoulders, neck and back. States she's had this pain for 6-8 months but is gradually getting worse over the past several weeks. Denies any recent falls, last fall was a year ago. Denies any chest pain or shortness of breath. Has seen her PCP for this pain 2 days ago and was told it was due to "bone spurs". She was given tramadol which she is taking with minimal relief. She's eating a fast food meal in the room in no distress. She denies any fevers, vomiting, focal weakness, numbness or tingling. She is scheduled for left knee replacement by Dr. Maxie Better in the near future. She denies any blood thinner use. She denies any history of cardiac problems with this have asthma. She denies any bowel or bladder incontinence. Denies any fever or vomiting. States she sometimes has to rush to get to the bathroom but knows it is coming.   The history is provided by the patient.  Back Pain   Pertinent negatives include no fever, no headaches, no abdominal pain, no dysuria and no weakness.  Shoulder Pain      Past Medical History:  Diagnosis Date  . Anemia 1990  . Anxiety   . Arthritis 2004  . Asthma 1995  . Colon polyp   . Diverticulosis 2003  . GERD (gastroesophageal reflux disease)     Patient Active Problem List   Diagnosis Date Noted  . Asthma 12/07/2011  . Arthritis 12/07/2011  . 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 Obesity  12/07/2011  . DIVERTICULOSIS-COLON 12/01/2008  . ABDOMINAL PAIN-RLQ 12/01/2008    Past Surgical History:  Procedure Laterality Date  . APPENDECTOMY  1966  .  CATARACT EXTRACTION     Bilateral  . CHOLECYSTECTOMY  2004  . COLON SURGERY  2008  . COLOSTOMY    . COLOSTOMY TAKEDOWN    . HEEL SPUR SURGERY     Right, Metal Plate in Heel  . [place]  2010  . NEPHRECTOMY     Left  . TONSILLECTOMY      OB History    No data available       Home Medications    Prior to Admission medications   Medication Sig Start Date End Date Taking? Authorizing Provider  albuterol (PROVENTIL HFA;VENTOLIN HFA) 108 (90 Base) MCG/ACT inhaler Inhale into the lungs every 6 (six) hours as needed for wheezing or shortness of breath.    Historical Provider, MD  cetirizine (ZYRTEC) 10 MG tablet Take 1 tablet (10 mg total) by mouth daily. 06/11/15   [name], PA-C  LORazepam (ATIVAN) 2 MG tablet Take 2 mg by mouth at bedtime.    Historical Provider, MD  mometasone-formoterol (DULERA) 100-5 MCG/ACT AERO Inhale 2 puffs into the lungs 2 (two) times daily.    Historical Provider, MD  Omeprazole (PRILOSEC PO) Take 1 tablet by mouth as needed.    Historical Provider, MD  predniSONE (DELTASONE) 20 MG tablet Take 2 tablets daily with breakfast. Patient not taking: Reported on 10/31/2015 10/20/15   [name], PA-C  risperiDONE (RISPERDAL) 2 MG tablet Reported on 10/20/2015 11/14/11   Historical Provider, MD  triamcinolone cream (KENALOG) 0.5 % Apply 1 application topically 3 (three) times daily. 11/07/15   Leonie Douglas, PA-C    Family History Family History  Problem Relation Age of Onset  . Colon polyps Other     niece  . Diabetes Paternal Grandmother   . Diabetes Paternal Aunt   . Heart failure Mother   . Heart attack Father     Social History Social History  Substance Use Topics  . Smoking status: Never Smoker  . Smokeless tobacco: Never Used  . Alcohol use No     Allergies   Haloperidol and Aripiprazole   Review of Systems Review of Systems  Constitutional: Negative for activity change, appetite change and fever.  HENT: Negative for  congestion and rhinorrhea.   Eyes: Negative for visual disturbance.  Respiratory: Negative for cough, chest tightness and shortness of breath.   Gastrointestinal: Negative for abdominal pain, nausea and vomiting.  Genitourinary: Negative for dysuria, hematuria, vaginal bleeding and vaginal discharge.  Musculoskeletal: Positive for arthralgias, back pain and myalgias.  Neurological: Negative for dizziness, weakness, light-headedness and headaches.  Hematological: Negative for adenopathy.  A complete 10 system review of systems was obtained and all systems are negative except as noted in the HPI and PMH.     Physical Exam Updated Vital Signs BP 169/83   Pulse 78   Temp 99 F (37.2 C) (Oral)   Resp 18   SpO2 97%   Physical Exam  Constitutional: She is oriented to person, place, and time. She appears well-developed and well-nourished. No distress.  HENT:  Head: Normocephalic and atraumatic.  Mouth/Throat: Oropharynx is clear and moist. No oropharyngeal exudate.  Eyes: Conjunctivae and EOM are normal. Pupils are equal, round, and reactive to light.  Neck: Normal range of motion. Neck supple.  No meningismus.  Cardiovascular: Normal rate, regular rhythm, normal heart sounds and intact distal pulses.   No murmur heard. Pulmonary/Chest: Effort normal and breath sounds normal. No respiratory distress.  Abdominal: Soft. There is no tenderness. There is no rebound and no guarding.  Musculoskeletal: Normal range of motion. She exhibits no edema.  Diffuse C and T spine tenderness, no L spine tenderness No stepoff or deformity FROm bilateral shoulders Equal grip strengths and radial pulses. Equal DP and PT pulses FROM bilateral shoulders and knees  Neurological: She is alert and oriented to person, place, and time. No cranial nerve deficit. She exhibits normal muscle tone. Coordination normal.  No ataxia on finger to nose bilaterally. No pronator drift. 5/5 strength throughout. CN 2-12  intact.Equal grip strength. Sensation intact.   Skin: Skin is warm.  Psychiatric: She has a normal mood and affect. Her behavior is normal.  Nursing note and vitals reviewed.    ED Treatments / Results  Labs (all labs ordered are listed, but only abnormal results are displayed) Labs Reviewed  BASIC METABOLIC PANEL - Abnormal; Notable for the following:       Result Value   Creatinine, Ser 1.14 (*)    GFR calc non Af Amer 47 (*)    GFR calc Af Amer 54 (*)    All other components within normal limits  CBC WITH DIFFERENTIAL/PLATELET  TROPONIN I    EKG  EKG Interpretation  Date/Time:  Thursday November 17 2015 17:57:56 EDT Ventricular Rate:  69 PR Interval:    QRS Duration: 90 QT Interval:  390 QTC Calculation: 418 R Axis:  43 Text Interpretation:  Sinus rhythm Low voltage, precordial leads Consider anterior infarct Baseline wander Confirmed by Wyvonnia Dusky  MD, Alyvia Derk [phone]) on 11/17/2015 6:02:37 PM       Radiology Dg Cervical Spine Complete  Result Date: 11/17/2015 CLINICAL DATA:  Neck pain for 1 year, history of remote injury 1 year ago, initial encounter EXAM: CERVICAL SPINE - COMPLETE 4+ VIEW COMPARISON:  None. FINDINGS: Seven cervical segments are well visualized. Vertebral body height is well maintained. Osteophytic changes are noted from C3-C7. Mild facet hypertrophic changes are noted. Neural foraminal narrowing is seen at C5-6 and C6-7 bilaterally. No soft tissue abnormality is seen. The odontoid is within normal limits. IMPRESSION: Degenerative change without acute abnormality. Electronically Signed   By: Inez Catalina M.D.   On: 11/17/2015 17:12   Dg Thoracic Spine 2 View  Result Date: 11/17/2015 CLINICAL DATA:  Upper chest pain for 1 year, history of remote injury 1 year ago, initial encounter EXAM: THORACIC SPINE 2 VIEWS COMPARISON:  None. FINDINGS: Mild osteophytic changes are seen. Vertebral body height is well maintained. No paraspinal mass lesion or pedicular  abnormality is noted. IMPRESSION: Degenerative change without acute abnormality. Electronically Signed   By: Inez Catalina M.D.   On: 11/17/2015 17:13   Dg Lumbar Spine Complete  Result Date: 11/17/2015 CLINICAL DATA:  Low back pain, history of remote fall 1 year ago, initial encounter EXAM: LUMBAR SPINE - COMPLETE 4+ VIEW COMPARISON:  None. FINDINGS: Five lumbar type vertebral bodies are well visualized. Vertebral body height is well maintained. Facet hypertrophic changes are seen. Very mild anterolisthesis of L4 with respect L5 is noted. This is felt to be of a degenerative nature. Aortic calcifications are seen without aneurysmal dilatation. IMPRESSION: Degenerative change without acute abnormality. Electronically Signed   By: Inez Catalina M.D.   On: 11/17/2015 17:13   Dg Shoulder Right  Result Date: 11/17/2015 CLINICAL DATA:  Right shoulder pain, history of remote injury 1 year ago, initial encounter EXAM: RIGHT SHOULDER - 2+ VIEW COMPARISON:  None. FINDINGS: Degenerative changes of the acromioclavicular joint are seen. No acute fracture or dislocation is noted. No gross soft tissue abnormality is seen. IMPRESSION: Mild degenerative change without acute abnormality. Electronically Signed   By: Inez Catalina M.D.   On: 11/17/2015 17:15   Dg Shoulder Left  Result Date: 11/17/2015 CLINICAL DATA:  Left shoulder pain for 1 year, history of remote injury 1 year ago, initial encounter EXAM: LEFT SHOULDER - 2+ VIEW COMPARISON:  None. FINDINGS: Mild degenerative changes of the acromioclavicular joint are seen. No acute fracture or dislocation is noted. No soft tissue abnormality is seen. IMPRESSION: No acute abnormality noted. Electronically Signed   By: Inez Catalina M.D.   On: 11/17/2015 17:14    Procedures Procedures (including critical care time)  Medications Ordered in ED Medications  HYDROcodone-acetaminophen (NORCO/VICODIN) 5-325 MG per tablet 1 tablet (not administered)     Initial Impression  / Assessment and Plan / ED Course  I have reviewed the triage vital signs and the nursing notes.  Pertinent labs & imaging results that were available during my care of the patient were reviewed by me and considered in my medical decision making (see chart for details).  Clinical Course   Acute on chronic neck, back, shoulder pain without acute injury. Patient appears well and is neurologically intact with intact distal pulses. No chest pain or shortness of breath.  Labs are at baseline. Troponin negative. EKG unchanged. Exam without acute neurological deficits.  X-rays of painful areas showed degenerative changes without acute pathology. We'll treat conservatively for likely arthritic pain. Follow-up with her PCP and orthopedics. Return precautions discussed.  Final Clinical Impressions(s) / ED Diagnoses   Final diagnoses:  Arthralgia    New Prescriptions New Prescriptions   No medications on file     Ezequiel Essex, MD 11/17/15 1919

## 2015-11-17 NOTE — ED Notes (Signed)
Pt back from XR 

## 2015-11-17 NOTE — Discharge Instructions (Signed)
Take the medication as prescribed and follow up with your doctor. Return to the ED if you develop new or worsening symptoms.

## 2015-11-17 NOTE — ED Notes (Signed)
Pt to XR at this time via stretcher.

## 2015-11-17 NOTE — ED Triage Notes (Signed)
Patient here with chronic shoulder pain, back pain and neck pain. States that the pain is worse x 2 days, taking tramadol with minimal relief

## 2015-11-20 ENCOUNTER — Other Ambulatory Visit (HOSPITAL_COMMUNITY): Payer: Self-pay | Admitting: Psychiatry

## 2016-03-07 ENCOUNTER — Ambulatory Visit: Payer: Self-pay | Admitting: Orthopedic Surgery

## 2016-03-12 ENCOUNTER — Ambulatory Visit: Payer: Self-pay | Admitting: Orthopedic Surgery

## 2016-03-13 ENCOUNTER — Ambulatory Visit: Payer: Self-pay | Admitting: Orthopedic Surgery

## 2016-03-13 NOTE — H&P (Signed)
[name] DOB: Nov 03, 1940 Separated / Language: [name] / Race: White Female  H&P Date: 03/13/16  Chief Complaint: Left knee pain  History of Present Illness The patient is a 75 year old female who comes in today for a preoperative History and Physical. The patient is scheduled for a left total knee arthroplasty to be performed by Dr. Johnn Hai, MD at [facility] on 04/04/16. [name] reports chronic pain L knee refractory to conservative tx including injections with steroid, viscosupplementation, bracing, activity modifcations, relative rest, quad strengthening, and medications. Pain is interfering with her ADLs and quality of life at this point and she desires to proceed with surgery.  Dr. Tonita Cong and the patient mutually agreed to proceed with a left total knee replacement. Risks and benefits of the procedure were discussed including stiffness, suboptimal range of motion, persistent pain, infection requiring removal of prosthesis and reinsertion, need for prophylactic antibiotics in the future, for example, dental procedures, possible need for manipulation, revision in the future and also anesthetic complications including DVT, PE, etc. We discussed the perioperative course, time in the hospital, postoperative recovery and the need for elevation to control swelling. We also discussed the predicted range of motion and the probability that squatting and kneeling would be unobtainable in the future. In addition, postoperative anticoagulation was discussed. We have obtained preoperative medical clearance as necessary. Provided her illustrated handout and discussed it in detail. They will enroll in the total joint replacement educational forum at the hospital.  Problem List/Past Medical Hx Thoracic DDD (M51.34)  Thoracic spine pain (M54.6) Anxiety Disorder  Asthma  Diverticulitis Of Colon  Gastroesophageal Reflux Disease  Osteoarthritis  Diverticulosis  and colon  polyps Anemia  prior hx of blood transfusion post-op anemia Cataract  s/p surgery   Allergies  Haldol *ANTIPSYCHOTICS/ANTIMANIC AGENTS*  Anaphylaxis. ARIPiprazole *ANTIPSYCHOTICS/ANTIMANIC AGENTS*  Allergies Reconciled   Family History Cancer  Maternal Grandfather. Cerebrovascular Accident  Mother. Chronic Obstructive Lung Disease  Sister. Congestive Heart Failure  Mother. Drug / Alcohol Addiction  Father. First Degree Relatives  reported Heart disease in female family member before age 5  Hypertension  Mother. Rheumatoid Arthritis  Sister. Father  MI Diabetes Mellitus  Paternal Grandmother, Paternal Aunt. Colon Polyps  niece  Social History Tobacco use  Never smoker. 08/25/2014 Children  5 or more Current drinker  08/25/2014: Currently drinks wine only occasionally per week Current work status  retired Exercise  Exercises rarely; does other and team sport Living situation  live alone- 4 steps to enter one level home Marital status  married No history of drug/alcohol rehab  Not under pain contract  Number of flights of stairs before winded  greater than 5 Tobacco / smoke exposure  08/25/2014: no Post-Surgical Plans  short term rehab stay then home with HHPT/outpt PT depending on length of rehab stay  Medication History  LORazepam (2MG  Tablet, Oral) Active. PriLOSEC OTC (20MG  Tablet DR, Oral) Active. (prn) Perphenazine (2MG  Tablet, 6mg  Oral qhs) Active. TraMADol HCl (50MG  Tablet, Oral) Active. (prn) Acetaminophen (Oral) Specific strength unknown - Active. (prn) Albuterol (Inhalation) Specific strength unknown - Active. (prn) Medications Reconciled  Past Surgical History  Appendectomy  Cataract Surgery  bilateral Foot Surgery  right- heel spur Gallbladder Surgery  laporoscopic Tonsillectomy  Nephrectomy  Exploratory Laparotomy  due to endometriosis Hernia Repair  incisional Cholecystectomy  Colon Removal - Partial   Laparoscopic. 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 Skin Not Present- Eczema, Hives, Itching, Lesions and Rash. HEENT Not Present- Dentures, Double Vision, Headache, Hearing Loss, Tinnitus and Visual Loss. Respiratory Not Present- Allergies, Chronic Cough, Coughing up blood, Shortness of breath at rest and Shortness of breath with exertion. Cardiovascular Not Present- Chest Pain, Difficulty Breathing Lying Down, Murmur, Palpitations, Racing/skipping heartbeats and Swelling. Gastrointestinal Present- Heartburn. Not Present- Abdominal Pain, Bloody Stool, Constipation, Diarrhea, Difficulty Swallowing, Jaundice, Loss of appetitie, Nausea and Vomiting. Female Genitourinary Not Present- Blood in Urine, Discharge, Flank Pain, Incontinence, Painful Urination, Urgency, Urinary frequency, Urinary Retention, Urinating at Night and Weak urinary stream. Musculoskeletal Present- Joint Pain, Joint Swelling, Morning Stiffness and Muscle Pain. Not Present- Back Pain, Muscle Weakness and Spasms. Neurological Not Present- Blackout spells, Difficulty with balance, Dizziness, Paralysis, Tremor and Weakness. Psychiatric Not Present- Insomnia.  Physical Exam General Mental Status -Alert, cooperative and good historian. General Appearance-pleasant, Not in acute distress. Orientation-Oriented X3. Build & Nutrition-Well nourished and Well developed.  Head and Neck Head-normocephalic, atraumatic . Neck Global Assessment - supple, no bruit auscultated on the right, no bruit auscultated on the left.  Eye Pupil - Bilateral-Regular and Round. Motion - Bilateral-EOMI.  Chest and Lung Exam Auscultation Breath sounds - clear at anterior chest wall and clear at posterior chest wall. Adventitious sounds - No Adventitious  sounds.  Cardiovascular Auscultation Rhythm - Regular rate and rhythm. Heart Sounds - S1 WNL and S2 WNL. Murmurs & Other Heart Sounds - Auscultation of the heart reveals - No Murmurs.  Abdomen Palpation/Percussion Tenderness - Abdomen is non-tender to palpation. Rigidity (guarding) - Abdomen is soft. Auscultation Auscultation of the abdomen reveals - Bowel sounds normal.  Female Genitourinary Not done, not pertinent to present illness   Musculoskeletal On exam, she is obese. She has an antalgic gait. Left knee, trace effusion. She is tender to palpation medial and lateral joint lines, nontender patella, patellar tendon, quad tendon, fibular head, peroneal nerve, popliteal space. No calf pain or sign of DVT. No pain or laxity with varus or valgus stress. No instability. Range of motion approximately 0 to 110.  Imaging New x-rays ordered, obtained, reviewed today. No fracture, subluxation, dislocation, lytic or blastic lesions. Severe medial joint space narrowing, left knee; moderately severe, right knee. Left knee bone on bone medial joint space with tricompartmental arthrosis, bone spurring noted.  Assessment & Plan Primary osteoarthritis of left knee (M17.12)  Pt with end-stage L knee DJD, bone-on-bone, refractory to conservative tx, scheduled for L total knee replacement by Dr. Tonita Cong on 04/04/16. We again discussed the procedure itself as well as risks, complications and alternatives, including but not limited to DVT, PE, infx, bleeding, failure of procedure, need for secondary procedure including manipulation, nerve injury, ongoing pain/symptoms, anesthesia risk, even stroke or death. Also discussed typical post-op protocols, activity restrictions, need for PT, flexion/extension exercises, time out of work. Discussed need for DVT ppx post-op with Xarelto then ASA per protocol. Discussed dental ppx. Also discussed limitations post-operatively such as kneeling and squatting. All questions  were answered. Patient desires to proceed with surgery as scheduled. Will hold supplements, ASA and NSAIDs accordingly. Will remain NPO after MN night before surgery. Will present to [facility] for pre-op testing. Plan ASA for DVT ppx. Plan pain medication, Robaxin, Colace. Plan home with HHPT post-op with family members at home for assistance. Will follow up 10-14 days post-op for suture removal and xrays.  Left total knee replacement  Signed electronically by Ellison Hughs  Deatra Robinson, PA-C for Dr. Tonita Cong

## 2016-03-27 NOTE — Patient Instructions (Addendum)
[name]  03/27/2016   Your procedure is scheduled on: Wednesday 04/04/2016  Report to [name] Main  Entrance take [facility]  elevators to 3rd floor to  [name] at  [place] AM.  Call this number if you have problems the morning of surgery [phone]   Remember: ONLY 1 PERSON MAY GO WITH YOU TO SHORT STAY TO GET  READY MORNING OF [name].    Do not eat food or drink liquids :After Midnight.     Take these medicines the morning of surgery with A SIP OF WATER: Use Albuterol inhaler if needed, Zyrtec, Tramadol if needed                                You may not have any metal on your body including hair pins and              piercings  Do not wear jewelry, make-up, lotions, powders or perfumes, deodorant             Do not wear nail polish.  Do not shave  48 hours prior to surgery.              Men may shave face and neck.   Do not bring valuables to the hospital. [place].  Contacts, dentures or bridgework may not be worn into surgery.  Leave suitcase in the car. After surgery it may be brought to your room.                 Please read over the following fact sheets you were given: _____________________________________________________________________             [name] - Preparing for Surgery Before surgery, you can play an important role.  Because skin is not sterile, your skin needs to be as free of germs as possible.  You can reduce the number of germs on your skin by washing with CHG (chlorahexidine gluconate) soap before surgery.  CHG is an antiseptic cleaner which kills germs and bonds with the skin to continue killing germs even after washing. Please DO NOT use if you have an allergy to CHG or antibacterial soaps.  If your skin becomes reddened/irritated stop using the CHG and inform your nurse when you arrive at Short Stay. Do not shave (including legs and underarms) for at  least 48 hours prior to the first CHG shower.  You may shave your face/neck. Please follow these instructions carefully:  1.  Shower with CHG Soap the night before surgery and the  morning of Surgery.  2.  If you choose to wash your hair, wash your hair first as usual with your  normal  shampoo.  3.  After you shampoo, rinse your hair and body thoroughly to remove the  shampoo.                           4.  Use CHG as you would any other liquid soap.  You can apply chg directly  to the skin and wash                       Gently with a  scrungie or clean washcloth.  5.  Apply the CHG Soap to your body ONLY FROM THE NECK DOWN.   Do not use on face/ open                           Wound or open sores. Avoid contact with eyes, ears mouth and genitals (private parts).                       Wash face,  Genitals (private parts) with your normal soap.             6.  Wash thoroughly, paying special attention to the area where your surgery  will be performed.  7.  Thoroughly rinse your body with warm water from the neck down.  8.  DO NOT shower/wash with your normal soap after using and rinsing off  the CHG Soap.                9.  Pat yourself dry with a clean towel.            10.  Wear clean pajamas.            11.  Place clean sheets on your bed the night of your first shower and do not  sleep with pets. Day of Surgery : Do not apply any lotions/deodorants the morning of surgery.  Please wear clean clothes to the hospital/surgery center.  FAILURE TO FOLLOW THESE INSTRUCTIONS MAY RESULT IN THE CANCELLATION OF YOUR SURGERY PATIENT SIGNATURE_________________________________  NURSE SIGNATURE__________________________________  ________________________________________________________________________   [name]  An incentive spirometer is a tool that can help keep your lungs clear and active. This tool measures how well you are filling your lungs with each breath. Taking long deep breaths  may help reverse or decrease the chance of developing breathing (pulmonary) problems (especially infection) following:  A long period of time when you are unable to move or be active. BEFORE THE PROCEDURE   If the spirometer includes an indicator to show your best effort, your nurse or respiratory therapist will set it to a desired goal.  If possible, sit up straight or lean slightly forward. Try not to slouch.  Hold the incentive spirometer in an upright position. INSTRUCTIONS FOR USE  1. Sit on the edge of your bed if possible, or sit up as far as you can in bed or on a chair. 2. Hold the incentive spirometer in an upright position. 3. Breathe out normally. 4. Place the mouthpiece in your mouth and seal your lips tightly around it. 5. Breathe in slowly and as deeply as possible, raising the piston or the ball toward the top of the column. 6. Hold your breath for 3-5 seconds or for as long as possible. Allow the piston or ball to fall to the bottom of the column. 7. Remove the mouthpiece from your mouth and breathe out normally. 8. Rest for a few seconds and repeat Steps 1 through 7 at least 10 times every 1-2 hours when you are awake. Take your time and take a few normal breaths between deep breaths. 9. The spirometer may include an indicator to show your best effort. Use the indicator as a goal to work toward during each repetition. 10. After each set of 10 deep breaths, practice coughing to be sure your lungs are clear. If you have an incision (the cut made at the time of surgery), support your incision  when coughing by placing a pillow or rolled up towels firmly against it. Once you are able to get out of bed, walk around indoors and cough well. You may stop using the incentive spirometer when instructed by your caregiver.  RISKS AND COMPLICATIONS  Take your time so you do not get dizzy or light-headed.  If you are in pain, you may need to take or ask for pain medication before doing  incentive spirometry. It is harder to take a deep breath if you are having pain. AFTER USE  Rest and breathe slowly and easily.  It can be helpful to keep track of a log of your progress. Your caregiver can provide you with a simple table to help with this. If you are using the spirometer at home, follow these instructions: [place] IF:   You are having difficultly using the spirometer.  You have trouble using the spirometer as often as instructed.  Your pain medication is not giving enough relief while using the spirometer.  You develop fever of 100.5 F (38.1 C) or higher. SEEK IMMEDIATE MEDICAL CARE IF:   You cough up bloody sputum that had not been present before.  You develop fever of 102 F (38.9 C) or greater.  You develop worsening pain at or near the incision site. MAKE SURE YOU:   Understand these instructions.  Will watch your condition.  Will get help right away if you are not doing well or get worse. Document Released: 08/06/2006 Document Revised: 06/18/2011 Document Reviewed: 10/07/2006 Exit[employer] Patient Information 2014 Exit[employer], [place].   ________________________________________________________________________  WHAT IS A BLOOD TRANSFUSION? Blood Transfusion Information  A transfusion is the replacement of blood or some of its parts. Blood is made up of multiple cells which provide different functions.  Red blood cells carry oxygen and are used for blood loss replacement.  White blood cells fight against infection.  Platelets control bleeding.  Plasma helps clot blood.  Other blood products are available for specialized needs, such as hemophilia or other clotting disorders. BEFORE THE TRANSFUSION  Who gives blood for transfusions?   Healthy volunteers who are fully evaluated to make sure their blood is safe. This is blood bank blood. Transfusion therapy is the safest it has ever been in the practice of medicine. Before blood is taken from a  donor, a complete history is taken to make sure that person has no history of diseases nor engages in risky social behavior (examples are intravenous drug use or sexual activity with multiple partners). The donor's travel history is screened to minimize risk of transmitting infections, such as malaria. The donated blood is tested for signs of infectious diseases, such as HIV and hepatitis. The blood is then tested to be sure it is compatible with you in order to minimize the chance of a transfusion reaction. If you or a relative donates blood, this is often done in anticipation of surgery and is not appropriate for emergency situations. It takes many days to process the donated blood. RISKS AND COMPLICATIONS Although transfusion therapy is very safe and saves many lives, the main dangers of transfusion include:   Getting an infectious disease.  Developing a transfusion reaction. This is an allergic reaction to something in the blood you were given. Every precaution is taken to prevent this. The decision to have a blood transfusion has been considered carefully by your caregiver before blood is given. Blood is not given unless the benefits outweigh the risks. AFTER THE TRANSFUSION  Right after  receiving a blood transfusion, you will usually feel much better and more energetic. This is especially true if your red blood cells have gotten low (anemic). The transfusion raises the level of the red blood cells which carry oxygen, and this usually causes an energy increase.  The nurse administering the transfusion will monitor you carefully for complications. HOME CARE INSTRUCTIONS  No special instructions are needed after a transfusion. You may find your energy is better. Speak with your caregiver about any limitations on activity for underlying diseases you may have. SEEK MEDICAL CARE IF:   Your condition is not improving after your transfusion.  You develop redness or irritation at the intravenous (IV)  site. SEEK IMMEDIATE MEDICAL CARE IF:  Any of the following symptoms occur over the next 12 hours:  Shaking chills.  You have a temperature by mouth above 102 F (38.9 C), not controlled by medicine.  Chest, back, or muscle pain.  People around you feel you are not acting correctly or are confused.  Shortness of breath or difficulty breathing.  Dizziness and fainting.  You get a rash or develop hives.  You have a decrease in urine output.  Your urine turns a dark color or changes to pink, red, or brown. Any of the following symptoms occur over the next 10 days:  You have a temperature by mouth above 102 F (38.9 C), not controlled by medicine.  Shortness of breath.  Weakness after normal activity.  The white part of the eye turns yellow (jaundice).  You have a decrease in the amount of urine or are urinating less often.  Your urine turns a dark color or changes to pink, red, or brown. Document Released: 03/23/2000 Document Revised: 06/18/2011 Document Reviewed: 11/10/2007 [facility] Patient Information 2014 [place], [place].  _______________________________________________________________________

## 2016-03-27 NOTE — Progress Notes (Signed)
01/18/2016- Office visit from Dr. Clista Bernhardt and EKG on chart. 02/09/2016-Echo report from [facility] in chart. 03/23/2016-Pre-operative clearance note from Dr. Clista Bernhardt on chart.

## 2016-03-28 ENCOUNTER — Encounter (HOSPITAL_COMMUNITY): Payer: Self-pay

## 2016-03-28 ENCOUNTER — Encounter (HOSPITAL_COMMUNITY)
Admission: RE | Admit: 2016-03-28 | Discharge: 2016-03-28 | Disposition: A | Discharge status: Home / Self Care | Payer: Medicare (Managed Care) | Source: Ambulatory Visit | Attending: Specialist | Admitting: Specialist

## 2016-03-28 DIAGNOSIS — M1712 Unilateral primary osteoarthritis, left knee: Secondary | ICD-10-CM | POA: Diagnosis not present

## 2016-03-28 DIAGNOSIS — Z01812 Encounter for preprocedural laboratory examination: Secondary | ICD-10-CM | POA: Insufficient documentation

## 2016-03-28 HISTORY — DX: Adverse effect of unspecified anesthetic, initial encounter: T41.45XA

## 2016-03-28 LAB — BASIC METABOLIC PANEL
ANION GAP: 8 (ref 5–15)
BUN: 13 mg/dL (ref 6–20)
CALCIUM: 9 mg/dL (ref 8.9–10.3)
CO2: 27 mmol/L (ref 22–32)
Chloride: 102 mmol/L (ref 101–111)
Creatinine, Ser: 1 mg/dL (ref 0.44–1.00)
GFR calc Af Amer: >60 mL/min (ref >60)
GFR, EST NON AFRICAN AMERICAN: 54 mL/min — AB (ref >60)
GLUCOSE: 93 mg/dL (ref 65–99)
POTASSIUM: 4 mmol/L (ref 3.5–5.1)
Sodium: 137 mmol/L (ref 135–145)

## 2016-03-28 LAB — URINALYSIS, ROUTINE W REFLEX MICROSCOPIC
Bilirubin Urine: NEGATIVE
GLUCOSE, UA: NEGATIVE mg/dL
HGB URINE DIPSTICK: NEGATIVE
Ketones, ur: NEGATIVE mg/dL
NITRITE: NEGATIVE
PH: 6 (ref 5.0–8.0)
PROTEIN: NEGATIVE mg/dL
SPECIFIC GRAVITY, URINE: 1.014 (ref 1.005–1.030)

## 2016-03-28 LAB — CBC
HEMATOCRIT: 43.7 % (ref 36.0–46.0)
HEMOGLOBIN: 15 g/dL (ref 12.0–15.0)
MCH: 30.3 pg (ref 26.0–34.0)
MCHC: 34.3 g/dL (ref 30.0–36.0)
MCV: 88.3 fL (ref 78.0–100.0)
Platelets: 252 10*3/uL (ref 150–400)
RBC: 4.95 MIL/uL (ref 3.87–5.11)
RDW: 12.7 % (ref 11.5–15.5)
WBC: 8.9 10*3/uL (ref 4.0–10.5)

## 2016-03-28 LAB — PROTIME-INR
INR: 0.92
Prothrombin Time: 12.4 seconds (ref 11.4–15.2)

## 2016-03-28 LAB — SURGICAL PCR SCREEN
MRSA, PCR: NEGATIVE
STAPHYLOCOCCUS AUREUS: NEGATIVE

## 2016-03-28 LAB — APTT: APTT: 30 s (ref 24–36)

## 2016-04-03 NOTE — Anesthesia Preprocedure Evaluation (Addendum)
Anesthesia Evaluation    Reviewed: Allergy & Precautions, H&P , Patient's Chart, lab work & pertinent test results  Airway Mallampati: III  TM Distance: >3 FB Neck ROM: Full    Dental  (+) Teeth Intact, Dental Advisory Given   Pulmonary asthma ,    breath sounds clear to auscultation       Cardiovascular Exercise Tolerance: Good negative cardio ROS   Rhythm:Regular Rate:Normal     Neuro/Psych Anxiety negative neurological ROS  negative psych ROS   GI/Hepatic Neg liver ROS, GERD  Medicated and Controlled,  Endo/Other  negative endocrine ROS  Renal/GU negative Renal ROS  negative genitourinary   Musculoskeletal  (+) Arthritis , Osteoarthritis,    Abdominal   Peds  Hematology negative hematology ROS (+)   Anesthesia Other Findings   Reproductive/Obstetrics negative OB ROS                            Anesthesia Physical Anesthesia Plan  ASA: II  Anesthesia Plan: Spinal   Post-op Pain Management:  Regional for Post-op pain   Induction: Intravenous  Airway Management Planned: Simple Face Mask  Additional Equipment:   Intra-op Plan:   Post-operative Plan:   Informed Consent: I have reviewed the patients History and Physical, chart, labs and discussed the procedure including the risks, benefits and alternatives for the proposed anesthesia with the patient or authorized representative who has indicated his/her understanding and acceptance.   Dental advisory given  Plan Discussed with: CRNA  Anesthesia Plan Comments:        Anesthesia Quick Evaluation

## 2016-04-04 ENCOUNTER — Inpatient Hospital Stay (HOSPITAL_COMMUNITY)
Admission: RE | Admit: 2016-04-04 | Discharge: 2016-04-06 | DRG: 470 | Disposition: A | Discharge status: Skilled Nursing Facility | Payer: Medicare (Managed Care) | Source: Ambulatory Visit | Attending: Specialist | Admitting: Specialist

## 2016-04-04 ENCOUNTER — Inpatient Hospital Stay (HOSPITAL_COMMUNITY): Payer: Medicare (Managed Care) | Admitting: Anesthesiology

## 2016-04-04 ENCOUNTER — Encounter (HOSPITAL_COMMUNITY): Payer: Self-pay

## 2016-04-04 ENCOUNTER — Inpatient Hospital Stay (HOSPITAL_COMMUNITY): Payer: Medicare (Managed Care)

## 2016-04-04 ENCOUNTER — Encounter (HOSPITAL_COMMUNITY)
Admission: RE | Disposition: A | Discharge status: Skilled Nursing Facility | Payer: Self-pay | Source: Ambulatory Visit | Attending: Specialist

## 2016-04-04 DIAGNOSIS — F419 Anxiety disorder, unspecified: Secondary | ICD-10-CM | POA: Diagnosis present

## 2016-04-04 DIAGNOSIS — G8929 Other chronic pain: Secondary | ICD-10-CM | POA: Diagnosis present

## 2016-04-04 DIAGNOSIS — J45909 Unspecified asthma, uncomplicated: Secondary | ICD-10-CM | POA: Diagnosis present

## 2016-04-04 DIAGNOSIS — Z86010 Personal history of colonic polyps: Secondary | ICD-10-CM | POA: Diagnosis not present

## 2016-04-04 DIAGNOSIS — K219 Gastro-esophageal reflux disease without esophagitis: Secondary | ICD-10-CM | POA: Diagnosis present

## 2016-04-04 DIAGNOSIS — M5134 Other intervertebral disc degeneration, thoracic region: Secondary | ICD-10-CM | POA: Diagnosis present

## 2016-04-04 DIAGNOSIS — M1712 Unilateral primary osteoarthritis, left knee: Secondary | ICD-10-CM | POA: Diagnosis present

## 2016-04-04 DIAGNOSIS — Z96659 Presence of unspecified artificial knee joint: Secondary | ICD-10-CM

## 2016-04-04 DIAGNOSIS — Z888 Allergy status to other drugs, medicaments and biological substances status: Secondary | ICD-10-CM

## 2016-04-04 HISTORY — PX: TOTAL KNEE ARTHROPLASTY: SHX125

## 2016-04-04 LAB — TYPE AND SCREEN
ABO/RH(D): A POS
Antibody Screen: NEGATIVE

## 2016-04-04 SURGERY — ARTHROPLASTY, KNEE, TOTAL
Anesthesia: Monitor Anesthesia Care | Site: Knee | Laterality: Left

## 2016-04-04 MED ORDER — CEFAZOLIN SODIUM-DEXTROSE 2-4 GM/100ML-% IV SOLN
INTRAVENOUS | Status: AC
  Filled 2016-04-04: qty 100

## 2016-04-04 MED ORDER — ASPIRIN EC 325 MG PO TBEC: 325.0000 mg | DELAYED_RELEASE_TABLET | Freq: Two times a day (BID) | ORAL | 0 refills | Status: DC

## 2016-04-04 MED ORDER — METHOCARBAMOL 500 MG PO TABS
500.0000 mg | ORAL_TABLET | Freq: Four times a day (QID) | ORAL | Status: DC | PRN
  Administered 2016-04-04 – 2016-04-06 (×4): 500 mg via ORAL
  Filled 2016-04-04 (×4): qty 1

## 2016-04-04 MED ORDER — ACETAMINOPHEN 325 MG PO TABS
650.0000 mg | ORAL_TABLET | Freq: Four times a day (QID) | ORAL | Status: DC | PRN
  Administered 2016-04-04: 20:00:00 650 mg via ORAL
  Filled 2016-04-04: qty 2

## 2016-04-04 MED ORDER — POLYETHYLENE GLYCOL 3350 17 G PO PACK: 17.0000 g | PACK | Freq: Every day | ORAL | Status: DC | PRN

## 2016-04-04 MED ORDER — PHENOL 1.4 % MT LIQD
1.0000 | OROMUCOSAL | Status: DC | PRN
Start: 2016-04-04 — End: 2016-04-06

## 2016-04-04 MED ORDER — KCL IN DEXTROSE-NACL 20-5-0.45 MEQ/L-%-% IV SOLN
INTRAVENOUS | Status: AC
  Administered 2016-04-04: 16:00:00 50 mL/h via INTRAVENOUS
  Filled 2016-04-04 (×2): qty 1000

## 2016-04-04 MED ORDER — ASPIRIN EC 325 MG PO TBEC
325.0000 mg | DELAYED_RELEASE_TABLET | Freq: Two times a day (BID) | ORAL | Status: DC
  Administered 2016-04-04 – 2016-04-06 (×4): 325 mg via ORAL
  Filled 2016-04-04 (×4): qty 1

## 2016-04-04 MED ORDER — LACTATED RINGERS IV SOLN
INTRAVENOUS | Status: DC
  Administered 2016-04-04 (×2): via INTRAVENOUS

## 2016-04-04 MED ORDER — HYDROMORPHONE HCL 1 MG/ML IJ SOLN
0.5000 mg | INTRAMUSCULAR | Status: DC | PRN
  Administered 2016-04-04: 16:00:00 0.5 mg via INTRAVENOUS
  Filled 2016-04-04: qty 0.5

## 2016-04-04 MED ORDER — PERPHENAZINE 4 MG PO TABS
6.0000 mg | ORAL_TABLET | Freq: Every day | ORAL | Status: DC
  Administered 2016-04-04 – 2016-04-05 (×2): 6 mg via ORAL
  Filled 2016-04-04 (×2): qty 1

## 2016-04-04 MED ORDER — LIDOCAINE-EPINEPHRINE (PF) 1 %-1:200000 IJ SOLN
INTRAMUSCULAR | Status: DC | PRN
  Administered 2016-04-04: 20 mL

## 2016-04-04 MED ORDER — PROPOFOL 500 MG/50ML IV EMUL
INTRAVENOUS | Status: DC | PRN
  Administered 2016-04-04: 100 ug/kg/min via INTRAVENOUS

## 2016-04-04 MED ORDER — POLYETHYLENE GLYCOL 3350 17 G PO PACK: 17.0000 g | PACK | Freq: Every day | ORAL | 0 refills | Status: DC

## 2016-04-04 MED ORDER — FENTANYL CITRATE (PF) 100 MCG/2ML IJ SOLN
INTRAMUSCULAR | Status: DC | PRN
  Administered 2016-04-04 (×2): 50 ug via INTRAVENOUS

## 2016-04-04 MED ORDER — SIMILASAN DRY EYE RELIEF OP SOLN: Freq: Two times a day (BID) | OPHTHALMIC | Status: DC | PRN

## 2016-04-04 MED ORDER — PROPOFOL 10 MG/ML IV BOLUS
INTRAVENOUS | Status: AC
  Filled 2016-04-04: qty 20

## 2016-04-04 MED ORDER — RISAQUAD PO CAPS
1.0000 | ORAL_CAPSULE | Freq: Every day | ORAL | Status: DC
  Administered 2016-04-05 – 2016-04-06 (×2): 1 via ORAL
  Filled 2016-04-04 (×2): qty 1

## 2016-04-04 MED ORDER — ONDANSETRON HCL 4 MG/2ML IJ SOLN
INTRAMUSCULAR | Status: AC
  Filled 2016-04-04: qty 2

## 2016-04-04 MED ORDER — PROPOFOL 10 MG/ML IV BOLUS
INTRAVENOUS | Status: AC
  Filled 2016-04-04: qty 40

## 2016-04-04 MED ORDER — MENTHOL 3 MG MT LOZG: 1.0000 | LOZENGE | OROMUCOSAL | Status: DC | PRN

## 2016-04-04 MED ORDER — LORATADINE 10 MG PO TABS
10.0000 mg | ORAL_TABLET | Freq: Every day | ORAL | Status: DC
  Administered 2016-04-05 – 2016-04-06 (×2): 10 mg via ORAL
  Filled 2016-04-04 (×2): qty 1

## 2016-04-04 MED ORDER — OXYCODONE HCL 5 MG PO TABS
5.0000 mg | ORAL_TABLET | ORAL | Status: DC | PRN
  Administered 2016-04-04 – 2016-04-05 (×2): 10 mg via ORAL
  Administered 2016-04-05: 5 mg via ORAL
  Administered 2016-04-05 (×2): 10 mg via ORAL
  Administered 2016-04-05: 16:00:00 5 mg via ORAL
  Administered 2016-04-06 (×3): 10 mg via ORAL
  Filled 2016-04-04 (×3): qty 2
  Filled 2016-04-04: qty 1
  Filled 2016-04-04 (×5): qty 2

## 2016-04-04 MED ORDER — BUPIVACAINE HCL (PF) 0.25 % IJ SOLN
INTRAMUSCULAR | Status: AC
  Filled 2016-04-04: qty 30

## 2016-04-04 MED ORDER — ONDANSETRON HCL 4 MG/2ML IJ SOLN: 4.0000 mg | Freq: Four times a day (QID) | INTRAMUSCULAR | Status: DC | PRN

## 2016-04-04 MED ORDER — BUPIVACAINE HCL (PF) 0.75 % IJ SOLN
INTRAMUSCULAR | Status: DC | PRN
  Administered 2016-04-04: 1.8 mL

## 2016-04-04 MED ORDER — POLYVINYL ALCOHOL 1.4 % OP SOLN
1.0000 [drp] | OPHTHALMIC | Status: DC | PRN
  Filled 2016-04-04: qty 15

## 2016-04-04 MED ORDER — METOCLOPRAMIDE HCL 5 MG PO TABS: 5.0000 mg | ORAL_TABLET | Freq: Three times a day (TID) | ORAL | Status: DC | PRN

## 2016-04-04 MED ORDER — FENTANYL CITRATE (PF) 100 MCG/2ML IJ SOLN
INTRAMUSCULAR | Status: AC
  Filled 2016-04-04: qty 2

## 2016-04-04 MED ORDER — DOCUSATE SODIUM 100 MG PO CAPS: 100.0000 mg | ORAL_CAPSULE | Freq: Two times a day (BID) | ORAL | 1 refills | Status: DC | PRN

## 2016-04-04 MED ORDER — PANTOPRAZOLE SODIUM 40 MG PO TBEC
40.0000 mg | DELAYED_RELEASE_TABLET | Freq: Every day | ORAL | Status: DC
  Administered 2016-04-05 – 2016-04-06 (×2): 40 mg via ORAL
  Filled 2016-04-04 (×2): qty 1

## 2016-04-04 MED ORDER — PHENYLEPHRINE 40 MCG/ML (10ML) SYRINGE FOR IV PUSH (FOR BLOOD PRESSURE SUPPORT)
PREFILLED_SYRINGE | INTRAVENOUS | Status: AC
  Filled 2016-04-04: qty 10

## 2016-04-04 MED ORDER — ONDANSETRON HCL 4 MG/2ML IJ SOLN
INTRAMUSCULAR | Status: DC | PRN
  Administered 2016-04-04: 4 mg via INTRAVENOUS

## 2016-04-04 MED ORDER — METHOCARBAMOL 1000 MG/10ML IJ SOLN
500.0000 mg | Freq: Four times a day (QID) | INTRAVENOUS | Status: DC | PRN
  Administered 2016-04-04: 500 mg via INTRAVENOUS
  Filled 2016-04-04: qty 5
  Filled 2016-04-04: qty 550

## 2016-04-04 MED ORDER — CEFAZOLIN SODIUM-DEXTROSE 2-4 GM/100ML-% IV SOLN
2.0000 g | Freq: Four times a day (QID) | INTRAVENOUS | Status: AC
  Administered 2016-04-04 – 2016-04-05 (×3): 2 g via INTRAVENOUS
  Filled 2016-04-04 (×3): qty 100

## 2016-04-04 MED ORDER — ROPIVACAINE HCL 7.5 MG/ML IJ SOLN
INTRAMUSCULAR | Status: DC | PRN
  Administered 2016-04-04: 20 mL via PERINEURAL

## 2016-04-04 MED ORDER — MIDAZOLAM HCL 5 MG/5ML IJ SOLN
INTRAMUSCULAR | Status: DC | PRN
  Administered 2016-04-04 (×2): 1 mg via INTRAVENOUS

## 2016-04-04 MED ORDER — ROPIVACAINE HCL 7.5 MG/ML IJ SOLN
INTRAMUSCULAR | Status: AC
  Filled 2016-04-04: qty 20

## 2016-04-04 MED ORDER — HYDROMORPHONE HCL 1 MG/ML IJ SOLN
0.2500 mg | INTRAMUSCULAR | Status: DC | PRN
  Administered 2016-04-04 (×2): 0.5 mg via INTRAVENOUS

## 2016-04-04 MED ORDER — DEXAMETHASONE SODIUM PHOSPHATE 10 MG/ML IJ SOLN
INTRAMUSCULAR | Status: AC
  Filled 2016-04-04: qty 1

## 2016-04-04 MED ORDER — SODIUM CHLORIDE 0.9 % IR SOLN
Status: DC | PRN
  Administered 2016-04-04: 500 mL

## 2016-04-04 MED ORDER — LORAZEPAM 1 MG PO TABS
2.0000 mg | ORAL_TABLET | Freq: Every day | ORAL | Status: DC
  Administered 2016-04-04 – 2016-04-05 (×2): 2 mg via ORAL
  Filled 2016-04-04 (×2): qty 2

## 2016-04-04 MED ORDER — CEFAZOLIN SODIUM-DEXTROSE 2-4 GM/100ML-% IV SOLN
2.0000 g | INTRAVENOUS | Status: AC
  Administered 2016-04-04: 2 g via INTRAVENOUS
  Filled 2016-04-04: qty 100

## 2016-04-04 MED ORDER — LIDOCAINE-EPINEPHRINE (PF) 1 %-1:200000 IJ SOLN
INTRAMUSCULAR | Status: AC
  Filled 2016-04-04: qty 30

## 2016-04-04 MED ORDER — BISACODYL 5 MG PO TBEC: 5.0000 mg | DELAYED_RELEASE_TABLET | Freq: Every day | ORAL | Status: DC | PRN

## 2016-04-04 MED ORDER — ACETAMINOPHEN 650 MG RE SUPP: 650.0000 mg | Freq: Four times a day (QID) | RECTAL | Status: DC | PRN

## 2016-04-04 MED ORDER — TRANEXAMIC ACID 1000 MG/10ML IV SOLN
1000.0000 mg | INTRAVENOUS | Status: AC
  Administered 2016-04-04: 1000 mg via INTRAVENOUS
  Filled 2016-04-04: qty 1100

## 2016-04-04 MED ORDER — ONDANSETRON HCL 4 MG PO TABS: 4.0000 mg | ORAL_TABLET | Freq: Four times a day (QID) | ORAL | Status: DC | PRN

## 2016-04-04 MED ORDER — DEXAMETHASONE SODIUM PHOSPHATE 10 MG/ML IJ SOLN
INTRAMUSCULAR | Status: DC | PRN
  Administered 2016-04-04: 10 mg via INTRAVENOUS

## 2016-04-04 MED ORDER — ALUM & MAG HYDROXIDE-SIMETH 200-200-20 MG/5ML PO SUSP: 30.0000 mL | ORAL | Status: DC | PRN

## 2016-04-04 MED ORDER — DIPHENHYDRAMINE HCL 12.5 MG/5ML PO ELIX: 12.5000 mg | ORAL_SOLUTION | ORAL | Status: DC | PRN

## 2016-04-04 MED ORDER — HYDROMORPHONE HCL 1 MG/ML IJ SOLN
INTRAMUSCULAR | Status: AC
  Filled 2016-04-04: qty 1

## 2016-04-04 MED ORDER — BUPIVACAINE HCL (PF) 0.25 % IJ SOLN
INTRAMUSCULAR | Status: DC | PRN
  Administered 2016-04-04: 30 mL

## 2016-04-04 MED ORDER — ALBUTEROL SULFATE (2.5 MG/3ML) 0.083% IN NEBU: 3.0000 mL | INHALATION_SOLUTION | Freq: Four times a day (QID) | RESPIRATORY_TRACT | Status: DC | PRN

## 2016-04-04 MED ORDER — OXYCODONE-ACETAMINOPHEN 5-325 MG PO TABS: 1.0000 | ORAL_TABLET | ORAL | 0 refills | Status: DC | PRN

## 2016-04-04 MED ORDER — ACETAMINOPHEN 10 MG/ML IV SOLN
INTRAVENOUS | Status: AC
  Filled 2016-04-04: qty 100

## 2016-04-04 MED ORDER — SODIUM CHLORIDE 0.9 % IR SOLN
Status: DC | PRN
  Administered 2016-04-04: 2000 mL

## 2016-04-04 MED ORDER — MAGNESIUM CITRATE PO SOLN: 1.0000 | Freq: Once | ORAL | Status: DC | PRN

## 2016-04-04 MED ORDER — METOCLOPRAMIDE HCL 5 MG/ML IJ SOLN: 5.0000 mg | Freq: Three times a day (TID) | INTRAMUSCULAR | Status: DC | PRN

## 2016-04-04 MED ORDER — DOCUSATE SODIUM 100 MG PO CAPS
100.0000 mg | ORAL_CAPSULE | Freq: Two times a day (BID) | ORAL | Status: DC
  Administered 2016-04-04 – 2016-04-06 (×4): 100 mg via ORAL
  Filled 2016-04-04 (×4): qty 1

## 2016-04-04 MED ORDER — TRAMADOL HCL 50 MG PO TABS
50.0000 mg | ORAL_TABLET | Freq: Four times a day (QID) | ORAL | Status: DC | PRN
  Administered 2016-04-05 (×2): 50 mg via ORAL
  Filled 2016-04-04 (×2): qty 1

## 2016-04-04 MED ORDER — STERILE WATER FOR IRRIGATION IR SOLN
Status: DC | PRN
  Administered 2016-04-04: 2000 mL

## 2016-04-04 MED ORDER — MIDAZOLAM HCL 2 MG/2ML IJ SOLN
INTRAMUSCULAR | Status: AC
  Filled 2016-04-04: qty 2

## 2016-04-04 MED ORDER — ACETAMINOPHEN 10 MG/ML IV SOLN
1000.0000 mg | Freq: Once | INTRAVENOUS | Status: AC
  Administered 2016-04-04: 1000 mg via INTRAVENOUS

## 2016-04-04 MED ORDER — SODIUM CHLORIDE 0.9 % IR SOLN
Status: AC
  Filled 2016-04-04: qty 500000

## 2016-04-04 SURGICAL SUPPLY — 59 items
ATTUNE MED ANAT PAT 35 KNEE (Knees) IMPLANT
ATTUNE MED ANAT PAT 35MM KNEE (Knees) IMPLANT
BAG ZIPLOCK 12X15 (MISCELLANEOUS) ×3 IMPLANT
BANDAGE ACE 4X5 VEL STRL LF (GAUZE/BANDAGES/DRESSINGS) ×3 IMPLANT
BANDAGE ACE 6X5 VEL STRL LF (GAUZE/BANDAGES/DRESSINGS) ×3 IMPLANT
BLADE SAG 18X100X1.27 (BLADE) ×3 IMPLANT
BLADE SAW SGTL 13.0X1.19X90.0M (BLADE) ×3 IMPLANT
CAPT KNEE TOTAL 3 ATTUNE ×3 IMPLANT
CEMENT HV SMART SET (Cement) ×6 IMPLANT
CLOSURE WOUND 1/2 X4 (GAUZE/BANDAGES/DRESSINGS)
CLOTH 2% CHLOROHEXIDINE 3PK (PERSONAL CARE ITEMS) ×3 IMPLANT
CUFF TOURN SGL QUICK 34 (TOURNIQUET CUFF) ×2
CUFF TRNQT CYL 34X4X40X1 (TOURNIQUET CUFF) ×1 IMPLANT
DECANTER SPIKE VIAL GLASS SM (MISCELLANEOUS) ×3 IMPLANT
DRAPE ORTHO SPLIT 77X108 STRL (DRAPES) ×4
DRAPE SHEET LG 3/4 BI-LAMINATE (DRAPES) ×3 IMPLANT
DRAPE SURG ORHT 6 SPLT 77X108 (DRAPES) ×2 IMPLANT
DRAPE U-SHAPE 47X51 STRL (DRAPES) ×3 IMPLANT
DRSG AQUACEL AG ADV 3.5X10 (GAUZE/BANDAGES/DRESSINGS) ×3 IMPLANT
DURAPREP 26ML APPLICATOR (WOUND CARE) ×3 IMPLANT
ELECT REM PT RETURN 9FT ADLT (ELECTROSURGICAL) ×3
ELECTRODE REM PT RTRN 9FT ADLT (ELECTROSURGICAL) ×1 IMPLANT
EVACUATOR 1/8 PVC DRAIN (DRAIN) IMPLANT
GLOVE BIOGEL PI IND STRL 7.0 (GLOVE) ×1 IMPLANT
GLOVE BIOGEL PI IND STRL 7.5 (GLOVE) ×3 IMPLANT
GLOVE BIOGEL PI IND STRL 8 (GLOVE) ×2 IMPLANT
GLOVE BIOGEL PI INDICATOR 7.0 (GLOVE) ×2
GLOVE BIOGEL PI INDICATOR 7.5 (GLOVE) ×6
GLOVE BIOGEL PI INDICATOR 8 (GLOVE) ×4
GLOVE SURG SS PI 7.0 STRL IVOR (GLOVE) ×3 IMPLANT
GLOVE SURG SS PI 7.5 STRL IVOR (GLOVE) ×6 IMPLANT
GLOVE SURG SS PI 8.0 STRL IVOR (GLOVE) ×9 IMPLANT
GOWN SPEC L3 XXLG W/TWL (GOWN DISPOSABLE) ×3 IMPLANT
GOWN STRL REUS W/TWL 2XL LVL3 (GOWN DISPOSABLE) ×3 IMPLANT
GOWN STRL REUS W/TWL XL LVL3 (GOWN DISPOSABLE) ×6 IMPLANT
HANDPIECE INTERPULSE COAX TIP (DISPOSABLE) ×2
HEMOSTAT SPONGE AVITENE ULTRA (HEMOSTASIS) ×3 IMPLANT
IMMOBILIZER KNEE 20 (SOFTGOODS) ×3
IMMOBILIZER KNEE 20 THIGH 36 (SOFTGOODS) ×1 IMPLANT
MANIFOLD NEPTUNE II (INSTRUMENTS) ×3 IMPLANT
PACK TOTAL KNEE CUSTOM (KITS) ×3 IMPLANT
POSITIONER SURGICAL ARM (MISCELLANEOUS) ×3 IMPLANT
SET HNDPC FAN SPRY TIP SCT (DISPOSABLE) ×1 IMPLANT
SPONGE SURGIFOAM ABS GEL 100 (HEMOSTASIS) IMPLANT
STAPLER VISISTAT (STAPLE) ×3 IMPLANT
STRIP CLOSURE SKIN 1/2X4 (GAUZE/BANDAGES/DRESSINGS) IMPLANT
SUT BONE WAX W31G (SUTURE) IMPLANT
SUT MNCRL AB 4-0 PS2 18 (SUTURE) IMPLANT
SUT STRATAFIX 0 PDS 27 VIOLET (SUTURE) ×3
SUT VIC AB 1 CT1 27 (SUTURE) ×4
SUT VIC AB 1 CT1 27XBRD ANTBC (SUTURE) ×2 IMPLANT
SUT VIC AB 2-0 CT1 27 (SUTURE) ×6
SUT VIC AB 2-0 CT1 TAPERPNT 27 (SUTURE) ×3 IMPLANT
SUTURE STRATFX 0 PDS 27 VIOLET (SUTURE) ×1 IMPLANT
SYR 50ML LL SCALE MARK (SYRINGE) ×3 IMPLANT
TOWER CARTRIDGE SMART MIX (DISPOSABLE) ×3 IMPLANT
TRAY FOLEY CATH SILVER 14FR (SET/KITS/TRAYS/PACK) ×3 IMPLANT
WRAP KNEE MAXI GEL POST OP (GAUZE/BANDAGES/DRESSINGS) ×3 IMPLANT
YANKAUER SUCT BULB TIP 10FT TU (MISCELLANEOUS) ×3 IMPLANT

## 2016-04-04 NOTE — Transfer of Care (Signed)
Immediate Anesthesia Transfer of Care Note  Patient: [name]  Procedure(s) Performed: Procedure(s) with comments: LEFT TOTAL KNEE ARTHROPLASTY (Left) - Adductor Block  Patient Location: PACU  Anesthesia Type:Regional and Spinal  Level of Consciousness: sedated  Airway & Oxygen Therapy: Patient Spontanous Breathing and Patient connected to face mask oxygen  Post-op Assessment: Report given to RN and Post -op Vital signs reviewed and stable  Post vital signs: Reviewed and stable  Last Vitals:  Vitals:   04/04/16 0530  BP: 135/84  Pulse: 100  Resp: 20  Temp: 37.1 C    Last Pain:  Vitals:   04/04/16 0547  TempSrc:   PainSc: 5       Patients Stated Pain Goal: 3 ([id number])  Complications: No apparent anesthesia complications

## 2016-04-04 NOTE — Brief Op Note (Signed)
04/04/2016  9:26 AM  PATIENT:  [name]  75 y.o. female  PRE-OPERATIVE DIAGNOSIS:  left knee DJD  POST-OPERATIVE DIAGNOSIS:  left knee DJD  PROCEDURE:  Procedure(s) with comments: LEFT TOTAL KNEE ARTHROPLASTY (Left) - Adductor Block  SURGEON:  Surgeon(s) and Role:    * Susa Day, MD - Primary  PHYSICIAN ASSISTANT:   ASSISTANTS: [place]   ANESTHESIA:   spinal  EBL:  Total I/O In: 1000 [I.V.:1000] Out: 100 [Urine:100]  BLOOD ADMINISTERED:none  DRAINS: none   LOCAL MEDICATIONS USED:  MARCAINE     SPECIMEN:  No Specimen  DISPOSITION OF SPECIMEN:  N/A  COUNTS:  YES  TOURNIQUET:   Total Tourniquet Time Documented: Thigh (Left) - 59 minutes Total: Thigh (Left) - 59 minutes   DICTATION: .Other Dictation: Dictation Number  [id number]  PLAN OF CARE: Admit to inpatient   PATIENT DISPOSITION:  PACU - hemodynamically stable.   Delay start of Pharmacological VTE agent (>24hrs) due to surgical blood loss or risk of bleeding: no

## 2016-04-04 NOTE — Anesthesia Procedure Notes (Signed)
Spinal  Patient location during procedure: OR Start time: 04/04/2016 7:34 AM End time: 04/04/2016 7:36 AM Staffing Resident/CRNA: Harle Stanford R Performed: resident/CRNA  Preanesthetic Checklist Completed: patient identified, site marked, surgical consent, pre-op evaluation, timeout performed, IV checked, risks and benefits discussed and monitors and equipment checked Spinal Block Patient position: sitting Prep: Betadine Patient monitoring: heart rate Approach: midline Location: L3-4 Injection technique: single-shot Needle Needle type: Spinocan  Needle gauge: 24 G Needle length: 10 cm Needle insertion depth: 7 cm Assessment Sensory level: T6 Additional Notes Timeout performed. SAB kit date checked. SAB without difficulty

## 2016-04-04 NOTE — H&P (Signed)
[name] DOB: [birth year] Separated / Language: [name] / Race: White Female  H&P Date: 03/13/16  Chief Complaint: Left knee pain  History of Present Illness The patient is a 75 year old female who comes in today for a preoperative History and Physical. The patient is scheduled for a left total knee arthroplasty to be performed by Dr. Johnn Hai, MD at [facility] on 04/04/16. [name] reports chronic pain L knee refractory to conservative tx including injections with steroid, viscosupplementation, bracing, activity modifcations, relative rest, quad strengthening, and medications. Pain is interfering with her ADLs and quality of life at this point and she desires to proceed with surgery.  Dr. Tonita Cong and the patient mutually agreed to proceed with a left total knee replacement. Risks and benefits of the procedure were discussed including stiffness, suboptimal range of motion, persistent pain, infection requiring removal of prosthesis and reinsertion, need for prophylactic antibiotics in the future, for example, dental procedures, possible need for manipulation, revision in the future and also anesthetic complications including DVT, PE, etc. We discussed the perioperative course, time in the hospital, postoperative recovery and the need for elevation to control swelling. We also discussed the predicted range of motion and the probability that squatting and kneeling would be unobtainable in the future. In addition, postoperative anticoagulation was discussed. We have obtained preoperative medical clearance as necessary. Provided her illustrated handout and discussed it in detail. They will enroll in the total joint replacement educational forum at the hospital.  Problem List/Past Medical Hx Thoracic DDD (M51.34)  Thoracic spine pain (M54.6) Anxiety Disorder  Asthma  Diverticulitis Of Colon  Gastroesophageal Reflux Disease  Osteoarthritis  Diverticulosis  and colon  polyps Anemia  prior hx of blood transfusion post-op anemia Cataract  s/p surgery   Allergies  Haldol *ANTIPSYCHOTICS/ANTIMANIC AGENTS*  Anaphylaxis. ARIPiprazole *ANTIPSYCHOTICS/ANTIMANIC AGENTS*  Allergies Reconciled   Family History Cancer  Maternal Grandfather. Cerebrovascular Accident  Mother. Chronic Obstructive Lung Disease  Sister. Congestive Heart Failure  Mother. Drug / Alcohol Addiction  Father. First Degree Relatives  reported Heart disease in female family member before age 28  Hypertension  Mother. Rheumatoid Arthritis  Sister. Father  MI Diabetes Mellitus  Paternal Grandmother, Paternal Aunt. Colon Polyps  niece  Social History Tobacco use  Never smoker. 08/25/2014 Children  5 or more Current drinker  08/25/2014: Currently drinks wine only occasionally per week Current work status  retired Exercise  Exercises rarely; does other and team sport Living situation  live alone- 4 steps to enter one level home Marital status  married No history of drug/alcohol rehab  Not under pain contract  Number of flights of stairs before winded  greater than 5 Tobacco / smoke exposure  08/25/2014: no Post-Surgical Plans  short term rehab stay then home with HHPT/outpt PT depending on length of rehab stay  Medication History  LORazepam (2MG  Tablet, Oral) Active. PriLOSEC OTC (20MG  Tablet DR, Oral) Active. (prn) Perphenazine (2MG  Tablet, 6mg  Oral qhs) Active. TraMADol HCl (50MG  Tablet, Oral) Active. (prn) Acetaminophen (Oral) Specific strength unknown - Active. (prn) Albuterol (Inhalation) Specific strength unknown - Active. (prn) Medications Reconciled  Past Surgical History  Appendectomy  Cataract Surgery  bilateral Foot Surgery  right- heel spur Gallbladder Surgery  laporoscopic Tonsillectomy  Nephrectomy  Exploratory Laparotomy  due to endometriosis Hernia Repair  incisional Cholecystectomy  Colon Removal - Partial   Laparoscopic. 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 Skin Not Present- Eczema, Hives, Itching, Lesions and Rash. HEENT Not Present- Dentures, Double Vision, Headache, Hearing Loss, Tinnitus and Visual Loss. Respiratory Not Present- Allergies, Chronic Cough, Coughing up blood, Shortness of breath at rest and Shortness of breath with exertion. Cardiovascular Not Present- Chest Pain, Difficulty Breathing Lying Down, Murmur, Palpitations, Racing/skipping heartbeats and Swelling. Gastrointestinal Present- Heartburn. Not Present- Abdominal Pain, Bloody Stool, Constipation, Diarrhea, Difficulty Swallowing, Jaundice, Loss of appetitie, Nausea and Vomiting. Female Genitourinary Not Present- Blood in Urine, Discharge, Flank Pain, Incontinence, Painful Urination, Urgency, Urinary frequency, Urinary Retention, Urinating at Night and Weak urinary stream. Musculoskeletal Present- Joint Pain, Joint Swelling, Morning Stiffness and Muscle Pain. Not Present- Back Pain, Muscle Weakness and Spasms. Neurological Not Present- Blackout spells, Difficulty with balance, Dizziness, Paralysis, Tremor and Weakness. Psychiatric Not Present- Insomnia.  Physical Exam General Mental Status -Alert, cooperative and good historian. General Appearance-pleasant, Not in acute distress. Orientation-Oriented X3. Build & Nutrition-Well nourished and Well developed.  Head and Neck Head-normocephalic, atraumatic . Neck Global Assessment - supple, no bruit auscultated on the right, no bruit auscultated on the left.  Eye Pupil - Bilateral-Regular and Round. Motion - Bilateral-EOMI.  Chest and Lung Exam Auscultation Breath sounds - clear at anterior chest wall and clear at posterior chest wall. Adventitious sounds - No Adventitious  sounds.  Cardiovascular Auscultation Rhythm - Regular rate and rhythm. Heart Sounds - S1 WNL and S2 WNL. Murmurs & Other Heart Sounds - Auscultation of the heart reveals - No Murmurs.  Abdomen Palpation/Percussion Tenderness - Abdomen is non-tender to palpation. Rigidity (guarding) - Abdomen is soft. Auscultation Auscultation of the abdomen reveals - Bowel sounds normal.  Female Genitourinary Not done, not pertinent to present illness   Musculoskeletal On exam, she is obese. She has an antalgic gait. Left knee, trace effusion. She is tender to palpation medial and lateral joint lines, nontender patella, patellar tendon, quad tendon, fibular head, peroneal nerve, popliteal space. No calf pain or sign of DVT. No pain or laxity with varus or valgus stress. No instability. Range of motion approximately 0 to 110.  Imaging New x-rays ordered, obtained, reviewed today. No fracture, subluxation, dislocation, lytic or blastic lesions. Severe medial joint space narrowing, left knee; moderately severe, right knee. Left knee bone on bone medial joint space with tricompartmental arthrosis, bone spurring noted.  Assessment & Plan Primary osteoarthritis of left knee (M17.12)  Pt with end-stage L knee DJD, bone-on-bone, refractory to conservative tx, scheduled for L total knee replacement by Dr. Tonita Cong on 04/04/16. We again discussed the procedure itself as well as risks, complications and alternatives, including but not limited to DVT, PE, infx, bleeding, failure of procedure, need for secondary procedure including manipulation, nerve injury, ongoing pain/symptoms, anesthesia risk, even stroke or death. Also discussed typical post-op protocols, activity restrictions, need for PT, flexion/extension exercises, time out of work. Discussed need for DVT ppx post-op with Xarelto then ASA per protocol. Discussed dental ppx. Also discussed limitations post-operatively such as kneeling and squatting. All  questions were answered. Patient desires to proceed with surgery as scheduled. Will hold supplements, ASA and NSAIDs accordingly. Will remain NPO after MN night before surgery. Will present to [facility] for pre-op testing. Plan ASA for DVT ppx. Plan pain medication, Robaxin, Colace. Plan home with HHPT post-op with family members at home for assistance. Will follow up 10-14 days post-op for suture removal and xrays.  Left total knee replacement  Signed electronically by Ellison Hughs  Deatra Robinson, PA-C for Dr. Tonita Cong

## 2016-04-04 NOTE — Discharge Instructions (Signed)
Elevate leg above heart 6x a day for 2minutes each Use knee immobilizer while walking until can SLR x 10 Use knee immobilizer in bed to keep knee in extension Aquacel dressing may remain in place until follow up. May shower with aquacel dressing in place. If the dressing becomes saturated or peels off, you may remove aquacel dressing. Place new dressing with gauze and tape or ACE bandage which should be kept clean and dry and changed daily.  INSTRUCTIONS AFTER JOINT REPLACEMENT   o Remove items at home which could result in a fall. This includes throw rugs or furniture in walking pathways o ICE to the affected joint every three hours while awake for 30 minutes at a time, for at least the first 3-5 days, and then as needed for pain and swelling.  Continue to use ice for pain and swelling. You may notice swelling that will progress down to the foot and ankle.  This is normal after surgery.  Elevate your leg when you are not up walking on it.   o Continue to use the breathing machine you got in the hospital (incentive spirometer) which will help keep your temperature down.  It is common for your temperature to cycle up and down following surgery, especially at night when you are not up moving around and exerting yourself.  The breathing machine keeps your lungs expanded and your temperature down.   DIET:  As you were doing prior to hospitalization, we recommend a well-balanced diet.  DRESSING / WOUND CARE / SHOWERING  You may shower 3 days after surgery, but keep the wounds dry during showering.  You may use an occlusive plastic wrap (Press'n Seal for example), NO SOAKING/SUBMERGING IN THE BATHTUB.  If the bandage gets wet, change with a clean dry gauze.  If the incision gets wet, pat the wound dry with a clean towel.  ACTIVITY  o Increase activity slowly as tolerated, but follow the weight bearing instructions below.   o No driving for 6 weeks or until further direction given by your physician.   You cannot drive while taking narcotics.  o No lifting or carrying greater than 10 lbs. until further directed by your surgeon. o Avoid periods of inactivity such as sitting longer than an hour when not asleep. This helps prevent blood clots.  o You may return to work once you are authorized by your doctor.     WEIGHT BEARING   Partial weight bearing with assist device as directed.  left   EXERCISES  Results after joint replacement surgery are often greatly improved when you follow the exercise, range of motion and muscle strengthening exercises prescribed by your doctor. Safety measures are also important to protect the joint from further injury. Any time any of these exercises cause you to have increased pain or swelling, decrease what you are doing until you are comfortable again and then slowly increase them. If you have problems or questions, call your caregiver or physical therapist for advice.   Rehabilitation is important following a joint replacement. After just a few days of immobilization, the muscles of the leg can become weakened and shrink (atrophy).  These exercises are designed to build up the tone and strength of the thigh and leg muscles and to improve motion. Often times heat used for twenty to thirty minutes before working out will loosen up your tissues and help with improving the range of motion but do not use heat for the first two weeks following surgery (sometimes  heat can increase post-operative swelling).   These exercises can be done on a training (exercise) mat, on the floor, on a table or on a bed. Use whatever works the best and is most comfortable for you.    Use music or television while you are exercising so that the exercises are a pleasant break in your day. This will make your life better with the exercises acting as a break in your routine that you can look forward to.   Perform all exercises about fifteen times, three times per day or as directed.  You should  exercise both the operative leg and the other leg as well.  Exercises include:    Quad Sets - Tighten up the muscle on the front of the thigh (Quad) and hold for 5-10 seconds.    Straight Leg Raises - With your knee straight (if you were given a brace, keep it on), lift the leg to 60 degrees, hold for 3 seconds, and slowly lower the leg.  Perform this exercise against resistance later as your leg gets stronger.   Leg Slides: Lying on your back, slowly slide your foot toward your buttocks, bending your knee up off the floor (only go as far as is comfortable). Then slowly slide your foot back down until your leg is flat on the floor again.   Angel Wings: Lying on your back spread your legs to the side as far apart as you can without causing discomfort.   Hamstring Strength:  Lying on your back, push your heel against the floor with your leg straight by tightening up the muscles of your buttocks.  Repeat, but this time bend your knee to a comfortable angle, and push your heel against the floor.  You may put a pillow under the heel to make it more comfortable if necessary.   A rehabilitation program following joint replacement surgery can speed recovery and prevent re-injury in the future due to weakened muscles. Contact your doctor or a physical therapist for more information on knee rehabilitation.    CONSTIPATION  Constipation is defined medically as fewer than three stools per week and severe constipation as less than one stool per week.  Even if you have a regular bowel pattern at home, your normal regimen is likely to be disrupted due to multiple reasons following surgery.  Combination of anesthesia, postoperative narcotics, change in appetite and fluid intake all can affect your bowels.   YOU MUST use at least one of the following options; they are listed in order of increasing strength to get the job done.  They are all available over the counter, and you may need to use some, POSSIBLY even  all of these options:    Drink plenty of fluids (prune juice may be helpful) and high fiber foods Colace 100 mg by mouth twice a day  Senokot for constipation as directed and as needed Dulcolax (bisacodyl), take with full glass of water  Miralax (polyethylene glycol) once or twice a day as needed.  If you have tried all these things and are unable to have a bowel movement in the first 3-4 days after surgery call either your surgeon or your primary doctor.    If you experience loose stools or diarrhea, hold the medications until you stool forms back up.  If your symptoms do not get better within 1 week or if they get worse, check with your doctor.  If you experience "the worst abdominal pain ever" or develop nausea or vomiting,  please contact the office immediately for further recommendations for treatment.   ITCHING:  If you experience itching with your medications, try taking only a single pain pill, or even half a pain pill at a time.  You can also use Benadryl over the counter for itching or also to help with sleep.   TED HOSE STOCKINGS:  Use stockings on both legs until for at least 2 weeks or as directed by physician office. They may be removed at night for sleeping.  MEDICATIONS:  See your medication summary on the After Visit Summary that nursing will review with you.  You may have some home medications which will be placed on hold until you complete the course of blood thinner medication.  It is important for you to complete the blood thinner medication as prescribed.  PRECAUTIONS:  If you experience chest pain or shortness of breath - call 911 immediately for transfer to the hospital emergency department.   If you develop a fever greater that 101 F, purulent drainage from wound, increased redness or drainage from wound, foul odor from the wound/dressing, or calf pain - CONTACT YOUR SURGEON.                                                   FOLLOW-UP APPOINTMENTS:  If you do not  already have a post-op appointment, please call the office for an appointment to be seen by your surgeon.  Guidelines for how soon to be seen are listed in your After Visit Summary, but are typically between 1-4 weeks after surgery.  OTHER INSTRUCTIONS:   Knee Replacement:  Do not place pillow under knee, focus on keeping the knee straight while resting. CPM instructions: 0-90 degrees, 2 hours in the morning, 2 hours in the afternoon, and 2 hours in the evening. Place foam block, curve side up under heel at all times except when in CPM or when walking.  DO NOT modify, tear, cut, or change the foam block in any way.  MAKE SURE YOU:   Understand these instructions.   Get help right away if you are not doing well or get worse.    Thank you for letting us be a part of your medical care team.  It is a privilege we respect greatly.  We hope these instructions will help you stay on track for a fast and full recovery!

## 2016-04-04 NOTE — Anesthesia Procedure Notes (Addendum)
Anesthesia Regional Block:  Adductor canal block  Pre-Anesthetic Checklist: ,, timeout performed, Correct Patient, Correct Site, Correct Laterality, Correct Procedure, Correct Position, site marked, Risks and benefits discussed, pre-op evaluation,  At surgeon's request and post-op pain management  Laterality: Left  Prep: Maximum Sterile Barrier Precautions used, chloraprep       Needles:  Injection technique: Single-shot  Needle Type: Echogenic Stimulator Needle     Needle Length: 9cm 9 cm Needle Gauge: 21 and 21 G    Additional Needles:  Procedures: ultrasound guided (picture in chart) Adductor canal block Narrative:  Start time: 04/04/2016 6:50 AM End time: 04/04/2016 7:00 AM Injection made incrementally with aspirations every 5 mL. Anesthesiologist: Roderic Palau  Additional Notes: 2% Lidocaine skin wheel.

## 2016-04-04 NOTE — Interval H&P Note (Signed)
History and Physical Interval Note:  04/04/2016 7:28 AM  [name]  has presented today for surgery, with the diagnosis of left knee DJD  The various methods of treatment have been discussed with the patient and family. After consideration of risks, benefits and other options for treatment, the patient has consented to  Procedure(s) with comments: LEFT TOTAL KNEE ARTHROPLASTY (Left) - Adductor Block as a surgical intervention .  The patient's history has been reviewed, patient examined, no change in status, stable for surgery.  I have reviewed the patient's chart and labs.  Questions were answered to the patient's satisfaction.     [name] C

## 2016-04-04 NOTE — H&P (View-Only) (Signed)
[name] DOB: [birth year] Separated / Language: [name] / Race: White Female  H&P Date: 03/13/16  Chief Complaint: Left knee pain  History of Present Illness The patient is a 75 year old female who comes in today for a preoperative History and Physical. The patient is scheduled for a left total knee arthroplasty to be performed by Dr. Johnn Hai, MD at [facility] on 04/04/16. [name] reports chronic pain L knee refractory to conservative tx including injections with steroid, viscosupplementation, bracing, activity modifcations, relative rest, quad strengthening, and medications. Pain is interfering with her ADLs and quality of life at this point and she desires to proceed with surgery.  Dr. Tonita Logan and the patient mutually agreed to proceed with a left total knee replacement. Risks and benefits of the procedure were discussed including stiffness, suboptimal range of motion, persistent pain, infection requiring removal of prosthesis and reinsertion, need for prophylactic antibiotics in the future, for example, dental procedures, possible need for manipulation, revision in the future and also anesthetic complications including DVT, PE, etc. We discussed the perioperative course, time in the hospital, postoperative recovery and the need for elevation to control swelling. We also discussed the predicted range of motion and the probability that squatting and kneeling would be unobtainable in the future. In addition, postoperative anticoagulation was discussed. We have obtained preoperative medical clearance as necessary. Provided her illustrated handout and discussed it in detail. They will enroll in the total joint replacement educational forum at the hospital.  Problem List/Past Medical Hx Thoracic DDD (M51.34)  Thoracic spine pain (M54.6) Anxiety Disorder  Asthma  Diverticulitis Of Colon  Gastroesophageal Reflux Disease  Osteoarthritis  Diverticulosis  and colon  polyps Anemia  prior hx of blood transfusion post-op anemia Cataract  s/p surgery   Allergies  Haldol *ANTIPSYCHOTICS/ANTIMANIC AGENTS*  Anaphylaxis. ARIPiprazole *ANTIPSYCHOTICS/ANTIMANIC AGENTS*  Allergies Reconciled   Family History Cancer  Maternal Grandfather. Cerebrovascular Accident  Mother. Chronic Obstructive Lung Disease  Sister. Congestive Heart Failure  Mother. Drug / Alcohol Addiction  Father. First Degree Relatives  reported Heart disease in female family member before age 81  Hypertension  Mother. Rheumatoid Arthritis  Sister. Father  MI Diabetes Mellitus  Paternal Grandmother, Paternal Aunt. Colon Polyps  niece  Social History Tobacco use  Never smoker. 08/25/2014 Children  5 or more Current drinker  08/25/2014: Currently drinks wine only occasionally per week Current work status  retired Exercise  Exercises rarely; does other and team sport Living situation  live alone- 4 steps to enter one level home Marital status  married No history of drug/alcohol rehab  Not under pain contract  Number of flights of stairs before winded  greater than 5 Tobacco / smoke exposure  08/25/2014: no Post-Surgical Plans  short term rehab stay then home with HHPT/outpt PT depending on length of rehab stay  Medication History  LORazepam (2MG  Tablet, Oral) Active. PriLOSEC OTC (20MG  Tablet DR, Oral) Active. (prn) Perphenazine (2MG  Tablet, 6mg  Oral qhs) Active. TraMADol HCl (50MG  Tablet, Oral) Active. (prn) Acetaminophen (Oral) Specific strength unknown - Active. (prn) Albuterol (Inhalation) Specific strength unknown - Active. (prn) Medications Reconciled  Past Surgical History  Appendectomy  Cataract Surgery  bilateral Foot Surgery  right- heel spur Gallbladder Surgery  laporoscopic Tonsillectomy  Nephrectomy  Exploratory Laparotomy  due to endometriosis Hernia Repair  incisional Cholecystectomy  Colon Removal - Partial   Laparoscopic. 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 Skin Not Present- Eczema, Hives, Itching, Lesions and Rash. HEENT Not Present- Dentures, Double Vision, Headache, Hearing Loss, Tinnitus and Visual Loss. Respiratory Not Present- Allergies, Chronic Cough, Coughing up blood, Shortness of breath at rest and Shortness of breath with exertion. Cardiovascular Not Present- Chest Pain, Difficulty Breathing Lying Down, Murmur, Palpitations, Racing/skipping heartbeats and Swelling. Gastrointestinal Present- Heartburn. Not Present- Abdominal Pain, Bloody Stool, Constipation, Diarrhea, Difficulty Swallowing, Jaundice, Loss of appetitie, Nausea and Vomiting. Female Genitourinary Not Present- Blood in Urine, Discharge, Flank Pain, Incontinence, Painful Urination, Urgency, Urinary frequency, Urinary Retention, Urinating at Night and Weak urinary stream. Musculoskeletal Present- Joint Pain, Joint Swelling, Morning Stiffness and Muscle Pain. Not Present- Back Pain, Muscle Weakness and Spasms. Neurological Not Present- Blackout spells, Difficulty with balance, Dizziness, Paralysis, Tremor and Weakness. Psychiatric Not Present- Insomnia.  Physical Exam General Mental Status -Alert, cooperative and good historian. General Appearance-pleasant, Not in acute distress. Orientation-Oriented X3. Build & Nutrition-Well nourished and Well developed.  Head and Neck Head-normocephalic, atraumatic . Neck Global Assessment - supple, no bruit auscultated on the right, no bruit auscultated on the left.  Eye Pupil - Bilateral-Regular and Round. Motion - Bilateral-EOMI.  Chest and Lung Exam Auscultation Breath sounds - clear at anterior chest wall and clear at posterior chest wall. Adventitious sounds - No Adventitious  sounds.  Cardiovascular Auscultation Rhythm - Regular rate and rhythm. Heart Sounds - S1 WNL and S2 WNL. Murmurs & Other Heart Sounds - Auscultation of the heart reveals - No Murmurs.  Abdomen Palpation/Percussion Tenderness - Abdomen is non-tender to palpation. Rigidity (guarding) - Abdomen is soft. Auscultation Auscultation of the abdomen reveals - Bowel sounds normal.  Female Genitourinary Not done, not pertinent to present illness   Musculoskeletal On exam, she is obese. She has an antalgic gait. Left knee, trace effusion. She is tender to palpation medial and lateral joint lines, nontender patella, patellar tendon, quad tendon, fibular head, peroneal nerve, popliteal space. No calf pain or sign of DVT. No pain or laxity with varus or valgus stress. No instability. Range of motion approximately 0 to 110.  Imaging New x-rays ordered, obtained, reviewed today. No fracture, subluxation, dislocation, lytic or blastic lesions. Severe medial joint space narrowing, left knee; moderately severe, right knee. Left knee bone on bone medial joint space with tricompartmental arthrosis, bone spurring noted.  Assessment & Plan Primary osteoarthritis of left knee (M17.12)  Pt with end-stage L knee DJD, bone-on-bone, refractory to conservative tx, scheduled for L total knee replacement by Dr. Tonita Logan on 04/04/16. We again discussed the procedure itself as well as risks, complications and alternatives, including but not limited to DVT, PE, infx, bleeding, failure of procedure, need for secondary procedure including manipulation, nerve injury, ongoing pain/symptoms, anesthesia risk, even stroke or death. Also discussed typical post-op protocols, activity restrictions, need for PT, flexion/extension exercises, time out of work. Discussed need for DVT ppx post-op with Xarelto then ASA per protocol. Discussed dental ppx. Also discussed limitations post-operatively such as kneeling and squatting. All questions  were answered. Patient desires to proceed with surgery as scheduled. Will hold supplements, ASA and NSAIDs accordingly. Will remain NPO after MN night before surgery. Will present to [facility] for pre-op testing. Plan ASA for DVT ppx. Plan pain medication, Robaxin, Colace. Plan home with HHPT post-op with family members at home for assistance. Will follow up 10-14 days post-op for suture removal and xrays.  Left total knee replacement  Signed electronically by Ellison Hughs  Deatra Robinson, PA-C for Dr. Tonita Logan

## 2016-04-04 NOTE — Anesthesia Postprocedure Evaluation (Signed)
Anesthesia Post Note  Patient: [name]  Procedure(s) Performed: Procedure(s) (LRB): LEFT TOTAL KNEE ARTHROPLASTY (Left)  Patient location during evaluation: PACU Anesthesia Type: Spinal and MAC Level of consciousness: awake and alert Pain management: pain level controlled Vital Signs Assessment: post-procedure vital signs reviewed and stable Respiratory status: spontaneous breathing and respiratory function stable Cardiovascular status: blood pressure returned to baseline and stable Postop Assessment: spinal receding Anesthetic complications: no       Last Vitals:  Vitals:   04/04/16 1000 04/04/16 1015  BP: (!) 157/79   Pulse: 74 81  Resp: (!) 7 18  Temp:      Last Pain:  Vitals:   04/04/16 1015  TempSrc:   PainSc: 5                  Aliene Tamura,W. EDMOND

## 2016-04-05 LAB — BASIC METABOLIC PANEL
Anion gap: 9 (ref 5–15)
BUN: 15 mg/dL (ref 6–20)
CALCIUM: 8.8 mg/dL — AB (ref 8.9–10.3)
CHLORIDE: 99 mmol/L — AB (ref 101–111)
CO2: 28 mmol/L (ref 22–32)
CREATININE: 1.07 mg/dL — AB (ref 0.44–1.00)
GFR, EST AFRICAN AMERICAN: 58 mL/min — AB (ref >60)
GFR, EST NON AFRICAN AMERICAN: 50 mL/min — AB (ref >60)
Glucose, Bld: 116 mg/dL — ABNORMAL HIGH (ref 65–99)
Potassium: 4.2 mmol/L (ref 3.5–5.1)
SODIUM: 136 mmol/L (ref 135–145)

## 2016-04-05 LAB — CBC
HCT: 39.1 % (ref 36.0–46.0)
HEMOGLOBIN: 13.1 g/dL (ref 12.0–15.0)
MCH: 30.1 pg (ref 26.0–34.0)
MCHC: 33.5 g/dL (ref 30.0–36.0)
MCV: 89.9 fL (ref 78.0–100.0)
PLATELETS: 251 10*3/uL (ref 150–400)
RBC: 4.35 MIL/uL (ref 3.87–5.11)
RDW: 12.7 % (ref 11.5–15.5)
WBC: 14.9 10*3/uL — ABNORMAL HIGH (ref 4.0–10.5)

## 2016-04-05 NOTE — Evaluation (Signed)
Physical Therapy Evaluation Patient Details Name: [name] MRN: [id number] DOB: [birth year] Today's Date: 04/05/2016   History of Present Illness  Left total knee arthroplasty  Clinical Impression  Pt s/p L TKR presents with decreased L LE strength/ROM and post op pain limiting functional mobility.  Pt would benefit from follow up SNF level rehab to maximize IND and safety prior to return home alone.    Follow Up Recommendations SNF    Equipment Recommendations  Rolling walker with 5" wheels    Recommendations for Other Services OT consult     Precautions / Restrictions Precautions Precautions: Knee Precaution Comments: educated on no pillow under knee Required Braces or Orthoses: Knee Immobilizer - Left Knee Immobilizer - Left: Discontinue once straight leg raise with < 10 degree lag Restrictions Weight Bearing Restrictions: No Other Position/Activity Restrictions: WBAT      Mobility  Bed Mobility Overal bed mobility: Needs Assistance Bed Mobility: Supine to Sit     Supine to sit: Min assist;Mod assist Sit to supine: Mod assist   General bed mobility comments: cues for sequence and use of R LE to self assist  Transfers Overall transfer level: Needs assistance Equipment used: Rolling walker (2 wheeled) Transfers: Sit to/from Stand Sit to Stand: Min assist         General transfer comment: cues for correct hand placement/technique  Ambulation/Gait Ambulation/Gait assistance: Min assist;Mod assist Ambulation Distance (Feet): 12 Feet Assistive device: Rolling walker (2 wheeled) Gait Pattern/deviations: Step-to pattern;Decreased step length - right;Decreased step length - left;Shuffle;Trunk flexed Gait velocity: decr Gait velocity interpretation: Below normal speed for age/gender General Gait Details: cues for sequence, posture and position from RW.  Distance limited by c/o dizziness - BP 128/65  Stairs            Wheelchair Mobility     Modified Rankin (Stroke Patients Only)       Balance Overall balance assessment: Needs assistance Sitting-balance support: No upper extremity supported;Feet supported Sitting balance-Leahy Scale: Good     Standing balance support: Bilateral upper extremity supported;During functional activity Standing balance-Leahy Scale: Poor                               Pertinent Vitals/Pain Pain Assessment: 0-10 Pain Score: 4  Pain Location: L knee Pain Descriptors / Indicators: Aching;Sore Pain Intervention(s): Limited activity within patient's tolerance;Monitored during session;Premedicated before session;Ice applied    Home Living Family/patient expects to be discharged to:: Skilled nursing facility Living Arrangements: Alone                    Prior Function Level of Independence: Independent      ADL's / Homemaking Assistance Needed: independent with ADLs adn light home mgt        Hand Dominance   Dominant Hand: Right    Extremity/Trunk Assessment   Upper Extremity Assessment Upper Extremity Assessment: Overall WFL for tasks assessed    Lower Extremity Assessment Lower Extremity Assessment: LLE deficits/detail LLE Deficits / Details: Quad strength 2/5 with AAROM at knee -10 - 40    Cervical / Trunk Assessment Cervical / Trunk Assessment: Normal  Communication   Communication: No difficulties  Cognition Arousal/Alertness: Awake/alert Behavior During Therapy: WFL for tasks assessed/performed Overall Cognitive Status: Within Functional Limits for tasks assessed                      General Comments  Exercises Total Joint Exercises Ankle Circles/Pumps: AROM;Both;15 reps;Supine Quad Sets: AROM;Both;10 reps;Supine Heel Slides: AAROM;Left;15 reps;Supine Straight Leg Raises: AAROM;Left;10 reps;Supine   Assessment/Plan    PT Assessment Patient needs continued PT services  PT Problem List Decreased strength;Decreased range of  motion;Decreased activity tolerance;Decreased balance;Decreased mobility;Decreased knowledge of use of DME;Pain;Obesity          PT Treatment Interventions DME instruction;Gait training;Functional mobility training;Therapeutic activities;Therapeutic exercise;Patient/family education    PT Goals (Current goals can be found in the Care Plan section)  Acute Rehab PT Goals Patient Stated Goal: go to rehab and go home PT Goal Formulation: With patient Time For Goal Achievement: 04/10/16 Potential to Achieve Goals: Good    Frequency 7X/week   Barriers to discharge        Co-evaluation               End of Session Equipment Utilized During Treatment: Gait belt;Left knee immobilizer Activity Tolerance: Patient tolerated treatment well Patient left: in chair;with call bell/phone within reach;with chair alarm set Nurse Communication: Mobility status         Time: [id number] PT Time Calculation (min) (ACUTE ONLY): 30 min   Charges:   PT Evaluation $PT Eval Low Complexity: 1 Procedure PT Treatments $Therapeutic Exercise: 8-22 mins   PT G Codes:        [name] 2016-04-08, 12:54 PM

## 2016-04-05 NOTE — Progress Notes (Signed)
CSW is assisting with d/c planning. PT has recommended SNF at d/c. SNF search has been initiated and pr has chosen [facility] for rehab. SNF has requested admission / authorization from [employer]. A decision is pending. CSW will continue to follow to assist with d/c planning.  Werner Lean LCSW [phone]

## 2016-04-05 NOTE — Progress Notes (Signed)
Physical Therapy Treatment Patient Details Name: [name] MRN: [id number] DOB: 1940-07-07 Today's Date: 04/05/2016    History of Present Illness Left total knee arthroplasty    PT Comments    Pt cooperative but ltd this pm by pain/fatigue  Follow Up Recommendations  SNF     Equipment Recommendations  Rolling walker with 5" wheels    Recommendations for Other Services OT consult     Precautions / Restrictions Precautions Precautions: Knee Precaution Comments: educated on no pillow under knee Required Braces or Orthoses: Knee Immobilizer - Left Knee Immobilizer - Left: Discontinue once straight leg raise with < 10 degree lag Restrictions Weight Bearing Restrictions: No Other Position/Activity Restrictions: WBAT    Mobility  Bed Mobility Overal bed mobility: Needs Assistance Bed Mobility: Supine to Sit;Sit to Supine     Supine to sit: Min assist Sit to supine: Min assist   General bed mobility comments: cues for sequence and use of R LE to self assist  Transfers Overall transfer level: Needs assistance Equipment used: Rolling walker (2 wheeled) Transfers: Sit to/from Stand Sit to Stand: Min assist         General transfer comment: cues for correct hand placement/technique  Ambulation/Gait Ambulation/Gait assistance: Min assist Ambulation Distance (Feet): 20 Feet (and 15') Assistive device: Rolling walker (2 wheeled) Gait Pattern/deviations: Step-to pattern;Decreased step length - right;Decreased step length - left;Shuffle;Trunk flexed Gait velocity: decr Gait velocity interpretation: Below normal speed for age/gender General Gait Details: cues for sequence, posture and position from RW.    Stairs            Wheelchair Mobility    Modified Rankin (Stroke Patients Only)       Balance Overall balance assessment: Needs assistance Sitting-balance support: No upper extremity supported;Feet supported Sitting balance-Leahy Scale: Good      Standing balance support: Bilateral upper extremity supported;During functional activity Standing balance-Leahy Scale: Poor                      Cognition Arousal/Alertness: Awake/alert Behavior During Therapy: WFL for tasks assessed/performed Overall Cognitive Status: Within Functional Limits for tasks assessed                      Exercises Total Joint Exercises Ankle Circles/Pumps: AROM;Both;15 reps;Supine Quad Sets: AROM;Both;10 reps;Supine Heel Slides: AAROM;Left;15 reps;Supine Straight Leg Raises: AAROM;Left;10 reps;Supine    General Comments        Pertinent Vitals/Pain Pain Assessment: 0-10 Pain Score: 6  Pain Location: L knee Pain Descriptors / Indicators: Aching;Sore Pain Intervention(s): Limited activity within patient's tolerance;Monitored during session;Premedicated before session;Ice applied    Home Living Family/patient expects to be discharged to:: Skilled nursing facility Living Arrangements: Alone                  Prior Function Level of Independence: Independent    ADL's / Homemaking Assistance Needed: independent with ADLs adn light home mgt     PT Goals (current goals can now be found in the care plan section) Acute Rehab PT Goals Patient Stated Goal: go to rehab and go home PT Goal Formulation: With patient Time For Goal Achievement: 04/10/16 Potential to Achieve Goals: Good Progress towards PT goals: Progressing toward goals    Frequency    7X/week      PT Plan Current plan remains appropriate    Co-evaluation             End of Session Equipment Utilized During Treatment:  Gait belt;Left knee immobilizer Activity Tolerance: Patient tolerated treatment well;Patient limited by pain Patient left: in bed;with call bell/phone within reach     Time: 1440-1505 PT Time Calculation (min) (ACUTE ONLY): 25 min  Charges:  $Gait Training: 23-37 mins $Therapeutic Exercise: 8-22 mins                    G  Codes:      Remmy Crass 04/26/16, 3:35 PM

## 2016-04-05 NOTE — Evaluation (Signed)
Occupational Therapy Evaluation Patient Details Name: EVEE ABUNDIZ MRN: [id number] DOB: 03/13/1941 Today's Date: 04/05/2016    History of Present Illness Left total knee arthroplasty   Clinical Impression   Pt with decline in function and safety with ADLs and ADL mobility. Pt requires assist with LB ADLs and lives at home alone. Pt planning to d/c to SNF and SW coming ion to discuss placement venues options with pt. All education competed and no further acute OT indicated at this time. Will defer any further OT intervention to SNF    Follow Up Recommendations  SNF    Equipment Recommendations  None recommended by OT (TBD at next venue of care)    Recommendations for Other Services       Precautions / Restrictions Precautions Precautions: Knee Precaution Comments: educated on no pillow under knee Required Braces or Orthoses: Knee Immobilizer - Left Restrictions Weight Bearing Restrictions: No      Mobility Bed Mobility Overal bed mobility: Needs Assistance Bed Mobility: Sit to Supine       Sit to supine: Mod assist   General bed mobility comments: pt up in recliner upon arrival, assisted back to bed with mod A for L LE back onto bed  Transfers Overall transfer level: Needs assistance Equipment used: Rolling walker (2 wheeled) Transfers: Sit to/from Stand Sit to Stand: Min assist         General transfer comment: cues for correct hand placement/technique    Balance Overall balance assessment: Needs assistance Sitting-balance support: No upper extremity supported;Feet supported Sitting balance-Leahy Scale: Good     Standing balance support: Bilateral upper extremity supported;During functional activity Standing balance-Leahy Scale: Poor                              ADL Overall ADL's : Needs assistance/impaired     Grooming: Wash/dry hands;Wash/dry face;Min guard;Standing   Upper Body Bathing: Supervision/ safety;Set up;Sitting    Lower Body Bathing: Moderate assistance (simulated)   Upper Body Dressing : Set up;Supervision/safety;Sitting   Lower Body Dressing: Moderate assistance (simulated)   Toilet Transfer: Minimal assistance;RW;Ambulation;Comfort height toilet;Grab bars;Cueing for safety;Cueing for sequencing   Toileting- Clothing Manipulation and Hygiene: Sit to/from stand;Moderate assistance       Functional mobility during ADLs: Minimal assistance;Cueing for safety;Cueing for sequencing;Rolling walker       Vision Vision Assessment?: No apparent visual deficits              Pertinent Vitals/Pain Pain Assessment: 0-10 Pain Score: 4  Pain Location: L knee Pain Descriptors / Indicators: Aching;Sore Pain Intervention(s): Limited activity within patient's tolerance;Monitored during session;Premedicated before session;Ice applied;Repositioned     Hand Dominance Right   Extremity/Trunk Assessment Upper Extremity Assessment Upper Extremity Assessment: Generalized weakness   Lower Extremity Assessment Lower Extremity Assessment: Defer to PT evaluation   Cervical / Trunk Assessment Cervical / Trunk Assessment: Normal   Communication Communication Communication: No difficulties   Cognition Arousal/Alertness: Awake/alert Behavior During Therapy: WFL for tasks assessed/performed Overall Cognitive Status: Within Functional Limits for tasks assessed                     General Comments   pt very pleasant and cooperative                 Home Living Family/patient expects to be discharged to:: Skilled nursing facility Living Arrangements: Alone  Prior Functioning/Environment Level of Independence: Independent    ADL's / Homemaking Assistance Needed: independent with ADLs adn light home mgt            OT Problem List: Decreased activity tolerance;Impaired balance (sitting and/or standing);Pain;Decreased knowledge of  use of DME or AE;Obesity   OT Treatment/Interventions:      OT Goals(Current goals can be found in the care plan section) Acute Rehab OT Goals Patient Stated Goal: go to rehab and go home OT Goal Formulation: With patient  OT Frequency:     Barriers to D/C: Decreased caregiver support                        End of Session Equipment Utilized During Treatment: Gait belt;Rolling walker;Other (comment) (3 in 1)  Activity Tolerance: Patient tolerated treatment well Patient left: in bed;with call bell/phone within reach;with nursing/sitter in room (informed RN that bed alarm malfunctioning)   Time: [id number] OT Time Calculation (min): 25 min Charges:  OT General Charges $OT Visit: 1 Procedure OT Evaluation $OT Eval Moderate Complexity: 1 Procedure OT Treatments $Therapeutic Activity: 8-22 mins G-Codes:    Britt Bottom 04/05/2016, 12:07 PM

## 2016-04-05 NOTE — Op Note (Signed)
NAME:  [name], [name]:  MEDICAL RECORD NO.:  [id number]  LOCATION:                                 FACILITY:  PHYSICIAN:  Susa Day, M.D.    DATE OF BIRTH:  12-23-1940  DATE OF PROCEDURE:  04/04/2016 DATE OF DISCHARGE:                              OPERATIVE REPORT   PREOPERATIVE DIAGNOSES:  Degenerative joint disease, end-stage osteoarthritis, left knee.  POSTOPERATIVE DIAGNOSES:  Degenerative joint disease, end-stage osteoarthritis, left knee.  PROCEDURE PERFORMED:  Left total knee arthroplasty utilizing DePuy Attune 4 femur, 4 tibia, 6 mm insert, 35 patella.  ANESTHESIA:  Spinal.  ASSISTANT:  Cleophas Dunker, PA.  HISTORY:  75, bone-on-bone arthrosis, medial compartment, left knee, refractory to conservative treatment, including corticosteroid injections, viscosupplementation, activity modification, bone-on-bone arthrosis by x-rays in the medial compartment, indicated for replacement of the degenerated joint, and significant negative effect to her activities of daily living.  Risks and benefits discussed including bleeding, infection, damage to neurovascular structures, suboptimal range of motion, DVT, PE, anesthetic complications, etc.  TECHNIQUE:  With the patient in a supine position, after induction of adequate anesthesia, 2 g Kefzol, left lower extremity was prepped and draped in usual sterile fashion.  The left leg was exsanguinated, thigh tourniquet to 275.  Midline incision was made over the knee.  Full- thickness flaps developed.  Ample adipose subcutaneous tissue was noted. Median parapatellar arthrotomy performed.  Patella everted.  Elevated the soft tissues medially.  Knee flexed.  Tricompartmental osteoarthrosis, bone-on-bone medial compartment was noted.  We removed osteophytes off the medial tibial plateau, femoral condyle, some of the patella.  We removed the meniscus remnants and the ACL.  Step drill was utilized to  enter the femoral canal.  It was irrigated, a 5-degree left was chosen with 10 off the distal femur due to the slight flexion contracture.  This was then pinned and distal femoral cut was then performed.  I then measured the femur off the anterior cortex referencing it with 3 degrees of external rotation to a 4.  This was then pinned.  We performed our cut after the block was applied, anterior, posterior, and chamfer cuts with soft tissues protected posteriorly at all times.  We then subluxed the tibia.  External alignment guide too from the defect, which was medially in line with the 1st ray bisecting the tibiotalar joint, 3 degrees slope parallel to the shaft.  This was then pinned, performed our cut with an oscillating saw protecting the soft tissues posterior and laterally.  We then used a bone harvest from the proximal tibia to place into the notch.  We trialed the femur to a 4 maximally covering it just at medial aspect of tibial tubercle, it was pinned, drilled, and a punch guide was then applied.  We then turned our attention back towards the femur.  We performed our box cut after measuring it to a 4 and lateralizing it. The box cut was then performed.  Then, we checked posteriorly, the popliteus was intact.  Large osteophyte loose body was removed, 2 cm in diameter.  We meticulously removed from posterior medially again protecting soft tissues meticulously at  all times.  The box cut was then performed.  Trial femur was placed and drilled our lug holes.  We placed a 6 mm insert, we reduced the knee, and had full extension, full flexion, good stability, varus and valgus stressing 0 to 30 degrees. Negative anterior drawer.  We then turned our attention to the patella, it was measured at 25, planed to a 15, utilizing external alignment guide.  Sized it to a 35, drilled our PEG holes medializing those. Placed a trial patella, reduced it, and had excellent patellofemoral tracking.   Trials were then removed.  We checked posteriorly, popliteus was intact.  Capsule was intact.  No loose bodies or remnants. Cauterized the geniculates.  Copiously irrigated with pulsatile lavage. Flexed the knee, all surfaces thoroughly dried.  Tibia subluxed.  Mixed cement on the back table in appropriate fashion and injected into the tibia digitally pressurizing it, used a 4 tibial tray, impacted it, redundant cement removed.  We cemented the femur standard, redundant cement removed.  Placed a 6 insert trial, reduced it, held an axial load throughout the curing of the cement.  We cemented the patellar component as well and clamped it.  After curing of the cement, when the tourniquet down at 59 minutes, cauterized any active bleeding.  I then flexed the knee and meticulously removed all redundant cement.  Copiously irrigated the wound.  Bone wax placed on the cancellous surfaces.  After copiously irrigating with antibiotic irrigation, we placed a permanent 6 insert, reduced it, again had full extension, full flexion, good stability to varus and valgus stressing 0 to 30 degrees.  Negative anterior drawer. Excellent patellofemoral tracking.  Wound was copiously irrigated once again.  I closed the patellar arthrotomy with #1 Vicryl interrupted figure-of-eight sutures, oversewn with a running STRATAFIX.  Flexed the knee, full flexion to 120 degrees, had flexion to gravity, subcu with multiple 2-0 and skin with staples. She had some attenuated subcutaneous tissue.  Sterile dressing applied. Placed in an immobilizer and transported to the recovery room in a satisfactory condition.  The patient tolerated the procedure well.  No complications.  Assistant, Cleophas Dunker, PA was used throughout the case for patient positioning, gentle traction, closure, and exposure.     Susa Day, M.D.     Geralynn Rile  D:  04/04/2016  T:  04/04/2016  Job:  [id number]

## 2016-04-05 NOTE — NC FL2 (Signed)
[place] LEVEL OF CARE SCREENING TOOL     IDENTIFICATION  Patient Name: [name] Birthdate: 1940-12-20 Sex: female Admission Date (Current Location): 04/04/2016  [facility] and [place] Number:  Herbalist and Address:  [facility],  [place], [place]      Provider Number: [id number]  Attending Physician Name and Address:  Susa Day, MD  Relative Name and Phone Number:       Current Level of Care: Hospital Recommended Level of Care: [place] Prior Approval Number:    Date Approved/Denied:   PASRR Number: [id number]  Discharge Plan: SNF    Current Diagnoses: Patient Active Problem List   Diagnosis Date Noted  . Primary osteoarthritis of left knee 04/04/2016  . 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 Asthma 12/07/2011  . Arthritis 12/07/2011  . 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 Obesity  12/07/2011  . DIVERTICULOSIS-COLON 12/01/2008  . ABDOMINAL PAIN-RLQ 12/01/2008    Orientation RESPIRATION BLADDER Height & Weight     Self, Time, Situation, Place  Normal Continent Weight: 214 lb (97.1 kg) Height:  5' 2.5" (158.8 cm)  BEHAVIORAL SYMPTOMS/MOOD NEUROLOGICAL BOWEL NUTRITION STATUS  Other (Comment)   Continent Diet  AMBULATORY STATUS COMMUNICATION OF NEEDS Skin   Limited Assist Verbally Surgical wounds                       Personal Care Assistance Level of Assistance  Bathing, Feeding, Dressing Bathing Assistance: Limited assistance Feeding assistance: Independent Dressing Assistance: Limited assistance     Functional Limitations Info  Sight, Hearing, Speech Sight Info: Adequate Hearing Info: Adequate Speech Info: Adequate    SPECIAL CARE FACTORS FREQUENCY  PT (By licensed PT), OT (By licensed OT)     PT Frequency: 5x wk OT Frequency: 5x wk            Contractures Contractures Info: Not present    Additional Factors Info  Code Status Code  Status Info: Full Code             Current Medications (04/05/2016):  This is the current hospital active medication list Current Facility-Administered Medications  Medication Dose Route Frequency Provider Last Rate Last Dose  . acetaminophen (TYLENOL) tablet 650 mg  650 mg Oral Q6H PRN Susa Day, MD   650 mg at 04/04/16 1940   Or  . acetaminophen (TYLENOL) suppository 650 mg  650 mg Rectal Q6H PRN Susa Day, MD      . acidophilus (RISAQUAD) capsule 1 capsule  1 capsule Oral Daily Susa Day, MD   1 capsule at 04/05/16 0754  . albuterol (PROVENTIL) (2.5 MG/3ML) 0.083% nebulizer solution 3 mL  3 mL Inhalation Q6H PRN Susa Day, MD      . alum & mag hydroxide-simeth (MAALOX/MYLANTA) 200-200-20 MG/5ML suspension 30 mL  30 mL Oral Q4H PRN Susa Day, MD      . aspirin EC tablet 325 mg  325 mg Oral BID PC Susa Day, MD   325 mg at 04/05/16 0753  . bisacodyl (DULCOLAX) EC tablet 5 mg  5 mg Oral Daily PRN Susa Day, MD      . dextrose 5 % and 0.45 % NaCl with KCl 20 mEq/L infusion   Intravenous Continuous Susa Day, MD 50 mL/hr at 04/04/16 1532 50 mL/hr at 04/04/16 1532  . diphenhydrAMINE (BENADRYL) 12.5 MG/5ML elixir 12.5-25 mg  12.5-25 mg Oral Q4H PRN Susa Day, MD      .  docusate sodium (COLACE) capsule 100 mg  100 mg Oral BID Susa Day, MD   100 mg at 04/05/16 0753  . HYDROmorphone (DILAUDID) injection 0.5 mg  0.5 mg Intravenous Q2H PRN Susa Day, MD   0.5 mg at 04/04/16 1533  . loratadine (CLARITIN) tablet 10 mg  10 mg Oral Daily Susa Day, MD   10 mg at 04/05/16 0753  . LORazepam (ATIVAN) tablet 2 mg  2 mg Oral QHS Susa Day, MD   2 mg at 04/04/16 2258  . magnesium citrate solution 1 Bottle  1 Bottle Oral Once PRN Susa Day, MD      . menthol-cetylpyridinium (CEPACOL) lozenge 3 mg  1 lozenge Oral PRN Susa Day, MD       Or  . phenol (CHLORASEPTIC) mouth spray 1 spray  1 spray Mouth/Throat PRN Susa Day, MD      . methocarbamol  (ROBAXIN) tablet 500 mg  500 mg Oral Q6H PRN Susa Day, MD   500 mg at 04/05/16 0753   Or  . methocarbamol (ROBAXIN) 500 mg in dextrose 5 % 50 mL IVPB  500 mg Intravenous Q6H PRN Susa Day, MD   500 mg at 04/04/16 1101  . metoCLOPramide (REGLAN) tablet 5-10 mg  5-10 mg Oral Q8H PRN Susa Day, MD       Or  . metoCLOPramide (REGLAN) injection 5-10 mg  5-10 mg Intravenous Q8H PRN Susa Day, MD      . ondansetron [facility]) tablet 4 mg  4 mg Oral Q6H PRN Susa Day, MD       Or  . ondansetron [facility]) injection 4 mg  4 mg Intravenous Q6H PRN Susa Day, MD      . oxyCODONE (Oxy IR/ROXICODONE) immediate release tablet 5-10 mg  5-10 mg Oral Q3H PRN Susa Day, MD   10 mg at 04/05/16 0752  . pantoprazole (PROTONIX) EC tablet 40 mg  40 mg Oral Daily Susa Day, MD   40 mg at 04/05/16 0753  . perphenazine (TRILAFON) tablet 6 mg  6 mg Oral QHS Susa Day, MD   6 mg at 04/04/16 2258  . polyethylene glycol (MIRALAX / GLYCOLAX) packet 17 g  17 g Oral Daily PRN Susa Day, MD      . polyvinyl alcohol (LIQUIFILM TEARS) 1.4 % ophthalmic solution 1 drop  1 drop Both Eyes PRN Susa Day, MD      . traMADol Veat[facility]bon) tablet 50 mg  50 mg Oral Q6H PRN Susa Day, MD   50 mg at 04/05/16 0535     Discharge Medications: Please see discharge summary for a list of discharge medications.  Relevant Imaging Results:  Relevant Lab Results:   Additional Information ss # [id number]  [place] Spinnato, Randall An, LCSW

## 2016-04-05 NOTE — Progress Notes (Signed)
Patient ID: [name], female   DOB: 04/21/1940, 75 y.o.   MRN: [id number] Subjective: 1 Day Post-Op Procedure(s) (LRB): LEFT TOTAL KNEE ARTHROPLASTY (Left) Patient reports pain as moderate.    Patient has complaints of knee pain. Otherwise no c/o.   We will start therapy today. Plan is to go short term rehab stay then home with HHPT after hospital stay.  Objective: Vital signs in last 24 hours: Temp:  [97.5 F (36.4 C)-98.4 F (36.9 C)] 97.8 F (36.6 C) (12/28 0540) Pulse Rate:  [70-82] 77 (12/28 0540) Resp:  [7-18] 17 (12/28 0540) BP: (124-164)/(61-91) 148/64 (12/28 0540) SpO2:  [92 %-98 %] 95 % (12/28 0540)  Intake/Output from previous day:  Intake/Output Summary (Last 24 hours) at 04/05/16 0853 Last data filed at 04/05/16 0700  Gross per 24 hour  Intake             1526 ml  Output             2970 ml  Net            -1444 ml    Intake/Output this shift: No intake/output data recorded.  Labs: Results for orders placed or performed during the hospital encounter of 04/04/16  CBC  Result Value Ref Range   WBC 14.9 (H) 4.0 - 10.5 K/uL   RBC 4.35 3.87 - 5.11 MIL/uL   Hemoglobin 13.1 12.0 - 15.0 g/dL   HCT 39.1 36.0 - 46.0 %   MCV 89.9 78.0 - 100.0 fL   MCH 30.1 26.0 - 34.0 pg   MCHC 33.5 30.0 - 36.0 g/dL   RDW 12.7 11.5 - 15.5 %   Platelets 251 150 - 400 K/uL  Basic metabolic panel  Result Value Ref Range   Sodium 136 135 - 145 mmol/L   Potassium 4.2 3.5 - 5.1 mmol/L   Chloride 99 (L) 101 - 111 mmol/L   CO2 28 22 - 32 mmol/L   Glucose, Bld 116 (H) 65 - 99 mg/dL   BUN 15 6 - 20 mg/dL   Creatinine, Ser 1.07 (H) 0.44 - 1.00 mg/dL   Calcium 8.8 (L) 8.9 - 10.3 mg/dL   GFR calc non Af Amer 50 (L) >60 mL/min   GFR calc Af Amer 58 (L) >60 mL/min   Anion gap 9 5 - 15    Exam - Neurologically intact ABD soft Neurovascular intact Sensation intact distally Intact pulses distally Dorsiflexion/Plantar flexion intact Incision: dressing C/D/I and no drainage No  cellulitis present Dressing - clean, dry, no drainage Motor function intact - moving foot and toes well on exam.  Compartments soft  Assessment/Plan: 1 Day Post-Op Procedure(s) (LRB): LEFT TOTAL KNEE ARTHROPLASTY (Left)  Advance diet Up with therapy D/C IV fluids Past Medical History:  Diagnosis Date  . Anemia 1990  . Anxiety   . Arthritis 2004  . Asthma 1995  . Colon polyp   . Complication of anesthesia 1966   first surgery she had- had a hard time waking up from surgery  . Diverticulosis 2003  . GERD (gastroesophageal reflux disease)     DVT Prophylaxis - ASA 325mg  BID Protocol Weight-Bearing as tolerated to L leg No vaccines. Plan short term rehab stay Seen by myself and Dr Mliss Fritz, [place] Rolls. 04/05/2016, 8:53 AM

## 2016-04-06 LAB — CBC
HCT: 37.2 % (ref 36.0–46.0)
HEMOGLOBIN: 12.7 g/dL (ref 12.0–15.0)
MCH: 30.2 pg (ref 26.0–34.0)
MCHC: 34.1 g/dL (ref 30.0–36.0)
MCV: 88.6 fL (ref 78.0–100.0)
Platelets: 212 10*3/uL (ref 150–400)
RBC: 4.2 MIL/uL (ref 3.87–5.11)
RDW: 12.9 % (ref 11.5–15.5)
WBC: 13.9 10*3/uL — ABNORMAL HIGH (ref 4.0–10.5)

## 2016-04-06 NOTE — Progress Notes (Signed)
Pt discharging to SNF, CSW following. 

## 2016-04-06 NOTE — Progress Notes (Signed)
Physical Therapy Treatment Patient Details Name: [name] MRN: [id number] DOB: 10-27-1940 Today's Date: 04/06/2016    History of Present Illness Left total knee arthroplasty    PT Comments    Pt motivated and progressing with mobility.  Follow Up Recommendations  SNF     Equipment Recommendations  Rolling walker with 5" wheels    Recommendations for Other Services OT consult     Precautions / Restrictions Precautions Precautions: Knee Precaution Comments: educated on no pillow under knee Required Braces or Orthoses: Knee Immobilizer - Left Knee Immobilizer - Left: Discontinue once straight leg raise with < 10 degree lag Restrictions Weight Bearing Restrictions: No Other Position/Activity Restrictions: WBAT    Mobility  Bed Mobility Overal bed mobility: Needs Assistance Bed Mobility: Sit to Supine       Sit to supine: Min assist   General bed mobility comments: cues for sequence and use of R LE to self assist  Transfers Overall transfer level: Needs assistance Equipment used: Rolling walker (2 wheeled) Transfers: Sit to/from Stand Sit to Stand: Min assist         General transfer comment: cues for correct hand placement/technique  Ambulation/Gait Ambulation/Gait assistance: Min assist;Min guard Ambulation Distance (Feet): 123 Feet Assistive device: Rolling walker (2 wheeled) Gait Pattern/deviations: Step-to pattern;Decreased step length - right;Decreased step length - left;Shuffle;Trunk flexed Gait velocity: decr Gait velocity interpretation: Below normal speed for age/gender General Gait Details: cues for sequence, posture and position from RW.    Stairs            Wheelchair Mobility    Modified Rankin (Stroke Patients Only)       Balance                                    Cognition Arousal/Alertness: Awake/alert Behavior During Therapy: WFL for tasks assessed/performed Overall Cognitive Status: Within  Functional Limits for tasks assessed                      Exercises Total Joint Exercises Ankle Circles/Pumps: AROM;Both;15 reps;Supine Quad Sets: AROM;Both;Supine;15 reps Heel Slides: AAROM;Left;15 reps;Supine Straight Leg Raises: AAROM;Left;Supine;20 reps    General Comments        Pertinent Vitals/Pain Pain Assessment: 0-10 Pain Score: 5  Pain Location: L knee Pain Descriptors / Indicators: Aching;Sore Pain Intervention(s): Limited activity within patient's tolerance;Monitored during session;Premedicated before session;Ice applied    Home Living                      Prior Function            PT Goals (current goals can now be found in the care plan section) Acute Rehab PT Goals Patient Stated Goal: go to rehab and go home PT Goal Formulation: With patient Time For Goal Achievement: 04/10/16 Potential to Achieve Goals: Good Progress towards PT goals: Progressing toward goals    Frequency    7X/week      PT Plan Current plan remains appropriate    Co-evaluation             End of Session Equipment Utilized During Treatment: Gait belt;Left knee immobilizer Activity Tolerance: Patient tolerated treatment well Patient left: in bed;with call bell/phone within reach     Time: 0957-1030 PT Time Calculation (min) (ACUTE ONLY): 33 min  Charges:  $Gait Training: 8-22 mins $Therapeutic Exercise: 8-22 mins  G Codes:      [name] 2016-05-01, 12:52 PM

## 2016-04-06 NOTE — Clinical Social Work Placement (Signed)
   CLINICAL SOCIAL WORK PLACEMENT  NOTE  Date:  04/06/2016  Patient Details  Name: [name] MRN: [id number] Date of Birth: 02/09/1941  Clinical Social Work is seeking post-discharge placement for this patient at the [place] level of care (*CSW will initial, date and re-position this form in  chart as items are completed):  Yes   Patient/family provided with [place] Work Department's list of facilities offering this level of care within the geographic area requested by the patient (or if unable, by the patient's family).  Yes   Patient/family informed of their freedom to choose among providers that offer the needed level of care, that participate in Medicare, Medicaid or managed care program needed by the patient, have an available bed and are willing to accept the patient.  Yes   Patient/family informed of 's ownership interest in [facility] and [facility], as well as of the fact that they are under no obligation to receive care at these facilities.  PASRR submitted to EDS on 04/05/16     PASRR number received on 04/05/16     Existing PASRR number confirmed on       FL2 transmitted to all facilities in geographic area requested by pt/family on 04/05/16     FL2 transmitted to all facilities within larger geographic area on       Patient informed that his/her managed care company has contracts with or will negotiate with certain facilities, including the following:        Yes   Patient/family informed of bed offers received.  Patient chooses bed at Home recommends and patient chooses bed at      Patient to be transferred [facility] on 04/06/16.  Patient to be transferred to facility by PT[facility]     Patient family notified on 04/06/16 of transfer.  Name of family member notified:  Niece     PHYSICIAN       Additional Comment: Pt / niece are in agreement  with d/c to [facility] today. [facility] transport is required. Medical necessity form completed. Pt is aware out of pocket costs may be associated with [facility] transport. D/C Summary sent to SNF for review. Scripts included in d/c packet. # for report provided to nsg.   _______________________________________________ Luretha Rued, LCSW  [phone] 04/06/2016, 2:38 PM

## 2016-04-06 NOTE — Progress Notes (Signed)
Patient ID: [name], female   DOB: 1940-09-12, 75 y.o.   MRN: [id number] Subjective: 2 Days Post-Op Procedure(s) (LRB): LEFT TOTAL KNEE ARTHROPLASTY (Left) Patient reports pain as mild.    Patient has complaints of L knee pain, improving, well controlled. Feels she may be able to leave today. Pending auth for [facility]. No other c/o.  Objective: Vital signs in last 24 hours: Temp:  [97.8 F (36.6 C)-98.2 F (36.8 C)] 98.2 F (36.8 C) (12/29 0458) Pulse Rate:  [73-100] 100 (12/29 0458) Resp:  [17-20] 20 (12/29 0458) BP: (129-167)/(62-84) 167/84 (12/29 0458) SpO2:  [92 %-96 %] 93 % (12/29 0458)  Intake/Output from previous day:  Intake/Output Summary (Last 24 hours) at 04/06/16 0806 Last data filed at 04/06/16 0458  Gross per 24 hour  Intake              755 ml  Output              950 ml  Net             -195 ml    Intake/Output this shift: No intake/output data recorded.  Labs: Results for orders placed or performed during the hospital encounter of 04/04/16  CBC  Result Value Ref Range   WBC 14.9 (H) 4.0 - 10.5 K/uL   RBC 4.35 3.87 - 5.11 MIL/uL   Hemoglobin 13.1 12.0 - 15.0 g/dL   HCT 39.1 36.0 - 46.0 %   MCV 89.9 78.0 - 100.0 fL   MCH 30.1 26.0 - 34.0 pg   MCHC 33.5 30.0 - 36.0 g/dL   RDW 12.7 11.5 - 15.5 %   Platelets 251 150 - 400 K/uL  Basic metabolic panel  Result Value Ref Range   Sodium 136 135 - 145 mmol/L   Potassium 4.2 3.5 - 5.1 mmol/L   Chloride 99 (L) 101 - 111 mmol/L   CO2 28 22 - 32 mmol/L   Glucose, Bld 116 (H) 65 - 99 mg/dL   BUN 15 6 - 20 mg/dL   Creatinine, Ser 1.07 (H) 0.44 - 1.00 mg/dL   Calcium 8.8 (L) 8.9 - 10.3 mg/dL   GFR calc non Af Amer 50 (L) >60 mL/min   GFR calc Af Amer 58 (L) >60 mL/min   Anion gap 9 5 - 15  CBC  Result Value Ref Range   WBC 13.9 (H) 4.0 - 10.5 K/uL   RBC 4.20 3.87 - 5.11 MIL/uL   Hemoglobin 12.7 12.0 - 15.0 g/dL   HCT 37.2 36.0 - 46.0 %   MCV 88.6 78.0 - 100.0 fL   MCH 30.2 26.0 - 34.0 pg   MCHC  34.1 30.0 - 36.0 g/dL   RDW 12.9 11.5 - 15.5 %   Platelets 212 150 - 400 K/uL    Exam - Neurologically intact ABD soft Neurovascular intact Sensation intact distally Intact pulses distally Dorsiflexion/Plantar flexion intact Incision: dressing C/D/I and scant drainage No cellulitis present Compartment soft no sign of DVT Dressing/Incision - scant dried bloody drainage on aquacel, distal3 Motor function intact - moving foot and toes well on exam.   Assessment/Plan: 2 Days Post-Op Procedure(s) (LRB): LEFT TOTAL KNEE ARTHROPLASTY (Left)  Advance diet Up with therapy D/C IV fluids Past Medical History:  Diagnosis Date  . Anemia 1990  . Anxiety   . Arthritis 2004  . Asthma 1995  . Colon polyp   . Complication of anesthesia 1966   first surgery she had- had a hard time waking up  from surgery  . Diverticulosis 2003  . GERD (gastroesophageal reflux disease)     DVT Prophylaxis - ASA 325mg  BID protocol Pending approval for [facility] Possible D/C today if approved otherwise tomorrow  Lacie Draft M. 04/06/2016, 8:06 AM

## 2016-04-06 NOTE — Progress Notes (Signed)
RN called report. All questions answered. All questions answered.   Paperwork and prescriptions sent with [facility] in packet.   [facility] transported patient to SNF. Left 6E at 1530.

## 2016-04-06 NOTE — Discharge Summary (Signed)
Physician Discharge Summary   Patient ID: [name] MRN: [id number] DOB/AGE: 1940/08/11 75 y.o.  Admit date: 04/04/2016 Discharge date: 04/06/2016  Primary Diagnosis: left knee primary osteoarthritis  Admission Diagnoses:  Past Medical History:  Diagnosis Date  . Anemia 1990  . Anxiety   . Arthritis 2004  . Asthma 1995  . Colon polyp   . Complication of anesthesia 1966   first surgery she had- had a hard time waking up from surgery  . Diverticulosis 2003  . GERD (gastroesophageal reflux disease)    Discharge Diagnoses:   Principal Problem:   Primary osteoarthritis of left knee Active Problems:   Left knee DJD  Estimated body mass index is 38.52 kg/m as calculated from the following:   Height as of this encounter: 5' 2.5" (1.588 m).   Weight as of this encounter: 97.1 kg (214 lb).  Procedure:  Procedure(s) (LRB): LEFT TOTAL KNEE ARTHROPLASTY (Left)   Consults: None  HPI: see H&P Laboratory Data: Admission on 04/04/2016  Component Date Value Ref Range Status  . WBC 04/05/2016 14.9* 4.0 - 10.5 K/uL Final  . RBC 04/05/2016 4.35  3.87 - 5.11 MIL/uL Final  . Hemoglobin 04/05/2016 13.1  12.0 - 15.0 g/dL Final  . HCT 04/05/2016 39.1  36.0 - 46.0 % Final  . MCV 04/05/2016 89.9  78.0 - 100.0 fL Final  . MCH 04/05/2016 30.1  26.0 - 34.0 pg Final  . MCHC 04/05/2016 33.5  30.0 - 36.0 g/dL Final  . RDW 04/05/2016 12.7  11.5 - 15.5 % Final  . Platelets 04/05/2016 251  150 - 400 K/uL Final  . Sodium 04/05/2016 136  135 - 145 mmol/L Final  . Potassium 04/05/2016 4.2  3.5 - 5.1 mmol/L Final  . Chloride 04/05/2016 99* 101 - 111 mmol/L Final  . CO2 04/05/2016 28  22 - 32 mmol/L Final  . Glucose, Bld 04/05/2016 116* 65 - 99 mg/dL Final  . BUN 04/05/2016 15  6 - 20 mg/dL Final  . Creatinine, Ser 04/05/2016 1.07* 0.44 - 1.00 mg/dL Final  . Calcium 04/05/2016 8.8* 8.9 - 10.3 mg/dL Final  . GFR calc non Af Amer 04/05/2016 50* >60 mL/min Final  . GFR calc Af Amer 04/05/2016  58* >60 mL/min Final   Comment: (NOTE) The eGFR has been calculated using the CKD EPI equation. This calculation has not been validated in all clinical situations. eGFR's persistently <60 mL/min signify possible Chronic Kidney Disease.   . Anion gap 04/05/2016 9  5 - 15 Final  . WBC 04/06/2016 13.9* 4.0 - 10.5 K/uL Final  . RBC 04/06/2016 4.20  3.87 - 5.11 MIL/uL Final  . Hemoglobin 04/06/2016 12.7  12.0 - 15.0 g/dL Final  . HCT 04/06/2016 37.2  36.0 - 46.0 % Final  . MCV 04/06/2016 88.6  78.0 - 100.0 fL Final  . MCH 04/06/2016 30.2  26.0 - 34.0 pg Final  . MCHC 04/06/2016 34.1  30.0 - 36.0 g/dL Final  . RDW 04/06/2016 12.9  11.5 - 15.5 % Final  . Platelets 04/06/2016 212  150 - 400 K/uL Final  Hospital Outpatient Visit on 03/28/2016  Component Date Value Ref Range Status  . aPTT 03/28/2016 30  24 - 36 seconds Final  . Sodium 03/28/2016 137  135 - 145 mmol/L Final  . Potassium 03/28/2016 4.0  3.5 - 5.1 mmol/L Final  . Chloride 03/28/2016 102  101 - 111 mmol/L Final  . CO2 03/28/2016 27  22 - 32 mmol/L Final  .  Glucose, Bld 03/28/2016 93  65 - 99 mg/dL Final  . BUN 03/28/2016 13  6 - 20 mg/dL Final  . Creatinine, Ser 03/28/2016 1.00  0.44 - 1.00 mg/dL Final  . Calcium 03/28/2016 9.0  8.9 - 10.3 mg/dL Final  . GFR calc non Af Amer 03/28/2016 54* >60 mL/min Final  . GFR calc Af Amer 03/28/2016 >60  >60 mL/min Final   Comment: (NOTE) The eGFR has been calculated using the CKD EPI equation. This calculation has not been validated in all clinical situations. eGFR's persistently <60 mL/min signify possible Chronic Kidney Disease.   . Anion gap 03/28/2016 8  5 - 15 Final  . WBC 03/28/2016 8.9  4.0 - 10.5 K/uL Final  . RBC 03/28/2016 4.95  3.87 - 5.11 MIL/uL Final  . Hemoglobin 03/28/2016 15.0  12.0 - 15.0 g/dL Final  . HCT 03/28/2016 43.7  36.0 - 46.0 % Final  . MCV 03/28/2016 88.3  78.0 - 100.0 fL Final  . MCH 03/28/2016 30.3  26.0 - 34.0 pg Final  . MCHC 03/28/2016 34.3  30.0 -  36.0 g/dL Final  . RDW 03/28/2016 12.7  11.5 - 15.5 % Final  . Platelets 03/28/2016 252  150 - 400 K/uL Final  . Prothrombin Time 03/28/2016 12.4  11.4 - 15.2 seconds Final  . INR 03/28/2016 0.92   Final  . ABO/RH(D) 04/04/2016 A POS   Final  . Antibody Screen 04/04/2016 NEG   Final  . Sample Expiration 04/04/2016 04/07/2016   Final  . Extend sample reason 04/04/2016 NO TRANSFUSIONS OR PREGNANCY IN THE PAST 3 MONTHS   Final  . MRSA, PCR 03/28/2016 NEGATIVE  NEGATIVE Final  . Staphylococcus aureus 03/28/2016 NEGATIVE  NEGATIVE Final   Comment:        The Xpert SA Assay (FDA approved for NASAL specimens in patients over 52 years of age), is one component of a comprehensive surveillance program.  Test performance has been validated by [facility] for patients greater than or equal to 67 year old. It is not intended to diagnose infection nor to guide or monitor treatment.   . Color, Urine 03/28/2016 YELLOW  YELLOW Final  . APPearance 03/28/2016 HAZY* CLEAR Final  . Specific Gravity, Urine 03/28/2016 1.014  1.005 - 1.030 Final  . pH 03/28/2016 6.0  5.0 - 8.0 Final  . Glucose, UA 03/28/2016 NEGATIVE  NEGATIVE mg/dL Final  . Hgb urine dipstick 03/28/2016 NEGATIVE  NEGATIVE Final  . Bilirubin Urine 03/28/2016 NEGATIVE  NEGATIVE Final  . Ketones, ur 03/28/2016 NEGATIVE  NEGATIVE mg/dL Final  . Protein, ur 03/28/2016 NEGATIVE  NEGATIVE mg/dL Final  . Nitrite 03/28/2016 NEGATIVE  NEGATIVE Final  . Leukocytes, UA 03/28/2016 LARGE* NEGATIVE Final  . RBC / HPF 03/28/2016 0-5  0 - 5 RBC/hpf Final  . WBC, UA 03/28/2016 0-5  0 - 5 WBC/hpf Final  . Bacteria, UA 03/28/2016 RARE* NONE SEEN Final  . Squamous Epithelial / LPF 03/28/2016 0-5* NONE SEEN Final  . Mucous 03/28/2016 PRESENT   Final     X-Rays:Dg Knee Left Port  Result Date: 04/04/2016 CLINICAL DATA:  Post left knee replacement EXAM: PORTABLE LEFT KNEE - 1-2 VIEW COMPARISON:  None. FINDINGS: Two views of the left knee submitted.  There is left knee prosthesis with anatomic alignment. Midline anterior skin staples are noted. Postsurgical changes with small amount of periarticular soft tissue air. IMPRESSION: Left knee prosthesis with anatomic alignment. Postsurgical changes are noted. Electronically Signed   By: Lahoma Crocker  M.D.   On: 04/04/2016 10:00    EKG: Orders placed or performed during the hospital encounter of 11/17/15  . ED EKG  . ED EKG  . EKG 12-Lead  . EKG 12-Lead  . EKG     Hospital Course: [name] is a 75 y.o. who was admitted to [facility]. They were brought to the operating room on 04/04/2016 and underwent Procedure(s): LEFT TOTAL KNEE ARTHROPLASTY.  Patient tolerated the procedure well and was later transferred to the recovery room and then to the orthopaedic floor for postoperative care.  They were given PO and IV analgesics for pain control following their surgery.  They were given 24 hours of postoperative antibiotics of  Anti-infectives    Start     Dose/Rate Route Frequency Ordered Stop   04/04/16 1400  ceFAZolin (ANCEF) IVPB 2g/100 mL premix     2 g 200 mL/hr over 30 Minutes Intravenous Every 6 hours 04/04/16 1321 04/05/16 0311   04/04/16 0759  polymyxin B 500,000 Units, bacitracin 50,000 Units in sodium chloride irrigation 0.9 % 500 mL irrigation  Status:  Discontinued       As needed 04/04/16 0802 04/04/16 0938   04/04/16 0600  ceFAZolin (ANCEF) IVPB 2g/100 mL premix     2 g 200 mL/hr over 30 Minutes Intravenous On call to O.R. 04/04/16 5449 04/04/16 0737     and started on DVT prophylaxis in the form of Aspirin, TED hose and SCDs.   PT and OT were ordered for total joint protocol.  Discharge planning consulted to help with postop disposition and equipment needs.  Patient had a good night on the evening of surgery.  They started to get up OOB with therapy on day one.  Continued to work with therapy into day two.  By day two, the patient had progressed with therapy and meeting  their goals.  Incision was healing well.  Patient was seen in rounds and was ready to go to rehab.   Diet: Regular diet Activity:WBAT Follow-up:in 10-14 days Disposition - Home Discharged Condition: good    Allergies as of 04/06/2016      Reactions   Haloperidol Anaphylaxis   Aripiprazole Other (See Comments)   unknown unknown      Medication List    STOP taking these medications   predniSONE 50 MG tablet Commonly known as:  DELTASONE   triamcinolone cream 0.5 % Commonly known as:  KENALOG     TAKE these medications   acetaminophen 500 MG tablet Commonly known as:  TYLENOL Take 1,000 mg by mouth every 6 (six) hours as needed for mild pain.   albuterol 108 (90 Base) MCG/ACT inhaler Commonly known as:  PROVENTIL HFA;VENTOLIN HFA Inhale into the lungs every 6 (six) hours as needed for wheezing or shortness of breath.   aspirin EC 325 MG tablet Take 1 tablet (325 mg total) by mouth 2 (two) times daily after a meal.   cetirizine 10 MG tablet Commonly known as:  ZYRTEC Take 1 tablet (10 mg total) by mouth daily. What changed:  when to take this  reasons to take this   docusate sodium 100 MG capsule Commonly known as:  COLACE Take 1 capsule (100 mg total) by mouth 2 (two) times daily as needed for mild constipation.   LORazepam 2 MG tablet Commonly known as:  ATIVAN Take 2 mg by mouth at bedtime.   oxyCODONE-acetaminophen 5-325 MG tablet Commonly known as:  PERCOCET Take 1-2 tablets by mouth every  4 (four) hours as needed for severe pain.   perphenazine 2 MG tablet Commonly known as:  TRILAFON Take 6 mg by mouth at bedtime.   polyethylene glycol packet Commonly known as:  MIRALAX / GLYCOLAX Take 17 g by mouth daily.   PRILOSEC PO Take 1 tablet by mouth daily as needed (heartburn).   SIMILASAN DRY EYE RELIEF OP Place 2 drops into both eyes 2 (two) times daily as needed (dryness).   traMADol 50 MG tablet Commonly known as:  ULTRAM Take 50 mg by  mouth daily as needed.      Follow-up Information    Johnn Hai, MD.   Specialty:  Orthopedic Surgery Contact information: [address] [place] [address] [phone]           Signed: Lacie Draft, PA-C Orthopaedic Surgery 04/06/2016, 8:08 AM

## 2016-04-10 ENCOUNTER — Non-Acute Institutional Stay: Payer: PRIVATE HEALTH INSURANCE | Admitting: Internal Medicine

## 2016-04-10 ENCOUNTER — Encounter: Payer: Self-pay | Admitting: Internal Medicine

## 2016-04-10 DIAGNOSIS — M1712 Unilateral primary osteoarthritis, left knee: Secondary | ICD-10-CM

## 2016-04-10 DIAGNOSIS — J4520 Mild intermittent asthma, uncomplicated: Secondary | ICD-10-CM

## 2016-04-10 NOTE — Assessment & Plan Note (Signed)
PT/OT at the SNF 

## 2016-04-10 NOTE — Assessment & Plan Note (Signed)
04/10/16 low-grade rhonchi without respiratory compromise  Prn nebulized DuoNeb

## 2016-04-10 NOTE — Patient Instructions (Signed)
See Current Assessment & Plan in Problem List under specific Diagnosis 

## 2016-04-10 NOTE — Progress Notes (Signed)
Facility Location: [facility]  Room Number: 216  Code Status: Full Code  PCP: Helane Rima, [place] [address]  This is a comprehensive admission note to [facility] performed on this date less than 30 days from date of admission. Included are preadmission medical/surgical history;reconciled medication list; family history; social history and comprehensive review of systems.  Corrections and additions to the records were documented . Comprehensive physical exam was also performed. Additionally a clinical summary was entered for each active diagnosis pertinent to this admission in the Problem List to enhance continuity of care.   HPI: The patient was hospitalized 12/27-12/29/17 for left total knee arthroplasty to treat end-stage primary osteoarthritis . Postoperatively she was given 24 hours of parenteral antibiotics. DVT prophylaxis consisted of aspirin orally, TED hose and SCDs. The patient progressed with PT/OT and was transferred to the SNF for continuation of same.   Past medical and surgical history:Include GERD, diverticulosis, colon polyp, asthma, anemia, and anxiety. She's had a left nephrectomy, cystectomy, colostomy, and appendectomy. She states that she had colon surgery for diverticulitis, not colon polyps. 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 The nephrectomy was in 2008. Colon polyp was removed in 2013.  Social history:Reviewed  Family history:Reviewed  Review of systems: The knee pain is improving. Her asthma is stable. She states that she uses Pro-Air approximately every 2 months. She describes snoring without associated apnea. She has intermittent abdominal pain which she relates to a mesh implant. Since surgery she's had some constipation in the context of the pain medicines. Dry mouth is a chronic issue. She  describes anxiety as a chronic, intermittent issue.  Constitutional: No fever,significant weight change, fatigue  Eyes: No redness, discharge, pain, vision change ENT/mouth: No nasal congestion,  purulent discharge, earache,change in hearing ,sore throat  Cardiovascular: No chest pain, palpitations,paroxysmal nocturnal dyspnea, claudication  Respiratory: No cough, sputum production,hemoptysis, DOE  Gastrointestinal: No heartburn,dysphagia, nausea / vomiting,rectal bleeding, melena Genitourinary: No dysuria,hematuria, pyuria,  incontinence, nocturia Dermatologic: No rash, pruritus, change in appearance of skin Neurologic: No dizziness,headache,syncope, seizures, numbness , tingling Endocrine: No change in hair/skin/ nails, excessive thirst, excessive hunger, excessive urination  Hematologic/lymphatic: No significant bruising, lymphadenopathy,abnormal bleeding Allergy/immunology: No itchy/ watery eyes, significant sneezing, urticaria, angioedema  Physical exam:  Pertinent or positive findings: Ptosis greater on the left than the right. There is minimal asymmetry of pupils,OS is minimally larger than the right. She has very mild, low-grade homogenous expiratory rhonchi. Abdomen is protuberant. Pedal pulses are good. She has trace edema at the sock line.  General appearance:Adequately nourished; no acute distress , increased work of breathing is present.   Lymphatic: No lymphadenopathy about the head, neck, axilla . Eyes: No conjunctival inflammation or lid edema is present. There is no scleral icterus. Ears:  External ear exam shows no significant lesions or deformities.   Nose:  External nasal examination shows no deformity or inflammation. Nasal mucosa are pink and moist without lesions ,exudates Oral exam: lips and gums are healthy appearing.There is no oropharyngeal erythema or exudate . Neck:  No thyromegaly, masses, tenderness noted.    Heart:  Normal rate and regular rhythm. S1 and S2  normal without gallop, murmur, click, rub .  Abdomen:Bowel sounds are normal. Abdomen is soft and nontender with no organomegaly, hernias,masses. GU: deferred  Extremities:  No cyanosis, clubbing Neurologic exam : Balance,Rhomberg,finger to nose testing could  not be completed due to clinical state Skin: Warm & dry w/o tenting. No significant lesions or rash.  See clinical summary under each active problem in the Problem List with associated updated therapeutic plan

## 2016-04-11 ENCOUNTER — Encounter: Payer: Self-pay | Admitting: Nurse Practitioner

## 2016-04-11 ENCOUNTER — Non-Acute Institutional Stay: Payer: PRIVATE HEALTH INSURANCE | Admitting: Nurse Practitioner

## 2016-04-11 DIAGNOSIS — F411 Generalized anxiety disorder: Secondary | ICD-10-CM | POA: Diagnosis not present

## 2016-04-11 DIAGNOSIS — K5900 Constipation, unspecified: Secondary | ICD-10-CM

## 2016-04-11 DIAGNOSIS — M1712 Unilateral primary osteoarthritis, left knee: Secondary | ICD-10-CM | POA: Diagnosis not present

## 2016-04-11 DIAGNOSIS — J4520 Mild intermittent asthma, uncomplicated: Secondary | ICD-10-CM | POA: Diagnosis not present

## 2016-04-11 NOTE — Progress Notes (Signed)
Nursing Home Location:  [facility]  Place of Service: SNF (31)  PCP: Helane Rima, MD  Allergies  Allergen Reactions  . Haloperidol Anaphylaxis  . Aripiprazole Other (See Comments)    unknown unknown    Chief Complaint  Patient presents with  . Discharge Note    HPI:  Patient is a 76 y.o. female seen today at [facility] for discharge home. Pt requesting discharge. The patient was hospitalized 12/27-12/29/17 for left total knee arthroplasty to treat end-stage primary osteoarthritis . Postoperatively she was given 24 hours of parenteral antibiotics. DVT prophylaxis consisted of aspirin orally, TED hose and SCDs. The patient progressed with PT/OT and was transferred to the SNF for continuation of same. Pt is now ready to transfer home with home health. Reports she lives alone with help from her husband (who does not live with her) and friends. Patient currently doing well with therapy, now stable to discharge home with home health.   Review of Systems:  Review of Systems  Constitutional: Negative for activity change, appetite change, fatigue and unexpected weight change.  HENT: Negative for congestion and hearing loss.   Eyes: Negative.   Respiratory: Negative for cough and shortness of breath.   Cardiovascular: Negative for chest pain, palpitations and leg swelling.  Gastrointestinal: Positive for diarrhea. Negative for abdominal pain, constipation, nausea, rectal pain and vomiting.  Genitourinary: Negative for difficulty urinating and dysuria.  Musculoskeletal: Positive for arthralgias, joint swelling and myalgias.       Left knee, overall pain has improved  Skin: Negative for color change and wound.  Neurological: Negative for dizziness and weakness.  Psychiatric/Behavioral: Negative for agitation, behavioral problems and confusion.    Past Medical History:  Diagnosis Date  . Anemia 1990  . Anxiety   . Arthritis 2004  . Asthma 1995  .  Colon polyp   . Complication of anesthesia 1966   first surgery she had- had a hard time waking up from surgery  . Diverticulosis 2003  . GERD (gastroesophageal reflux disease)    Past Surgical History:  Procedure Laterality Date  . APPENDECTOMY  1966  . CATARACT EXTRACTION     Bilateral  . CHOLECYSTECTOMY  2004  . COLON SURGERY  2008  . COLOSTOMY    . COLOSTOMY TAKEDOWN    . DIAGNOSTIC LAPAROSCOPY     exploratory for endometriosis  . HEEL SPUR SURGERY     Right, Metal Plate in Heel  . [place]  2010  . NEPHRECTOMY Left [id number]   As complication of partial colectomy for diverticulitis  . TONSILLECTOMY    . 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   Procedure: LEFT TOTAL KNEE ARTHROPLASTY;  Surgeon: Susa Day, MD;  Location: [facility] ORS;  Service: Orthopedics;  Laterality: Left;  Adductor Block   Social History:   reports that she has never smoked. She has never used smokeless tobacco. She reports that she drinks alcohol. She reports that she does not use drugs.  Family History  Problem Relation Age of Onset  . Colon polyps Other     niece  . Diabetes Paternal Grandmother   . Diabetes Paternal Aunt   . Heart failure Mother   . Heart attack Father     Medications: Patient's Medications  New Prescriptions   No medications on file  Previous Medications   ACETAMINOPHEN (TYLENOL) 500 MG TABLET    Take 1,000 mg by mouth every 6 (six) hours as needed for mild pain.   ALBUTEROL (PROVENTIL  HFA;VENTOLIN HFA) 108 (90 BASE) MCG/ACT INHALER    Inhale into the lungs every 6 (six) hours as needed for wheezing or shortness of breath.   ASPIRIN EC 325 MG TABLET    Take 1 tablet (325 mg total) by mouth 2 (two) times daily after a meal.   CETIRIZINE (ZYRTEC) 10 MG TABLET    Take 10 mg by mouth daily as needed for allergies.   DOCUSATE SODIUM (COLACE) 100 MG CAPSULE    Take 1 capsule (100 mg total) by mouth 2 (two) times daily as needed for mild constipation.   HOMEOPATHIC  PRODUCTS (SIMILASAN DRY EYE RELIEF OP)    Place 2 drops into both eyes 2 (two) times daily as needed (dryness).   LORAZEPAM (ATIVAN) 2 MG TABLET    Take 2 mg by mouth at bedtime.   OMEPRAZOLE (PRILOSEC PO)    Take 1 tablet by mouth daily as needed (heartburn).    OXYCODONE-ACETAMINOPHEN (PERCOCET) 5-325 MG TABLET    Take 1-2 tablets by mouth every 4 (four) hours as needed for severe pain.   PERPHENAZINE (TRILAFON) 2 MG TABLET    Take 6 mg by mouth at bedtime.   POLYETHYLENE GLYCOL (MIRALAX / GLYCOLAX) PACKET    Take 17 g by mouth daily.   TRAMADOL (ULTRAM) 50 MG TABLET    Take 50 mg by mouth daily as needed.  Modified Medications   No medications on file  Discontinued Medications   No medications on file     Physical Exam: Vitals:   04/11/16 1153  BP: 132/79  Pulse: 81  Resp: 17  Temp: 97.6 F (36.4 C)  SpO2: 92%  Weight: 214 lb (97.1 kg)  Height: 5\' 2"  (1.575 m)    Physical Exam  Constitutional: She is oriented to person, place, and time. She appears well-developed and well-nourished. No distress.  HENT:  Head: Normocephalic and atraumatic.  Mouth/Throat: Oropharynx is clear and moist. No oropharyngeal exudate.  Eyes: Conjunctivae are normal. Pupils are equal, round, and reactive to light.  Neck: Normal range of motion. Neck supple.  Cardiovascular: Normal rate, regular rhythm, normal heart sounds and intact distal pulses.   Pulmonary/Chest: Effort normal and breath sounds normal.  Abdominal: Soft. Bowel sounds are normal.  Musculoskeletal: She exhibits edema (to left leg). She exhibits no tenderness.  Neurological: She is alert and oriented to person, place, and time.  Skin: Skin is warm and dry. She is not diaphoretic.  aquacel dressing to left knee  Psychiatric: She has a normal mood and affect.    Labs reviewed: Basic Metabolic Panel:  Recent Labs  11/17/15 1610 03/28/16 1333 04/05/16 0402  NA 137 137 136  K 4.0 4.0 4.2  CL 102 102 99*  CO2 28 27 28     GLUCOSE 95 93 116*  BUN 9 13 15   CREATININE 1.14* 1.00 1.07*  CALCIUM 9.3 9.0 8.8*   Liver Function Tests:  Recent Labs  06/11/15 1211  AST 12  ALT 15  ALKPHOS 71  BILITOT 0.5  PROT 5.8*  ALBUMIN 3.9   No results for input(s): LIPASE, AMYLASE in the last 8760 hours. No results for input(s): AMMONIA in the last 8760 hours. CBC:  Recent Labs  11/17/15 1610 03/28/16 1333 04/05/16 0402 04/06/16 0510  WBC 7.1 8.9 14.9* 13.9*  NEUTROABS 4.9  --   --   --   HGB 14.7 15.0 13.1 12.7  HCT 44.9 43.7 39.1 37.2  MCV 91.8 88.3 89.9 88.6  PLT 246 252 251  212   TSH: No results for input(s): TSH in the last 8760 hours. A1C: No results found for: HGBA1C Lipid Panel: No results for input(s): CHOL, HDL, LDLCALC, TRIG, CHOLHDL, LDLDIRECT in the last 8760 hours.  Assessment/Plan 1. Primary osteoarthritis of left knee S/p left total knee arthroplasty, doing well with rehab. Minimal pain. conts ASA for prophylaxis.   2. Mild intermittent asthma without complication Controlled at this time.   3. Anxiety state Stable, pt reports she was seeing Dr Casimiro Needle but no longer follows up with him.   4. Constipation Diarrhea this morning, no Nausea or vomiting. to hold miralax and colace at this time.   Pt reports she lives alone and has 2 dogs who she lives with. Reports she does not take them outside because the yard is filled with poison and they go to the bathroom inside the house. Cleans up after them. Will get SW consult to ensure proper home environment and help with resources if needed  pt is stable for discharge-will need PT/OT/SW per home health. DME needed rolling walker and 3n1. Rx written not written, pt reports she has 3 weeks worth of Rx at home and will get refills from PCP. This includes pain medication.  will need to follow up with PCP within 2 weeks and follow up with ortho as scheduled   Dayanira Giovannetti K. Harle Battiest  [facility] [phone] 8 am - 5 pm) [phone] (after hours)

## 2016-05-10 ENCOUNTER — Telehealth: Payer: Self-pay

## 2016-05-10 ENCOUNTER — Encounter: Payer: Self-pay | Admitting: Family Medicine

## 2016-05-10 ENCOUNTER — Ambulatory Visit (INDEPENDENT_AMBULATORY_CARE_PROVIDER_SITE_OTHER): Payer: PRIVATE HEALTH INSURANCE

## 2016-05-10 ENCOUNTER — Ambulatory Visit (HOSPITAL_COMMUNITY): Admission: RE | Admit: 2016-05-10 | Payer: 59 | Source: Ambulatory Visit

## 2016-05-10 ENCOUNTER — Emergency Department (HOSPITAL_COMMUNITY): Payer: Medicare (Managed Care)

## 2016-05-10 ENCOUNTER — Ambulatory Visit (INDEPENDENT_AMBULATORY_CARE_PROVIDER_SITE_OTHER): Payer: PRIVATE HEALTH INSURANCE | Admitting: Ophthalmology

## 2016-05-10 ENCOUNTER — Ambulatory Visit (INDEPENDENT_AMBULATORY_CARE_PROVIDER_SITE_OTHER): Payer: PRIVATE HEALTH INSURANCE | Admitting: Family Medicine

## 2016-05-10 ENCOUNTER — Emergency Department (HOSPITAL_COMMUNITY)
Admission: EM | Admit: 2016-05-10 | Discharge: 2016-05-10 | Disposition: A | Discharge status: Home / Self Care | Payer: Medicare (Managed Care) | Attending: Emergency Medicine | Admitting: Emergency Medicine

## 2016-05-10 ENCOUNTER — Encounter (HOSPITAL_COMMUNITY): Payer: Self-pay | Admitting: Emergency Medicine

## 2016-05-10 VITALS — BP 120/80 | HR 102 | Temp 98.4°F | Resp 16 | Ht 63.0 in | Wt 209.8 lb

## 2016-05-10 DIAGNOSIS — Z7982 Long term (current) use of aspirin: Secondary | ICD-10-CM | POA: Diagnosis not present

## 2016-05-10 DIAGNOSIS — W19XXXA Unspecified fall, initial encounter: Secondary | ICD-10-CM

## 2016-05-10 DIAGNOSIS — R000 Tachycardia, unspecified: Secondary | ICD-10-CM

## 2016-05-10 DIAGNOSIS — Y999 Unspecified external cause status: Secondary | ICD-10-CM | POA: Insufficient documentation

## 2016-05-10 DIAGNOSIS — Y939 Activity, unspecified: Secondary | ICD-10-CM | POA: Diagnosis not present

## 2016-05-10 DIAGNOSIS — Z96652 Presence of left artificial knee joint: Secondary | ICD-10-CM | POA: Insufficient documentation

## 2016-05-10 DIAGNOSIS — J45909 Unspecified asthma, uncomplicated: Secondary | ICD-10-CM | POA: Insufficient documentation

## 2016-05-10 DIAGNOSIS — Y92009 Unspecified place in unspecified non-institutional (private) residence as the place of occurrence of the external cause: Secondary | ICD-10-CM | POA: Insufficient documentation

## 2016-05-10 DIAGNOSIS — W1839XA Other fall on same level, initial encounter: Secondary | ICD-10-CM | POA: Insufficient documentation

## 2016-05-10 DIAGNOSIS — S0990XA Unspecified injury of head, initial encounter: Secondary | ICD-10-CM

## 2016-05-10 DIAGNOSIS — R519 Headache, unspecified: Secondary | ICD-10-CM

## 2016-05-10 DIAGNOSIS — R51 Headache: Secondary | ICD-10-CM

## 2016-05-10 LAB — POC MICROSCOPIC URINALYSIS (UMFC): Mucus: ABSENT

## 2016-05-10 LAB — POCT URINALYSIS DIP (MANUAL ENTRY)
BILIRUBIN UA: NEGATIVE
GLUCOSE UA: NEGATIVE
Ketones, POC UA: NEGATIVE
Leukocytes, UA: NEGATIVE
Nitrite, UA: NEGATIVE
PH UA: 6
Protein Ur, POC: NEGATIVE
RBC UA: NEGATIVE
SPEC GRAV UA: 1.01
UROBILINOGEN UA: 0.2

## 2016-05-10 NOTE — Discharge Instructions (Signed)
Take Tylenol for headache Follow up with PCP

## 2016-05-10 NOTE — ED Provider Notes (Signed)
[place] DEPT Provider Note   CSN: [id number] Arrival date & time: 05/10/16  1313  By signing my name below, I, [name], attest that this documentation has been prepared under the direction and in the presence of [employer], PA-C. Electronically Signed: Sonum Logan, Education administrator. 05/10/16. 4:13 PM.  History   Chief Complaint Chief Complaint  Patient presents with  . Fall    The history is provided by the patient. No language interpreter was used.     HPI Comments: [name] is a 76 y.o. female who presents to the Emergency Department complaining of a HA that began last night and worsened this morning. She had a fall on 05/01/16 when she struck the back of her head. She did not have any physical complaints from the fall aside from a lump to the back of her head. She has applied ice with relief of the swelling. Last night she developed a severe headache. She has leftover Tramadol from a recent knee surgery and she took this medication without relief of her HA. This morning she woke up with a continued headache. She was seen by an Primary Care at [facility] walk in clinic this morning for her HA and was advised to come here for a CT scan. They did a CXR and EKG there which was unremarkable. She states her HA has now resolved. She denies any other issues at this time.   Past Medical History:  Diagnosis Date  . Anemia 1990  . Anxiety   . Arthritis 2004  . Asthma 1995  . Colon polyp   . Complication of anesthesia 1966   first surgery she had- had a hard time waking up from surgery  . Diverticulosis 2003  . GERD (gastroesophageal reflux disease)     Patient Active Problem List   Diagnosis Date Noted  . Primary osteoarthritis of left knee 04/04/2016  . 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 Asthma 12/07/2011  . Arthritis 12/07/2011  . 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 Obesity  12/07/2011  . DIVERTICULOSIS-COLON 12/01/2008  . ABDOMINAL PAIN-RLQ 12/01/2008     Past Surgical History:  Procedure Laterality Date  . APPENDECTOMY  1966  . CATARACT EXTRACTION     Bilateral  . CHOLECYSTECTOMY  2004  . COLON SURGERY  2008  . COLOSTOMY    . COLOSTOMY TAKEDOWN    . DIAGNOSTIC LAPAROSCOPY     exploratory for endometriosis  . HEEL SPUR SURGERY     Right, Metal Plate in Heel  . [place]  2010  . NEPHRECTOMY Left [id number]   As complication of partial colectomy for diverticulitis  . TONSILLECTOMY    . 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   Procedure: LEFT TOTAL KNEE ARTHROPLASTY;  Surgeon: Susa Day, MD;  Location: [facility] ORS;  Service: Orthopedics;  Laterality: Left;  Adductor Block    OB History    No data available       Home Medications    Prior to Admission medications   Medication Sig Start Date End Date Taking? Authorizing Provider  acetaminophen (TYLENOL) 500 MG tablet Take 1,000 mg by mouth every 6 (six) hours as needed for mild pain.    Historical Provider, MD  albuterol (PROVENTIL HFA;VENTOLIN HFA) 108 (90 Base) MCG/ACT inhaler Inhale into the lungs every 6 (six) hours as needed for wheezing or shortness of breath.    Historical Provider, MD  aspirin EC 325 MG tablet Take 1 tablet (325 mg total) by mouth 2 (  two) times daily after a meal. 04/04/16   Susa Day, MD  cetirizine (ZYRTEC) 10 MG tablet Take 10 mg by mouth daily as needed for allergies.    Historical Provider, MD  docusate sodium (COLACE) 100 MG capsule Take 1 capsule (100 mg total) by mouth 2 (two) times daily as needed for mild constipation. 04/04/16   Susa Day, MD  Homeopathic Products [facility] DRY EYE RELIEF OP) Place 2 drops into both eyes 2 (two) times daily as needed (dryness).    Historical Provider, MD  LORazepam (ATIVAN) 2 MG tablet Take 2 mg by mouth at bedtime.    Historical Provider, MD  Omeprazole (PRILOSEC PO) Take 1 tablet by mouth daily as needed (heartburn).     Historical Provider, MD  oxyCODONE-acetaminophen (PERCOCET) 5-325 MG  tablet Take 1-2 tablets by mouth every 4 (four) hours as needed for severe pain. Patient not taking: Reported on 05/10/2016 04/04/16   Susa Day, MD  perphenazine (TRILAFON) 2 MG tablet Take 6 mg by mouth at bedtime. 08/24/15   Historical Provider, MD  polyethylene glycol (MIRALAX / GLYCOLAX) packet Take 17 g by mouth daily. Patient not taking: Reported on 05/10/2016 04/04/16   Susa Day, MD  Sennosides-Docusate Sodium (STOOL SOFTENER/LAXATIVE PO) Take by mouth.    Historical Provider, MD  traMADol (ULTRAM) 50 MG tablet Take 50 mg by mouth daily as needed.    Historical Provider, MD    Family History Family History  Problem Relation Age of Onset  . Colon polyps Other     niece  . Diabetes Paternal Grandmother   . Diabetes Paternal Aunt   . Heart failure Mother   . Heart attack Father     Social History Social History  Substance Use Topics  . Smoking status: Never Smoker  . Smokeless tobacco: Never Used  . Alcohol use Yes     Comment: occassionally     Allergies   Haloperidol and Aripiprazole   Review of Systems Review of Systems  Skin: Negative for wound.  Neurological: Positive for headaches. Negative for syncope.  All other systems reviewed and are negative.    Physical Exam Updated Vital Signs BP 127/82   Pulse 89   Temp 97.6 F (36.4 C)   Resp 14   SpO2 92%   Physical Exam  Constitutional: She is oriented to person, place, and time. She appears well-developed and well-nourished.  HENT:  Head: Normocephalic. Head is without raccoon's eyes, without Battle's sign and without contusion.  Right Ear: No hemotympanum.  Left Ear: No hemotympanum.  Nose: Nose normal.  No wounds noted   Eyes: Conjunctivae and EOM are normal. Pupils are equal, round, and reactive to light.  Neck: Normal range of motion. Neck supple. No tracheal deviation present.  Cardiovascular: Normal rate.   Pulmonary/Chest: Effort normal.  Musculoskeletal: She exhibits no deformity.    Neurological: She is alert and oriented to person, place, and time. No sensory deficit. She exhibits normal muscle tone.  Skin: Skin is warm and dry.  Psychiatric: She has a normal mood and affect. Her behavior is normal. Thought content normal.  Nursing note and vitals reviewed.    ED Treatments / Results  DIAGNOSTIC STUDIES: Oxygen Saturation is 92% on RA, low by my interpretation.    COORDINATION OF CARE: 4:13 PM Discussed treatment plan with pt at bedside and pt agreed to plan.   Labs (all labs ordered are listed, but only abnormal results are displayed) Labs Reviewed - No data to display  EKG  EKG Interpretation None       Radiology Dg Chest 2 View  Result Date: 05/10/2016 CLINICAL DATA:  Headache since a fall 1 week ago. EXAM: CHEST  2 VIEW COMPARISON:  PA and lateral chest 06/11/2015. FINDINGS: Lungs clear. Heart size normal. No pneumothorax or pleural effusion. No acute bony abnormality. IMPRESSION: No acute disease. Electronically Signed   By: Inge Rise M.D.   On: 05/10/2016 12:47   Ct Head Wo Contrast  Result Date: 05/10/2016 CLINICAL DATA:  Initial evaluation for acute trauma, fall, struck head. EXAM: CT HEAD WITHOUT CONTRAST CT CERVICAL SPINE WITHOUT CONTRAST TECHNIQUE: Multidetector CT imaging of the head and cervical spine was performed following the standard protocol without intravenous contrast. Multiplanar CT image reconstructions of the cervical spine were also generated. COMPARISON:  Prior radiograph from 11/17/2015. FINDINGS: CT HEAD FINDINGS Brain: Cerebral volume within normal limits for age. Patchy hypodensity within the periventricular and deep white matter both cerebral hemispheres most consistent with chronic small vessel ischemic disease, moderate nature. No acute intracranial hemorrhage. No evidence for acute large vessel territory infarct. No mass lesion, midline shift or mass effect. No hydrocephalus. No extra-axial fluid collection. Vascular: No  hyperdense vessel. Scattered vascular calcifications noted within the carotid siphons. Skull: Scalp soft tissues demonstrate no acute abnormality. Calvarium intact. Hyperostosis frontalis interna noted. Sinuses/Orbits: Globes and orbital soft tissues within normal limits. Patient is status post lens extraction bilaterally. Small retention cyst noted within the right sphenoid sinus. Paranasal sinuses are otherwise clear. No mastoid effusion. CT CERVICAL SPINE FINDINGS Alignment: Straightening with smooth reversal the normal cervical lordosis. No listhesis. Skull base and vertebrae: Skullbase intact. Normal C1-2 articulations preserved. Dens is intact. Vertebral body heights are maintained. No acute fracture. Soft tissues and spinal canal: Visualized soft tissues of the neck demonstrate no acute abnormality. No prevertebral edema. 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 Disc levels: Moderate multilevel degenerative spondylolysis, greatest at C5-6 and C6-7. Upper chest: Visualized upper chest within normal limits. No apical pneumothorax. IMPRESSION: CT BRAIN: 1. No acute intracranial process identified. 2. Moderate chronic microvascular ischemic disease. CT CERVICAL SPINE: No acute traumatic injury within the cervical spine. Electronically Signed   By: Jeannine Boga M.D.   On: 05/10/2016 15:24   Ct Cervical Spine Wo Contrast  Result Date: 05/10/2016 CLINICAL DATA:  Initial evaluation for acute trauma, fall, struck head. EXAM: CT HEAD WITHOUT CONTRAST CT CERVICAL SPINE WITHOUT CONTRAST TECHNIQUE: Multidetector CT imaging of the head and cervical spine was performed following the standard protocol without intravenous contrast. Multiplanar CT image reconstructions of the cervical spine were also generated. COMPARISON:  Prior radiograph from 11/17/2015. FINDINGS: CT HEAD FINDINGS Brain: Cerebral volume within normal limits for age. Patchy hypodensity within the periventricular and deep white  matter both cerebral hemispheres most consistent with chronic small vessel ischemic disease, moderate nature. No acute intracranial hemorrhage. No evidence for acute large vessel territory infarct. No mass lesion, midline shift or mass effect. No hydrocephalus. No extra-axial fluid collection. Vascular: No hyperdense vessel. Scattered vascular calcifications noted within the carotid siphons. Skull: Scalp soft tissues demonstrate no acute abnormality. Calvarium intact. Hyperostosis frontalis interna noted. Sinuses/Orbits: Globes and orbital soft tissues within normal limits. Patient is status post lens extraction bilaterally. Small retention cyst noted within the right sphenoid sinus. Paranasal sinuses are otherwise clear. No mastoid effusion. CT CERVICAL SPINE FINDINGS Alignment: Straightening with smooth reversal the normal cervical lordosis. No listhesis. Skull base and vertebrae: Skullbase intact. Normal C1-2 articulations preserved. Dens  is intact. Vertebral body heights are maintained. No acute fracture. Soft tissues and spinal canal: Visualized soft tissues of the neck demonstrate no acute abnormality. No prevertebral edema. 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 Disc levels: Moderate multilevel degenerative spondylolysis, greatest at C5-6 and C6-7. Upper chest: Visualized upper chest within normal limits. No apical pneumothorax. IMPRESSION: CT BRAIN: 1. No acute intracranial process identified. 2. Moderate chronic microvascular ischemic disease. CT CERVICAL SPINE: No acute traumatic injury within the cervical spine. Electronically Signed   By: Jeannine Boga M.D.   On: 05/10/2016 15:24    Procedures Procedures (including critical care time)  Medications Ordered in ED Medications - No data to display   Initial Impression / Assessment and Plan / ED Course  I have reviewed the triage vital signs and the nursing notes.  Pertinent labs & imaging results that were available  during my care of the patient were reviewed by me and considered in my medical decision making (see chart for details).  76 year old female presents with post-traumatic headache. Patient is afebrile, not tachycardic or tachypneic, normotensive, and not hypoxic. CT head and neck are unremarkable. Exam is unremarkable. Patient states headache is gone. Advised Tylenol for pain as needed and follow up with PCP. She verbalized understanding.  Final Clinical Impressions(s) / ED Diagnoses   Final diagnoses:  Fall in home, initial encounter  Injury of head, initial encounter  Bad headache    New Prescriptions New Prescriptions   No medications on file   I personally performed the services described in this documentation, which was scribed in my presence. The recorded information has been reviewed and is accurate.    Recardo Evangelist, PA-C 05/10/16 1809    Varney Biles, MD 05/12/16 [phone]

## 2016-05-10 NOTE — Telephone Encounter (Signed)
Pt's PCP called back and we spoke about today's visit. She will get all info to the referrals coordinator to see if they can file a referral for today's visit at our office. Referrals coordinator will call back.

## 2016-05-10 NOTE — Progress Notes (Signed)
Patient ID: [name], female    DOB: 1941/01/06, 76 y.o.   MRN: [id number]  PCP: Elizabeth Rima, MD  Chief Complaint  Patient presents with  . [place]    last Wednesday, hit the back of head, headache took Tramadol this am    Subjective:  HPI 76 year old female presents for evaluation of headache following a fall x 1 week ago and she hit her head. Patient reports she underwent knee replacement surgery in December 2017 of her left knee and was at home recovering. She laid down on 05/02/16 and did not fall asleep, walked to the bathroom and upon walking to her living area she reports falling and landing on the back of her head and left shoulder. Soon after fall, patient reports developing mild occipital swelling which resolved within 24 hours with application of ice. She presents today as she developed a headache, reports pain was a  10/10. Took tramadol last night that didn't relieve headache and took another tramadol today which resolved headache. Denies visual disturbance,  numbness or tingling, tremors, dizziness, or forgetfulness,. Dr. Tonita Cong, her ortho physician, examined and assessed right knee post fall, pt reports knee replacement as intact.  Social History   Social History  . Marital status: Legally Separated    Spouse name: N/A  . Number of children: 10  . Years of education: N/A   Occupational History  . retired    Social History Main Topics  . Smoking status: Never Smoker  . Smokeless tobacco: Never Used  . Alcohol use Yes     Comment: occassionally  . Drug use: No  . Sexual activity: Not Currently   Other Topics Concern  . Not on file   Social History Narrative  . No narrative on file    Family History  Problem Relation Age of Onset  . Colon polyps Other     niece  . Diabetes Paternal Grandmother   . Diabetes Paternal Aunt   . Heart failure Mother   . Heart attack Father    Review of Systems See HPI  Patient Active Problem List   Diagnosis  Date Noted  . Primary osteoarthritis of left knee 04/04/2016  . 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 Asthma 12/07/2011  . Arthritis 12/07/2011  . 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 Obesity  12/07/2011  . DIVERTICULOSIS-COLON 12/01/2008  . ABDOMINAL PAIN-RLQ 12/01/2008    Allergies  Allergen Reactions  . Haloperidol Anaphylaxis  . Aripiprazole Other (See Comments)    unknown unknown    Prior to Admission medications   Medication Sig Start Date End Date Taking? Authorizing Provider  albuterol (PROVENTIL HFA;VENTOLIN HFA) 108 (90 Base) MCG/ACT inhaler Inhale into the lungs every 6 (six) hours as needed for wheezing or shortness of breath.   Yes Historical Provider, MD  aspirin EC 325 MG tablet Take 1 tablet (325 mg total) by mouth 2 (two) times daily after a meal. 04/04/16  Yes Susa Day, MD  docusate sodium (COLACE) 100 MG capsule Take 1 capsule (100 mg total) by mouth 2 (two) times daily as needed for mild constipation. 04/04/16  Yes Susa Day, MD  Homeopathic Products [facility] DRY EYE RELIEF OP) Place 2 drops into both eyes 2 (two) times daily as needed (dryness).   Yes Historical Provider, MD  LORazepam (ATIVAN) 2 MG tablet Take 2 mg by mouth at bedtime.   Yes Historical Provider, MD  Omeprazole (PRILOSEC PO) Take 1 tablet by mouth daily  as needed (heartburn).    Yes Historical Provider, MD  perphenazine (TRILAFON) 2 MG tablet Take 6 mg by mouth at bedtime. 08/24/15  Yes Historical Provider, MD  Sennosides-Docusate Sodium (STOOL SOFTENER/LAXATIVE PO) Take by mouth.   Yes Historical Provider, MD  traMADol (ULTRAM) 50 MG tablet Take 50 mg by mouth daily as needed.   Yes Historical Provider, MD  acetaminophen (TYLENOL) 500 MG tablet Take 1,000 mg by mouth every 6 (six) hours as needed for mild pain.    Historical Provider, MD  cetirizine (ZYRTEC) 10 MG tablet Take 10 mg by mouth daily as needed for allergies.    Historical Provider, MD  oxyCODONE-acetaminophen  (PERCOCET) 5-325 MG tablet Take 1-2 tablets by mouth every 4 (four) hours as needed for severe pain. Patient not taking: Reported on 05/10/2016 04/04/16   Susa Day, MD  polyethylene glycol [facility] / Floria Raveling) packet Take 17 g by mouth daily. Patient not taking: Reported on 05/10/2016 04/04/16   Susa Day, MD    Past Medical, Surgical Family and Social History reviewed and updated.    Objective:   Today's Vitals   05/10/16 1055  BP: 120/80  Pulse: (!) 102  Resp: 16  Temp: 98.4 F (36.9 C)  TempSrc: Oral  SpO2: 91%  Weight: 209 lb 12.8 oz (95.2 kg)  Height: 5\' 3"  (1.6 m)    Wt Readings from Last 3 Encounters:  05/10/16 209 lb 12.8 oz (95.2 kg)  04/11/16 214 lb (97.1 kg)  04/10/16 214 lb (97.1 kg)   Physical Exam  Constitutional: She is oriented to person, place, and time. She appears well-developed and well-nourished.  HENT:  Head: Normocephalic and atraumatic.  Neck: Normal range of motion. Neck supple.  Cardiovascular: Normal rate, regular rhythm, normal heart sounds and intact distal pulses.   Pulmonary/Chest: Effort normal and breath sounds normal.  Musculoskeletal: Normal range of motion. She exhibits no tenderness.  Recent right knee replacement   Neurological: She is alert and oriented to person, place, and time. She has normal strength. She displays a negative Romberg sign. GCS eye subscore is 4. GCS verbal subscore is 5. GCS motor subscore is 6.  Negative for tremors or nystagmus. Cerebellar function intact.     Assessment & Plan:  1. Tachycardia 2. Injury of head, initial encounter Neurological status intact. Feels pt should have further imaging due to sudden onset headache and patient is unable to recall events that occurred immediately prior to fall. Order was placed for STAT head CT. During the process of obtaining prior authorization, it was determined that our practice, [facility] was not within her network. Therefore, the STAT CT would  require payment from patient if she were to obtain from my original order. Her existing primary care office could not work her in today for evaluation.  The patient had left the office and was en-route to obtain a CT scan at [facility]. The radiology department was notified by clinic TL to advise the patient to be seen and evaluated through [facility] in order for imaging to be covered through her health insurance.   Carroll Sage. Kenton Kingfisher, MSN, FNP-C [facility]

## 2016-05-10 NOTE — ED Triage Notes (Signed)
Pt sent to hospital for Head CT scan by PCP. Tried to go to radiology for outpatient scan as ordered on discharge papers, but issues came up and was told to come to ED for scan instead.  Pt reports she fell several days ago. Has had HA since then. Pt not on blood thinners.

## 2016-05-10 NOTE — Telephone Encounter (Signed)
Pt came to our office 2/1 for treatment of a fall and injury to the head. Her insurance card does not state that referrals are required, and as an urgent situation, we treated the pt. The provider immediately ordered a STAT CT of the head for pt due to her symptoms. Upon attempting to gain authorization, I was informed that referrals are required for pt to be seen by any provider outside of her selected PCP. Insurance rep suggested that we either change her selected PCP to one of our providers or that we ask her selected PCP to submit a referral for the patient to be seen at our office.   The pt and I attempted to change her PCP through [employer] to one of our providers. We were informed that none of our providers are in network and cannot be selected as her PCP.   I then called her PCP's office (dr Helane Rima) to ask that they place a referral for this patient to be seen at our office. After explaining the situation and the fact that an emergency CT was deemed medically necessary, they denied our request to place any referrals without seeing the patient first. I again stated that the patient came here on an emergency basis and that she needed further treatment asap. They did not place a referral for our office nor for the CT .  In efforts to reduce the patients costs, we cancelled the scheduled CT and sent her to the ER as a walk in.

## 2016-05-10 NOTE — Patient Instructions (Addendum)
  Report to [facility] radiology for a Stat CT Scan as soon as you leave our office.  I will contact you with the results from your CT Scan.   IF you received an x-ray today, you will receive an invoice from [facility] Radiology. Please contact [facility] Radiology at [phone] with questions or concerns regarding your invoice.   IF you received labwork today, you will receive an invoice from [facility]. Please contact [facility] at [phone] with questions or concerns regarding your invoice.   Our billing staff will not be able to assist you with questions regarding bills from these companies.  You will be contacted with the lab results as soon as they are available. The fastest way to get your results is to activate your My Chart account. Instructions are located on the last page of this paperwork. If you have not heard from [place] regarding the results in 2 weeks, please contact this office.

## 2016-05-12 LAB — URINE CULTURE

## 2016-05-16 ENCOUNTER — Ambulatory Visit: Payer: Medicare (Managed Care) | Admitting: Physician Assistant

## 2016-05-17 ENCOUNTER — Ambulatory Visit (INDEPENDENT_AMBULATORY_CARE_PROVIDER_SITE_OTHER): Payer: PRIVATE HEALTH INSURANCE | Admitting: Family Medicine

## 2016-05-17 VITALS — BP 150/88 | HR 88 | Temp 97.5°F | Ht 63.0 in | Wt 208.4 lb

## 2016-05-17 DIAGNOSIS — J40 Bronchitis, not specified as acute or chronic: Secondary | ICD-10-CM

## 2016-05-17 DIAGNOSIS — R6889 Other general symptoms and signs: Secondary | ICD-10-CM

## 2016-05-17 LAB — POCT INFLUENZA A/B
Influenza A, POC: NEGATIVE
Influenza B, POC: NEGATIVE

## 2016-05-17 MED ORDER — AMOXICILLIN-POT CLAVULANATE 875-125 MG PO TABS: 1.0000 | ORAL_TABLET | Freq: Two times a day (BID) | ORAL | 0 refills | Status: DC

## 2016-05-17 MED ORDER — IPRATROPIUM BROMIDE 0.02 % IN SOLN
0.5000 mg | Freq: Once | RESPIRATORY_TRACT | Status: AC
  Administered 2016-05-17: 0.5 mg via RESPIRATORY_TRACT

## 2016-05-17 MED ORDER — OSELTAMIVIR PHOSPHATE 75 MG PO CAPS: 75.0000 mg | ORAL_CAPSULE | Freq: Two times a day (BID) | ORAL | 0 refills | Status: DC

## 2016-05-17 MED ORDER — ALBUTEROL SULFATE (2.5 MG/3ML) 0.083% IN NEBU
2.5000 mg | INHALATION_SOLUTION | Freq: Once | RESPIRATORY_TRACT | Status: AC
  Administered 2016-05-17: 2.5 mg via RESPIRATORY_TRACT

## 2016-05-17 MED ORDER — PREDNISONE 20 MG PO TABS: 40.0000 mg | ORAL_TABLET | Freq: Every day | ORAL | 0 refills | Status: DC

## 2016-05-17 NOTE — Patient Instructions (Addendum)
Bronchitis -Start Augmentin 1 tablet, twice daily for 10 days. Influenza   -Start Tami-flu 75 mg twice daily for 5 days.  -Take prednisone 40 mg with breakfast starting today for 5 days.  Continue Albuterol inhaler as needed for shortness of breath.  Return for follow-up in 7 days. If you begin to experience worsening shortness of breath, wheezing, or fever, return for care or follow-up to be seen at the nearest ED.     IF you received an x-ray today, you will receive an invoice from [facility]. Please contact [facility] at [phone] with questions or concerns regarding your invoice.   IF you received labwork today, you will receive an invoice from [place]. Please contact [facility] at [phone] with questions or concerns regarding your invoice.   Our billing staff will not be able to assist you with questions regarding bills from these companies.  You will be contacted with the lab results as soon as they are available. The fastest way to get your results is to activate your My Chart account. Instructions are located on the last page of this paperwork. If you have not heard from [place] regarding the results in 2 weeks, please contact this office.     Influenza, Adult Influenza, more commonly known as "the flu," is a viral infection that primarily affects the respiratory tract. The respiratory tract includes organs that help you breathe, such as the lungs, nose, and throat. The flu causes many common cold symptoms, as well as a high fever and body aches. The flu spreads easily from person to person (is contagious). Getting a flu shot (influenza vaccination) every year is the best way to prevent influenza. What are the causes? Influenza is caused by a virus. You can catch the virus by:  Breathing in droplets from an infected person's cough or sneeze.  Touching something that was recently contaminated with the virus and then touching your mouth, nose, or  eyes. What increases the risk? The following factors may make you more likely to get the flu:  Not cleaning your hands frequently with soap and water or alcohol-based hand sanitizer.  Having close contact with many people during cold and flu season.  Touching your mouth, eyes, or nose without washing or sanitizing your hands first.  Not drinking enough fluids or not eating a healthy diet.  Not getting enough sleep or exercise.  Being under a high amount of stress.  Not getting a yearly (annual) flu shot. You may be at a higher risk of complications from the flu, such as a severe lung infection (pneumonia), if you:  Are over the age of 10.  Are pregnant.  Have a weakened disease-fighting system (immune system). You may have a weakened immune system if you:  Have HIV or AIDS.  Are undergoing chemotherapy.  Aretaking medicines that reduce the activity of (suppress) the immune system.  Have a long-term (chronic) illness, such as heart disease, kidney disease, diabetes, or lung disease.  Have a liver disorder.  Are obese.  Have anemia. What are the signs or symptoms? Symptoms of this condition typically last 4-10 days and may include:  Fever.  Chills.  Headache, body aches, or muscle aches.  Sore throat.  Cough.  Runny or congested nose.  Chest discomfort and cough.  Poor appetite.  Weakness or tiredness (fatigue).  Dizziness.  Nausea or vomiting. How is this diagnosed? This condition may be diagnosed based on your medical history and a physical exam. Your health care provider may do  a nose or throat swab test to confirm the diagnosis. How is this treated? If influenza is detected early, you can be treated with antiviral medicine that can reduce the length of your illness and the severity of your symptoms. This medicine may be given by mouth (orally) or through an IV tube that is inserted in one of your veins. The goal of treatment is to relieve symptoms  by taking care of yourself at home. This may include taking over-the-counter medicines, drinking plenty of fluids, and adding humidity to the air in your home. In some cases, influenza goes away on its own. Severe influenza or complications from influenza may be treated in a hospital. Follow these instructions at home:  Take over-the-counter and prescription medicines only as told by your health care provider.  Use a cool mist humidifier to add humidity to the air in your home. This can make breathing easier.  Rest as needed.  Drink enough fluid to keep your urine clear or pale yellow.  Cover your mouth and nose when you cough or sneeze.  Wash your hands with soap and water often, especially after you cough or sneeze. If soap and water are not available, use hand sanitizer.  Stay home from work or school as told by your health care provider. Unless you are visiting your health care provider, try to avoid leaving home until your fever has been gone for 24 hours without the use of medicine.  Keep all follow-up visits as told by your health care provider. This is important. How is this prevented?  Getting an annual flu shot is the best way to avoid getting the flu. You may get the flu shot in late summer, fall, or winter. Ask your health care provider when you should get your flu shot.  Wash your hands often or use hand sanitizer often.  Avoid contact with people who are sick during cold and flu season.  Eat a healthy diet, drink plenty of fluids, get enough sleep, and exercise regularly. Contact a health care provider if:  You develop new symptoms.  You have:  Chest pain.  Diarrhea.  A fever.  Your cough gets worse.  You produce more mucus.  You feel nauseous or you vomit. Get help right away if:  You develop shortness of breath or difficulty breathing.  Your skin or nails turn a bluish color.  You have severe pain or stiffness in your neck.  You develop a sudden  headache or sudden pain in your face or ear.  You cannot stop vomiting. This information is not intended to replace advice given to you by your health care provider. Make sure you discuss any questions you have with your health care provider. Document Released: 03/23/2000 Document Revised: 09/01/2015 Document Reviewed: 01/18/2015 Elsevier Interactive Patient Education  2017 [employer]  Acute Bronchitis, Adult Acute bronchitis is when air tubes (bronchi) in the lungs suddenly get swollen. The condition can make it hard to breathe. It can also cause these symptoms:  A cough.  Coughing up clear, yellow, or green mucus.  Wheezing.  Chest congestion.  Shortness of breath.  A fever.  Body aches.  Chills.  A sore throat. Follow these instructions at home: Medicines  Take over-the-counter and prescription medicines only as told by your doctor.  If you were prescribed an antibiotic medicine, take it as told by your doctor. Do not stop taking the antibiotic even if you start to feel better. General instructions  Rest.  Drink enough fluids  to keep your pee (urine) clear or pale yellow.  Avoid smoking and secondhand smoke. If you smoke and you need help quitting, ask your doctor. Quitting will help your lungs heal faster.  Use an inhaler, cool mist vaporizer, or humidifier as told by your doctor.  Keep all follow-up visits as told by your doctor. This is important. How is this prevented? To lower your risk of getting this condition again:  Wash your hands often with soap and water. If you cannot use soap and water, use hand sanitizer.  Avoid contact with people who have cold symptoms.  Try not to touch your hands to your mouth, nose, or eyes.  Make sure to get the flu shot every year. Contact a doctor if:  Your symptoms do not get better in 2 weeks. Get help right away if:  You cough up blood.  You have chest pain.  You have very bad shortness of breath.  You  become dehydrated.  You faint (pass out) or keep feeling like you are going to pass out.  You keep throwing up (vomiting).  You have a very bad headache.  Your fever or chills gets worse. This information is not intended to replace advice given to you by your health care provider. Make sure you discuss any questions you have with your health care provider. Document Released: 09/12/2007 Document Revised: 11/02/2015 Document Reviewed: 09/14/2015 Elsevier Interactive Patient Education  2017 [employer].

## 2016-05-17 NOTE — Progress Notes (Signed)
Patient ID: [name], female    DOB: 1940/10/15, 76 y.o.   MRN: [id number]  PCP: Suzanna Obey, MD  Chief Complaint  Patient presents with  . Headache    X 1 day  . Cough    X 2-3 days    Subjective:  HPI  76 year old female presents for evaluation of headache and cough x 1-3 days. Patient was seen and evaluated here at PCP for a reoccurring headache following a fall now two weeks ago. Patient received a CT scan which was negative of any acute findings related to the fall. She was scanned at [facility] ED and subsequently began feeling poorly with a cough 72 hours later. Patient reports generalized body aches, felt feverish last evening, cough is spasmodic at times to the point she can't stop. She has used her albuterol inhaler last evening to improve shortness of breath. Denies N&V or abdominal discomfort. Reports her most recent sequence of headaches started 3 days ago and she feels is associated with this illness. Reports her throat is sore due to dryness. Reports that she is drinking fluids regularly.    Social History   Social History  . Marital status: Legally Separated    Spouse name: N/A  . Number of children: 10  . Years of education: N/A   Occupational History  . retired    Social History Main Topics  . Smoking status: Never Smoker  . Smokeless tobacco: Never Used  . Alcohol use Yes     Comment: occassionally  . Drug use: No  . Sexual activity: Not Currently   Other Topics Concern  . Not on file   Social History Narrative  . No narrative on file    Family History  Problem Relation Age of Onset  . Colon polyps Other     niece  . Diabetes Paternal Grandmother   . Diabetes Paternal Aunt   . Heart failure Mother   . Heart attack Father    Review of Systems  See HPI  Patient Active Problem List   Diagnosis Date Noted  . Primary osteoarthritis of left knee 04/04/2016  . 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 Asthma 12/07/2011  . Arthritis 12/07/2011    . 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 Obesity  12/07/2011  . DIVERTICULOSIS-COLON 12/01/2008  . ABDOMINAL PAIN-RLQ 12/01/2008    Allergies  Allergen Reactions  . Haloperidol Anaphylaxis  . Aripiprazole Other (See Comments)    unknown unknown    Prior to Admission medications   Medication Sig Start Date End Date Taking? Authorizing Provider  acetaminophen (TYLENOL) 500 MG tablet Take 1,000 mg by mouth every 6 (six) hours as needed for mild pain.   Yes Historical Provider, MD  albuterol (PROVENTIL HFA;VENTOLIN HFA) 108 (90 Base) MCG/ACT inhaler Inhale into the lungs every 6 (six) hours as needed for wheezing or shortness of breath.   Yes Historical Provider, MD  aspirin EC 325 MG tablet Take 1 tablet (325 mg total) by mouth 2 (two) times daily after a meal. 04/04/16  Yes Susa Day, MD  cetirizine (ZYRTEC) 10 MG tablet Take 10 mg by mouth daily as needed for allergies.   Yes Historical Provider, MD  docusate sodium (COLACE) 100 MG capsule Take 1 capsule (100 mg total) by mouth 2 (two) times daily as needed for mild constipation. 04/04/16  Yes Susa Day, MD  Homeopathic Products [facility] DRY EYE RELIEF OP) Place 2 drops into both eyes 2 (two)  times daily as needed (dryness).   Yes Historical Provider, MD  LORazepam (ATIVAN) 2 MG tablet Take 2 mg by mouth at bedtime.   Yes Historical Provider, MD  Omeprazole (PRILOSEC PO) Take 1 tablet by mouth daily as needed (heartburn).    Yes Historical Provider, MD  perphenazine (TRILAFON) 2 MG tablet Take 6 mg by mouth at bedtime. 08/24/15  Yes Historical Provider, MD  Sennosides-Docusate Sodium (STOOL SOFTENER/LAXATIVE PO) Take by mouth.   Yes Historical Provider, MD  traMADol (ULTRAM) 50 MG tablet Take 50 mg by mouth daily as needed.   Yes Historical Provider, MD  oxyCODONE-acetaminophen (PERCOCET) 5-325 MG tablet Take 1-2 tablets by mouth every 4 (four) hours as needed for severe pain. Patient not taking: Reported on  05/10/2016 04/04/16   Susa Day, MD  polyethylene glycol [facility] / Floria Raveling) packet Take 17 g by mouth daily. Patient not taking: Reported on 05/10/2016 04/04/16   Susa Day, MD    Past Medical, Surgical Family and Social History reviewed and updated.    Objective:   Today's Vitals   05/17/16 0905  BP: (!) 150/88  Pulse: 88  Temp: 97.5 F (36.4 C)  TempSrc: Oral  SpO2: 95%  Weight: 208 lb 6.4 oz (94.5 kg)  Height: 5\' 3"  (1.6 m)    Wt Readings from Last 3 Encounters:  05/17/16 208 lb 6.4 oz (94.5 kg)  05/10/16 209 lb 12.8 oz (95.2 kg)  04/11/16 214 lb (97.1 kg)   Physical Exam  Constitutional: She is oriented to person, place, and time. She appears well-developed and well-nourished. She appears ill.  HENT:  Head: Normocephalic and atraumatic.  Right Ear: External ear normal.  Left Ear: External ear normal.  Nose: Nose normal.  Mouth/Throat: Oropharynx is clear and moist.  Eyes: Conjunctivae and EOM are normal. Pupils are equal, round, and reactive to light.  Neck: Normal range of motion. Neck supple.  Cardiovascular: Normal rate, regular rhythm, normal heart sounds and intact distal pulses.   Pulmonary/Chest: Effort normal. She has wheezes. She exhibits tenderness.  Neurological: She is alert and oriented to person, place, and time.  Skin: Skin is warm and dry.  Psychiatric: She has a normal mood and affect. Her behavior is normal. Judgment and thought content normal.     Assessment & Plan:  1. Bronchitis 2. Flu-like symptoms  Plan: -Start Augmentin 1 tablet, twice daily for 10 days. Influenza   -Start Tami-flu 75 mg twice daily for 5 days.  -Take prednisone 40 mg with breakfast starting today for 5 days.  Continue Albuterol inhaler as needed for shortness of breath.  Return for follow-up in 7 days.  If you begin to experience worsening shortness of breath, wheezing, or fever, return for care or follow-up to be seen at the nearest ED.    Carroll Sage.  Kenton Kingfisher, MSN, FNP-C [facility] [place]

## 2016-05-24 ENCOUNTER — Ambulatory Visit (INDEPENDENT_AMBULATORY_CARE_PROVIDER_SITE_OTHER): Payer: Medicaid Other | Admitting: Physician Assistant

## 2016-05-24 ENCOUNTER — Ambulatory Visit (INDEPENDENT_AMBULATORY_CARE_PROVIDER_SITE_OTHER): Payer: PRIVATE HEALTH INSURANCE

## 2016-05-24 ENCOUNTER — Ambulatory Visit: Payer: PRIVATE HEALTH INSURANCE

## 2016-05-24 VITALS — BP 130/84 | HR 98 | Temp 97.6°F | Resp 18 | Ht 63.0 in | Wt 208.4 lb

## 2016-05-24 DIAGNOSIS — R059 Cough, unspecified: Secondary | ICD-10-CM

## 2016-05-24 DIAGNOSIS — R062 Wheezing: Secondary | ICD-10-CM

## 2016-05-24 DIAGNOSIS — R05 Cough: Secondary | ICD-10-CM

## 2016-05-24 DIAGNOSIS — Z8709 Personal history of other diseases of the respiratory system: Secondary | ICD-10-CM | POA: Diagnosis not present

## 2016-05-24 MED ORDER — IPRATROPIUM BROMIDE 0.02 % IN SOLN
0.5000 mg | Freq: Once | RESPIRATORY_TRACT | Status: AC
  Administered 2016-05-24: 0.5 mg via RESPIRATORY_TRACT

## 2016-05-24 MED ORDER — ALBUTEROL SULFATE (2.5 MG/3ML) 0.083% IN NEBU
2.5000 mg | INHALATION_SOLUTION | Freq: Once | RESPIRATORY_TRACT | Status: AC
  Administered 2016-05-24: 2.5 mg via RESPIRATORY_TRACT

## 2016-05-24 MED ORDER — PREDNISONE 20 MG PO TABS: 20.0000 mg | ORAL_TABLET | Freq: Two times a day (BID) | ORAL | 0 refills | Status: DC

## 2016-05-24 NOTE — Progress Notes (Signed)
[name]  MRN: [id number] DOB: 1941/02/15  PCP: Elizabeth Obey, MD  Subjective:  Pt is a 76 year old female PMH asthma, arthritis who presents to clinic for f/u cough.  Pt was here 2/08 for headache and cough. She was treated for bronchitis and prescribed Augmentin, Tamiflu and prednisone. She is still taking Augmentin.   Today c/o cough. Productive with clear and sticky liquid. Some wheezing. Taking her inhaler - about 3 times this week.  Cough is worse when she is eating - this is not new.  Denies SOB, fever, chills, chest pain, palpitations.  Dec 27 TK replacement.  H/o reflux - reports chronic cough, however this is different bc it is productive.   Review of Systems  Constitutional: Negative for chills and fever.  Respiratory: Positive for cough. Negative for chest tightness, shortness of breath and wheezing.   Cardiovascular: Negative for chest pain and palpitations.    Patient Active Problem List   Diagnosis Date Noted  . Primary osteoarthritis of left knee 04/04/2016  . 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 Asthma 12/07/2011  . Arthritis 12/07/2011  . 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 Obesity  12/07/2011  . DIVERTICULOSIS-COLON 12/01/2008  . ABDOMINAL PAIN-RLQ 12/01/2008    Current Outpatient Prescriptions on File Prior to Visit  Medication Sig Dispense Refill  . acetaminophen (TYLENOL) 500 MG tablet Take 1,000 mg by mouth every 6 (six) hours as needed for mild pain.    Marland Kitchen albuterol (PROVENTIL HFA;VENTOLIN HFA) 108 (90 Base) MCG/ACT inhaler Inhale into the lungs every 6 (six) hours as needed for wheezing or shortness of breath.    Marland Kitchen amoxicillin-clavulanate (AUGMENTIN) 875-125 MG tablet Take 1 tablet by mouth 2 (two) times daily. 20 tablet 0  . aspirin EC 325 MG tablet Take 1 tablet (325 mg total) by mouth 2 (two) times daily after a meal. 60 tablet 0  . cetirizine (ZYRTEC) 10 MG tablet Take 10 mg by mouth daily as needed for allergies.    Marland Kitchen  docusate sodium (COLACE) 100 MG capsule Take 1 capsule (100 mg total) by mouth 2 (two) times daily as needed for mild constipation. 30 capsule 1  . Homeopathic Products (SIMILASAN DRY EYE RELIEF OP) Place 2 drops into both eyes 2 (two) times daily as needed (dryness).    . LORazepam (ATIVAN) 2 MG tablet Take 2 mg by mouth at bedtime.    . Omeprazole (PRILOSEC PO) Take 1 tablet by mouth daily as needed (heartburn).     Marland Kitchen perphenazine (TRILAFON) 2 MG tablet Take 6 mg by mouth at bedtime.    . polyethylene glycol (MIRALAX / GLYCOLAX) packet Take 17 g by mouth daily. 14 each 0  . Sennosides-Docusate Sodium (STOOL SOFTENER/LAXATIVE PO) Take by mouth.    . oxyCODONE-acetaminophen (PERCOCET) 5-325 MG tablet Take 1-2 tablets by mouth every 4 (four) hours as needed for severe pain. (Patient not taking: Reported on 05/24/2016) 40 tablet 0  . traMADol (ULTRAM) 50 MG tablet Take 50 mg by mouth daily as needed.     No current facility-administered medications on file prior to visit.     Allergies  Allergen Reactions  . Haloperidol Anaphylaxis  . Aripiprazole Other (See Comments)    unknown unknown     Objective:  BP 130/84 (BP Location: Right Arm, Patient Position: Sitting, Cuff Size: Normal)   Pulse 98   Temp 97.6 F (36.4 C) (Oral)   Resp 18   Ht 5\' 3"  (1.6 m)  Wt 208 lb 6.4 oz (94.5 kg)   SpO2 95%   BMI 36.92 kg/m   Physical Exam  Constitutional: She is oriented to person, place, and time and well-developed, well-nourished, and in no distress. She appears healthy. She does not have a sickly appearance. No distress.  Cardiovascular: Normal rate, regular rhythm and normal heart sounds.   Pulmonary/Chest: Effort normal. She has wheezes in the right middle field, the right lower field, the left middle field and the left lower field. She has no rhonchi. She has no rales.  Neurological: She is alert and oriented to person, place, and time. GCS score is 15.  Skin: Skin is warm and dry.    Psychiatric: Mood, memory, affect and judgment normal.  Vitals reviewed.   Dg Chest 2 View  Result Date: 05/24/2016 CLINICAL DATA:  Cough for 10 days. EXAM: CHEST  2 VIEW COMPARISON:  05/10/2016 FINDINGS: Mild thoracic spondylosis. Midline trachea. Normal heart size and mediastinal contours. No pleural effusion or pneumothorax. No congestive failure. No lobar consolidation. IMPRESSION: No acute cardiopulmonary disease. Electronically Signed   By: Abigail Miyamoto M.D.   On: 05/24/2016 12:37    Assessment and Plan :  1. Wheezing 2. Cough 3. History of asthma - predniSONE (DELTASONE) 20 MG tablet; Take 1 tablet (20 mg total) by mouth 2 (two) times daily with a meal. .  Dispense: 14 tablet; Refill: 0 - DG Chest 2 View; Future - albuterol (PROVENTIL) (2.5 MG/3ML) 0.083% nebulizer solution 2.5 mg; Take 3 mLs (2.5 mg total) by nebulization once. - ipratropium (ATROVENT) nebulizer solution 0.5 mg; Take 2.5 mLs (0.5 mg total) by nebulization once. - Pt reports relief after breathing treatment. She has appt with her PCP next week. F/u with PCP.    Mercer Pod, PA-C  [facility] [place] 05/24/2016 11:43 AM

## 2016-05-24 NOTE — Patient Instructions (Addendum)
If you are not better in one week come back. Please stay well hydrated.   Thank you for coming in today. I hope you feel we met your needs.  Feel free to call [facility] if you have any questions or further requests.  Please consider signing up for MyChart if you do not already have it, as this is a great way to communicate with me.  Best,  Whitney McVey, PA-C   IF you received an x-ray today, you will receive an invoice from [facility] Radiology. Please contact [facility]iology at [phone] with questions or concerns regarding your invoice.   IF you received labwork today, you will receive an invoice from [place]. Please contact [facility] at [phone] with questions or concerns regarding your invoice.   Our billing staff will not be able to assist you with questions regarding bills from these companies.  You will be contacted with the lab results as soon as they are available. The fastest way to get your results is to activate your My Chart account. Instructions are located on the last page of this paperwork. If you have not heard from [place] regarding the results in 2 weeks, please contact this office.

## 2016-05-25 ENCOUNTER — Telehealth: Payer: Self-pay

## 2016-05-25 NOTE — Telephone Encounter (Signed)
Seen yesterday given prednisone and neb/inhale , will you rx cough med?

## 2016-05-25 NOTE — Telephone Encounter (Signed)
Pt is wanting to know if she can get something for a cough called in   Best number [phone]

## 2016-05-26 ENCOUNTER — Other Ambulatory Visit: Payer: Self-pay | Admitting: Family Medicine

## 2016-05-26 MED ORDER — HYDROCODONE-HOMATROPINE 5-1.5 MG/5ML PO SYRP: 5.0000 mL | ORAL_SOLUTION | Freq: Three times a day (TID) | ORAL | 0 refills | Status: DC | PRN

## 2016-05-26 NOTE — Telephone Encounter (Signed)
Pt called and has a bad cough would like an rx. Cant afford anything otc, I suggested mucinex dm. Will you give her something?

## 2016-05-26 NOTE — Progress Notes (Unsigned)
Will prescribe Hycodan cough syrup for persistent cough. Patient will need to return for care on Monday if cough doesn't improve.

## 2016-05-26 NOTE — Telephone Encounter (Signed)
Pt advised to pick up rx. 

## 2016-05-26 NOTE — Progress Notes (Unsigned)
Pt advised but called back to say its too expensive.  Delsym less than 10 dollars  Per pharmacist her insurance doesnot cover cough meds.  I suggested tea and honey and fluids. Come see [place] if not feeling better.

## 2016-06-12 ENCOUNTER — Other Ambulatory Visit: Payer: Self-pay | Admitting: Family Medicine

## 2016-06-14 ENCOUNTER — Emergency Department (HOSPITAL_COMMUNITY): Payer: Medicare (Managed Care)

## 2016-06-14 ENCOUNTER — Emergency Department (HOSPITAL_COMMUNITY)
Admission: EM | Admit: 2016-06-14 | Discharge: 2016-06-14 | Disposition: A | Discharge status: Home / Self Care | Payer: Medicare (Managed Care) | Attending: Emergency Medicine | Admitting: Emergency Medicine

## 2016-06-14 ENCOUNTER — Encounter (HOSPITAL_COMMUNITY): Payer: Self-pay | Admitting: Radiology

## 2016-06-14 DIAGNOSIS — R519 Headache, unspecified: Secondary | ICD-10-CM

## 2016-06-14 DIAGNOSIS — Z79899 Other long term (current) drug therapy: Secondary | ICD-10-CM | POA: Diagnosis not present

## 2016-06-14 DIAGNOSIS — Z7982 Long term (current) use of aspirin: Secondary | ICD-10-CM | POA: Insufficient documentation

## 2016-06-14 DIAGNOSIS — R51 Headache: Secondary | ICD-10-CM | POA: Diagnosis not present

## 2016-06-14 DIAGNOSIS — Z96652 Presence of left artificial knee joint: Secondary | ICD-10-CM | POA: Insufficient documentation

## 2016-06-14 DIAGNOSIS — J45909 Unspecified asthma, uncomplicated: Secondary | ICD-10-CM | POA: Diagnosis not present

## 2016-06-14 MED ORDER — HYDROMORPHONE HCL 1 MG/ML IJ SOLN
1.0000 mg | Freq: Once | INTRAMUSCULAR | Status: AC
  Administered 2016-06-14: 1 mg via INTRAMUSCULAR
  Filled 2016-06-14: qty 1

## 2016-06-14 NOTE — ED Triage Notes (Signed)
Pt c/o of headache pain 10/10 that worsens when she lays down. Pt reports having a CT scan approx. 3 weeks ago.

## 2016-06-14 NOTE — Discharge Instructions (Signed)
Follow-up with your doctor if any problems he can take your Ultram for headache if needed

## 2016-06-14 NOTE — ED Provider Notes (Signed)
[facility] DEPT Provider Note   CSN: [id number] Arrival date & time: 06/14/16  0807     History   Chief Complaint No chief complaint on file.   HPI [name] is a 76 y.o. female.  Patient states that she hit her head a few weeks ago and continues to have a headache. She's been taking Tylenol. She had no loss of consciousness when she hit her head    Headache   This is a recurrent problem. The current episode started more than 2 days ago. The problem occurs constantly. The problem has not changed since onset.The headache is associated with nothing. The pain is located in the occipital region. The quality of the pain is described as dull. The pain is at a severity of 6/10. The pain is moderate. The pain does not radiate.    Past Medical History:  Diagnosis Date  . Anemia 1990  . Anxiety   . Arthritis 2004  . Asthma 1995  . Colon polyp   . Complication of anesthesia 1966   first surgery she had- had a hard time waking up from surgery  . Diverticulosis 2003  . GERD (gastroesophageal reflux disease)     Patient Active Problem List   Diagnosis Date Noted  . Primary osteoarthritis of left knee 04/04/2016  . 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 Asthma 12/07/2011  . Arthritis 12/07/2011  . 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 Obesity  12/07/2011  . DIVERTICULOSIS-COLON 12/01/2008  . ABDOMINAL PAIN-RLQ 12/01/2008    Past Surgical History:  Procedure Laterality Date  . APPENDECTOMY  1966  . CATARACT EXTRACTION     Bilateral  . CHOLECYSTECTOMY  2004  . COLON SURGERY  2008  . COLOSTOMY    . COLOSTOMY TAKEDOWN    . DIAGNOSTIC LAPAROSCOPY     exploratory for endometriosis  . HEEL SPUR SURGERY     Right, Metal Plate in Heel  . [place]  2010  . NEPHRECTOMY Left 1779   As complication of partial colectomy for diverticulitis  . TONSILLECTOMY    . 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   Procedure: LEFT TOTAL KNEE ARTHROPLASTY;   Surgeon: Susa Day, MD;  Location: [facility];  Service: Orthopedics;  Laterality: Left;  Adductor Block    OB History    No data available       Home Medications    Prior to Admission medications   Medication Sig Start Date End Date Taking? Authorizing Provider  acetaminophen (TYLENOL) 650 MG CR tablet Take 1,300 mg by mouth at bedtime as needed for pain.   Yes Historical Provider, MD  albuterol (PROVENTIL HFA;VENTOLIN HFA) 108 (90 Base) MCG/ACT inhaler Inhale into the lungs every 6 (six) hours as needed for wheezing or shortness of breath.   Yes Historical Provider, MD  cetirizine (ZYRTEC) 10 MG tablet Take 10 mg by mouth daily.    Yes Historical Provider, MD  docusate sodium (COLACE) 100 MG capsule Take 1 capsule (100 mg total) by mouth 2 (two) times daily as needed for mild constipation. 04/04/16  Yes Susa Day, MD  Homeopathic Products [facility] DRY EYE RELIEF OP) Place 2 drops into both eyes 2 (two) times daily as needed (dryness).   Yes Historical Provider, MD  LORazepam (ATIVAN) 2 MG tablet Take 2 mg by mouth at bedtime.   Yes Historical Provider, MD  perphenazine (TRILAFON) 2 MG tablet Take 6 mg by mouth at bedtime. 08/24/15  Yes Historical Provider, MD  traMADol (ULTRAM) 50 MG tablet Take 50 mg by mouth daily as needed for moderate pain.    Yes Historical Provider, MD  amoxicillin-clavulanate (AUGMENTIN) 875-125 MG tablet Take 1 tablet by mouth 2 (two) times daily. Patient not taking: Reported on 06/14/2016 05/17/16   Sedalia Muta, FNP  aspirin EC 325 MG tablet Take 1 tablet (325 mg total) by mouth 2 (two) times daily after a meal. Patient not taking: Reported on 06/14/2016 04/04/16   Susa Day, MD  HYDROcodone-homatropine [facility]) 5-1.5 MG/5ML syrup Take 5 mLs by mouth every 8 (eight) hours as needed for cough. Patient not taking: Reported on 06/14/2016 05/26/16   Sedalia Muta, FNP  oxyCODONE-acetaminophen (PERCOCET) 5-325 MG tablet Take 1-2 tablets by  mouth every 4 (four) hours as needed for severe pain. Patient not taking: Reported on 05/24/2016 04/04/16   Susa Day, MD  polyethylene glycol [facility] Floria Raveling) packet Take 17 g by mouth daily. Patient not taking: Reported on 06/14/2016 04/04/16   Susa Day, MD  predniSONE (DELTASONE) 20 MG tablet Take 1 tablet (20 mg total) by mouth 2 (two) times daily with a meal. . Patient not taking: Reported on 06/14/2016 05/24/16   Gelene Mink McVey, PA-C    Family History Family History  Problem Relation Age of Onset  . Colon polyps Other     niece  . Diabetes Paternal Grandmother   . Diabetes Paternal Aunt   . Heart failure Mother   . Heart attack Father     Social History Social History  Substance Use Topics  . Smoking status: Never Smoker  . Smokeless tobacco: Never Used  . Alcohol use Yes     Comment: occassionally     Allergies   Haloperidol and Aripiprazole   Review of Systems Review of Systems  Constitutional: Negative for appetite change and fatigue.  HENT: Negative for congestion, ear discharge and sinus pressure.   Eyes: Negative for discharge.  Respiratory: Negative for cough.   Cardiovascular: Negative for chest pain.  Gastrointestinal: Negative for abdominal pain and diarrhea.  Genitourinary: Negative for frequency and hematuria.  Musculoskeletal: Negative for back pain.  Skin: Negative for rash.  Neurological: Positive for headaches. Negative for seizures.  Psychiatric/Behavioral: Negative for hallucinations.     Physical Exam Updated Vital Signs BP 157/99 (BP Location: Right Arm) Comment (BP Location): Right forearm  Pulse 81   Temp 97.7 F (36.5 C) (Oral)   SpO2 93%   Physical Exam  Constitutional: She is oriented to person, place, and time. She appears well-developed.  HENT:  Head: Normocephalic.  Eyes: Conjunctivae and EOM are normal. No scleral icterus.  Neck: Neck supple. No thyromegaly present.  Cardiovascular: Normal rate and  regular rhythm.  Exam reveals no gallop and no friction rub.   No murmur heard. Pulmonary/Chest: No stridor. She has no wheezes. She has no rales. She exhibits no tenderness.  Abdominal: She exhibits no distension. There is no tenderness. There is no rebound.  Musculoskeletal: Normal range of motion. She exhibits no edema.  Lymphadenopathy:    She has no cervical adenopathy.  Neurological: She is oriented to person, place, and time. She exhibits normal muscle tone. Coordination normal.  Skin: No rash noted. No erythema.  Psychiatric: She has a normal mood and affect. Her behavior is normal.     ED Treatments / Results  Labs (all labs ordered are listed, but only abnormal results are displayed) Labs Reviewed - No data to display  EKG  EKG Interpretation None  Radiology Ct Head Wo Contrast  Result Date: 06/14/2016 CLINICAL DATA:  Headache, recent fall, persistent headache since fall EXAM: CT HEAD WITHOUT CONTRAST TECHNIQUE: Contiguous axial images were obtained from the base of the skull through the vertex without intravenous contrast. COMPARISON:  05/10/2013 FINDINGS: Brain: No intracranial hemorrhage, mass effect or midline shift. Again noted mild periventricular and patchy subcortical white matter decreased attenuation probable due to chronic small vessel ischemic changes. No definite acute cortical infarction. No mass lesion is noted on this unenhanced scan. Ventricular size is stable from prior exam. No intraventricular hemorrhage. Vascular: No hyperdense vessel or unexpected calcification. Skull: Normal. Negative for fracture or focal lesion. Sinuses/Orbits: Probable small mucous retention cyst bilateral sphenoid sinus. There is mucosal thickening bilateral posterior aspect maxillary sinus left greater than right. Other: None IMPRESSION: No acute intracranial abnormality. Again noted mild periventricular and patchy subcortical white matter decreased attenuation consistent with  chronic small vessel ischemic changes. Paranasal sinuses disease as described above. No definite acute cortical infarction. Electronically Signed   By: Lahoma Crocker M.D.   On: 06/14/2016 09:37    Procedures Procedures (including critical care time)  Medications Ordered in ED Medications  HYDROmorphone (DILAUDID) injection 1 mg (1 mg Intramuscular Given 06/14/16 0844)     Initial Impression / Assessment and Plan / ED Course  I have reviewed the triage vital signs and the nursing notes.  Pertinent labs & imaging results that were available during my care of the patient were reviewed by me and considered in my medical decision making (see chart for details).     CT scan of the head was negative. Patient improved with IM Dilaudid. She will be sent home for follow-up with her family doctor and is instructed to take her Ultram if needed for her headaches  Final Clinical Impressions(s) / ED Diagnoses   Final diagnoses:  Headache behind the eyes    New Prescriptions New Prescriptions   No medications on file     Milton Ferguson, MD 06/14/16 1029

## 2016-06-15 ENCOUNTER — Telehealth: Payer: Self-pay | Admitting: General Practice

## 2016-06-15 MED ORDER — CETIRIZINE HCL 10 MG PO TABS: 10.0000 mg | ORAL_TABLET | Freq: Every day | ORAL | 2 refills | Status: DC

## 2016-06-15 NOTE — Telephone Encounter (Signed)
Pt is needing to get a refill on zyrtec   Best number [phone]

## 2016-06-19 DIAGNOSIS — Z96652 Presence of left artificial knee joint: Secondary | ICD-10-CM | POA: Insufficient documentation

## 2016-06-30 ENCOUNTER — Ambulatory Visit (INDEPENDENT_AMBULATORY_CARE_PROVIDER_SITE_OTHER): Payer: PRIVATE HEALTH INSURANCE | Admitting: Physician Assistant

## 2016-06-30 ENCOUNTER — Other Ambulatory Visit: Payer: Self-pay | Admitting: Physician Assistant

## 2016-06-30 VITALS — BP 131/78 | HR 93 | Temp 97.4°F | Resp 17 | Ht 63.0 in | Wt 210.0 lb

## 2016-06-30 DIAGNOSIS — K219 Gastro-esophageal reflux disease without esophagitis: Secondary | ICD-10-CM

## 2016-06-30 DIAGNOSIS — J4520 Mild intermittent asthma, uncomplicated: Secondary | ICD-10-CM

## 2016-06-30 DIAGNOSIS — R05 Cough: Secondary | ICD-10-CM | POA: Diagnosis not present

## 2016-06-30 DIAGNOSIS — R059 Cough, unspecified: Secondary | ICD-10-CM

## 2016-06-30 DIAGNOSIS — F419 Anxiety disorder, unspecified: Secondary | ICD-10-CM

## 2016-06-30 MED ORDER — RANITIDINE HCL 150 MG PO TABS: 150.0000 mg | ORAL_TABLET | Freq: Two times a day (BID) | ORAL | 0 refills | Status: DC

## 2016-06-30 MED ORDER — BENZONATATE 100 MG PO CAPS: 100.0000 mg | ORAL_CAPSULE | Freq: Three times a day (TID) | ORAL | 0 refills | Status: DC | PRN

## 2016-06-30 NOTE — Progress Notes (Signed)
Patient ID: [name], female    DOB: 10-25-1940, 76 y.o.   MRN: [id number]  PCP: Suzanna Obey, MD  Chief Complaint  Patient presents with  . Cough  . URI    Subjective:   Presents for evaluation of upper respiratory symptoms, including cough.  Seen initially on 05/17/2016 with 2-3 days of cough and 1 day of headache. From that visit: Patient reports generalized body aches, felt feverish last evening, cough is spasmodic at times to the point she can't stop. She has used her albuterol inhaler last evening to improve shortness of breath. Denies N&V or abdominal discomfort. Reports her most recent sequence of headaches started 3 days ago and she feels is associated with this illness. Reports her throat is sore due to dryness. Reports that she is drinking fluids regularly.   She wheezing on exam. Diagnosed with bronchitis and flu-like symptoms. Rx'd Augmentin, Tamiflu, oral prednisone.  Returned on 05/24/2016 with persistent cough. Today c/o cough. Productive with clear and sticky liquid. Some wheezing. Taking her inhaler - about 3 times this week.  Cough is worse when she is eating - this is not new.  Denies SOB  Dec 27 TK replacement.  H/o reflux - reports chronic cough, however this is different bc it is productive.  Had wheezing on exam again. CXR was normal. In office breathing treatment with some improvement. Repeat prednisone treatment.  Called 2/16 for cough medication.  Saw Dr. Doreene Nest on 2/19 to establish care and follow-up. Reported cough was improving, no sputum, dyspnea or fever. No wheezing on exam. No additional treatment provided for this issue, as it seemed to be resolving.  Cough never resolved completely, "I have a chronic cough."  Presented to the ED on 3/08 for terrible headache. Negative CT head. Improved with Dilaudid, sent home with Tramadol.  Called here on 3/09 for refill of cetirizine.  Cough worsened again last week. Unable to tell me  which day. Mostly dry, occasionally produces mucous. Wakes during the night with a cough "like a spasm." Bought a bag of 200 cough  Drops, but reports that they have disappeared. Drinking liquids helps. No nasal/sinus symptoms. Throat "just screams for something to relieve it." Not sore, just really irritated with the coughing. Has been hoarse, but that has resolved. Dry mouth is baseline. Believes that this is persistent from an episode of thrush last year. Has seen her dentist since then.  Asks for lorazepam to help her sleep at night, to relieve her anxiety. Was seeing Dr. Casimiro Needle for this issue, but no longer goes there. Her new PCP advised that she cannot prescribe it for her. Was hopeful that she could manage without it.  Review of Systems As above.    Patient Active Problem List   Diagnosis Date Noted  . Primary osteoarthritis of left knee 04/04/2016  . 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 Anxiety 11/15/2015  . Gastroesophageal reflux disease without esophagitis 11/15/2015  . Solitary right kidney 07/07/2015  . Urge incontinence of urine 07/07/2015  . Asthma 12/07/2011  . Arthritis 12/07/2011  . 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 Obesity  12/07/2011  . Incisional hernia 03/29/2011  . DIVERTICULOSIS-COLON 12/01/2008  . ABDOMINAL PAIN-RLQ 12/01/2008     Prior to Admission medications   Medication Sig Start Date End Date Taking? Authorizing Provider  acetaminophen (TYLENOL) 650 MG CR tablet Take 1,300 mg by mouth at bedtime as needed for pain.   Yes Historical Provider, MD  albuterol (PROVENTIL HFA;VENTOLIN HFA) 108 (90 Base) MCG/ACT inhaler Inhale into the lungs every 6 (six) hours as needed for wheezing or shortness of breath.   Yes Historical Provider, MD  cetirizine (ZYRTEC) 10 MG tablet Take 1 tablet (10 mg total) by mouth daily. 06/15/16  Yes Sedalia Muta, FNP  Homeopathic Products [facility] DRY EYE RELIEF OP) Place 2 drops into both  eyes 2 (two) times daily as needed (dryness).   Yes Historical Provider, MD  traMADol (ULTRAM) 50 MG tablet Take 50 mg by mouth daily as needed for moderate pain.    no Historical Provider, MD  LORazepam (ATIVAN) 2 MG tablet Take 2 mg by mouth at bedtime.   no Historical Provider, MD     Allergies  Allergen Reactions  . Haloperidol Anaphylaxis  . Aripiprazole Other (See Comments)    Bad thoughts        Objective:  Physical Exam  Constitutional: She is oriented to person, place, and time. She appears well-developed and well-nourished. She is active and cooperative. No distress.  BP 131/78   Pulse 93   Temp 97.4 F (36.3 C) (Oral)   Resp 17   Ht 5\' 3"  (1.6 m)   Wt 210 lb (95.3 kg)   SpO2 98%   BMI 37.20 kg/m   HENT:  Head: Normocephalic and atraumatic.  Right Ear: Hearing normal.  Left Ear: Hearing normal.  Eyes: Conjunctivae are normal. No scleral icterus.  Neck: Normal range of motion. Neck supple. No thyromegaly present.  Cardiovascular: Normal rate, regular rhythm and normal heart sounds.   Pulses:      Radial pulses are 2+ on the right side, and 2+ on the left side.  Pulmonary/Chest: Effort normal and breath sounds normal.  Lymphadenopathy:       Head (right side): No tonsillar, no preauricular, no posterior auricular and no occipital adenopathy present.       Head (left side): No tonsillar, no preauricular, no posterior auricular and no occipital adenopathy present.    She has no cervical adenopathy.       Right: No supraclavicular adenopathy present.       Left: No supraclavicular adenopathy present.  Neurological: She is alert and oriented to person, place, and time. No sensory deficit.  Skin: Skin is warm, dry and intact. No rash noted. No cyanosis or erythema. Nails show no clubbing.  Psychiatric: She has a normal mood and affect. Her speech is normal and behavior is normal.           Assessment & Plan:   Problem List Items Addressed This Visit     Asthma    Possible cause of her current symptoms, or possibly exacerbated by other URI symptoms. PRN albuterol. Follow-up with PCP or return here if needed.      Anxiety    Advised she would need to establish with psychiatry or discuss this further with her PCP. Her request for alprazolam today was declined.      Gastroesophageal reflux disease without esophagitis   Relevant Medications   ranitidine (ZANTAC) 150 MG tablet    Other Visit Diagnoses    Cough    -  Primary   possibly viral URI, though asthma and GERD/LPR are also possible. Supportive care. RTC if not improving.   Relevant Medications   benzonatate (TESSALON) 100 MG capsule       Return if symptoms worsen or fail to improve.   Fara Chute, PA-C Primary Care at [facility]

## 2016-06-30 NOTE — Patient Instructions (Addendum)
Try OTC Biotene to help with the dry mouth. Also consider sucking on ice chips.  Contact Dr. Doreene Nest regarding the lorazepam. If she cannot prescribe it to you, she can help you get in with another specialist.    IF you received an x-ray today, you will receive an invoice from [facility]. Please contact [facility] at [phone] with questions or concerns regarding your invoice.   IF you received labwork today, you will receive an invoice from [facility]. Please contact [facility] at [phone] with questions or concerns regarding your invoice.   Our billing staff will not be able to assist you with questions regarding bills from these companies.  You will be contacted with the lab results as soon as they are available. The fastest way to get your results is to activate your My Chart account. Instructions are located on the last page of this paperwork. If you have not heard from [place] regarding the results in 2 weeks, please contact this office.

## 2016-07-12 NOTE — Assessment & Plan Note (Signed)
Possible cause of her current symptoms, or possibly exacerbated by other URI symptoms. PRN albuterol. Follow-up with PCP or return here if needed.

## 2016-07-12 NOTE — Assessment & Plan Note (Signed)
Advised she would need to establish with psychiatry or discuss this further with her PCP. Her request for alprazolam today was declined.

## 2016-08-06 DIAGNOSIS — Z8719 Personal history of other diseases of the digestive system: Secondary | ICD-10-CM | POA: Insufficient documentation

## 2016-09-19 ENCOUNTER — Encounter: Payer: Self-pay | Admitting: Internal Medicine

## 2016-09-26 ENCOUNTER — Encounter: Payer: Self-pay | Admitting: Internal Medicine

## 2016-10-17 ENCOUNTER — Other Ambulatory Visit: Payer: Self-pay | Admitting: Family Medicine

## 2016-10-23 ENCOUNTER — Other Ambulatory Visit: Payer: Self-pay | Admitting: *Deleted

## 2016-10-23 DIAGNOSIS — J4520 Mild intermittent asthma, uncomplicated: Secondary | ICD-10-CM

## 2016-10-23 MED ORDER — CETIRIZINE HCL 10 MG PO TABS: 10.0000 mg | ORAL_TABLET | Freq: Every day | ORAL | 2 refills | Status: DC

## 2016-10-23 NOTE — Telephone Encounter (Signed)
PT CALLING FOR A REFILL ON ZYRTEC

## 2016-10-29 ENCOUNTER — Ambulatory Visit (INDEPENDENT_AMBULATORY_CARE_PROVIDER_SITE_OTHER): Payer: PRIVATE HEALTH INSURANCE | Admitting: Physician Assistant

## 2016-10-29 ENCOUNTER — Encounter: Payer: Self-pay | Admitting: Physician Assistant

## 2016-10-29 VITALS — BP 113/76 | HR 85 | Resp 18 | Ht 63.0 in | Wt 214.0 lb

## 2016-10-29 DIAGNOSIS — R82998 Other abnormal findings in urine: Secondary | ICD-10-CM

## 2016-10-29 DIAGNOSIS — R8299 Other abnormal findings in urine: Secondary | ICD-10-CM

## 2016-10-29 DIAGNOSIS — R109 Unspecified abdominal pain: Secondary | ICD-10-CM

## 2016-10-29 LAB — POCT URINALYSIS DIP (MANUAL ENTRY)
Bilirubin, UA: NEGATIVE
GLUCOSE UA: NEGATIVE mg/dL
Ketones, POC UA: NEGATIVE mg/dL
Nitrite, UA: NEGATIVE
PH UA: 7 (ref 5.0–8.0)
Protein Ur, POC: NEGATIVE mg/dL
RBC UA: NEGATIVE
SPEC GRAV UA: 1.01 (ref 1.010–1.025)
UROBILINOGEN UA: 0.2 U/dL

## 2016-10-29 LAB — POCT CBC
GRANULOCYTE PERCENT: 71.4 % (ref 37–80)
HCT, POC: 42.8 % (ref 37.7–47.9)
Hemoglobin: 14.6 g/dL (ref 12.2–16.2)
Lymph, poc: 1.7 (ref 0.6–3.4)
MCH: 29.6 pg (ref 27–31.2)
MCHC: 34.1 g/dL (ref 31.8–35.4)
MCV: 86.8 fL (ref 80–97)
MID (cbc): 0.4 (ref 0–0.9)
MPV: 7.8 fL (ref 0–99.8)
PLATELET COUNT, POC: 267 10*3/uL (ref 142–424)
POC Granulocyte: 5.4 (ref 2–6.9)
POC LYMPH PERCENT: 22.7 %L (ref 10–50)
POC MID %: 5.9 %M (ref 0–12)
RBC: 4.92 M/uL (ref 4.04–5.48)
RDW, POC: 13 %
WBC: 7.5 10*3/uL (ref 4.6–10.2)

## 2016-10-29 MED ORDER — CIPROFLOXACIN HCL 500 MG PO TABS: 500.0000 mg | ORAL_TABLET | Freq: Two times a day (BID) | ORAL | 0 refills | Status: AC

## 2016-10-29 NOTE — Progress Notes (Signed)
11/02/2016 1:15 PM   DOB: 29-Nov-1940 / MRN: [id number]  SUBJECTIVE:  [name] is a 76 y.o. female presenting for about the left anterior flank that started 3-4 days ago and tells me the pain is under "a hernia."  States the pain radiates to her back. She is moving her bowels and this is non bloody.  Urinating normal for her.  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  Her kidney was removed due to complications from diverticulosis surgery. She also has mesh in the abdomen which was placed in 2010.   No new medications.  She has a history diverticulosis. She has a history of endometriosis and has had surgery for this as well. She is status post cholecystectomy. Most recent CT abdomen is documented in [facility]. She tells me that she has had a heart attack in the past and is followed by cards for this.   She is allergic to haloperidol and aripiprazole.   She  has a past medical history of Anemia (1990); Anxiety; Arthritis (2004); Asthma (1995); Colon polyp; Complication of anesthesia (1966); Diverticulosis (2003); and GERD (gastroesophageal reflux disease).    She  reports that she has never smoked. She has never used smokeless tobacco. She reports that she drinks alcohol. She reports that she does not use drugs. She  reports that she does not currently engage in sexual activity. The patient  has a past surgical history that includes Appendectomy (1966); Cholecystectomy (2004); Incisional hernia repair (2010); Colon surgery (2008); Tonsillectomy; Cataract extraction; Nephrectomy (Left, 2008); Colostomy; Colostomy takedown; Heel spur surgery; Diagnostic laparoscopy; and Total knee arthroplasty (Left, 04/04/2016).  Her family history includes Colon polyps in her other; Diabetes in her paternal aunt and paternal grandmother; Heart attack in her father; Heart failure in her mother.  Review of Systems  Constitutional: Negative for chills and fever.  Respiratory: Negative for  cough.   Cardiovascular: Negative for chest pain.  Gastrointestinal: Positive for abdominal pain. Negative for blood in stool, constipation, diarrhea, heartburn, melena, nausea and vomiting.  Genitourinary: Positive for flank pain.  Musculoskeletal: Negative for myalgias.  Skin: Negative for itching and rash.  Neurological: Negative for dizziness.    The problem list and medications were reviewed and updated by myself where necessary and exist elsewhere in the encounter.   OBJECTIVE:  BP 113/76 (BP Location: Right Arm, Patient Position: Sitting, Cuff Size: Large)   Pulse 85   Resp 18   Ht '5\' 3"'  (1.6 m)   Wt 214 lb (97.1 kg)   SpO2 93%   BMI 37.91 kg/m   Physical Exam  Constitutional: She is active.  Non-toxic appearance.  HENT:  Right Ear: Hearing, tympanic membrane, external ear and ear canal normal.  Left Ear: Hearing, tympanic membrane, external ear and ear canal normal.  Nose: Nose normal. Right sinus exhibits no maxillary sinus tenderness and no frontal sinus tenderness. Left sinus exhibits no maxillary sinus tenderness and no frontal sinus tenderness.  Mouth/Throat: Uvula is midline, oropharynx is clear and moist and mucous membranes are normal. Mucous membranes are not dry. No oropharyngeal exudate, posterior oropharyngeal edema or tonsillar abscesses.  Cardiovascular: Normal rate, regular rhythm, S1 normal, S2 normal, normal heart sounds and intact distal pulses.  Exam reveals no gallop, no friction rub and no decreased pulses.   No murmur heard. Pulmonary/Chest: Effort normal. No stridor. No tachypnea. No respiratory distress. She has no wheezes. She has no rales.  Abdominal: She exhibits no distension.  Musculoskeletal: She exhibits no edema.  Lymphadenopathy:       Head (right side): No submandibular and no tonsillar adenopathy present.       Head (left side): No submandibular and no tonsillar adenopathy present.    She has no cervical adenopathy.  Neurological: She  is alert.  Skin: Skin is warm and dry. She is not diaphoretic. No pallor.   No results found for this or any previous visit (from the past 72 hour(s)).  No results found.  ASSESSMENT AND PLAN:  Abbi was seen today for flank pain.  Diagnoses and all orders for this visit:  Flank pain: Given problem 2 will cover with cipro and culture urine.  Remainder of work up is reassuring.  -     POCT CBC -     POCT urinalysis dipstick -     CMP14+EGFR -     Lipid Panel  Urine leukocytes -     ciprofloxacin (CIPRO) 500 MG tablet; Take 1 tablet (500 mg total) by mouth 2 (two) times daily. -     Urine Culture    The patient is advised to call or return to clinic if she does not see an improvement in symptoms, or to seek the care of the closest emergency department if she worsens with the above plan.   Philis Fendt, MHS, PA-C Primary Care at [facility] 11/02/2016 1:15 PM

## 2016-10-29 NOTE — Patient Instructions (Addendum)
It appears that you have a UTI.  Lets hold on imaging for now and treat that problem.  If you continue to have pain or seem to be getting worse then come back to the office.     IF you received an x-ray today, you will receive an invoice from [facility]. Please contact [facility] at [phone] with questions or concerns regarding your invoice.   IF you received labwork today, you will receive an invoice from [facility]. Please contact [facility] at [phone] with questions or concerns regarding your invoice.   Our billing staff will not be able to assist you with questions regarding bills from these companies.  You will be contacted with the lab results as soon as they are available. The fastest way to get your results is to activate your My Chart account. Instructions are located on the last page of this paperwork. If you have not heard from [place] regarding the results in 2 weeks, please contact this office.

## 2016-10-30 LAB — CMP14+EGFR
ALBUMIN: 3.9 g/dL (ref 3.5–4.8)
ALT: 17 IU/L (ref 0–32)
AST: 15 IU/L (ref 0–40)
Albumin/Globulin Ratio: 1.9 (ref 1.2–2.2)
Alkaline Phosphatase: 86 IU/L (ref 39–117)
BUN / CREAT RATIO: 12 (ref 12–28)
BUN: 13 mg/dL (ref 8–27)
Bilirubin Total: 0.4 mg/dL (ref 0.0–1.2)
CO2: 23 mmol/L (ref 20–29)
CREATININE: 1.09 mg/dL — AB (ref 0.57–1.00)
Calcium: 9 mg/dL (ref 8.7–10.3)
Chloride: 101 mmol/L (ref 96–106)
GFR calc non Af Amer: 50 mL/min/{1.73_m2} — ABNORMAL LOW (ref >59)
GFR, EST AFRICAN AMERICAN: 58 mL/min/{1.73_m2} — AB (ref >59)
GLOBULIN, TOTAL: 2.1 g/dL (ref 1.5–4.5)
GLUCOSE: 101 mg/dL — AB (ref 65–99)
Potassium: 4 mmol/L (ref 3.5–5.2)
SODIUM: 139 mmol/L (ref 134–144)
TOTAL PROTEIN: 6 g/dL (ref 6.0–8.5)

## 2016-10-30 LAB — LIPID PANEL
CHOL/HDL RATIO: 3.2 ratio (ref 0.0–4.4)
Cholesterol, Total: 139 mg/dL (ref 100–199)
HDL: 43 mg/dL (ref >39)
LDL CALC: 68 mg/dL (ref 0–99)
Triglycerides: 141 mg/dL (ref 0–149)
VLDL CHOLESTEROL CAL: 28 mg/dL (ref 5–40)

## 2016-10-31 LAB — URINE CULTURE

## 2016-11-01 ENCOUNTER — Encounter: Payer: Self-pay | Admitting: Physician Assistant

## 2016-11-01 ENCOUNTER — Ambulatory Visit (INDEPENDENT_AMBULATORY_CARE_PROVIDER_SITE_OTHER): Payer: PRIVATE HEALTH INSURANCE

## 2016-11-01 ENCOUNTER — Telehealth: Payer: Self-pay

## 2016-11-01 ENCOUNTER — Ambulatory Visit (INDEPENDENT_AMBULATORY_CARE_PROVIDER_SITE_OTHER): Payer: PRIVATE HEALTH INSURANCE | Admitting: Physician Assistant

## 2016-11-01 VITALS — BP 125/81 | HR 94 | Temp 97.6°F | Resp 16 | Ht 62.5 in | Wt 211.8 lb

## 2016-11-01 DIAGNOSIS — K5900 Constipation, unspecified: Secondary | ICD-10-CM | POA: Diagnosis not present

## 2016-11-01 DIAGNOSIS — R109 Unspecified abdominal pain: Secondary | ICD-10-CM

## 2016-11-01 LAB — POCT CBC
Granulocyte percent: 72.2 %G (ref 37–80)
HCT, POC: 43.4 % (ref 37.7–47.9)
Hemoglobin: 14.8 g/dL (ref 12.2–16.2)
Lymph, poc: 1.2 (ref 0.6–3.4)
MCH, POC: 30 pg (ref 27–31.2)
MCHC: 34.1 g/dL (ref 31.8–35.4)
MCV: 87.9 fL (ref 80–97)
MID (cbc): 0.5 (ref 0–0.9)
MPV: 7.4 fL (ref 0–99.8)
POC Granulocyte: 4.5 (ref 2–6.9)
POC LYMPH PERCENT: 19.2 %L (ref 10–50)
POC MID %: 8.6 % (ref 0–12)
Platelet Count, POC: 238 10*3/uL (ref 142–424)
RBC: 4.94 M/uL (ref 4.04–5.48)
RDW, POC: 13.3 %
WBC: 6.2 10*3/uL (ref 4.6–10.2)

## 2016-11-01 MED ORDER — POLYETHYLENE GLYCOL 3350 17 GM/SCOOP PO POWD: 17.0000 g | Freq: Two times a day (BID) | ORAL | 1 refills | Status: DC | PRN

## 2016-11-01 NOTE — Telephone Encounter (Signed)
-----   Message from Tereasa Coop, PA-C sent at 11/01/2016  9:45 AM EDT ----- Culture is negative. Please call her and find out how she's feeling. If she still feels poorly I will give her a call today at some point. Thank you Mazel Villela.

## 2016-11-01 NOTE — Patient Instructions (Addendum)
3 keys to help constipation: fluids, fiber and fitness.  Start drinking 1-2 liters of water daily.  Start getting regular exercise.  See below for instructions.  Come back and see me in 1 week.   For constipation   Make sure you are drinking enough water daily! Make sure you are getting enough fiber in your diet - this will make you regular - you can eat high fiber foods or use metamucil as a supplement - it is really important to drink enough water when using fiber supplements.  If your stools are hard or are formed balls or you have to strain a stool softener will help - use colace 2-3 capsule a day  For gentle treatment of constipation Use Miralax 1-2 capfuls a day until your stools are soft and regular and then decrease the usage - you can use this daily  For more aggressive treatment of constipation Use 4 capfuls of Colace and 6 doses of Miralax and drink it in 2 hours - this should result in several watery stools - if it does not repeat the next day and then go to daily miralax for a week to make sure your bowels are clean and retrained to work properly   Constipation, Adult Constipation is when a person:  Poops (has a bowel movement) fewer times in a week than normal.  Has a hard time pooping.  Has poop that is dry, hard, or bigger than normal.  Follow these instructions at home: Eating and drinking   Eat foods that have a lot of fiber, such as: ? Fresh fruits and vegetables. ? Whole grains. ? Beans.  Eat less of foods that are high in fat, low in fiber, or overly processed, such as: ? [place] fries. ? Hamburgers. ? Cookies. ? Candy. ? Soda.  Drink enough fluid to keep your pee (urine) clear or pale yellow. General instructions  Exercise regularly or as told by your doctor.  Go to the restroom when you feel like you need to poop. Do not hold it in.  Take over-the-counter and prescription medicines only as told by your doctor. These include any fiber  supplements.  Do pelvic floor retraining exercises, such as: ? Doing deep breathing while relaxing your lower belly (abdomen). ? Relaxing your pelvic floor while pooping.  Watch your condition for any changes.  Keep all follow-up visits as told by your doctor. This is important. Contact a doctor if:  You have pain that gets worse.  You have a fever.  You have not pooped for 4 days.  You throw up (vomit).  You are not hungry.  You lose weight.  You are bleeding from the anus.  You have thin, pencil-like poop (stool). Get help right away if:  You have a fever, and your symptoms suddenly get worse.  You leak poop or have blood in your poop.  Your belly feels hard or bigger than normal (is bloated).  You have very bad belly pain.  You feel dizzy or you faint. This information is not intended to replace advice given to you by your health care provider. Make sure you discuss any questions you have with your health care provider. Document Released: 09/12/2007 Document Revised: 10/14/2015 Document Reviewed: 09/14/2015 Elsevier Interactive Patient Education  2017 [employer].   IF you received an x-ray today, you will receive an invoice from [facility]. Please contact [facility] at [phone] with questions or concerns regarding your invoice.   IF you received labwork today, you  will receive an invoice from [facility]. Please contact [facility] at [phone] with questions or concerns regarding your invoice.   Our billing staff will not be able to assist you with questions regarding bills from these companies.  You will be contacted with the lab results as soon as they are available. The fastest way to get your results is to activate your My Chart account. Instructions are located on the last page of this paperwork. If you have not heard from [place] regarding the results in 2 weeks, please contact this office.

## 2016-11-01 NOTE — Telephone Encounter (Signed)
Call placed to patient as requested, no answer on patient phone. Left message for patient to return call to office at her convenience./ S.Nalda Shackleford,CMA

## 2016-11-01 NOTE — Progress Notes (Signed)
[name]  MRN: [id number] DOB: Jun 17, 1940  PCP: Katherina Mires, MD  Subjective:  Pt is a 76 year old female PMH urge incontinence, asthma, GERD, arthritis, obesity, anxiety who presents to clinic for f/u left flank pain.  C/o pain under hernia x 1 week.   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  Her kidney was removed due to complications from diverticulosis surgery. She also has mesh in the abdomen which was placed in 2010.    Today she endorses 10/10 pain. No bowel movement in 3 days.  She is constipated. She endorses constipation then diarrhea x several years. She does not take fiber supplement. She is still passing gas - last flatulence last night.  She was here 7/23 for the same c/c. UA showed large leukocytes. Rx Ciprofloxacin 500mg  bid. Urine culture came back negative. She is still taking antibiotics.   Denies n/v, fever, chills, blood in stool.   2008 surgery for diverticulitis which resulted in damaged left kidney which she had removed.   Review of Systems  Constitutional: Negative for chills, diaphoresis and fever.  Gastrointestinal: Positive for abdominal pain, constipation and diarrhea. Negative for abdominal distention, anal bleeding, blood in stool, nausea, rectal pain and vomiting.  Genitourinary: Positive for flank pain. Negative for dysuria, frequency and urgency.    Patient Active Problem List   Diagnosis Date Noted  . Primary osteoarthritis of left knee 04/04/2016  . 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 Anxiety 11/15/2015  . Gastroesophageal reflux disease without esophagitis 11/15/2015  . Solitary right kidney 07/07/2015  . Urge incontinence of urine 07/07/2015  . Asthma 12/07/2011  . Arthritis 12/07/2011  . 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 Obesity  12/07/2011  . Incisional hernia 03/29/2011  . DIVERTICULOSIS-COLON 12/01/2008  . ABDOMINAL PAIN-RLQ 12/01/2008    Current Outpatient Prescriptions on  File Prior to Visit  Medication Sig Dispense Refill  . acetaminophen (TYLENOL) 650 MG CR tablet Take 1,300 mg by mouth at bedtime as needed for pain.    Marland Kitchen albuterol (PROVENTIL HFA;VENTOLIN HFA) 108 (90 Base) MCG/ACT inhaler Inhale into the lungs every 6 (six) hours as needed for wheezing or shortness of breath.    . cetirizine (ZYRTEC) 10 MG tablet Take 1 tablet (10 mg total) by mouth daily. 30 tablet 2  . ciprofloxacin (CIPRO) 500 MG tablet Take 1 tablet (500 mg total) by mouth 2 (two) times daily. 10 tablet 0  . ranitidine (ZANTAC) 150 MG tablet Take 1 tablet (150 mg total) by mouth 2 (two) times daily. 60 tablet 0  . traMADol (ULTRAM) 50 MG tablet Take 50 mg by mouth daily as needed for moderate pain.     . Homeopathic Products (SIMILASAN DRY EYE RELIEF OP) Place 2 drops into both eyes 2 (two) times daily as needed (dryness).    . LORazepam (ATIVAN) 2 MG tablet Take 2 mg by mouth at bedtime.     No current facility-administered medications on file prior to visit.     Allergies  Allergen Reactions  . Haloperidol Anaphylaxis  . Aripiprazole Other (See Comments)    Bad thoughts      Objective:  BP 125/81   Pulse 94   Temp 97.6 F (36.4 C) (Oral)   Resp 16   Ht 5' 2.5" (1.588 m)   Wt 211 lb 12.8 oz (96.1 kg)   SpO2 100%   BMI 38.12 kg/m   Physical Exam  Constitutional: She  is oriented to person, place, and time and well-developed, well-nourished, and in no distress. No distress.  Cardiovascular: Normal rate, regular rhythm and normal heart sounds.   Abdominal: Soft. Normal appearance and bowel sounds are normal. She exhibits mass (left side). There is generalized tenderness. There is no rigidity, no rebound, no guarding and no CVA tenderness.  Central obesity  Neurological: She is alert and oriented to person, place, and time. GCS score is 15.  Skin: Skin is warm and dry.  Psychiatric: Mood, memory, affect and judgment normal.  Vitals reviewed.   Dg Abd 2 Views  Result  Date: 11/01/2016 CLINICAL DATA:  LEFT side abdominal pain for 1 week, no bowel movement for 4 days, history GERD, diverticulosis EXAM: ABDOMEN - 2 VIEW COMPARISON:  03/25/2014 FINDINGS: Surgical clips RIGHT upper quadrant question cholecystectomy. Increased stool in rectum and cecum. No evidence of bowel obstruction or dilatation. No bowel wall thickening. Bones diffusely demineralized with degenerative disc and facet disease changes lumbar spine. Scattered pelvic phleboliths. IMPRESSION: Increased stool in rectum and cecum. Electronically Signed   By: Lavonia Dana M.D.   On: 11/01/2016 11:15   Results for orders placed or performed in visit on 11/01/16  POCT CBC  Result Value Ref Range   WBC 6.2 4.6 - 10.2 K/uL   Lymph, poc 1.2 0.6 - 3.4   POC LYMPH PERCENT 19.2 10 - 50 %L   MID (cbc) 0.5 0 - 0.9   POC MID % 8.6 0 - 12 %M   POC Granulocyte 4.5 2 - 6.9   Granulocyte percent 72.2 37 - 80 %G   RBC 4.94 4.04 - 5.48 M/uL   Hemoglobin 14.8 12.2 - 16.2 g/dL   HCT, POC 43.4 37.7 - 47.9 %   MCV 87.9 80 - 97 fL   MCH, POC 30.0 27 - 31.2 pg   MCHC 34.1 31.8 - 35.4 g/dL   RDW, POC 13.3 %   Platelet Count, POC 238 142 - 424 K/uL   MPV 7.4 0 - 99.8 fL    Assessment and Plan :  1. Abdominal pain, unspecified abdominal location 2. Constipation, unspecified constipation type - POCT CBC - DG Abd 2 Views; Future - polyethylene glycol powder (GLYCOLAX/MIRALAX) powder; Take 17 g by mouth 2 (two) times daily as needed.  Dispense: 578 g; Refill: 1 - This is pt's 2nd OV for this problem. Last OV treated for UTI - negative urine culture. She is still taking antibiotics at this time, however her condition has not improved. Her vital signs and WBC count are wnl. Not concerned for acute process at this time. X-ray reveals large stool burden. When discussing x-ray results with pt she states "well I'm not surprised, it feels like I have a stool baby". Will treat with Miralax. Encouraged increased exercise and fluids.  RTC in 1 week for f/u. Consider abdominal CT if pain persists.   Mercer Pod, PA-C  [facility] 11/01/2016 10:40 AM

## 2016-11-08 ENCOUNTER — Ambulatory Visit: Payer: PRIVATE HEALTH INSURANCE | Admitting: Physician Assistant

## 2016-11-26 ENCOUNTER — Ambulatory Visit (INDEPENDENT_AMBULATORY_CARE_PROVIDER_SITE_OTHER): Payer: PRIVATE HEALTH INSURANCE

## 2016-11-26 ENCOUNTER — Encounter: Payer: Self-pay | Admitting: Urgent Care

## 2016-11-26 ENCOUNTER — Ambulatory Visit (INDEPENDENT_AMBULATORY_CARE_PROVIDER_SITE_OTHER): Payer: PRIVATE HEALTH INSURANCE | Admitting: Urgent Care

## 2016-11-26 VITALS — BP 138/82 | HR 73 | Temp 98.2°F | Resp 18 | Ht 63.0 in | Wt 213.0 lb

## 2016-11-26 DIAGNOSIS — M47812 Spondylosis without myelopathy or radiculopathy, cervical region: Secondary | ICD-10-CM | POA: Diagnosis not present

## 2016-11-26 DIAGNOSIS — M79622 Pain in left upper arm: Secondary | ICD-10-CM

## 2016-11-26 DIAGNOSIS — M542 Cervicalgia: Secondary | ICD-10-CM

## 2016-11-26 MED ORDER — TRAMADOL-ACETAMINOPHEN 37.5-325 MG PO TABS: 1.0000 | ORAL_TABLET | Freq: Four times a day (QID) | ORAL | 0 refills | Status: DC | PRN

## 2016-11-26 MED ORDER — CYCLOBENZAPRINE HCL 5 MG PO TABS: 5.0000 mg | ORAL_TABLET | Freq: Every day | ORAL | 1 refills | Status: DC

## 2016-11-26 NOTE — Progress Notes (Signed)
MRN: [id number] DOB: 18-Dec-1940  Subjective:   [name] is a 76 y.o. female presenting for chief complaint of Fall (last Friday left shoulder and back pain )  Reports suffering a fall 4 days ago. She fell from trying to open a door that was stuck. She landed on her back onto a carpeted floor, had a very difficult time getting up and needed help getting back up. She has since developed neck pain that radiates to her left shoulder. Her neck pain is intermittent, sharp, worse with turning her head, has radicular pain into her left shoulder and tricep. Has tried Tramadol with good relief. Denies confusion, head trauma, dizziness, weakness, numbness or tingling. Of note, patient has been diagnosed rotator cuff issues in both shoulders prior to her fall. Patient did have a CT neck on 05/10/2016 that showed, "Moderate multilevel degenerative spondylolysis, greatest at C5-6 and C6-7."   [name] has a current medication list which includes the following prescription(s): acetaminophen, albuterol, cetirizine, homeopathic products, polyethylene glycol powder, ranitidine, tramadol, and lorazepam. Also is allergic to haloperidol and aripiprazole.  [name]  has a past medical history of Anemia (1990); Anxiety; Arthritis (2004); Asthma (1995); Colon polyp; Complication of anesthesia (1966); Diverticulosis (2003); and GERD (gastroesophageal reflux disease). 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  Objective:   Vitals: BP 138/82   Pulse 73   Temp 98.2 F (36.8 C) (Oral)   Resp 18   Ht 5\' 3"  (1.6 m)   Wt 213 lb (96.6 kg)   SpO2 96%   BMI 37.73 kg/m   Physical Exam  Constitutional: She is oriented to person, place,  and time. She appears well-developed and well-nourished.  Cardiovascular: Normal rate.   Pulmonary/Chest: Effort normal.  Musculoskeletal:       Left shoulder: She exhibits decreased range of motion (patient is compensating with her trapezius upon lifting her left shoulder). She exhibits no tenderness, no bony tenderness, no swelling, no effusion, no deformity, no spasm and normal strength.       Cervical back: She exhibits tenderness (with lateral flexion to the right) and spasm (significant spasm over left trapezius). She exhibits normal range of motion, no bony tenderness, no swelling, no edema and no deformity.  Neurological: She is alert and oriented to person, place, and time. She displays normal reflexes.  Skin: Skin is warm and dry.  Psychiatric: She has a normal mood and affect.   Dg Cervical Spine Complete  Result Date: 11/26/2016 CLINICAL DATA:  Neck pain after a fall. EXAM: CERVICAL SPINE - COMPLETE 4+ VIEW COMPARISON:  CT 05/10/2016 FINDINGS: Bones are diffusely demineralized. No evidence for an acute fracture. Loss of disc height is most advanced at C5-6 and C6-7 with there is associated endplate spurring. The facets are well aligned bilaterally. No prevertebral soft tissue swelling. IMPRESSION: Degenerative changes without acute bony abnormality. Electronically Signed   By: Misty Stanley M.D.   On: 11/26/2016 14:51   Assessment and Plan :   1. Neck pain 2. Left upper arm pain 3. Osteoarthritis of cervical spine, unspecified spinal osteoarthritis complication status - Will manage conservatively given physical exam findings, radiology report and the fact that tramadol is helping patient very much. I offered patient Ultracet. Counseled patient on potential for adverse effects with medications prescribed today, patient verbalized understanding. She  will use stool softener, laxative if she experiences constipation with Ultracet. Return-to-clinic precautions discussed, patient verbalized  understanding. Otherwise, follow up as needed.  [name], PA-C [facility] [phone] 11/26/2016  2:20 PM

## 2016-11-26 NOTE — Patient Instructions (Addendum)
Acetaminophen; Tramadol tablets What is this medicine? ACETAMINOPHEN; TRAMADOL (a set a MEE noe fen; TRA ma dole) is a pain reliever. It is used to treat short term moderate pain. This medicine may be used for other purposes; ask your health care provider or pharmacist if you have questions. COMMON BRAND NAME(S): Ultracet What should I tell my health care provider before I take this medicine? They need to know if you have any of these conditions: -brain tumor -depression -drug abuse or addiction -head injury -if you often drink alcohol -kidney disease or trouble passing urine -liver disease -lung disease, asthma, or breathing problems -seizures or epilepsy -suicidal thoughts, plans, or attempt; a previous suicide attempt by you or a family member -an unusual or allergic reaction to acetaminophen, tramadol, codeine, other opioid analgesics, other medicines, foods, dyes, or preservatives -pregnant or trying to get pregnant -breast-feeding How should I use this medicine? Take this medicine by mouth with a full glass of water. Follow the directions on the prescription label. If the medicine upsets your stomach, take it with food or milk. Do not take your medicine more often than directed. A special MedGuide will be given to you by the pharmacist with each prescription and refill. Be sure to read this information carefully each time. Talk to your pediatrician regarding the use of this medicine in children. Special care may be needed. Overdosage: If you think you have taken too much of this medicine contact a poison control center or emergency room at once. NOTE: This medicine is only for you. Do not share this medicine with others. What if I miss a dose? If you miss a dose, take it as soon as you can. If it is almost time for your next dose, take only that dose. Do not take double or extra doses. What may interact with this medicine? Do not take this medication with any of the following  medicines: -MAOIs like Carbex, Eldepryl, Marplan, Nardil, and Parnate This medicine may also interact with the following medications: -alcohol -antihistamines for allergy, cough and cold -certain medicines for anxiety or sleep -certain medicines for depression like amitriptyline, fluoxetine, sertraline -certain medicines for migraine headache like almotriptan, eletriptan, frovatriptan, naratriptan, rizatriptan, sumatriptan, zolmitriptan -certain medicines for seizures like carbamazepine, oxcarbazepine, phenobarbital, primidone -certain medicines that treat or prevent blood clots like warfarin -digoxin -furazolidone -general anesthetics like halothane, isoflurane, methoxyflurane, propofol -linezolid -local anesthetics like lidocaine, pramoxine, tetracaine -medicines that relax muscles for surgery -other narcotic medicines for pain or cough -phenothiazines like chlorpromazine, mesoridazine, prochlorperazine, thioridazine -procarbazine This list may not describe all possible interactions. Give your health care provider a list of all the medicines, herbs, non-prescription drugs, or dietary supplements you use. Also tell them if you smoke, drink alcohol, or use illegal drugs. Some items may interact with your medicine. What should I watch for while using this medicine? Tell your doctor or health care professional if your pain does not go away, if it gets worse, or if you have new or a different type of pain. You may develop tolerance to the medicine. Tolerance means that you will need a higher dose of the medicine for pain relief. Tolerance is normal and is expected if you take the medicine for a long time. Do not suddenly stop taking your medicine because you may develop a severe reaction. Your body becomes used to the medicine. This does NOT mean you are addicted. Addiction is a behavior related to getting and using a drug for a non-medical reason. If you  have pain, you have a medical reason to  take pain medicine. Your doctor will tell you how much medicine to take. If your doctor wants you to stop the medicine, the dose will be slowly lowered over time to avoid any side effects. There are different types of narcotic medicines (opiates). If you take more than one type at the same time or if you are taking another medicine that also causes drowsiness, you may have more side effects. Give your health care provider a list of all medicines you use. Your doctor will tell you how much medicine to take. Do not take more medicine than directed. Call emergency for help if you have problems breathing or unusual sleepiness. Do not take other medicines that contain acetaminophen with this medicine. Always read labels carefully. If you have questions, ask your doctor or pharmacist. If you take too much acetaminophen get medical help right away. Too much acetaminophen can be very dangerous and cause liver damage. Even if you do not have symptoms, it is important to get help right away. You may get drowsy or dizzy. Do not drive, use machinery, or do anything that needs mental alertness until you know how this medicine affects you. Do not stand or sit up quickly, especially if you are an older patient. This reduces the risk of dizzy or fainting spells. Alcohol may interfere with the effect of this medicine. Avoid alcoholic drinks. This medicine will cause constipation. Try to have a bowel movement at least every 2 to 3 days. If you do not have a bowel movement for 3 days, call your doctor or health care professional. Your mouth may get dry. Chewing sugarless gum or sucking hard candy, and drinking plenty of water may help. Contact your doctor if the problem does not go away or is severe. What side effects may I notice from receiving this medicine? Side effects that you should report to your doctor or health care professional as soon as possible: -allergic reactions like skin rash, itching or hives, swelling of  the face, lips, or tongue -breathing problems -confusion -redness, blistering, peeling or loosening of the skin, including inside the mouth -seizures -signs and symptoms of low blood pressure like dizziness; feeling faint or lightheaded, falls; unusually weak or tired -trouble passing urine or change in the amount of urine -yellowing of the eyes or skin Side effects that usually do not require medical attention (report to your doctor or health care professional if they continue or are bothersome): -constipation -dry mouth -nausea, vomiting -tiredness This list may not describe all possible side effects. Call your doctor for medical advice about side effects. You may report side effects to FDA at 1-800-FDA-1088. Where should I keep my medicine? Keep out of the reach of children. Tramadol is a morphine-like drug that can be abused. Keep your medicine in a safe place to protect it from theft. Do not share this medicine with anyone. Selling or giving away this medicine is dangerous and is against the law. This medicine may cause accidental overdose and death if it taken by other adults, children, or pets. Mix any unused medicine with a substance like cat litter or coffee grounds. Then throw the medicine away in a sealed container like a sealed bag or a coffee can with a lid. Do not use the medicine after the expiration date. Store at room temperature between 15 and 30 degrees C (59 and 86 degrees F). NOTE: This sheet is a summary. It may not cover all possible  information. If you have questions about this medicine, talk to your doctor, pharmacist, or health care provider.  2018 Elsevier/Gold Standard (2014-12-18 10:58:16)     IF you received an x-ray today, you will receive an invoice from [facility]. Please contact [facility] at [phone] with questions or concerns regarding your invoice.   IF you received labwork today, you will receive an invoice from [facility]. Please  contact [facility] at [phone] with questions or concerns regarding your invoice.   Our billing staff will not be able to assist you with questions regarding bills from these companies.  You will be contacted with the lab results as soon as they are available. The fastest way to get your results is to activate your My Chart account. Instructions are located on the last page of this paperwork. If you have not heard from [place] regarding the results in 2 weeks, please contact this office.

## 2016-11-27 ENCOUNTER — Telehealth: Payer: Self-pay

## 2016-11-27 NOTE — Telephone Encounter (Signed)
Started request for Cyclobenzaprine [facility].  Key code is [id number] and should have a faxed outcome within 5 business days.

## 2016-11-29 NOTE — Telephone Encounter (Signed)
Decided to just fax in requested information instead of calling again.  Information submitted and successful fax receipt received.

## 2016-11-29 NOTE — Telephone Encounter (Signed)
Received notice from [facility] regarding request for PA.  More information is needed to reach a decision.  [phone].  I tried to call to finish request, however, due to high call volume they could not take my call at this time.  I will try again later today.

## 2016-11-30 NOTE — Telephone Encounter (Signed)
Received fax approving medication.  Patient notified via VM.  Approval is good 11/27/2016-04/08/2017.

## 2016-12-01 ENCOUNTER — Ambulatory Visit (INDEPENDENT_AMBULATORY_CARE_PROVIDER_SITE_OTHER): Payer: PRIVATE HEALTH INSURANCE | Admitting: Physician Assistant

## 2016-12-01 ENCOUNTER — Encounter: Payer: Self-pay | Admitting: Physician Assistant

## 2016-12-01 ENCOUNTER — Ambulatory Visit: Payer: PRIVATE HEALTH INSURANCE | Admitting: Physician Assistant

## 2016-12-01 VITALS — BP 92/54 | HR 84 | Temp 98.2°F | Resp 16 | Ht 63.0 in | Wt 211.2 lb

## 2016-12-01 DIAGNOSIS — M25512 Pain in left shoulder: Secondary | ICD-10-CM | POA: Diagnosis not present

## 2016-12-01 DIAGNOSIS — M542 Cervicalgia: Secondary | ICD-10-CM

## 2016-12-01 DIAGNOSIS — Z96652 Presence of left artificial knee joint: Secondary | ICD-10-CM | POA: Diagnosis not present

## 2016-12-01 DIAGNOSIS — M25562 Pain in left knee: Secondary | ICD-10-CM

## 2016-12-01 NOTE — Patient Instructions (Addendum)
Ice the area of the shoulder and knee three times per day for 15 minutes.  You can continue to use the tramadol and tylenol, but be careful with the sedative effects of the muscle relaxant.   Await the contact for the referral.      IF you received an x-ray today, you will receive an invoice from [facility]. Please contact [facility] at [phone] with questions or concerns regarding your invoice.   IF you received labwork today, you will receive an invoice from [facility]. Please contact [facility] at [phone] with questions or concerns regarding your invoice.   Our billing staff will not be able to assist you with questions regarding bills from these companies.  You will be contacted with the lab results as soon as they are available. The fastest way to get your results is to activate your My Chart account. Instructions are located on the last page of this paperwork. If you have not heard from [place] regarding the results in 2 weeks, please contact this office.

## 2016-12-01 NOTE — Progress Notes (Signed)
PRIMARY CARE AT [facility] [address], [place] [address] [phone]  Date:  12/01/2016   Name:  [name]   DOB:  Oct 07, 1940   MRN:  [id number]  PCP:  Patient, No Pcp Per    History of Present Illness:  [name] is a 76 y.o. female patient who presents to PCP with  Chief Complaint  Patient presents with  . Follow-up    neck pain, shoulder arms and back      Pain on right side shoulder arms, back, and neck pain.   She states that the tramadol is helping.  Muscle relaxer which did not "do a thing".  She has not used any thermotherapy.  It feels better with showering.  She is not doing any stretches.  She attempted to tdo the stretches that he discussed, but this did not improve her symptoms.  Patient Active Problem List   Diagnosis Date Noted  . Primary osteoarthritis of left knee 04/04/2016  . 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 Anxiety 11/15/2015  . Gastroesophageal reflux disease without esophagitis 11/15/2015  . Solitary right kidney 07/07/2015  . Urge incontinence of urine 07/07/2015  . Asthma 12/07/2011  . Arthritis 12/07/2011  . 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 Obesity  12/07/2011  . Incisional hernia 03/29/2011  . DIVERTICULOSIS-COLON 12/01/2008  . ABDOMINAL PAIN-RLQ 12/01/2008    Past Medical History:  Diagnosis Date  . Anemia 1990  . Anxiety   . Arthritis 2004  . Asthma 1995  . Colon polyp   . Complication of anesthesia 1966   first surgery she had- had a hard time waking up from surgery  . Diverticulosis 2003  . GERD (gastroesophageal reflux disease)     Past Surgical History:  Procedure Laterality Date  . APPENDECTOMY  1966  . CATARACT EXTRACTION     Bilateral  . CHOLECYSTECTOMY  2004  . COLON SURGERY  2008  . COLOSTOMY    . COLOSTOMY TAKEDOWN    . DIAGNOSTIC LAPAROSCOPY     exploratory for endometriosis  . HEEL SPUR SURGERY     Right, Metal Plate in Heel  . [place]  2010  . NEPHRECTOMY  Left 2637   As complication of partial colectomy for diverticulitis  . TONSILLECTOMY    . 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   Procedure: LEFT TOTAL KNEE ARTHROPLASTY;  Surgeon: Susa Day, MD;  Location: [facility];  Service: Orthopedics;  Laterality: Left;  Adductor Block    Social History  Substance Use Topics  . Smoking status: Never Smoker  . Smokeless tobacco: Never Used  . Alcohol use Yes     Comment: occassionally    Family History  Problem Relation Age of Onset  . Colon polyps Other        niece  . Diabetes Paternal Grandmother   . Diabetes Paternal Aunt   . Heart failure Mother   . Heart attack Father     Allergies  Allergen Reactions  . Haloperidol Anaphylaxis  . Aripiprazole Other (See Comments)    Bad thoughts     Medication list has been reviewed and updated.  Current Outpatient Prescriptions on File Prior to Visit  Medication Sig Dispense Refill  . acetaminophen (TYLENOL) 650 MG CR tablet Take 1,300 mg by mouth at bedtime as needed for pain.    Marland Kitchen albuterol (PROVENTIL HFA;VENTOLIN HFA) 108 (90 Base) MCG/ACT inhaler Inhale into the lungs every 6 (six) hours as needed for wheezing or  shortness of breath.    . cetirizine (ZYRTEC) 10 MG tablet Take 1 tablet (10 mg total) by mouth daily. 30 tablet 2  . cyclobenzaprine (FLEXERIL) 5 MG tablet Take 1 tablet (5 mg total) by mouth at bedtime. 90 tablet 1  . Homeopathic Products (SIMILASAN DRY EYE RELIEF OP) Place 2 drops into both eyes 2 (two) times daily as needed (dryness).    . polyethylene glycol powder (GLYCOLAX/MIRALAX) powder Take 17 g by mouth 2 (two) times daily as needed. 578 g 1  . ranitidine (ZANTAC) 150 MG tablet Take 1 tablet (150 mg total) by mouth 2 (two) times daily. 60 tablet 0  . traMADol (ULTRAM) 50 MG tablet Take 50 mg by mouth daily as needed for moderate pain.     . traMADol-acetaminophen (ULTRACET) 37.5-325 MG tablet Take 1 tablet by mouth every 6 (six) hours as needed. (Patient not taking:  Reported on 12/01/2016) 30 tablet 0   No current facility-administered medications on file prior to visit.     ROS ROS otherwise unremarkable unless listed above.  Physical Examination: BP (!) 92/54   Pulse 84   Temp 98.2 F (36.8 C) (Oral)   Resp 16   Ht 5\' 3"  (1.6 m)   Wt 211 lb 3.2 oz (95.8 kg)   SpO2 95%   BMI 37.41 kg/m  Ideal Body Weight: Weight in (lb) to have BMI = 25: 140.8  Physical Exam  Constitutional: She is oriented to person, place, and time. She appears well-developed and well-nourished. No distress.  HENT:  Head: Normocephalic and atraumatic.  Right Ear: External ear normal.  Left Ear: External ear normal.  Eyes: Pupils are equal, round, and reactive to light. Conjunctivae and EOM are normal.  Cardiovascular: Normal rate.   Pulmonary/Chest: Effort normal. No respiratory distress.  Musculoskeletal:  patient has good ROM at the neck and left shoulder.  No pan incited with ROM, or resisted movement.  Left knee pain with ROM pain incited.  Neurological: She is alert and oriented to person, place, and time.  Skin: She is not diaphoretic.  Psychiatric: She has a normal mood and affect. Her behavior is normal.   Assessment and Plan: [name] is a 76 y.o. female who is here today for cc of neck knee, and shoulder pain.  Referred to previous orthopedist.   She is not performing anything to help her symptoms.  May need physical therapy.  Following knee replacement, this will need to be re-evaluated  Cervical pain - Plan: AMB referral to orthopedics  Pain in joint of left shoulder - Plan: AMB referral to orthopedics  Acute pain of left knee - Plan: AMB referral to orthopedics  History of total left knee replacement - Plan: AMB referral to orthopedics  Ivar Drape, PA-C [facility] 9/3/20184:20 PM

## 2016-12-06 ENCOUNTER — Encounter: Payer: Medicare (Managed Care) | Admitting: Internal Medicine

## 2017-01-12 ENCOUNTER — Ambulatory Visit (INDEPENDENT_AMBULATORY_CARE_PROVIDER_SITE_OTHER): Payer: PRIVATE HEALTH INSURANCE | Admitting: Physician Assistant

## 2017-01-12 DIAGNOSIS — Z23 Encounter for immunization: Secondary | ICD-10-CM

## 2017-04-14 ENCOUNTER — Ambulatory Visit (HOSPITAL_COMMUNITY)
Admission: RE | Admit: 2017-04-14 | Discharge: 2017-04-14 | Disposition: A | Discharge status: Home / Self Care | Payer: Medicare Other | Attending: Psychiatry | Admitting: Psychiatry
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
## 2017-04-14 NOTE — H&P (Signed)
Behavioral Health Medical Screening Exam  [name] is an 77 y.o. female.  Total Time spent with patient: PT left before being seen  Psychiatric Specialty Exam: Physical Exam  ROS  There were no vitals taken for this visit.There is no height or weight on file to calculate BMI.  General Appearance: Pt left without being seen  Eye Contact:  Pt left without being seen  Speech:  Pt left without being seen  Volume:  Pt left without being seen  Mood:  Pt left without being seen  Affect:  Pt left without being seen  Thought Process:  Pt left without being seen  Orientation:  Pt left without being seen  Thought Content:  Pt left without being seen  Suicidal Thoughts:  Pt left without being seen  Homicidal Thoughts:  Pt left without being seen  Memory:  Pt left without being seen  Judgement:  Pt left without being seen  Insight:  Pt left without being seen  Psychomotor Activity:  Pt left without being seen  Concentration: Pt left without being seen  Recall:  Pt left without being seen  Fund of Knowledge:Pt left without being seen  Language: Pt left without being seen  Akathisia:  Pt left without being seen  Handed:  Pt left without being seen  AIMS (if indicated):     Assets:  Pt left without being seen  Sleep:       Musculoskeletal: Strength & Muscle Tone: Pt left without being seen Gait & Station: Pt left without being seen Patient leans: Pt left without being seen  VS: T 97.8, P 89, BP 108/56, O2 98% RA  Recommendations:  Pt left without being seen but other staff members state that pt was not in distress.   Pt was informed specifically that this is a 2 step process where the TTS Counselor sees the patient, then the NP/MD sees the patient to do a brief physical exam. The pt saw the counselor and the decision was made that this patient does not meet inpatient criteria but would benefit from counseling.  Then, another unknown employee allowed the patient to leave the  facility a few minutes later. The front desk employee did confirm she saw the patient walk out the front door less than 5 minutes before I inquired about her location. Given that the patient was notified she would have a physical exam, and chose to leave the facility before being seen without communicating thoroughly with staff, this is a refusal of care.   Elizabeth Mola, FNP 04/14/2017, 4:17 PM   Agree with NP Assessment

## 2017-04-15 ENCOUNTER — Encounter (HOSPITAL_COMMUNITY): Payer: Self-pay

## 2017-04-15 ENCOUNTER — Emergency Department (HOSPITAL_COMMUNITY)
Admission: EM | Admit: 2017-04-15 | Discharge: 2017-04-21 | Disposition: A | Discharge status: Other Acute Inpatient Hospital | Payer: Medicare Other | Attending: Emergency Medicine | Admitting: Emergency Medicine

## 2017-04-15 ENCOUNTER — Other Ambulatory Visit: Payer: Self-pay

## 2017-04-15 DIAGNOSIS — Z79899 Other long term (current) drug therapy: Secondary | ICD-10-CM | POA: Insufficient documentation

## 2017-04-15 DIAGNOSIS — J45909 Unspecified asthma, uncomplicated: Secondary | ICD-10-CM | POA: Diagnosis not present

## 2017-04-15 DIAGNOSIS — F22 Delusional disorders: Secondary | ICD-10-CM | POA: Insufficient documentation

## 2017-04-15 DIAGNOSIS — R4182 Altered mental status, unspecified: Secondary | ICD-10-CM | POA: Diagnosis present

## 2017-04-15 DIAGNOSIS — Z96652 Presence of left artificial knee joint: Secondary | ICD-10-CM | POA: Diagnosis not present

## 2017-04-15 DIAGNOSIS — F29 Unspecified psychosis not due to a substance or known physiological condition: Secondary | ICD-10-CM

## 2017-04-15 LAB — RAPID URINE DRUG SCREEN, HOSP PERFORMED
Amphetamines: NOT DETECTED
BARBITURATES: NOT DETECTED
BENZODIAZEPINES: NOT DETECTED
Cocaine: NOT DETECTED
OPIATES: NOT DETECTED
Tetrahydrocannabinol: NOT DETECTED

## 2017-04-15 LAB — COMPREHENSIVE METABOLIC PANEL
ALBUMIN: 3.7 g/dL (ref 3.5–5.0)
ALT: 25 U/L (ref 14–54)
AST: 20 U/L (ref 15–41)
Alkaline Phosphatase: 76 U/L (ref 38–126)
Anion gap: 6 (ref 5–15)
BUN: 14 mg/dL (ref 6–20)
CALCIUM: 8.9 mg/dL (ref 8.9–10.3)
CO2: 20 mmol/L — AB (ref 22–32)
Chloride: 112 mmol/L — ABNORMAL HIGH (ref 101–111)
Creatinine, Ser: 1.07 mg/dL — ABNORMAL HIGH (ref 0.44–1.00)
GFR calc non Af Amer: 49 mL/min — ABNORMAL LOW (ref >60)
GFR, EST AFRICAN AMERICAN: 57 mL/min — AB (ref >60)
GLUCOSE: 103 mg/dL — AB (ref 65–99)
POTASSIUM: 4.3 mmol/L (ref 3.5–5.1)
SODIUM: 138 mmol/L (ref 135–145)
TOTAL PROTEIN: 6.4 g/dL — AB (ref 6.5–8.1)
Total Bilirubin: 0.8 mg/dL (ref 0.3–1.2)

## 2017-04-15 LAB — URINALYSIS, ROUTINE W REFLEX MICROSCOPIC
BILIRUBIN URINE: NEGATIVE
GLUCOSE, UA: NEGATIVE mg/dL
HGB URINE DIPSTICK: NEGATIVE
KETONES UR: NEGATIVE mg/dL
Nitrite: NEGATIVE
PH: 5 (ref 5.0–8.0)
PROTEIN: NEGATIVE mg/dL
Specific Gravity, Urine: 1.013 (ref 1.005–1.030)

## 2017-04-15 LAB — CBC
HEMATOCRIT: 44.5 % (ref 36.0–46.0)
HEMOGLOBIN: 14.4 g/dL (ref 12.0–15.0)
MCH: 29.6 pg (ref 26.0–34.0)
MCHC: 32.4 g/dL (ref 30.0–36.0)
MCV: 91.6 fL (ref 78.0–100.0)
Platelets: 244 10*3/uL (ref 150–400)
RBC: 4.86 MIL/uL (ref 3.87–5.11)
RDW: 12.9 % (ref 11.5–15.5)
WBC: 8.6 10*3/uL (ref 4.0–10.5)

## 2017-04-15 LAB — SALICYLATE LEVEL

## 2017-04-15 LAB — CBG MONITORING, ED: Glucose-Capillary: 81 mg/dL (ref 65–99)

## 2017-04-15 LAB — ETHANOL: Alcohol, Ethyl (B): <10 mg/dL (ref <10)

## 2017-04-15 LAB — ACETAMINOPHEN LEVEL

## 2017-04-15 MED ORDER — ACETAMINOPHEN 500 MG PO TABS
500.0000 mg | ORAL_TABLET | ORAL | Status: DC | PRN
  Administered 2017-04-16 – 2017-04-19 (×5): 500 mg via ORAL
  Filled 2017-04-15 (×5): qty 1

## 2017-04-15 MED ORDER — CYCLOBENZAPRINE HCL 10 MG PO TABS
5.0000 mg | ORAL_TABLET | Freq: Every day | ORAL | Status: DC
  Administered 2017-04-16 – 2017-04-20 (×5): 5 mg via ORAL
  Filled 2017-04-15 (×5): qty 1

## 2017-04-15 MED ORDER — LORATADINE 10 MG PO TABS
10.0000 mg | ORAL_TABLET | Freq: Every day | ORAL | Status: DC
  Administered 2017-04-16 – 2017-04-21 (×6): 10 mg via ORAL
  Filled 2017-04-15 (×6): qty 1
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
## 2017-04-15 NOTE — ED Notes (Signed)
PT placed in paper scrubs and wanded. 2 pt belonging bags placed in locker #4. Staffing called for sitter

## 2017-04-16 MED ORDER — AEROCHAMBER PLUS W/MASK MISC
1.0000 | Freq: Once | Status: DC
  Filled 2017-04-16: qty 1

## 2017-04-16 MED ORDER — ALBUTEROL SULFATE HFA 108 (90 BASE) MCG/ACT IN AERS: 2.0000 | INHALATION_SPRAY | RESPIRATORY_TRACT | Status: DC | PRN

## 2017-04-16 MED ORDER — ARTIFICIAL TEARS OPHTHALMIC OINT
1.0000 "application " | TOPICAL_OINTMENT | OPHTHALMIC | Status: DC | PRN
  Administered 2017-04-16 (×2): 1 via OPHTHALMIC
  Filled 2017-04-16: qty 3.5
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
## 2017-04-16 NOTE — ED Notes (Signed)
Pt became upset when advised [facility] seeking inpt placement. States she wants to "stay where I'm at". Allowed pt to vent concerns/feelings. Pt noted to remain w/confusion. Pt noted to be calm, cooperative after conversation.

## 2017-04-16 NOTE — ED Notes (Signed)
Pt given artificial tears as requested for c/o dry eyes.

## 2017-04-16 NOTE — ED Notes (Signed)
TTS at bedside. 

## 2017-04-16 NOTE — ED Provider Notes (Signed)
Alert.  Ambulatory.  Calm.  Denies complaint.   Orlie Dakin, MD 04/16/17 [phone]

## 2017-04-16 NOTE — [facility] Counselor (Signed)
Reassessment: Pt was delusional during assessment and states that she got good news that she is "going home to her husband and kids". Pt still believes that she has newborns in the NICU. Pt continues to meet inpatient treatment.  [address] Fernando Salinas, LCAS

## 2017-04-16 NOTE — ED Notes (Signed)
Talking w/Sitter while eating lunch.

## 2017-04-16 NOTE — ED Notes (Signed)
Re-TTS being performed.  

## 2017-04-16 NOTE — Progress Notes (Addendum)
Patient meets criteria for inpatient treatment. CSW faxed referrals to the following inpatient facilities for review:  [facility], [facility], [facility], [facility],     TTS will continue to seek bed placement.   Radonna Ricker, MSW, LCSWA Clinical Social Worker (Disposition) [facility]  [phone]  @ 16:16 Sheran Luz

## 2017-04-16 NOTE — [facility] Counselor (Signed)
Patriciaann Clan, NP recommends geriatric inpatient treatment. Tori, RN was made aware of the recommendations.

## 2017-04-16 NOTE — ED Notes (Signed)
Ambulatory to shower w/Sitter.  

## 2017-04-16 NOTE — ED Notes (Signed)
Yellow Fall Risk bracelet applied to pt's wrist and Fall Risk magnetic sign placed on door d/t pt w/confusion/delusions.

## 2017-04-17 NOTE — ED Notes (Signed)
Regular Diet ordered for Dinner. 

## 2017-04-17 NOTE — ED Triage Notes (Signed)
Pt came to staff desk to report she thought she had a UTI because of burning when voiding.

## 2017-04-17 NOTE — [facility] Counselor (Signed)
Client presented to [facility] under IVC for delusion.  Pt was reassessed.  She was pleasant in demeanor and recognized author from her in-person assessment (Pt presented as a walk-in on 04/14/17).  Pt stated that she was not sure why she was at the hospital other than her niece's request.  When prompted, Pt endorsed belief that she has several babies at the NICU, including a daughter named Josph Macho.  "They are mine and my husband's."  Per report, Pt is not married.    As Pt continues to endorse delusion, recommend continued inpatient care.  From initial assessment: [name] is an 77 y.o. female.  The pt came in after being IVC'd by her niece.  The pt believes a residence is her husband's residence.  According to the niece, the home belongs to a stranger.  According to a message from the pt's niece, the pt is not married.  It is unclear if the pt was married in the past.  The pt also reports she has between 30-50 children and 4 are newborn in the NICU.  She reports her eggs were harvested and given to other family members.  The pt stated she wanted to go to the NICU to go see her children.  The pt also stated the [employer] is following her since [name] and the [employer] is keeping track of her electronically.  The pt is currently not seeing a counselor or a psychiatrist.

## 2017-04-17 NOTE — ED Notes (Signed)
Pt requested a [place] sandwich with a extra pack for mayo, salt and pepper and apple sauce for snack. Pt given the same

## 2017-04-17 NOTE — ED Notes (Signed)
TTS done 

## 2017-04-18 MED ORDER — LORAZEPAM 0.5 MG PO TABS
0.5000 mg | ORAL_TABLET | Freq: Four times a day (QID) | ORAL | Status: DC | PRN
  Administered 2017-04-18 – 2017-04-20 (×3): 0.5 mg via ORAL
  Filled 2017-04-18 (×4): qty 1

## 2017-04-18 NOTE — ED Notes (Signed)
Pt refused to have her vital signs assessed. Pt was agitated and confrontational.

## 2017-04-18 NOTE — ED Notes (Signed)
1100 up to shower linens changed

## 2017-04-18 NOTE — ED Notes (Signed)
Pt in room sobbing. When RN asked pt what was wrong. Pt states. "I don't want to be here anymore. Why am I still here?" RN explained that counselors at [facility] were seeking inpatient placement. Pt became angry, states, "I have a home, why can't I go there?" RN explained to pt that she is IVC'd. Pt asked for proof of paperwork. RN explained that she can not show her the paperwork. Pt became angry and asked to speak to someone in charge. Charge RN Raquel Sarna made aware of situation and advised RN to consult with MD. Attempted to contact MD [place] without success.
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
## 2017-04-18 NOTE — ED Notes (Signed)
MD Venora Maples made aware of situation. See new orders.

## 2017-04-19 NOTE — ED Notes (Signed)
Regular Diet was ordered for Dinner. 

## 2017-04-19 NOTE — ED Provider Notes (Signed)
Pt alert, content, nad.  Vitals:   04/19/17 0554 04/19/17 1156  BP: (!) 146/74 133/75  Pulse: 72 74  Resp: 18 19  Temp: 97.7 F (36.5 C) 97.8 F (36.6 C)  SpO2: 92% 93%   Reviewed nursing notes and prior charts for additional history.   Patient awaiting [place] placement.      Lajean Saver, MD 04/19/17 1236

## 2017-04-19 NOTE — ED Notes (Addendum)
Patient very agitated that she is being held here against her will.  This RN attempted to explain what IVC papers were and why they were taken out against her.  Due to patients delusional mindset unable to explain the meaning to IVC. Patient very upset at this point stating she has rights to know why she's being held.  This RN read the IVC findings section for reasoning why the papers were initially taken out and then affirmed by D. Darl Householder MD. Patient stating that she will be suing this facility and everyone is liable for her being held against her will with no one from the public knowing she is here.  [name] MD aware and to talk with the patient.

## 2017-04-19 NOTE — ED Notes (Signed)
Regular Diet ordered for Lunch. 

## 2017-04-19 NOTE — ED Notes (Signed)
Pt got up to go to the bathroom. Sitter noticed pt seemed unsteady, and asked pt where she was going. Pt states, "I'm going out the door." Sitter tells pt she can not go out the door. Pt becomes agitated and states, "I'm going to the bathroom, would you grow up, be an adult and stop being such a grouch?" Pt goes to bathroom and back to room cooperatively.

## 2017-04-19 NOTE — ED Notes (Signed)
Medicated for sleep per order.  Obtained VS at this time while patient is awake and willing.  No distress noted at this time.

## 2017-04-19 NOTE — ED Notes (Signed)
Patient was given Apple Juice. 

## 2017-04-19 NOTE — BH Assessment (Signed)
[facility] to follow up with referral sent. Shirlee Limerick at [facility] requested the referral to be re faxed. Resent referral for review.

## 2017-04-19 NOTE — Progress Notes (Signed)
Pt referrals refaxed to all the previous geriatric facilities:  [facility] , [facility], [facility], [facility], [facility], [facility], [facility]    Disposition CSW will continue to seek placement. Patient may need to be referred to [facility] if not accepted in the next 12 hours.  Patient has been declined by: [facility]-Unable to verify her insurance. Areatha Keas. Judi Cong, MSW, [place] Disposition Clinical Social Work [phone] (cell) [phone] (office)
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
## 2017-04-20 NOTE — ED Notes (Addendum)
Eating dinner. States "I want to talk to you. I want you to know I'm thinking about leaving this hospital whether you like it or not". RN allowed pt to vent feelings/concerns. Pt then stated "I will wait until tomorrow and then I will call the [employer] and that is not a threat, but I will wait until tomorrow". Pt also stated "I don't think I have had my babies yet. I thought they were on the 6th floor but they're not". Pt noted to remain calm, cooperative.

## 2017-04-20 NOTE — ED Notes (Signed)
Pt states she is in the hospital so that she can learn to get along better w/her family. States her family has not been to visit and she hopes her husband will visit today. Pt noted to be pleasantly confused.

## 2017-04-20 NOTE — BHH Counselor (Signed)
Reassessment Note: Pt denies SI, HI or AVH. Pt is still experiencing fixed delusions and was very tangential this morning. However, she was calm and cooperative. Pt continues to state that she has "babies in the NICU" and wants her husband to get her so they can go home. Pt states that she had her "eggs frozen" and put into surrogate mothers so she "doesn't know how many kids she has". 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   TTS will continue to look for inpatient treatment.   [address] Tensed, LCAS

## 2017-04-20 NOTE — ED Notes (Signed)
Resting well so far this half of the shift.  Ambulates to bathroom with no assistance needed then goes back to sleep.

## 2017-04-20 NOTE — ED Notes (Signed)
Re-TTS completed.  

## 2017-04-20 NOTE — ED Notes (Addendum)
Pt continues to state "my child died" and that "my husband is coming tomorrow"

## 2017-04-20 NOTE — ED Notes (Signed)
[facility], called to inquire about placement for pt. Advised him pt has been calm, cooperative. No behavioral meds needed.
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
## 2017-04-20 NOTE — Progress Notes (Signed)
Faxed IVC paperwork to [facility] intake- per Dellis Filbert pt being considered for admission.  Sharren Bridge, MSW, LCSW Clinical Social Work 04/20/2017 [phone]

## 2017-04-21 NOTE — ED Notes (Signed)
Per Nira Conn, [facility] - pt accepted to [facility] - call report when transport arrives. RN left message for Sheriff's Deputy to notify of transport request.

## 2017-04-21 NOTE — ED Notes (Signed)
Advised pt's niece, [name], of tx plan - being transported to [facility]. Voiced understanding pt will need to call to give her a code prior to her being able speak/visit w/pt.

## 2017-04-21 NOTE — ED Notes (Addendum)
Dr Venora Maples attempted to speak w/pt as pt had requested yesterday to see a dr today - pt sleeping. Will return.

## 2017-04-21 NOTE — ED Notes (Signed)
Ordered Breakfast Tray  

## 2017-04-21 NOTE — ED Notes (Signed)
Breakfast at pt's bedside

## 2017-04-21 NOTE — Progress Notes (Signed)
Pt has been accepted to [facility] per [place] in Admissions. MAR and IVC paperwork faxed to him per his request. CSW notified Becky RN at [facility] regarding disposition. Pt accepted to [facility] unit by Dr. Camie Patience. Number for report: [phone].   Maxie Better, MSW, LCSW Clinical Social Worker 04/21/2017 1:02 PM

## 2017-04-21 NOTE — BH Assessment (Signed)
[facility] Assessment Progress Note  Clinician reassessed pt. She states she is upset because she realized one of her babies died. She states she is very angry with Dr. Macy Mis. She states he has one of her children. She states "on paper they look like good parents but they aren't."  She appeared to continue to be delusional throughout assessment. She denies SI/HI/AVH. Pt continues to meet inpatient criteria.   [place] MSW, LCSW  04/21/2017 1:13 PM

## 2017-06-08 ENCOUNTER — Telehealth: Payer: Self-pay | Admitting: Physician Assistant

## 2017-06-08 NOTE — Telephone Encounter (Signed)
pt called for refill on allergy med cetriivine/zyrtec 4 mg not sure if you need appt. FR

## 2017-06-12 ENCOUNTER — Other Ambulatory Visit: Payer: Self-pay | Admitting: *Deleted

## 2017-06-12 MED ORDER — CETIRIZINE HCL 10 MG PO TABS: 10.0000 mg | ORAL_TABLET | Freq: Every day | ORAL | 2 refills | Status: DC

## 2017-06-12 NOTE — Telephone Encounter (Signed)
Patient is calling to check on the refill request for Zyrtec. Please advise.   [phone]

## 2017-06-28 ENCOUNTER — Telehealth (HOSPITAL_COMMUNITY): Payer: Self-pay | Admitting: Psychiatry

## 2017-07-01 ENCOUNTER — Ambulatory Visit (HOSPITAL_COMMUNITY): Payer: Self-pay | Admitting: Psychiatry

## 2017-07-10 ENCOUNTER — Encounter: Payer: Self-pay | Admitting: Physician Assistant

## 2017-07-25 ENCOUNTER — Ambulatory Visit: Payer: PRIVATE HEALTH INSURANCE | Admitting: Family Medicine

## 2017-07-25 NOTE — Progress Notes (Deleted)
No chief complaint on file.   HPI  4 review of systems  Past Medical History:  Diagnosis Date  . Anemia 1990  . Anxiety   . Arthritis 2004  . Asthma 1995  . Colon polyp   . Complication of anesthesia 1966   first surgery she had- had a hard time waking up from surgery  . Diverticulosis 2003  . GERD (gastroesophageal reflux disease)     Current Outpatient Medications  Medication Sig Dispense Refill  . acetaminophen (TYLENOL) 650 MG CR tablet Take 1,300 mg by mouth at bedtime as needed for pain.    Marland Kitchen albuterol (PROVENTIL HFA;VENTOLIN HFA) 108 (90 Base) MCG/ACT inhaler Inhale into the lungs every 6 (six) hours as needed for wheezing or shortness of breath.    . cetirizine (ZYRTEC) 10 MG tablet Take 1 tablet (10 mg total) by mouth daily. 30 tablet 2  . cyclobenzaprine (FLEXERIL) 5 MG tablet Take 1 tablet (5 mg total) by mouth at bedtime. (Patient taking differently: Take 5 mg by mouth at bedtime as needed for muscle spasms. ) 90 tablet 1  . Homeopathic Products (SIMILASAN DRY EYE RELIEF OP) Place 2 drops into both eyes 2 (two) times daily as needed (dryness).    . polyethylene glycol powder (GLYCOLAX/MIRALAX) powder Take 17 g by mouth 2 (two) times daily as needed. 578 g 1  . ranitidine (ZANTAC) 150 MG tablet Take 1 tablet (150 mg total) by mouth 2 (two) times daily. (Patient taking differently: Take 150 mg by mouth 2 (two) times daily as needed for heartburn. ) 60 tablet 0  . traMADol (ULTRAM) 50 MG tablet Take 50 mg by mouth daily as needed for moderate pain.     . traMADol-acetaminophen (ULTRACET) 37.5-325 MG tablet Take 1 tablet by mouth every 6 (six) hours as needed. (Patient not taking: Reported on 12/01/2016) 30 tablet 0   No current facility-administered medications for this visit.     Allergies:  Allergies  Allergen Reactions  . Haloperidol Anaphylaxis  . Aripiprazole Other (See Comments)    Bad thoughts     Past Surgical History:  Procedure Laterality Date  .  APPENDECTOMY  1966  . CATARACT EXTRACTION     Bilateral  . CHOLECYSTECTOMY  2004  . COLON SURGERY  2008  . COLOSTOMY    . COLOSTOMY TAKEDOWN    . DIAGNOSTIC LAPAROSCOPY     exploratory for endometriosis  . HEEL SPUR SURGERY     Right, Metal Plate in Heel  . [place]  2010  . NEPHRECTOMY Left 3267   As complication of partial colectomy for diverticulitis  . TONSILLECTOMY    . 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 Marital status: Legally Separated    Spouse name: Not on file  . Number of children: 10  . Years of education: Not on file  . Highest education level: Not on file  Occupational History  . Occupation: retired  Scientific laboratory technician  . Financial resource strain: Not on file  . Food insecurity:    Worry: Not on file    Inability: Not on file  . Transportation needs:    Medical: Not on file    Non-medical: Not on file  Tobacco Use  . Smoking status: Never Smoker  . Smokeless tobacco: Never Used  Substance and Sexual Activity  . Alcohol use: Yes    Comment: occassionally  . Drug use: No  . Sexual activity: Not Currently  Lifestyle  . Physical activity:    Days per week: Not on file    Minutes per session: Not on file  . Stress: Not on file  Relationships  . Social connections:    Talks on phone: Not on file    Gets together: Not on file    Attends religious service: Not on file    Active member of club or organization: Not on file    Attends meetings of clubs or organizations: Not on file    Relationship status: Not on file  Other Topics Concern  . Not on file  Social History Narrative  . Not on file    Family History  Problem Relation Age of Onset  . Colon polyps Other        niece  . Diabetes Paternal Grandmother   . Diabetes Paternal Aunt   .  Heart failure Mother   . Heart attack Father      ROS Review of Systems See HPI [place]: No fevers or chills No malaise No diaphoresis Skin: No rash or itching Eyes: no blurry vision, no double vision GU: no dysuria or hematuria Neuro: no dizziness or headaches * all others reviewed and negative   Objective: There were no vitals filed for this visit.  Physical Exam  Assessment and Plan There are no diagnoses linked to this encounter.   Lakeya Mulka P [employer]

## 2017-07-29 ENCOUNTER — Encounter (HOSPITAL_COMMUNITY): Payer: Self-pay | Admitting: *Deleted

## 2017-07-29 ENCOUNTER — Emergency Department (HOSPITAL_COMMUNITY): Payer: Medicare Other

## 2017-07-29 ENCOUNTER — Emergency Department (HOSPITAL_COMMUNITY)
Admission: EM | Admit: 2017-07-29 | Discharge: 2017-07-29 | Disposition: A | Discharge status: Other Acute Inpatient Hospital | Payer: Medicare Other | Attending: Emergency Medicine | Admitting: Emergency Medicine

## 2017-07-29 DIAGNOSIS — Z96652 Presence of left artificial knee joint: Secondary | ICD-10-CM | POA: Insufficient documentation

## 2017-07-29 DIAGNOSIS — Z046 Encounter for general psychiatric examination, requested by authority: Secondary | ICD-10-CM | POA: Diagnosis not present

## 2017-07-29 DIAGNOSIS — J45909 Unspecified asthma, uncomplicated: Secondary | ICD-10-CM | POA: Diagnosis not present

## 2017-07-29 DIAGNOSIS — Z01818 Encounter for other preprocedural examination: Secondary | ICD-10-CM

## 2017-07-29 DIAGNOSIS — F22 Delusional disorders: Secondary | ICD-10-CM

## 2017-07-29 DIAGNOSIS — N3090 Cystitis, unspecified without hematuria: Secondary | ICD-10-CM

## 2017-07-29 DIAGNOSIS — Z79899 Other long term (current) drug therapy: Secondary | ICD-10-CM | POA: Insufficient documentation

## 2017-07-29 LAB — URINALYSIS, ROUTINE W REFLEX MICROSCOPIC
BILIRUBIN URINE: NEGATIVE
Glucose, UA: NEGATIVE mg/dL
HGB URINE DIPSTICK: NEGATIVE
KETONES UR: NEGATIVE mg/dL
Nitrite: NEGATIVE
PROTEIN: 30 mg/dL — AB
Specific Gravity, Urine: 1.008 (ref 1.005–1.030)
pH: 6 (ref 5.0–8.0)

## 2017-07-29 LAB — CBC WITH DIFFERENTIAL/PLATELET
BASOS PCT: 0 %
Basophils Absolute: 0 10*3/uL (ref 0.0–0.1)
Eosinophils Absolute: 0 10*3/uL (ref 0.0–0.7)
Eosinophils Relative: 0 %
HEMATOCRIT: 45.8 % (ref 36.0–46.0)
Hemoglobin: 15 g/dL (ref 12.0–15.0)
LYMPHS ABS: 1.7 10*3/uL (ref 0.7–4.0)
Lymphocytes Relative: 16 %
MCH: 29.7 pg (ref 26.0–34.0)
MCHC: 32.8 g/dL (ref 30.0–36.0)
MCV: 90.7 fL (ref 78.0–100.0)
MONO ABS: 1.1 10*3/uL — AB (ref 0.1–1.0)
MONOS PCT: 10 %
NEUTROS ABS: 8 10*3/uL — AB (ref 1.7–7.7)
Neutrophils Relative %: 74 %
Platelets: 276 10*3/uL (ref 150–400)
RBC: 5.05 MIL/uL (ref 3.87–5.11)
RDW: 13.1 % (ref 11.5–15.5)
WBC: 10.8 10*3/uL — ABNORMAL HIGH (ref 4.0–10.5)

## 2017-07-29 LAB — RAPID URINE DRUG SCREEN, HOSP PERFORMED
Amphetamines: NOT DETECTED
Barbiturates: NOT DETECTED
Benzodiazepines: NOT DETECTED
COCAINE: NOT DETECTED
OPIATES: NOT DETECTED
TETRAHYDROCANNABINOL: NOT DETECTED

## 2017-07-29 LAB — COMPREHENSIVE METABOLIC PANEL
ALK PHOS: 80 U/L (ref 38–126)
ALT: 30 U/L (ref 14–54)
ANION GAP: 11 (ref 5–15)
AST: 27 U/L (ref 15–41)
Albumin: 4.3 g/dL (ref 3.5–5.0)
BUN: 16 mg/dL (ref 6–20)
CALCIUM: 9.5 mg/dL (ref 8.9–10.3)
CHLORIDE: 106 mmol/L (ref 101–111)
CO2: 22 mmol/L (ref 22–32)
Creatinine, Ser: 1.61 mg/dL — ABNORMAL HIGH (ref 0.44–1.00)
GFR calc non Af Amer: 30 mL/min — ABNORMAL LOW (ref >60)
GFR, EST AFRICAN AMERICAN: 35 mL/min — AB (ref >60)
Glucose, Bld: 115 mg/dL — ABNORMAL HIGH (ref 65–99)
Potassium: 4 mmol/L (ref 3.5–5.1)
SODIUM: 139 mmol/L (ref 135–145)
Total Bilirubin: 1 mg/dL (ref 0.3–1.2)
Total Protein: 7.4 g/dL (ref 6.5–8.1)

## 2017-07-29 LAB — SALICYLATE LEVEL: Salicylate Lvl: <7 mg/dL (ref 2.8–30.0)

## 2017-07-29 LAB — ACETAMINOPHEN LEVEL

## 2017-07-29 LAB — ETHANOL

## 2017-07-29 MED ORDER — CEPHALEXIN 500 MG PO CAPS: 500.0000 mg | ORAL_CAPSULE | Freq: Once | ORAL | Status: DC

## 2017-07-29 MED ORDER — ACETAMINOPHEN ER 650 MG PO TBCR: 1300.0000 mg | EXTENDED_RELEASE_TABLET | Freq: Every evening | ORAL | Status: DC | PRN

## 2017-07-29 MED ORDER — IOPAMIDOL (ISOVUE-300) INJECTION 61%
INTRAVENOUS | Status: AC
  Filled 2017-07-29: qty 30

## 2017-07-29 MED ORDER — ALBUTEROL SULFATE HFA 108 (90 BASE) MCG/ACT IN AERS
1.0000 | INHALATION_SPRAY | Freq: Four times a day (QID) | RESPIRATORY_TRACT | Status: DC | PRN
Start: 2017-07-29 — End: 2017-07-29

## 2017-07-29 MED ORDER — ACETAMINOPHEN 500 MG PO TABS: 1000.0000 mg | ORAL_TABLET | Freq: Every evening | ORAL | Status: DC | PRN

## 2017-07-29 MED ORDER — POLYETHYLENE GLYCOL 3350 17 GM/SCOOP PO POWD: 17.0000 g | Freq: Two times a day (BID) | ORAL | Status: DC | PRN

## 2017-07-29 MED ORDER — CEPHALEXIN 500 MG PO CAPS
500.0000 mg | ORAL_CAPSULE | Freq: Two times a day (BID) | ORAL | Status: DC
  Administered 2017-07-29: 500 mg via ORAL
  Filled 2017-07-29: qty 1

## 2017-07-29 MED ORDER — SODIUM CHLORIDE 0.9 % IJ SOLN
INTRAMUSCULAR | Status: AC
  Filled 2017-07-29: qty 50

## 2017-07-29 MED ORDER — IOPAMIDOL (ISOVUE-300) INJECTION 61%
INTRAVENOUS | Status: AC
  Filled 2017-07-29: qty 100

## 2017-07-29 MED ORDER — SODIUM CHLORIDE 0.9 % IV BOLUS
1000.0000 mL | Freq: Once | INTRAVENOUS | Status: AC
  Administered 2017-07-29: 1000 mL via INTRAVENOUS

## 2017-07-29 MED ORDER — IOPAMIDOL (ISOVUE-300) INJECTION 61%: 30.0000 mL | Freq: Once | INTRAVENOUS | Status: DC | PRN

## 2017-07-29 MED ORDER — POLYETHYLENE GLYCOL 3350 17 G PO PACK
17.0000 g | PACK | Freq: Two times a day (BID) | ORAL | Status: DC | PRN
Start: 2017-07-29 — End: 2017-07-29
  Filled 2017-07-29: qty 1

## 2017-07-29 MED ORDER — IOPAMIDOL (ISOVUE-300) INJECTION 61%: 100.0000 mL | Freq: Once | INTRAVENOUS | Status: DC | PRN

## 2017-07-29 MED ORDER — LORATADINE 10 MG PO TABS
10.0000 mg | ORAL_TABLET | Freq: Every day | ORAL | Status: DC
  Administered 2017-07-29: 10 mg via ORAL
  Filled 2017-07-29: qty 1

## 2017-07-29 NOTE — ED Notes (Signed)
Two personal belonging bags in locker 28
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
## 2017-07-29 NOTE — ED Notes (Signed)
Oxygen sats entered in error.
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
## 2017-07-29 NOTE — ED Notes (Signed)
Pt transported to [facility] by [place]. All belongings returned to pt. Pt was calm and cooperative.

## 2017-07-29 NOTE — ED Triage Notes (Signed)
Pt stated "I'm electronically monitored by my husband and my daughter.  I haven't eaten today.  I don't have very much food in the house.  I have food stamps.  I'm here to be in a safe place."

## 2017-07-29 NOTE — ED Notes (Signed)
Attempted to call report to [facility]. They asked that I call back in 15 minutes.

## 2017-07-29 NOTE — BH Assessment (Signed)
[facility] Assessment Progress Note  Case was staffed with Romilda Garret FNP who recommended a inpatient admission (Geropsychiatry) as appropriate bed placement is investigated.
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
## 2017-07-29 NOTE — ED Notes (Signed)
Pt has two white pt belongings' bags placed in the cabinet labeled "patient belongings 16-18 Res A."

## 2017-07-29 NOTE — ED Notes (Signed)
Bed: DP82 Expected date:  Expected time:  Means of arrival:  Comments: [name]
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
## 2017-07-29 NOTE — ED Notes (Signed)
Bed: [facility] Expected date:  Expected time:  Means of arrival:  Comments: 
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
## 2017-07-30 LAB — URINE CULTURE

## 2017-08-01 DIAGNOSIS — F312 Bipolar disorder, current episode manic severe with psychotic features: Secondary | ICD-10-CM | POA: Insufficient documentation

## 2017-08-13 ENCOUNTER — Other Ambulatory Visit: Payer: Self-pay | Admitting: Physician Assistant

## 2017-08-13 NOTE — Telephone Encounter (Signed)
Copied from [place] [id number]. Topic: Quick Communication - See Telephone Encounter >> Aug 13, 2017 12:06 PM Vernona Rieger wrote: CRM for notification. See Telephone encounter for: 08/13/17.  albuterol (PROVENTIL HFA;VENTOLIN HFA) 108 (90 Base) MCG/ACT inhaler  [facility] [phone] - [place], [place] - [address] AT [address] [place]

## 2017-08-14 NOTE — Telephone Encounter (Signed)
Patient is requesting Rx refill of historical medication: albuterol   LOV: 12/01/16   PCP: [place]: verified

## 2017-08-15 ENCOUNTER — Ambulatory Visit (INDEPENDENT_AMBULATORY_CARE_PROVIDER_SITE_OTHER): Payer: PRIVATE HEALTH INSURANCE

## 2017-08-15 ENCOUNTER — Ambulatory Visit (INDEPENDENT_AMBULATORY_CARE_PROVIDER_SITE_OTHER): Payer: PRIVATE HEALTH INSURANCE | Admitting: Urgent Care

## 2017-08-15 ENCOUNTER — Other Ambulatory Visit: Payer: Self-pay | Admitting: Urgent Care

## 2017-08-15 ENCOUNTER — Encounter: Payer: Self-pay | Admitting: Urgent Care

## 2017-08-15 VITALS — BP 174/108 | HR 78 | Temp 97.5°F | Resp 18 | Ht 63.0 in | Wt 212.0 lb

## 2017-08-15 DIAGNOSIS — M546 Pain in thoracic spine: Secondary | ICD-10-CM | POA: Diagnosis not present

## 2017-08-15 DIAGNOSIS — M5134 Other intervertebral disc degeneration, thoracic region: Secondary | ICD-10-CM

## 2017-08-15 DIAGNOSIS — E6609 Other obesity due to excess calories: Secondary | ICD-10-CM

## 2017-08-15 DIAGNOSIS — M545 Low back pain: Secondary | ICD-10-CM

## 2017-08-15 DIAGNOSIS — R4689 Other symptoms and signs involving appearance and behavior: Secondary | ICD-10-CM

## 2017-08-15 DIAGNOSIS — R030 Elevated blood-pressure reading, without diagnosis of hypertension: Secondary | ICD-10-CM | POA: Diagnosis not present

## 2017-08-15 DIAGNOSIS — M5136 Other intervertebral disc degeneration, lumbar region: Secondary | ICD-10-CM

## 2017-08-15 DIAGNOSIS — M5030 Other cervical disc degeneration, unspecified cervical region: Secondary | ICD-10-CM | POA: Diagnosis not present

## 2017-08-15 DIAGNOSIS — G8929 Other chronic pain: Secondary | ICD-10-CM

## 2017-08-15 DIAGNOSIS — Z6837 Body mass index (BMI) 37.0-37.9, adult: Secondary | ICD-10-CM | POA: Diagnosis not present

## 2017-08-15 DIAGNOSIS — M25551 Pain in right hip: Secondary | ICD-10-CM

## 2017-08-15 DIAGNOSIS — K219 Gastro-esophageal reflux disease without esophagitis: Secondary | ICD-10-CM

## 2017-08-15 MED ORDER — RANITIDINE HCL 150 MG PO TABS: 150.0000 mg | ORAL_TABLET | Freq: Two times a day (BID) | ORAL | 5 refills | Status: DC | PRN

## 2017-08-15 MED ORDER — ALBUTEROL SULFATE HFA 108 (90 BASE) MCG/ACT IN AERS: 2.0000 | INHALATION_SPRAY | Freq: Four times a day (QID) | RESPIRATORY_TRACT | 1 refills | Status: DC | PRN

## 2017-08-15 MED ORDER — AMLODIPINE BESYLATE 5 MG PO TABS: 5.0000 mg | ORAL_TABLET | Freq: Every day | ORAL | 0 refills | Status: DC

## 2017-08-15 MED ORDER — PREDNISONE 20 MG PO TABS: 20.0000 mg | ORAL_TABLET | Freq: Every day | ORAL | 0 refills | Status: DC

## 2017-08-15 MED ORDER — CETIRIZINE HCL 10 MG PO TABS: 10.0000 mg | ORAL_TABLET | Freq: Every day | ORAL | 3 refills | Status: DC

## 2017-08-15 NOTE — Progress Notes (Signed)
MRN: [id number] DOB: [birth year]  Subjective:   [name] is a 77 y.o. female presenting for 2-week history of recurrent back pain of her entire spine and right hip pain.  Patient reports that her symptoms started while she was hospitalized for her manic episode.  She was started on Risperdal and reports that the pain was very closely associated with this.  She was subsequently changed to paliperidone injections once weekly and did not notice any particular change in her back pain.  She denies falls, trauma, weakness.  She believes she may have a history of bone thinning, osteopenia.  She does not have an orthopedist or PCP.  Patient is requesting refills of her albuterol inhaler, Zyrtec and Zantac.  Denies smoking cigarettes.  [name] has a current medication list which includes the following prescription(s): albuterol, cetirizine, cyclobenzaprine, homeopathic products, polyethylene glycol powder, ranitidine, acetaminophen, and tramadol. Also is allergic to haloperidol and aripiprazole.  [name]  has a past medical history of Anemia (1990), Anxiety, Arthritis (2004), Asthma (1995), Colon polyp, Complication of anesthesia (1966), Diverticulosis (2003), and GERD (gastroesophageal reflux disease). 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  Objective:   Vitals: BP (!) 174/108   Pulse 78   Temp (!) 97.5 F (36.4 C) (Oral)   Resp 18   Ht 5\' 3"  (1.6 m)   Wt 212 lb (96.2 kg)   SpO2 93%   BMI 37.55 kg/m   BP Readings from Last 3 Encounters:  08/15/17 (!) 174/108  07/29/17 (!) 143/71  04/21/17 (!) 140/59   Physical Exam  Constitutional: She is oriented to person, place, and time. She appears  well-developed and well-nourished.  Cardiovascular: Normal rate, regular rhythm and intact distal pulses. Exam reveals no gallop and no friction rub.  No murmur heard. Pulmonary/Chest: No respiratory distress. She has no wheezes. She has no rales.  Musculoskeletal:       Cervical back: She exhibits decreased range of motion (flexion, extension, rotation) and tenderness. She exhibits no swelling, no edema, no deformity and no spasm.       Back:  Neurological: She is alert and oriented to person, place, and time.  Psychiatric:  Argumentative behavior.    Assessment and Plan :   Chronic bilateral low back pain without sciatica - Plan: Ambulatory referral to Orthopedic Surgery  Acute bilateral thoracic back pain - Plan: DG Thoracic Spine 2 View, Ambulatory referral to Orthopedic Surgery  Right hip pain - Plan: DG HIP UNILAT W OR W/O PELVIS 2-3 VIEWS RIGHT, Ambulatory referral to Orthopedic Surgery  Lumbar degenerative disc disease - Plan: Ambulatory referral to Orthopedic Surgery  Thoracic degenerative disc disease - Plan: Ambulatory referral to Orthopedic Surgery  DDD (degenerative disc disease), cervical - Plan: Ambulatory referral to Orthopedic Surgery  Elevated blood pressure reading without diagnosis of hypertension  Class 2 obesity due to excess calories without serious comorbidity with body mass index (BMI) of 37.0 to 37.9 in adult  Gastroesophageal reflux disease, esophagitis presence not specified - Plan: ranitidine (ZANTAC) 150 MG tablet  Argumentative behavior  Reviewed many previous blood pressure readings that are consistent with essential hypertension.  Patient was very upset with this and was very reluctant to start any kind of blood pressure medications.  She eventually agreed to use amlodipine on the grounds that prednisone  can elevate her blood pressure.  We will be using 20 mg of prednisone for her arthritic type back pain.  Refills were provided to patient for her  albuterol, Zyrtec and Zantac.  I did counsel patient that she is having such severe low back pain that she may be better served by presenting to the ER.  She is very upset with this as well.  I counseled that pain of that severity requires more immediate attention and that we may not be able to manage that here.  She did agree to do x-rays of her back.  She also agreed to a referral with an orthopedist.  I recommended that she establish care with a PCP that she can agree with on her diagnoses and health care goals.  I will follow-up with patient once we obtain the radiology report.  [name], PA-C [facility] [phone] 08/15/2017  12:10 PM

## 2017-08-15 NOTE — Telephone Encounter (Signed)
Patient was seen in clinic today.

## 2017-08-15 NOTE — Patient Instructions (Addendum)
Please establish care with a PCP that you can work with and agree on your healthcare goals.  I have given you courtesy refills as I am not your PCP for your albuterol inhaler, zyrtec, reflux medicine.  I have placed a referral for you to get a consult with an orthopedist regarding your arthritis.    Degenerative Disk Disease Degenerative disk disease is a condition caused by the changes that occur in spinal disks as you grow older. Spinal disks are soft and compressible disks located between the bones of your spine (vertebrae). These disks act like shock absorbers. Degenerative disk disease can affect the whole spine. However, the neck and lower back are most commonly affected. Many changes can occur in the spinal disks with aging, such as:  The spinal disks may dry and shrink.  Small tears may occur in the tough, outer covering of the disk (annulus).  The disk space may become smaller due to loss of water.  Abnormal growths in the bone (spurs) may occur. This can put pressure on the nerve roots exiting the spinal canal, causing pain.  The spinal canal may become narrowed.  What increases the risk?  Being overweight.  Having a family history of degenerative disk disease.  Smoking.  There is increased risk if you are doing heavy lifting or have a sudden injury. What are the signs or symptoms? Symptoms vary from person to person and may include:  Pain that varies in intensity. Some people have no pain, while others have severe pain. The location of the pain depends on the part of your backbone that is affected. ? You will have neck or arm pain if a disk in the neck area is affected. ? You will have pain in your back, buttocks, or legs if a disk in the lower back is affected.  Pain that becomes worse while bending, reaching up, or with twisting movements.  Pain that may start gradually and then get worse as time passes. It may also start after a major or minor injury.  Numbness or  tingling in the arms or legs.  How is this diagnosed? Your health care provider will ask you about your symptoms and about activities or habits that may cause the pain. He or she may also ask about any injuries, diseases, or treatments you have had. Your health care provider will examine you to check for the range of movement that is possible in the affected area, to check for strength in your extremities, and to check for sensation in the areas of the arms and legs supplied by different nerve roots. You may also have:  An X-ray of the spine.  Other imaging tests, such as MRI.  How is this treated? Your health care provider will advise you on the best plan for treatment. Treatment may include:  Medicines.  Rehabilitation exercises.  Follow these instructions at home:  Follow proper lifting and walking techniques as advised by your health care provider.  Maintain good posture.  Exercise regularly as advised by your health care provider.  Perform relaxation exercises.  Change your sitting, standing, and sleeping habits as advised by your health care provider.  Change positions frequently.  Lose weight or maintain a healthy weight as advised by your health care provider.  Do not use any tobacco products, including cigarettes, chewing tobacco, or electronic cigarettes. If you need help quitting, ask your health care provider.  Wear supportive footwear.  Take medicines only as directed by your health care provider. Contact  a health care provider if:  Your pain does not go away within 1-4 weeks.  You have significant appetite or weight loss. Get help right away if:  Your pain is severe.  You notice weakness in your arms, hands, or legs.  You begin to lose control of your bladder or bowel movements.  You have fevers or night sweats. This information is not intended to replace advice given to you by your health care provider. Make sure you discuss any questions you have  with your health care provider. Document Released: 01/21/2007 Document Revised: 09/01/2015 Document Reviewed: 07/28/2013 Elsevier Interactive Patient Education  2018 [employer].     IF you received an x-ray today, you will receive an invoice from [facility]. Please contact [facility] at [phone] with questions or concerns regarding your invoice.   IF you received labwork today, you will receive an invoice from [facility]. Please contact [facility] at [phone] with questions or concerns regarding your invoice.   Our billing staff will not be able to assist you with questions regarding bills from these companies.  You will be contacted with the lab results as soon as they are available. The fastest way to get your results is to activate your My Chart account. Instructions are located on the last page of this paperwork. If you have not heard from [place] regarding the results in 2 weeks, please contact this office.

## 2017-08-15 NOTE — Telephone Encounter (Signed)
Pt being seen today - will ask for refill at visit.

## 2017-09-11 ENCOUNTER — Telehealth: Payer: Self-pay | Admitting: Urgent Care

## 2017-09-11 NOTE — Telephone Encounter (Signed)
Pt left vm for referrals stating she was still waiting to hear back about referral for ortho from our office. Pt was originally referred to [facility] but they did not take her insurance. Peter Congo spoke with pt last week and let her know we switched referral to [place]. Pt was advised she could call [facility] to set this up, and pt stated she had their phone number already. Referral is currently under review with [facility]. Tried calling pt to let her know she can call their office to schedule, and could not leave detailed vm due to no DPR on file. Asked pt to call us back to give her this info.

## 2017-09-17 ENCOUNTER — Encounter (HOSPITAL_COMMUNITY): Payer: Self-pay | Admitting: *Deleted

## 2017-09-17 ENCOUNTER — Emergency Department (HOSPITAL_COMMUNITY)
Admission: EM | Admit: 2017-09-17 | Discharge: 2017-09-18 | Disposition: A | Discharge status: Other Acute Inpatient Hospital | Payer: Medicare Other | Attending: Emergency Medicine | Admitting: Emergency Medicine

## 2017-09-17 DIAGNOSIS — Z01818 Encounter for other preprocedural examination: Secondary | ICD-10-CM

## 2017-09-17 DIAGNOSIS — Z79899 Other long term (current) drug therapy: Secondary | ICD-10-CM | POA: Diagnosis not present

## 2017-09-17 DIAGNOSIS — F22 Delusional disorders: Secondary | ICD-10-CM | POA: Diagnosis present

## 2017-09-17 DIAGNOSIS — J45998 Other asthma: Secondary | ICD-10-CM | POA: Diagnosis not present

## 2017-09-17 LAB — URINALYSIS, ROUTINE W REFLEX MICROSCOPIC
Bilirubin Urine: NEGATIVE
Glucose, UA: NEGATIVE mg/dL
Hgb urine dipstick: NEGATIVE
Ketones, ur: NEGATIVE mg/dL
Nitrite: NEGATIVE
Protein, ur: NEGATIVE mg/dL
SPECIFIC GRAVITY, URINE: 1.005 (ref 1.005–1.030)
pH: 6 (ref 5.0–8.0)

## 2017-09-17 LAB — CBC WITH DIFFERENTIAL/PLATELET
BASOS ABS: 0 10*3/uL (ref 0.0–0.1)
Basophils Relative: 1 %
EOS PCT: 3 %
Eosinophils Absolute: 0.2 10*3/uL (ref 0.0–0.7)
HCT: 40.9 % (ref 36.0–46.0)
Hemoglobin: 13.5 g/dL (ref 12.0–15.0)
LYMPHS ABS: 1.5 10*3/uL (ref 0.7–4.0)
LYMPHS PCT: 19 %
MCH: 29.9 pg (ref 26.0–34.0)
MCHC: 33 g/dL (ref 30.0–36.0)
MCV: 90.7 fL (ref 78.0–100.0)
MONO ABS: 0.8 10*3/uL (ref 0.1–1.0)
Monocytes Relative: 10 %
Neutro Abs: 5.4 10*3/uL (ref 1.7–7.7)
Neutrophils Relative %: 67 %
PLATELETS: 238 10*3/uL (ref 150–400)
RBC: 4.51 MIL/uL (ref 3.87–5.11)
RDW: 13.3 % (ref 11.5–15.5)
WBC: 7.7 10*3/uL (ref 4.0–10.5)

## 2017-09-17 LAB — ACETAMINOPHEN LEVEL: Acetaminophen (Tylenol), Serum: <10 ug/mL — ABNORMAL LOW (ref 10–30)

## 2017-09-17 LAB — COMPREHENSIVE METABOLIC PANEL
ALBUMIN: 3.6 g/dL (ref 3.5–5.0)
ALT: 19 U/L (ref 14–54)
AST: 21 U/L (ref 15–41)
Alkaline Phosphatase: 72 U/L (ref 38–126)
Anion gap: 10 (ref 5–15)
BILIRUBIN TOTAL: 0.5 mg/dL (ref 0.3–1.2)
BUN: 15 mg/dL (ref 6–20)
CHLORIDE: 103 mmol/L (ref 101–111)
CO2: 26 mmol/L (ref 22–32)
Calcium: 9 mg/dL (ref 8.9–10.3)
Creatinine, Ser: 1.12 mg/dL — ABNORMAL HIGH (ref 0.44–1.00)
GFR calc Af Amer: 54 mL/min — ABNORMAL LOW (ref >60)
GFR calc non Af Amer: 47 mL/min — ABNORMAL LOW (ref >60)
GLUCOSE: 124 mg/dL — AB (ref 65–99)
POTASSIUM: 4.1 mmol/L (ref 3.5–5.1)
SODIUM: 139 mmol/L (ref 135–145)
TOTAL PROTEIN: 5.8 g/dL — AB (ref 6.5–8.1)

## 2017-09-17 LAB — RAPID URINE DRUG SCREEN, HOSP PERFORMED
Amphetamines: NOT DETECTED
BARBITURATES: NOT DETECTED
BENZODIAZEPINES: NOT DETECTED
Cocaine: NOT DETECTED
Opiates: NOT DETECTED
Tetrahydrocannabinol: NOT DETECTED

## 2017-09-17 LAB — SALICYLATE LEVEL

## 2017-09-17 LAB — ETHANOL: Alcohol, Ethyl (B): <10 mg/dL (ref <10)

## 2017-09-17 MED ORDER — LORATADINE 10 MG PO TABS
10.0000 mg | ORAL_TABLET | Freq: Every day | ORAL | Status: DC
  Administered 2017-09-17 – 2017-09-18 (×2): 10 mg via ORAL
  Filled 2017-09-17 (×3): qty 1

## 2017-09-17 MED ORDER — FAMOTIDINE 20 MG PO TABS
10.0000 mg | ORAL_TABLET | Freq: Two times a day (BID) | ORAL | Status: DC
  Administered 2017-09-17 – 2017-09-18 (×2): 10 mg via ORAL
  Filled 2017-09-17 (×2): qty 1

## 2017-09-17 MED ORDER — HYDROXYZINE HCL 25 MG PO TABS
25.0000 mg | ORAL_TABLET | Freq: Three times a day (TID) | ORAL | Status: DC | PRN
Start: 2017-09-17 — End: 2017-09-18
  Administered 2017-09-18: 25 mg via ORAL
  Filled 2017-09-17: qty 1

## 2017-09-17 MED ORDER — TRAZODONE HCL 50 MG PO TABS: 50.0000 mg | ORAL_TABLET | Freq: Every evening | ORAL | Status: DC | PRN

## 2017-09-17 MED ORDER — AMLODIPINE BESYLATE 5 MG PO TABS
5.0000 mg | ORAL_TABLET | Freq: Every day | ORAL | Status: DC
  Administered 2017-09-17 – 2017-09-18 (×2): 5 mg via ORAL
  Filled 2017-09-17 (×2): qty 1

## 2017-09-17 NOTE — Progress Notes (Addendum)
CSW received a call from pt's RN stating family was interested in assistance with obtaining an FL-2 for placement and would like to speak to a CSW.  CSW reviewed pt's chart and noted pt was brought to the ED approx 4 times and was referred out for inpatient psychiatric hospitalization from the ED.  Per pt's niece Noralee Space at ph: [phone] (and work [phone]), pt's niece obtained guardianship today and provided the guardianshp papers to the ED to be scanned in to pt's file.  Per pt's niece Noralee Space, she is the pt's legal guardian now as of 09/17/17.  Pt's niece/guardian stated she would like an FL-2 in order to place the pt at [place].  Per pt's niece/guardian [facility] "has a bed for her and is holding a bed for her but they say I need an FL-2".    CSW stated that it is possible that pt may be referred out for inpatient psychiatric hospitalization and pt's niece/guardian asked if inpatient psychiatric hospitals can create FL-2's for placement if and when the pt is admitted and then D/C'd from one?  CSW stated that the CSW had done so when the CSW had worked at [employer] and that it is likely others can also, although the CSW (this Probation officer) cannot say for certain.  Pt's niece/guardian asked if the pt was NOT referred out for inpatient psychiatric hospitalization, was it possible that the [facility] ED CSW complete one and the CSW stated that this is not normal practice and that the CSW could consult leadership about options for the pt's niece/guardian should this situation arise.  CSW provided pt's niece/guardian with the CSW's phone number and stated that the CSW is standing by should pt not be referred out.  Pt's niece/guardian was appreciative and thanked the CSW.  Please reconsult if future social work needs arise.  CSW signing off, as social work intervention is no longer needed.  Alphonse Guild. Cristella Stiver, LCSW, LCAS, CSI Clinical Social  Worker Ph: [phone]

## 2017-09-17 NOTE — ED Notes (Signed)
Per off going RN, pt has never been married or had any children.
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
## 2017-09-17 NOTE — ED Notes (Signed)
Pt stated "I had a baby here & it was in the NICU.  I came here to have my babies but they don't have that [facility] anymore do they?  [facility]

## 2017-09-17 NOTE — ED Notes (Signed)
Questioned pt if she had taken her Norvasc today; pt denied.

## 2017-09-17 NOTE — ED Notes (Signed)
TTS assessment in progress. 

## 2017-09-17 NOTE — ED Provider Notes (Signed)
[facility] DEPT Provider Note   CSN: [id number] Arrival date & time: 09/17/17  1803     History   Chief Complaint Chief Complaint  Patient presents with  . IVC  . Delusional    HPI [name] is a 77 y.o. female.  HPI Patient was IVC by her niece.  She lives alone is been calling the police because she is concerned that the pope is trying to kill her.  She believes the [employer] and [employer] are working to protect her.  Patient was not married states that her husband has been visiting her at night for many years and that they are having 5 children through a surrogate.  She also is concerned that her husband is destroying her outdoor furniture and punching holes in her screen porch.  She has no suicidal ideation. Past Medical History:  Diagnosis Date  . Anemia 1990  . Anxiety   . Arthritis 2004  . Asthma 1995  . Colon polyp   . Complication of anesthesia 1966   first surgery she had- had a hard time waking up from surgery  . Diverticulosis 2003  . GERD (gastroesophageal reflux disease)     Patient Active Problem List   Diagnosis Date Noted  . Primary osteoarthritis of left knee 04/04/2016  . 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 Anxiety 11/15/2015  . Gastroesophageal reflux disease without esophagitis 11/15/2015  . Solitary right kidney 07/07/2015  . Urge incontinence of urine 07/07/2015  . Asthma 12/07/2011  . Arthritis 12/07/2011  . 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 Obesity  12/07/2011  . Incisional hernia 03/29/2011  . DIVERTICULOSIS-COLON 12/01/2008  . ABDOMINAL PAIN-RLQ 12/01/2008    Past Surgical History:  Procedure Laterality Date  . APPENDECTOMY  1966  . CATARACT EXTRACTION     Bilateral  . CHOLECYSTECTOMY  2004  . COLON SURGERY  2008  . COLOSTOMY    . COLOSTOMY TAKEDOWN    . DIAGNOSTIC LAPAROSCOPY     exploratory for endometriosis  . HEEL SPUR SURGERY     Right, Metal Plate in Heel  . [place]  2010  . NEPHRECTOMY Left 6063   As complication of partial colectomy for diverticulitis  . TONSILLECTOMY    . 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   Procedure: LEFT TOTAL KNEE ARTHROPLASTY;  Surgeon: Susa Day, MD;  Location: [facility] ORS;  Service: Orthopedics;  Laterality: Left;  Adductor Block     OB History   None      Home Medications    Prior to Admission medications   Medication Sig Start Date End Date Taking? Authorizing Provider  albuterol (PROVENTIL HFA;VENTOLIN HFA) 108 (90 Base) MCG/ACT inhaler Inhale 2 puffs into the lungs every 6 (six) hours as needed for wheezing or shortness of breath. 08/15/17  Yes [name], PA-C  amLODipine (NORVASC) 5 MG tablet Take 1 tablet (5 mg total) by mouth daily. 08/15/17  Yes [name], PA-C  cetirizine (ZYRTEC) 10 MG tablet Take 1 tablet (10 mg total) by mouth daily. 08/15/17  Yes [name], PA-C  hydrOXYzine (ATARAX/VISTARIL) 25 MG tablet Take 25 mg by mouth 3 (three) times daily as needed for anxiety.   Yes [provider]  ibuprofen (ADVIL,MOTRIN) 800 MG tablet Take 1,600 mg by mouth daily as needed (pain).   Yes [provider]  paliperidone (INVEGA SUSTENNA) 156 MG/ML SUSY injection Inject 1 application into the muscle every 30 (thirty) days. 09/12/17 09/17/17 Yes [provider]  ranitidine (ZANTAC) 150 MG tablet Take 1 tablet (150 mg total) by mouth 2 (two) times daily as needed for heartburn. 08/15/17  Yes [name], PA-C  traMADol (ULTRAM) 50 MG tablet Take 50 mg by mouth daily as needed for moderate pain.    Yes [provider]  traZODone (DESYREL) 50 MG tablet Take 50 mg by mouth at bedtime as needed for sleep.   Yes [provider]  cyclobenzaprine (FLEXERIL) 5 MG tablet Take 1 tablet (5 mg total) by mouth at bedtime. Patient not taking: Reported on 09/17/2017 11/26/16   [name], PA-C  polyethylene glycol powder (GLYCOLAX/MIRALAX) powder Take 17 g by mouth 2 (two) times daily as  needed. Patient not taking: Reported on 09/17/2017 11/01/16   McVey, Gelene Mink, PA-C  predniSONE (DELTASONE) 20 MG tablet Take 1 tablet (20 mg total) by mouth daily with breakfast. Patient not taking: Reported on 09/17/2017 08/15/17   [name], PA-C    Family History Family History  Problem Relation Age of Onset  . Colon polyps Other        niece  . Diabetes Paternal Grandmother   . Diabetes Paternal Aunt   . Heart failure Mother   . Heart attack Father     Social History Social History   Tobacco Use  . Smoking status: Never Smoker  . Smokeless tobacco: Never Used  Substance Use Topics  . Alcohol use: Yes    Comment: occassionally  . Drug use: No     Allergies   Haloperidol and Aripiprazole   Review of Systems Review of Systems  Constitutional: Negative for chills and fever.  Eyes: Negative for visual disturbance.  Respiratory: Negative for cough and shortness of breath.   Cardiovascular: Negative for chest pain.  Gastrointestinal: Positive for abdominal pain. Negative for diarrhea, nausea and vomiting.  Genitourinary: Negative for dysuria, flank pain and frequency.  Musculoskeletal: Negative for back pain, myalgias and neck pain.  Skin: Negative for rash and wound.  Neurological: Negative for weakness, numbness and headaches.  Psychiatric/Behavioral: Negative for suicidal ideas. The patient is nervous/anxious.   All other systems reviewed and are negative.    Physical Exam Updated Vital Signs BP 134/76 (BP Location: Right Arm)   Pulse 75   Temp 98 F (36.7 C) (Oral)   Resp 18   SpO2 94%   Physical Exam  Constitutional: She is oriented to person, place, and time. She appears well-developed and well-nourished. No distress.  HENT:  Head: Normocephalic and atraumatic.  Mouth/Throat: Oropharynx is clear and moist. No oropharyngeal exudate.  Eyes: Pupils are equal, round, and reactive to light. EOM are normal.  Neck: Normal range of motion. Neck  supple.  Cardiovascular: Normal rate and regular rhythm. Exam reveals no gallop and no friction rub.  No murmur heard. Pulmonary/Chest: Effort normal and breath sounds normal.  Abdominal: Soft. Bowel sounds are normal. There is no tenderness. There is no rebound and no guarding.  Musculoskeletal: Normal range of motion. She exhibits no edema or tenderness.  Neurological: She is alert and oriented to person, place, and time.  Moves all extremities without focal deficit.  Sensation intact.  Skin: Skin is warm and dry. No rash noted. She is not diaphoretic. No erythema.  Psychiatric:  Patient demonstrates tangential thinking and paranoia.  Does not appear to be to responding to internal stimuli.  Nursing note and vitals reviewed.    ED Treatments / Results  Labs (all labs ordered are listed, but only abnormal  results are displayed) Labs Reviewed  COMPREHENSIVE METABOLIC PANEL - Abnormal; Notable for the following components:      Result Value   Glucose, Bld 124 (*)    Creatinine, Ser 1.12 (*)    Total Protein 5.8 (*)    GFR calc non Af Amer 47 (*)    GFR calc Af Amer 54 (*)    All other components within normal limits  ACETAMINOPHEN LEVEL - Abnormal; Notable for the following components:   Acetaminophen (Tylenol), Serum <10 (*)    All other components within normal limits  URINALYSIS, ROUTINE W REFLEX MICROSCOPIC - Abnormal; Notable for the following components:   Color, Urine STRAW (*)    Leukocytes, UA TRACE (*)    Bacteria, UA RARE (*)    All other components within normal limits  ETHANOL  RAPID URINE DRUG SCREEN, HOSP PERFORMED  CBC WITH DIFFERENTIAL/PLATELET  SALICYLATE LEVEL    EKG None  Radiology No results found.  Procedures Procedures (including critical care time)  Medications Ordered in ED Medications  amLODipine (NORVASC) tablet 5 mg (5 mg Oral Given 09/17/17 2209)  loratadine (CLARITIN) tablet 10 mg (10 mg Oral Given 09/17/17 2209)  hydrOXYzine  (ATARAX/VISTARIL) tablet 25 mg (has no administration in time range)  famotidine (PEPCID) tablet 10 mg (10 mg Oral Given 09/17/17 2209)  traZODone (DESYREL) tablet 50 mg (has no administration in time range)     Initial Impression / Assessment and Plan / ED Course  I have reviewed the triage vital signs and the nursing notes.  Pertinent labs & imaging results that were available during my care of the patient were reviewed by me and considered in my medical decision making (see chart for details).     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

## 2017-09-17 NOTE — ED Triage Notes (Signed)
Pt IVC'd, paperwork states pt is hearing voices, believes [employer] and [employer] are protecting her from the pope, who is trying to kill her. Pt is calling police frequently to make sure no one is trying to kill her. Pt became aggressive earlier today at a competency hearing and can often become aggressive towards family.

## 2017-09-17 NOTE — ED Notes (Signed)
Pt awake stating "I'm not a danger to myself or others.  That's a lie.  I can't sleep here.  I need total darkness.  They'll be bringing babies in here tonight."  Pt repositioned in bed, offered snack.

## 2017-09-18 ENCOUNTER — Emergency Department (HOSPITAL_COMMUNITY): Payer: Medicare Other

## 2017-09-18 DIAGNOSIS — F22 Delusional disorders: Secondary | ICD-10-CM

## 2017-09-18 DIAGNOSIS — R4689 Other symptoms and signs involving appearance and behavior: Secondary | ICD-10-CM | POA: Insufficient documentation

## 2017-09-18 LAB — CBG MONITORING, ED: Glucose-Capillary: 112 mg/dL — ABNORMAL HIGH (ref 65–99)

## 2017-09-18 MED ORDER — PALIPERIDONE ER 6 MG PO TB24
6.0000 mg | ORAL_TABLET | Freq: Every day | ORAL | Status: DC
  Administered 2017-09-18: 6 mg via ORAL
  Filled 2017-09-18: qty 1

## 2017-09-18 NOTE — Progress Notes (Signed)
Patient ID: [name], female   DOB: Jun 03, 1940, 77 y.o.   MRN: [id number]  Reviewed chart for consideration for transfer to [facility]. Pt not appropriate for our unit. She needs inpatient geropsych referral.

## 2017-09-18 NOTE — BH Assessment (Addendum)
[facility] Assessment Progress Note  Per Buford Dresser, DO, this pt requires psychiatric hospitalization at this time.  Pt presents under IVC initiated by pt's niece/legal guardian, Noralee Space, which Dr Mariea Clonts has upheld.  At 15:34 Gerald Stabs calls from [facility] to report that pt has been accepted to their facility by Dr Caren Hazy to the [facility], Rm 147-1.  EDP Ezzie Dural, MD concurs with this decision.  Pt's nurse, Ginger, has been notified, and agrees to call report to [phone].  [facility] stipulates that pt must arrive either before 23:00 tonight, or after 06:00 tomorrow.  Pt is to be transported via [facility]  [place] Coordinator [phone]   Addendum:  At 16:00 this writer called Noralee Space ([phone]) and notified her of pt's disposition.  Jalene Mullet, [place] Coordinator [phone]

## 2017-09-18 NOTE — ED Notes (Signed)
Patient currently asleep with no signs of distress noted currently. Patient with sitter at bedside for safety. Awaiting officer transportation at this time.

## 2017-09-18 NOTE — ED Notes (Signed)
Pt stated "I've changed one of the babies names.  I've changed it to [employer].  I don't really like Wilber Oliphant, that was my grandfather's name.  Mayra Reel is 1 of Heather's son's names but nobody knows his name is Hart Carwin.  They call him Lucious Groves.  We interchange family names."

## 2017-09-18 NOTE — Progress Notes (Signed)
Called sheriff's office and left message to arrange transport to [facility].

## 2017-09-18 NOTE — ED Provider Notes (Signed)
Patient going to [facility].  Tele-completed.  Accepting physician is [place].   Fredia Sorrow, MD 09/18/17 2043

## 2017-09-18 NOTE — ED Provider Notes (Signed)
Counselor Jalene Mullet has advised me that the patient is excepted at [facility] geriatric psychiatric treatment.  Patient per history has been severely delusional and not safe at home to manage herself.  Patient is alert sitting in the chair.  No acute distress.  Color is good.  Movements are coordinated purposeful and symmetric.  Patient's diagnostic results reviewed.  Vital signs stable.  Patient stable for transfer.   Charlesetta Shanks, MD 09/18/17 1601

## 2017-09-18 NOTE — Consult Note (Addendum)
[facility] Consult   Reason for Consult:  Delusions  Referring Physician:  EDP Patient Identification: [name] MRN:  [id number] Principal Diagnosis: Delusional disorder, persecutory type [facility]) Diagnosis:   Patient Active Problem List   Diagnosis Date Noted  . Delusional disorder, 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[place]) [F22] 09/18/2017    Priority: High  . Primary osteoarthritis of left knee [M17.12] 04/04/2016  . Left knee DJD [M17.12] 04/04/2016  . Anxiety [F41.9] 11/15/2015  . Gastroesophageal reflux disease without esophagitis [K21.9] 11/15/2015  . Solitary right kidney [Q60.0] 07/07/2015  . Urge incontinence of urine [N39.41] 07/07/2015  . Asthma [J45.909] 12/07/2011  . Arthritis [M19.90] 12/07/2011  . 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 Obesity  [E66.9] 12/07/2011  . Incisional hernia [K43.2] 03/29/2011  . DIVERTICULOSIS-COLON [K57.30] 12/01/2008  . ABDOMINAL PAIN-RLQ [R10.31] 12/01/2008    Total Time spent with patient: 45 minutes  Subjective:   DELANDA BULLUCK is a 77 y.o. female patient admitted with delusions.  HPI:  77 yo female who presented to the ED after calling police thinking the [employer] and [employer] are trying to kill her.  Reports she has a husband and babies that she needs to get back to, not the case for either.  She was at [facility] in April for similar issues.  Paranoid that people are trying to kill her.  No suicidal/homicidal ideations or substance abuse.  Small amount of leukocytes and bacteria in her urine but not large enough to treat.    Past Psychiatric History: delusional disorder  Risk to Self: Suicidal Ideation: No Suicidal Intent: No Is patient at risk for suicide?: No Suicidal Plan?: No Access to Means: No What has been your use of drugs/alcohol within the last 12 months?: none How many times?: 0 Other Self Harm Risks: none Triggers for Past Attempts: None known Intentional Self Injurious Behavior:  None Risk to Others: Homicidal Ideation: No Thoughts of Harm to Others: No Current Homicidal Intent: No Current Homicidal Plan: No Access to Homicidal Means: No Identified Victim: none History of harm to others?: No Assessment of Violence: None Noted Violent Behavior Description: NA Does patient have access to weapons?: No Criminal Charges Pending?: No Does patient have a court date: No Prior Inpatient Therapy: Prior Inpatient Therapy: Yes Prior Therapy Dates: 2019, 2005, 2006 Prior Therapy Facilty/Provider(s): [facility] [place], [place] Reason for Treatment: delusional Prior Outpatient Therapy: Prior Outpatient Therapy: Yes Prior Therapy Dates: current Prior Therapy Facilty/Provider(s): [facility] Reason for Treatment: delusional Does patient have an ACCT team?: No Does patient have Intensive In-House Services?  : No Does patient have [facility] services? : Yes Does patient have P4CC services?: No  Past Medical History:  Past Medical History:  Diagnosis Date  . Anemia 1990  . Anxiety   . Arthritis 2004  . Asthma 1995  . Colon polyp   . Complication of anesthesia 1966   first surgery she had- had a hard time waking up from surgery  . Diverticulosis 2003  . GERD (gastroesophageal reflux disease)     Past Surgical History:  Procedure Laterality Date  . APPENDECTOMY  1966  . CATARACT EXTRACTION     Bilateral  . CHOLECYSTECTOMY  2004  . COLON SURGERY  2008  . COLOSTOMY    . COLOSTOMY TAKEDOWN    . DIAGNOSTIC LAPAROSCOPY     exploratory for endometriosis  . HEEL SPUR SURGERY     Right, Metal Plate in Heel  . [place]  2010  . NEPHRECTOMY  Left 4128   As complication of partial colectomy for diverticulitis  . TONSILLECTOMY    . 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   Procedure: LEFT TOTAL KNEE ARTHROPLASTY;  Surgeon: Susa Day, MD;  Location: [facility];  Service: Orthopedics;  Laterality: Left;  Adductor Block   Family History:  Family History  Problem  Relation Age of Onset  . Colon polyps Other        niece  . Diabetes Paternal Grandmother   . Diabetes Paternal Aunt   . Heart failure Mother   . Heart attack Father    Family Psychiatric  History: none Social History:  Social History   Substance and Sexual Activity  Alcohol Use Yes   Comment: occassionally     Social History   Substance and Sexual Activity  Drug Use No    Social History   Socioeconomic History  . Marital status: Legally Separated    Spouse name: Not on file  . Number of children: 10  . Years of education: Not on file  . Highest education level: Not on file  Occupational History  . Occupation: retired  Scientific laboratory technician  . Financial resource strain: Not on file  . Food insecurity:    Worry: Not on file    Inability: Not on file  . Transportation needs:    Medical: Not on file    Non-medical: Not on file  Tobacco Use  . Smoking status: Never Smoker  . Smokeless tobacco: Never Used  Substance and Sexual Activity  . Alcohol use: Yes    Comment: occassionally  . Drug use: No  . Sexual activity: Not Currently  Lifestyle  . Physical activity:    Days per week: Not on file    Minutes per session: Not on file  . Stress: Not on file  Relationships  . Social connections:    Talks on phone: Not on file    Gets together: Not on file    Attends religious service: Not on file    Active member of club or organization: Not on file    Attends meetings of clubs or organizations: Not on file    Relationship status: Not on file  Other Topics Concern  . Not on file  Social History Narrative  . Not on file   Additional Social History: N/A    Allergies:   Allergies  Allergen Reactions  . Haloperidol Anaphylaxis  . Aripiprazole Other (See Comments)    Bad thoughts     Labs:  Results for orders placed or performed during the hospital encounter of 09/17/17 (from the past 48 hour(s))  Comprehensive metabolic panel     Status: Abnormal   Collection Time:  09/17/17  8:04 PM  Result Value Ref Range   Sodium 139 135 - 145 mmol/L   Potassium 4.1 3.5 - 5.1 mmol/L   Chloride 103 101 - 111 mmol/L   CO2 26 22 - 32 mmol/L   Glucose, Bld 124 (H) 65 - 99 mg/dL   BUN 15 6 - 20 mg/dL   Creatinine, Ser 1.12 (H) 0.44 - 1.00 mg/dL   Calcium 9.0 8.9 - 10.3 mg/dL   Total Protein 5.8 (L) 6.5 - 8.1 g/dL   Albumin 3.6 3.5 - 5.0 g/dL   AST 21 15 - 41 U/L   ALT 19 14 - 54 U/L   Alkaline Phosphatase 72 38 - 126 U/L   Total Bilirubin 0.5 0.3 - 1.2 mg/dL   GFR calc non Af Amer 47 (L) >  60 mL/min   GFR calc Af Amer 54 (L) >60 mL/min    Comment: (NOTE) The eGFR has been calculated using the CKD EPI equation. This calculation has not been validated in all clinical situations. eGFR's persistently <60 mL/min signify possible Chronic Kidney Disease.    Anion gap 10 5 - 15    Comment: Performed at [facility], [place] [address], [place], [place] [address]  Ethanol     Status: None   Collection Time: 09/17/17  8:04 PM  Result Value Ref Range   Alcohol, Ethyl (B) <10 <10 mg/dL    Comment: (NOTE) Lowest detectable limit for serum alcohol is 10 mg/dL. For medical purposes only. Performed at [facility], [place] [address] [place], [place] [address]   CBC with Diff     Status: None   Collection Time: 09/17/17  8:04 PM  Result Value Ref Range   WBC 7.7 4.0 - 10.5 K/uL   RBC 4.51 3.87 - 5.11 MIL/uL   Hemoglobin 13.5 12.0 - 15.0 g/dL   HCT 40.9 36.0 - 46.0 %   MCV 90.7 78.0 - 100.0 fL   MCH 29.9 26.0 - 34.0 pg   MCHC 33.0 30.0 - 36.0 g/dL   RDW 13.3 11.5 - 15.5 %   Platelets 238 150 - 400 K/uL   Neutrophils Relative % 67 %   Neutro Abs 5.4 1.7 - 7.7 K/uL   Lymphocytes Relative 19 %   Lymphs Abs 1.5 0.7 - 4.0 K/uL   Monocytes Relative 10 %   Monocytes Absolute 0.8 0.1 - 1.0 K/uL   Eosinophils Relative 3 %   Eosinophils Absolute 0.2 0.0 - 0.7 K/uL   Basophils Relative 1 %   Basophils Absolute 0.0 0.0 - 0.1 K/uL     Comment: Performed at [facility], [place] [address] [place], [place] [address]  Salicylate level     Status: None   Collection Time: 09/17/17  8:04 PM  Result Value Ref Range   Salicylate Lvl <9.1 2.8 - 30.0 mg/dL    Comment: Performed at [facility], [place] [address], [place], [place] [address]  Acetaminophen level     Status: Abnormal   Collection Time: 09/17/17  8:04 PM  Result Value Ref Range   Acetaminophen (Tylenol), Serum <10 (L) 10 - 30 ug/mL    Comment: (NOTE) Therapeutic concentrations vary significantly. A range of 10-30 ug/mL  may be an effective concentration for many patients. However, some  are best treated at concentrations outside of this range. Acetaminophen concentrations >150 ug/mL at 4 hours after ingestion  and >50 ug/mL at 12 hours after ingestion are often associated with  toxic reactions. Performed at [facility], [place] [address] [place], [place] [address]   Urine rapid drug screen (hosp performed)     Status: None   Collection Time: 09/17/17  8:29 PM  Result Value Ref Range   Opiates NONE DETECTED NONE DETECTED   Cocaine NONE DETECTED NONE DETECTED   Benzodiazepines NONE DETECTED NONE DETECTED   Amphetamines NONE DETECTED NONE DETECTED   Tetrahydrocannabinol NONE DETECTED NONE DETECTED   Barbiturates NONE DETECTED NONE DETECTED    Comment: (NOTE) DRUG SCREEN FOR MEDICAL PURPOSES ONLY.  IF CONFIRMATION IS NEEDED FOR ANY PURPOSE, NOTIFY LAB WITHIN 5 DAYS. LOWEST DETECTABLE LIMITS FOR URINE DRUG SCREEN Drug Class                     Cutoff (ng/mL)  Amphetamine and metabolites    1000 Barbiturate and metabolites    200 Benzodiazepine                 200 Tricyclics and metabolites     300 Opiates and metabolites        300 Cocaine and metabolites        300 THC                            50 Performed at [facility], [address] [place], [place] [address]   Urinalysis,  Routine w reflex microscopic     Status: Abnormal   Collection Time: 09/17/17  8:29 PM  Result Value Ref Range   Color, Urine STRAW (A) YELLOW   APPearance CLEAR CLEAR   Specific Gravity, Urine 1.005 1.005 - 1.030   pH 6.0 5.0 - 8.0   Glucose, UA NEGATIVE NEGATIVE mg/dL   Hgb urine dipstick NEGATIVE NEGATIVE   Bilirubin Urine NEGATIVE NEGATIVE   Ketones, ur NEGATIVE NEGATIVE mg/dL   Protein, ur NEGATIVE NEGATIVE mg/dL   Nitrite NEGATIVE NEGATIVE   Leukocytes, UA TRACE (A) NEGATIVE   RBC / HPF 0-5 0 - 5 RBC/hpf   WBC, UA 0-5 0 - 5 WBC/hpf   Bacteria, UA RARE (A) NONE SEEN   Squamous Epithelial / LPF 0-5 0 - 5    Comment: Performed at [facility], [address] [place], [place] [address]  CBG monitoring, ED     Status: Abnormal   Collection Time: 09/18/17  8:20 AM  Result Value Ref Range   Glucose-Capillary 112 (H) 65 - 99 mg/dL    Current Facility-Administered Medications  Medication Dose Route Frequency Provider Last Rate Last Dose  . amLODipine (NORVASC) tablet 5 mg  5 mg Oral Daily Yelverton, David, MD   5 mg at 09/18/17 0950  . famotidine (PEPCID) tablet 10 mg  10 mg Oral BID Yelverton, David, MD   10 mg at 09/18/17 0949  . hydrOXYzine (ATARAX/VISTARIL) tablet 25 mg  25 mg Oral TID PRN Yelverton, David, MD      . loratadine (CLARITIN) tablet 10 mg  10 mg Oral Daily Yelverton, David, MD   10 mg at 09/18/17 0949  . traZODone (DESYREL) tablet 50 mg  50 mg Oral QHS PRN Yelverton, David, MD       Current Outpatient Medications  Medication Sig Dispense Refill  . albuterol (PROVENTIL HFA;VENTOLIN HFA) 108 (90 Base) MCG/ACT inhaler Inhale 2 puffs into the lungs every 6 (six) hours as needed for wheezing or shortness of breath. 18 g 1  . amLODipine (NORVASC) 5 MG tablet Take 1 tablet (5 mg total) by mouth daily. 30 tablet 0  . cetirizine (ZYRTEC) 10 MG tablet Take 1 tablet (10 mg total) by mouth daily. 90 tablet 3  . hydrOXYzine (ATARAX/VISTARIL) 25 MG tablet  Take 25 mg by mouth 3 (three) times daily as needed for anxiety.    . ibuprofen (ADVIL,MOTRIN) 800 MG tablet Take 1,600 mg by mouth daily as needed (pain).    . paliperidone (INVEGA SUSTENNA) 156 MG/ML SUSY injection Inject 1 application into the muscle every 30 (thirty) days.    . ranitidine (ZANTAC) 150 MG tablet Take 1 tablet (150 mg total) by mouth 2 (two) times daily as needed for heartburn. 60 tablet 5  . traMADol (ULTRAM) 50 MG tablet Take 50 mg by mouth daily as needed for moderate pain.     .   traZODone (DESYREL) 50 MG tablet Take 50 mg by mouth at bedtime as needed for sleep.    . cyclobenzaprine (FLEXERIL) 5 MG tablet Take 1 tablet (5 mg total) by mouth at bedtime. (Patient not taking: Reported on 09/17/2017) 90 tablet 1  . polyethylene glycol powder (GLYCOLAX/MIRALAX) powder Take 17 g by mouth 2 (two) times daily as needed. (Patient not taking: Reported on 09/17/2017) 578 g 1  . predniSONE (DELTASONE) 20 MG tablet Take 1 tablet (20 mg total) by mouth daily with breakfast. (Patient not taking: Reported on 09/17/2017) 7 tablet 0    Musculoskeletal: Strength & Muscle Tone: within normal limits Gait & Station: normal Patient leans: N/A  Psychiatric Specialty Exam: Physical Exam  Nursing note and vitals reviewed. Constitutional: She appears well-developed and well-nourished.  HENT:  Head: Normocephalic.  Neck: Normal range of motion.  Respiratory: Effort normal.  Musculoskeletal: Normal range of motion.  Neurological: She is alert.  Psychiatric: Her speech is normal and behavior is normal. Her mood appears anxious. Thought content is delusional. Cognition and memory are impaired. She expresses inappropriate judgment.    Review of Systems  Psychiatric/Behavioral: Positive for memory loss.  All other systems reviewed and are negative.   Blood pressure (!) 148/87, pulse 79, temperature 97.6 F (36.4 C), temperature source Oral, resp. rate 17, SpO2 95 %.There is no height or weight on  file to calculate BMI.  General Appearance: Casual  Eye Contact:  Good  Speech:  Normal Rate  Volume:  Normal  Mood:  Anxious  Affect:  Blunt  Thought Process:  Coherent and Descriptions of Associations: Intact  Orientation:  Other:  person  Thought Content:  Delusions and Paranoid Ideation  Suicidal Thoughts:  No  Homicidal Thoughts:  No  Memory:  Immediate;   Fair Recent;   Fair Remote;   Fair  Judgement:  Impaired  Insight:  Lacking  Psychomotor Activity:  EPS  Concentration:  Concentration: Fair and Attention Span: Fair  Recall:  [employer] of Knowledge:  Fair  Language:  Good  Akathisia:  No  Handed:  Right  AIMS (if indicated):   N/A  Assets:  Housing Leisure Time Physical Health Resilience Social Support  ADL's:  Intact  Cognition:  Impaired,  Mild  Sleep:   N/A     Treatment Plan Summary: Daily contact with patient to assess and evaluate symptoms and progress in treatment, Medication management and Plan delusional disorder, perscustory type:  -Crisis stabilization -Medication management:  Medical medications restarted and started Vistaril 25 mg TID PRN anxiety and Trazodone 50 mg at bedtime PRN sleep and Invega 6 mg daily for psychosis -Individual counseling  Disposition: Recommend psychiatric Inpatient admission when medically cleared.  Waylan Boga, NP 09/18/2017 1:32 PM   Patient seen face-to-face for psychiatric evaluation, chart reviewed and case discussed with the physician extender and developed treatment plan. Reviewed the information documented and agree with the treatment plan.  Buford Dresser, DO 09/18/17 3:58 PM

## 2017-09-18 NOTE — ED Notes (Signed)
Pt stated "Did you see the babies?  They were placed all around me."  Pt then gave 5 names of the babies.  Informed pt no babies have been in the department.  Pt then stated "they all came from 1 egg."

## 2017-09-19 DIAGNOSIS — F200 Paranoid schizophrenia: Secondary | ICD-10-CM | POA: Insufficient documentation

## 2017-09-26 ENCOUNTER — Telehealth: Payer: Self-pay | Admitting: Family Medicine

## 2017-09-26 NOTE — Telephone Encounter (Signed)
[facility] has contacted pt multiple times to schedule w/ no return call and voicemail is full

## 2017-10-02 DIAGNOSIS — M545 Low back pain, unspecified: Secondary | ICD-10-CM | POA: Insufficient documentation

## 2017-10-02 DIAGNOSIS — I10 Essential (primary) hypertension: Secondary | ICD-10-CM | POA: Insufficient documentation

## 2017-11-11 ENCOUNTER — Telehealth: Payer: Self-pay | Admitting: Urgent Care

## 2017-11-11 NOTE — Telephone Encounter (Signed)
Copied from [facility] [phone]. Topic: Referral - Status >> Nov 11, 2017  3:20 PM Robina Ade, Helene Kelp D wrote: Reason for CRM: Pt said that [facility] and [facility] are not in her network and pt needs someone in her network. She would like to talk to someone about this. Please call patient back, thanks.

## 2017-11-12 NOTE — Telephone Encounter (Signed)
I spoke with pt and advised her to call her insurance customer service number on back of her card and ask them who they are in network with so we can send the referral to the correct office. She verbalized understanding and stated she will give them a call

## 2017-11-14 NOTE — Telephone Encounter (Signed)
Pt called back and states to please hold off on this referral.  Pt is in an assisted living facility and they are making other arrangements for the pt.

## 2017-11-14 NOTE — Telephone Encounter (Signed)
Patient contacted her insurance company and states that her insurance company will cover the following 2 places.  Lanice Shirts at the Orthopedic office on [facility] CB# [place] [address] CB# [phone]   Please advise.

## 2017-12-13 ENCOUNTER — Other Ambulatory Visit: Payer: Self-pay

## 2017-12-13 ENCOUNTER — Emergency Department (HOSPITAL_COMMUNITY)
Admission: EM | Admit: 2017-12-13 | Discharge: 2017-12-13 | Disposition: A | Discharge status: Skilled Nursing Facility | Payer: Medicare Other | Attending: Emergency Medicine | Admitting: Emergency Medicine

## 2017-12-13 DIAGNOSIS — X500XXA Overexertion from strenuous movement or load, initial encounter: Secondary | ICD-10-CM | POA: Diagnosis not present

## 2017-12-13 DIAGNOSIS — Y939 Activity, unspecified: Secondary | ICD-10-CM | POA: Insufficient documentation

## 2017-12-13 DIAGNOSIS — Y929 Unspecified place or not applicable: Secondary | ICD-10-CM | POA: Insufficient documentation

## 2017-12-13 DIAGNOSIS — S76012A Strain of muscle, fascia and tendon of left hip, initial encounter: Secondary | ICD-10-CM | POA: Insufficient documentation

## 2017-12-13 DIAGNOSIS — Y999 Unspecified external cause status: Secondary | ICD-10-CM | POA: Diagnosis not present

## 2017-12-13 DIAGNOSIS — Z79899 Other long term (current) drug therapy: Secondary | ICD-10-CM | POA: Insufficient documentation

## 2017-12-13 DIAGNOSIS — M25552 Pain in left hip: Secondary | ICD-10-CM | POA: Diagnosis present

## 2017-12-13 MED ORDER — CYCLOBENZAPRINE HCL 5 MG PO TABS: 5.0000 mg | ORAL_TABLET | Freq: Three times a day (TID) | ORAL | 0 refills | Status: DC | PRN

## 2017-12-13 MED ORDER — IBUPROFEN 800 MG PO TABS: 800.0000 mg | ORAL_TABLET | Freq: Three times a day (TID) | ORAL | 0 refills | Status: DC | PRN

## 2017-12-13 MED ORDER — TRAMADOL HCL 50 MG PO TABS
50.0000 mg | ORAL_TABLET | Freq: Once | ORAL | Status: AC
  Administered 2017-12-13: 50 mg via ORAL
  Filled 2017-12-13: qty 1

## 2017-12-13 NOTE — ED Notes (Signed)
Pt states she has a ride coming to get her now, PTAR aware

## 2017-12-13 NOTE — ED Provider Notes (Signed)
[facility] DEPT Provider Note   CSN: [id number] Arrival date & time: 12/13/17  1233     History   Chief Complaint Chief Complaint  Patient presents with  . Leg Pain  . Hip Pain    HPI [name] is a 77 y.o. female.  The history is provided by the patient. No language interpreter was used.  Leg Pain   Pertinent negatives include no numbness.  Hip Pain      77 year old female with history of osteoarthritis brought here via EMS for evaluation of leg pain.  Patient report approximately 2 hours ago, she was reaching down to pick up an object when she felt a sharp pain to her left leg from her thigh up towards her hip.  Pain is intense, moderate in severity and improves with rest.  She mentioned that to her nurse who recommend patient to come to the office for evaluation.  She did take some Tylenol initially with minimal improvement.  At this time while laying still, she denies having any significant pain.  She denies falling down the ground or have any loss of consciousness.  She denies any precipitating symptoms prior to the injury.  She denies any new numbness or weakness, and denies back pain.  No bowel bladder incontinence.  No lightheadedness or dizziness no chest pain or shortness of breath.  She has had prior left knee replacement and does endorse some mild discomfort to her left knee  Past Medical History:  Diagnosis Date  . Anemia 1990  . Anxiety   . Arthritis 2004  . Asthma 1995  . Colon polyp   . Complication of anesthesia 1966   first surgery she had- had a hard time waking up from surgery  . Diverticulosis 2003  . GERD (gastroesophageal reflux disease)     Patient Active Problem List   Diagnosis Date Noted  . 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 Primary osteoarthritis of left knee 04/04/2016  . 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 Anxiety 11/15/2015  . Gastroesophageal reflux disease without esophagitis  11/15/2015  . Solitary right kidney 07/07/2015  . Urge incontinence of urine 07/07/2015  . Asthma 12/07/2011  . Arthritis 12/07/2011  . 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 Obesity  12/07/2011  . Incisional hernia 03/29/2011  . DIVERTICULOSIS-COLON 12/01/2008  . ABDOMINAL PAIN-RLQ 12/01/2008    Past Surgical History:  Procedure Laterality Date  . APPENDECTOMY  1966  . CATARACT EXTRACTION     Bilateral  . CHOLECYSTECTOMY  2004  . COLON SURGERY  2008  . COLOSTOMY    . COLOSTOMY TAKEDOWN    . DIAGNOSTIC LAPAROSCOPY     exploratory for endometriosis  . HEEL SPUR SURGERY     Right, Metal Plate in Heel  . [place]  2010  . NEPHRECTOMY Left 8676   As complication of partial colectomy for diverticulitis  . TONSILLECTOMY    . 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   Procedure: LEFT TOTAL KNEE ARTHROPLASTY;  Surgeon: Susa Day, MD;  Location: [facility];  Service: Orthopedics;  Laterality: Left;  Adductor Block     OB History   None      Home Medications    Prior to Admission medications   Medication Sig Start Date End Date Taking? Authorizing Provider  albuterol (PROVENTIL HFA;VENTOLIN HFA) 108 (90 Base) MCG/ACT inhaler Inhale 2 puffs into the lungs every 6 (six) hours as needed for wheezing or shortness of breath.  08/15/17   [name], PA-C  amLODipine (NORVASC) 5 MG tablet Take 1 tablet (5 mg total) by mouth daily. 08/15/17   [name], PA-C  cetirizine (ZYRTEC) 10 MG tablet Take 1 tablet (10 mg total) by mouth daily. 08/15/17   [name], PA-C  cyclobenzaprine (FLEXERIL) 5 MG tablet Take 1 tablet (5 mg total) by mouth at bedtime. Patient not taking: Reported on 09/17/2017 11/26/16   [name], PA-C  hydrOXYzine (ATARAX/VISTARIL) 25 MG tablet Take 25 mg by mouth 3 (three) times daily as needed for anxiety.    [provider]  ibuprofen (ADVIL,MOTRIN) 800 MG tablet Take 1,600 mg by mouth daily as needed (pain).    [provider]  paliperidone (INVEGA SUSTENNA) 156 MG/ML SUSY injection Inject 1 application into the muscle every 30 (thirty) days. 09/12/17 09/17/17  [provider]  polyethylene glycol powder (GLYCOLAX/MIRALAX) powder Take 17 g by mouth 2 (two) times daily as needed. Patient not taking: Reported on 09/17/2017 11/01/16   McVey, Gelene Mink, PA-C  predniSONE (DELTASONE) 20 MG tablet Take 1 tablet (20 mg total) by mouth daily with breakfast. Patient not taking: Reported on 09/17/2017 08/15/17   [name], PA-C  ranitidine (ZANTAC) 150 MG tablet Take 1 tablet (150 mg total) by mouth 2 (two) times daily as needed for heartburn. 08/15/17   [name], PA-C  traMADol (ULTRAM) 50 MG tablet Take 50 mg by mouth daily as needed for moderate pain.     [provider]  traZODone (DESYREL) 50 MG tablet Take 50 mg by mouth at bedtime as needed for sleep.    [provider]    Family History Family History  Problem Relation Age of Onset  . Colon polyps Other        niece  . Diabetes Paternal Grandmother   . Diabetes Paternal Aunt   . Heart failure Mother   . Heart attack Father     Social History Social History   Tobacco Use  . Smoking status: Never Smoker  . Smokeless tobacco: Never Used  Substance Use Topics  . Alcohol use: Yes    Comment: occassionally  . Drug use: No     Allergies   Haloperidol and Aripiprazole   Review of Systems Review of Systems  Constitutional: Negative for fever.  Musculoskeletal: Positive for arthralgias.  Skin: Negative for rash and wound.  Neurological: Negative for numbness.     Physical Exam Updated Vital Signs BP (!) 161/83 (BP Location: Left Arm)   Pulse 75   Temp (!) 97.5 F (36.4 C) (Oral)   Resp 17   Ht 5\' 3"  (1.6 m)   Wt 110.2 kg   SpO2 93%   BMI 43.05 kg/m   Physical Exam  Constitutional: She appears well-developed and well-nourished. No distress.  Obese elderly female laying in bed in no acute  discomfort.  HENT:  Head: Atraumatic.  Eyes: Conjunctivae are normal.  Neck: Neck supple.  Abdominal: Soft. She exhibits no distension. There is no tenderness.  Musculoskeletal: She exhibits tenderness (Left leg: Mild tenderness to anterior left knee on palpation with normal knee flexion and extension, normal abduction abduction and flexion of the left hip.  Patella is located.  DP pulse palpable.).  No midline spine tenderness crepitus step-off.  Neurological: She is alert.  Skin: No rash noted.  Psychiatric: She has a normal mood and affect.  Nursing note and vitals reviewed.    ED Treatments / Results  Labs (all labs ordered are  listed, but only abnormal results are displayed) Labs Reviewed - No data to display  EKG None  Radiology No results found.  Procedures Procedures (including critical care time)  Medications Ordered in ED Medications  traMADol (ULTRAM) tablet 50 mg (50 mg Oral Given 12/13/17 1407)     Initial Impression / Assessment and Plan / ED Course  I have reviewed the triage vital signs and the nursing notes.  Pertinent labs & imaging results that were available during my care of the patient were reviewed by me and considered in my medical decision making (see chart for details).     BP (!) 161/83 (BP Location: Left Arm)   Pulse 75   Temp (!) 97.5 F (36.4 C) (Oral)   Resp 17   Ht 5\' 3"  (1.6 m)   Wt 110.2 kg   SpO2 93%   BMI 43.05 kg/m    Final Clinical Impressions(s) / ED Diagnoses   Final diagnoses:  Hip strain, left, initial encounter    ED Discharge Orders         Ordered    cyclobenzaprine (FLEXERIL) 5 MG tablet  3 times daily PRN     12/13/17 1430    ibuprofen (ADVIL,MOTRIN) 800 MG tablet  Every 8 hours PRN     12/13/17 1430         1:20 PM Patient presents with acute onset of radicular left leg pain.  On exam she has mild tenderness to left anterior knee but otherwise no significant discomfort to the palpation of her left  hip.  5 out of 5 strength to bilateral lower extremities with intact distal pedal pulse and hip is not dislocated on exam.  I suspect this is a musculoskeletal strain.  Have low suspicion for aortic dissection, cauda equina symptoms or any other acute emergent condition.  Will have patient ambulated.  Pain is minimal at this time. Care discussed with Dr. Roderic Palau  2:33 PM Patient able to ambulate without assistance, pain only manifest when bending.  Suspect this is likely a muscle strain.  Patient discharged home with symptom medic treatment.  She is stable for discharge.  Return precautions discussed.   Domenic Moras, PA-C 12/13/17 1434    Milton Ferguson, MD 12/13/17 [phone]

## 2017-12-13 NOTE — ED Notes (Signed)
Bed: DU20 Expected date:  Expected time:  Means of arrival:  Comments: EMS bent over upper hip/leg pain

## 2017-12-13 NOTE — Discharge Instructions (Signed)
Your hip pain is likely due to muscle strain.  Please take ibuprofen and flexeril as needed for pain.  Return if you have any concerns.

## 2017-12-13 NOTE — ED Notes (Signed)
Pt ambulated w/o assist.  Pt did not show signs of distress.  Pt maintained steady gait.  Pt stated that walking does not bother her but bending pain at left upper hip area.

## 2017-12-13 NOTE — ED Triage Notes (Signed)
Patient arrives from EMS with c/o left upper leg and hip pain after grabbing something in the closet. Patient states she felt a sharp pain. Denies any injury/trauma/surgeries to area.   BP:: 150/80 HR: 80 RR: 16

## 2017-12-13 NOTE — ED Notes (Signed)
Patient upset about wait, called [facility] for ETA. Informed patient up to 1.5 hours. Patient attempting to call family for ride.

## 2017-12-13 NOTE — ED Notes (Signed)
Bed: [facility] Expected date:  Expected time:  Means of arrival:  Comments: 

## 2019-06-18 ENCOUNTER — Encounter (HOSPITAL_COMMUNITY): Payer: Self-pay

## 2019-06-18 ENCOUNTER — Emergency Department (HOSPITAL_COMMUNITY): Payer: Medicare Other

## 2019-06-18 ENCOUNTER — Emergency Department (HOSPITAL_COMMUNITY)
Admission: EM | Admit: 2019-06-18 | Discharge: 2019-06-19 | Disposition: A | Discharge status: Home / Self Care | Payer: Medicare Other | Attending: Emergency Medicine | Admitting: Emergency Medicine

## 2019-06-18 ENCOUNTER — Other Ambulatory Visit: Payer: Self-pay

## 2019-06-18 DIAGNOSIS — K219 Gastro-esophageal reflux disease without esophagitis: Secondary | ICD-10-CM | POA: Insufficient documentation

## 2019-06-18 DIAGNOSIS — J45909 Unspecified asthma, uncomplicated: Secondary | ICD-10-CM | POA: Diagnosis not present

## 2019-06-18 DIAGNOSIS — R079 Chest pain, unspecified: Secondary | ICD-10-CM | POA: Diagnosis present

## 2019-06-18 DIAGNOSIS — Z79899 Other long term (current) drug therapy: Secondary | ICD-10-CM | POA: Diagnosis not present

## 2019-06-18 LAB — CBC
HCT: 46.3 % — ABNORMAL HIGH (ref 36.0–46.0)
Hemoglobin: 14.7 g/dL (ref 12.0–15.0)
MCH: 30.1 pg (ref 26.0–34.0)
MCHC: 31.7 g/dL (ref 30.0–36.0)
MCV: 94.9 fL (ref 80.0–100.0)
Platelets: 210 10*3/uL (ref 150–400)
RBC: 4.88 MIL/uL (ref 3.87–5.11)
RDW: 13.2 % (ref 11.5–15.5)
WBC: 10.5 10*3/uL (ref 4.0–10.5)
nRBC: 0 % (ref 0.0–0.2)

## 2019-06-18 LAB — BASIC METABOLIC PANEL
Anion gap: 10 (ref 5–15)
BUN: 15 mg/dL (ref 8–23)
CO2: 27 mmol/L (ref 22–32)
Calcium: 9 mg/dL (ref 8.9–10.3)
Chloride: 104 mmol/L (ref 98–111)
Creatinine, Ser: 1.18 mg/dL — ABNORMAL HIGH (ref 0.44–1.00)
GFR calc Af Amer: 52 mL/min — ABNORMAL LOW (ref >60)
GFR calc non Af Amer: 44 mL/min — ABNORMAL LOW (ref >60)
Glucose, Bld: 98 mg/dL (ref 70–99)
Potassium: 3.8 mmol/L (ref 3.5–5.1)
Sodium: 141 mmol/L (ref 135–145)

## 2019-06-18 LAB — TROPONIN I (HIGH SENSITIVITY): Troponin I (High Sensitivity): 6 ng/L (ref <18)

## 2019-06-18 MED ORDER — SODIUM CHLORIDE 0.9% FLUSH
3.0000 mL | Freq: Once | INTRAVENOUS | Status: AC
  Administered 2019-06-19: 3 mL via INTRAVENOUS

## 2019-06-18 NOTE — ED Triage Notes (Signed)
Pt bib [facility] from [facility] w/ c/o of left sided non-radiating chest pain. Initially pain 5/10, pt received 324mg  aspirin and 1 SL nitro w/ EMS and chest pain has resolved. EMS VSS.

## 2019-06-19 ENCOUNTER — Encounter (HOSPITAL_COMMUNITY): Payer: Self-pay | Admitting: Emergency Medicine

## 2019-06-19 DIAGNOSIS — K219 Gastro-esophageal reflux disease without esophagitis: Secondary | ICD-10-CM | POA: Diagnosis not present

## 2019-06-19 LAB — TROPONIN I (HIGH SENSITIVITY): Troponin I (High Sensitivity): 5 ng/L (ref <18)

## 2019-06-19 MED ORDER — ALUM & MAG HYDROXIDE-SIMETH 200-200-20 MG/5ML PO SUSP
30.0000 mL | Freq: Once | ORAL | Status: DC
  Filled 2019-06-19: qty 30

## 2019-06-19 MED ORDER — LIDOCAINE VISCOUS HCL 2 % MT SOLN
15.0000 mL | Freq: Once | OROMUCOSAL | Status: DC
  Filled 2019-06-19: qty 15

## 2019-06-19 MED ORDER — OMEPRAZOLE 20 MG PO CPDR: 20.0000 mg | DELAYED_RELEASE_CAPSULE | Freq: Every day | ORAL | 0 refills | Status: AC

## 2019-06-19 NOTE — ED Notes (Signed)
Patient verbalizes understanding of discharge instructions. Opportunity for questioning and answers were provided. Armband removed by staff, pt discharged from ED. Left via [facility]  

## 2019-06-19 NOTE — ED Notes (Signed)
[facility] called to transport pt 

## 2019-06-19 NOTE — ED Provider Notes (Signed)
[facility] EMERGENCY DEPARTMENT Provider Note   CSN: [id number] Arrival date & time: 06/18/19  2139     History Chief Complaint  Patient presents with  . Chest Pain    [name] is a 79 y.o. female.  The history is provided by the patient.  Chest Pain Pain location:  L chest (lowest Left chest ) Pain quality: burning   Pain quality comment:  "prickly" Pain radiates to:  Does not radiate Pain severity:  Moderate Onset quality:  Gradual Timing:  Constant Progression:  Partially resolved Chronicity:  New Context: at rest   Context: not breathing   Relieved by:  Nothing Worsened by:  Nothing Ineffective treatments:  Nitroglycerin Associated symptoms: no anxiety, no back pain, no claudication, no cough, no diaphoresis, no dizziness, no dysphagia, no fatigue, no fever, no headache, no lower extremity edema and no shortness of breath   Associated symptoms comment:  Needing to burp Risk factors: not female        Past Medical History:  Diagnosis Date  . Anemia 1990  . Anxiety   . Arthritis 2004  . Asthma 1995  . Colon polyp   . Complication of anesthesia 1966   first surgery she had- had a hard time waking up from surgery  . Diverticulosis 2003  . GERD (gastroesophageal reflux disease)     Patient Active Problem List   Diagnosis Date Noted  . 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 Primary osteoarthritis of left knee 04/04/2016  . 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 Anxiety 11/15/2015  . Gastroesophageal reflux disease without esophagitis 11/15/2015  . Solitary right kidney 07/07/2015  . Urge incontinence of urine 07/07/2015  . Asthma 12/07/2011  . Arthritis 12/07/2011  . 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 Obesity  12/07/2011  . Incisional hernia 03/29/2011  . DIVERTICULOSIS-COLON 12/01/2008  . ABDOMINAL PAIN-RLQ 12/01/2008    Past Surgical History:  Procedure Laterality Date  . APPENDECTOMY   1966  . CATARACT EXTRACTION     Bilateral  . CHOLECYSTECTOMY  2004  . COLON SURGERY  2008  . COLOSTOMY    . COLOSTOMY TAKEDOWN    . DIAGNOSTIC LAPAROSCOPY     exploratory for endometriosis  . HEEL SPUR SURGERY     Right, Metal Plate in Heel  . [place]  2010  . NEPHRECTOMY Left [id number]   As complication of partial colectomy for diverticulitis  . TONSILLECTOMY    . 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   Procedure: LEFT TOTAL KNEE ARTHROPLASTY;  Surgeon: Susa Day, MD;  Location: [facility];  Service: Orthopedics;  Laterality: Left;  Adductor Block     OB History   No obstetric history on file.     Family History  Problem Relation Age of Onset  . Colon polyps Other        niece  . Diabetes Paternal Grandmother   . Diabetes Paternal Aunt   . Heart failure Mother   . Heart attack Father     Social History   Tobacco Use  . Smoking status: Never Smoker  . Smokeless tobacco: Never Used  Substance Use Topics  . Alcohol use: Yes    Comment: occassionally  . Drug use: No    Home Medications Prior to Admission medications   Medication Sig Start Date End Date Taking? Authorizing Provider  albuterol (PROVENTIL HFA;VENTOLIN HFA) 108 (90 Base) MCG/ACT inhaler Inhale 2 puffs into the lungs every 6 (six) hours  as needed for wheezing or shortness of breath. 08/15/17   [name], PA-C  amLODipine (NORVASC) 5 MG tablet Take 1 tablet (5 mg total) by mouth daily. 08/15/17   [name], PA-C  cetirizine (ZYRTEC) 10 MG tablet Take 1 tablet (10 mg total) by mouth daily. 08/15/17   [name], PA-C  cyclobenzaprine (FLEXERIL) 5 MG tablet Take 1 tablet (5 mg total) by mouth 3 (three) times daily as needed for muscle spasms. 12/13/17   Domenic Moras, PA-C  hydrOXYzine (ATARAX/VISTARIL) 25 MG tablet Take 25 mg by mouth 3 (three) times daily as needed for anxiety.    [provider]  ibuprofen (ADVIL,MOTRIN) 800 MG tablet Take 1 tablet (800 mg total) by mouth every 8 (eight)  hours as needed (pain). 12/13/17   Domenic Moras, PA-C  paliperidone (INVEGA SUSTENNA) 156 MG/ML SUSY injection Inject 1 application into the muscle every 30 (thirty) days. 09/12/17 09/17/17  [provider]  polyethylene glycol powder (GLYCOLAX/MIRALAX) powder Take 17 g by mouth 2 (two) times daily as needed. Patient not taking: Reported on 09/17/2017 11/01/16   McVey, Gelene Mink, PA-C  predniSONE (DELTASONE) 20 MG tablet Take 1 tablet (20 mg total) by mouth daily with breakfast. Patient not taking: Reported on 09/17/2017 08/15/17   [name], PA-C  ranitidine (ZANTAC) 150 MG tablet Take 1 tablet (150 mg total) by mouth 2 (two) times daily as needed for heartburn. 08/15/17   [name], PA-C  traMADol (ULTRAM) 50 MG tablet Take 50 mg by mouth daily as needed for moderate pain.     [provider]  traZODone (DESYREL) 50 MG tablet Take 50 mg by mouth at bedtime as needed for sleep.    [provider]    Allergies    Haloperidol and Aripiprazole  Review of Systems   Review of Systems  Constitutional: Negative for diaphoresis, fatigue and fever.  HENT: Negative for trouble swallowing.   Eyes: Negative for visual disturbance.  Respiratory: Negative for cough and shortness of breath.   Cardiovascular: Positive for chest pain. Negative for claudication.  Genitourinary: Negative for difficulty urinating.  Musculoskeletal: Negative for back pain.  Skin: Negative for wound.  Neurological: Negative for dizziness and headaches.  Psychiatric/Behavioral: Negative for agitation.  All other systems reviewed and are negative.   Physical Exam Updated Vital Signs BP 123/76   Pulse 74   Temp 98.1 F (36.7 C) (Oral)   Resp 16   SpO2 91%   Physical Exam Vitals and nursing note reviewed.  Constitutional:      General: She is not in acute distress.    Appearance: Normal appearance.  HENT:     Head: Normocephalic and atraumatic.     Nose: Nose normal.  Eyes:      Conjunctiva/sclera: Conjunctivae normal.     Pupils: Pupils are equal, round, and reactive to light.  Cardiovascular:     Rate and Rhythm: Normal rate and regular rhythm.     Pulses: Normal pulses.     Heart sounds: Normal heart sounds.  Pulmonary:     Effort: Pulmonary effort is normal.     Breath sounds: Normal breath sounds.  Abdominal:     General: Abdomen is flat. Bowel sounds are increased.     Tenderness: There is no abdominal tenderness. There is no guarding. Negative signs include Murphy's sign and McBurney's sign.  Musculoskeletal:        General: Normal range of motion.     Cervical back: Normal range of motion  and neck supple.  Skin:    General: Skin is warm and dry.     Capillary Refill: Capillary refill takes less than 2 seconds.  Neurological:     General: No focal deficit present.     Mental Status: She is alert and oriented to person, place, and time.     Deep Tendon Reflexes: Reflexes normal.  Psychiatric:        Mood and Affect: Mood normal.        Behavior: Behavior normal.     ED Results / Procedures / Treatments   Labs (all labs ordered are listed, but only abnormal results are displayed) Results for orders placed or performed during the hospital encounter of [id number]  Basic metabolic panel  Result Value Ref Range   Sodium 141 135 - 145 mmol/L   Potassium 3.8 3.5 - 5.1 mmol/L   Chloride 104 98 - 111 mmol/L   CO2 27 22 - 32 mmol/L   Glucose, Bld 98 70 - 99 mg/dL   BUN 15 8 - 23 mg/dL   Creatinine, Ser 1.18 (H) 0.44 - 1.00 mg/dL   Calcium 9.0 8.9 - 10.3 mg/dL   GFR calc non Af Amer 44 (L) >60 mL/min   GFR calc Af Amer 52 (L) >60 mL/min   Anion gap 10 5 - 15  CBC  Result Value Ref Range   WBC 10.5 4.0 - 10.5 K/uL   RBC 4.88 3.87 - 5.11 MIL/uL   Hemoglobin 14.7 12.0 - 15.0 g/dL   HCT 46.3 (H) 36.0 - 46.0 %   MCV 94.9 80.0 - 100.0 fL   MCH 30.1 26.0 - 34.0 pg   MCHC 31.7 30.0 - 36.0 g/dL   RDW 13.2 11.5 - 15.5 %   Platelets 210 150 - 400 K/uL    nRBC 0.0 0.0 - 0.2 %  Troponin I (High Sensitivity)  Result Value Ref Range   Troponin I (High Sensitivity) 6 <18 ng/L   DG Chest 2 View  Result Date: 06/18/2019 CLINICAL DATA:  Chest pain EXAM: CHEST - 2 VIEW COMPARISON:  09/18/2017 FINDINGS: Streaky scarring at the lingula and left base without change. No consolidation or effusion. Enlarged cardiomediastinal silhouette without change. No pneumothorax IMPRESSION: No active cardiopulmonary disease. Stable scarring at the lingula and left base. Electronically Signed   By: Donavan Foil M.D.   On: 06/18/2019 22:19    EKG EKG Interpretation  Date/Time:  Thursday June 18 2019 21:38:19 EST Ventricular Rate:  79 PR Interval:  176 QRS Duration: 76 QT Interval:  372 QTC Calculation: 426 R Axis:   -117 Text Interpretation: Sinus rhythm with Fusion complexes Right superior axis deviation Low voltage QRS Cannot rule out Anterior infarct , age undetermined Abnormal ECG When compared with ECG of 09/18/2017, No significant change was found Confirmed by Delora Fuel ([id number]) on 06/18/2019 11:06:00 PM   Radiology DG Chest 2 View  Result Date: 06/18/2019 CLINICAL DATA:  Chest pain EXAM: CHEST - 2 VIEW COMPARISON:  09/18/2017 FINDINGS: Streaky scarring at the lingula and left base without change. No consolidation or effusion. Enlarged cardiomediastinal silhouette without change. No pneumothorax IMPRESSION: No active cardiopulmonary disease. Stable scarring at the lingula and left base. Electronically Signed   By: Donavan Foil M.D.   On: 06/18/2019 22:19    Procedures Procedures (including critical care time)  Medications Ordered in ED Medications  sodium chloride flush (NS) 0.9 % injection 3 mL (has no administration in time range)  alum & mag hydroxide-simeth (MAALOX/MYLANTA)  200-200-20 MG/5ML suspension 30 mL (has no administration in time range)    And  lidocaine (XYLOCAINE) 2 % viscous mouth solution 15 mL (has no administration in time range)      ED Course  I have reviewed the triage vital signs and the nursing notes.  Pertinent labs & imaging results that were available during my care of the patient were reviewed by me and considered in my medical decision making (see chart for details).    Ruled out for MI in the ED. HEART score is 2 low risk for MACE. Symptoms consistent with GERD, will start PPI and gerd friendly diet.    [name] was evaluated in Emergency Department on 06/19/2019 for the symptoms described in the history of present illness. She was evaluated in the context of the global COVID-19 pandemic, which necessitated consideration that the patient might be at risk for infection with the SARS-CoV-2 virus that causes COVID-19. Institutional protocols and algorithms that pertain to the evaluation of patients at risk for COVID-19 are in a state of rapid change based on information released by regulatory bodies including the CDC and federal and state organizations. These policies and algorithms were followed during the patient's care in the ED.  Final Clinical Impression(s) / ED Diagnoses Return for weakness, numbness, changes in vision or speech, fevers >100.4 unrelieved by medication, shortness of breath, intractable vomiting, or diarrhea, abdominal pain, Inability to tolerate liquids or food, cough, altered mental status or any concerns. No signs of systemic illness or infection. The patient is nontoxic-appearing on exam and vital signs are within normal limits.   I have reviewed the triage vital signs and the nursing notes. Pertinent labs &imaging results that were available during my care of the patient were reviewed by me and considered in my medical decision making (see chart for details).  After history, exam, and medical workup I feel the patient has been appropriately medically screened and is safe for discharge home. Pertinent diagnoses were discussed with the patient. Patient was given return  precautions    Alyzabeth Pontillo, MD 06/19/19 [id number]

## 2019-06-19 NOTE — ED Notes (Signed)
Pt ambulated well to the BR with minimal assistance.

## 2019-06-19 NOTE — ED Provider Notes (Signed)
Niece Nira Conn guardian notified of DC.     Tawan Corkern, MD 06/19/19 [id number]

## 2019-06-19 NOTE — ED Notes (Signed)
Pt ambulated back to the examination room well with no assistance.

## 2020-03-22 ENCOUNTER — Encounter (INDEPENDENT_AMBULATORY_CARE_PROVIDER_SITE_OTHER): Payer: PRIVATE HEALTH INSURANCE | Admitting: Ophthalmology

## 2020-04-07 ENCOUNTER — Encounter: Payer: Self-pay | Admitting: Internal Medicine

## 2020-04-18 ENCOUNTER — Encounter (INDEPENDENT_AMBULATORY_CARE_PROVIDER_SITE_OTHER): Payer: Medicare Other | Admitting: Ophthalmology

## 2020-04-18 ENCOUNTER — Other Ambulatory Visit: Payer: Self-pay

## 2020-04-18 DIAGNOSIS — H2703 Aphakia, bilateral: Secondary | ICD-10-CM

## 2020-04-18 DIAGNOSIS — H43813 Vitreous degeneration, bilateral: Secondary | ICD-10-CM | POA: Diagnosis not present

## 2020-04-18 DIAGNOSIS — H4423 Degenerative myopia, bilateral: Secondary | ICD-10-CM | POA: Diagnosis not present

## 2020-05-08 ENCOUNTER — Emergency Department (HOSPITAL_COMMUNITY): Payer: Medicare Other

## 2020-05-08 ENCOUNTER — Emergency Department (HOSPITAL_COMMUNITY)
Admission: EM | Admit: 2020-05-08 | Discharge: 2020-05-08 | Disposition: A | Discharge status: Home / Self Care | Payer: Medicare Other | Attending: Emergency Medicine | Admitting: Emergency Medicine

## 2020-05-08 ENCOUNTER — Encounter (HOSPITAL_COMMUNITY): Payer: Self-pay | Admitting: Emergency Medicine

## 2020-05-08 ENCOUNTER — Other Ambulatory Visit: Payer: Self-pay

## 2020-05-08 DIAGNOSIS — J45909 Unspecified asthma, uncomplicated: Secondary | ICD-10-CM | POA: Diagnosis not present

## 2020-05-08 DIAGNOSIS — Z96652 Presence of left artificial knee joint: Secondary | ICD-10-CM | POA: Insufficient documentation

## 2020-05-08 DIAGNOSIS — R1032 Left lower quadrant pain: Secondary | ICD-10-CM | POA: Diagnosis not present

## 2020-05-08 DIAGNOSIS — K219 Gastro-esophageal reflux disease without esophagitis: Secondary | ICD-10-CM | POA: Insufficient documentation

## 2020-05-08 DIAGNOSIS — K5900 Constipation, unspecified: Secondary | ICD-10-CM | POA: Diagnosis not present

## 2020-05-08 LAB — CBC
HCT: 45.6 % (ref 36.0–46.0)
Hemoglobin: 15.2 g/dL — ABNORMAL HIGH (ref 12.0–15.0)
MCH: 31 pg (ref 26.0–34.0)
MCHC: 33.3 g/dL (ref 30.0–36.0)
MCV: 93.1 fL (ref 80.0–100.0)
Platelets: 256 10*3/uL (ref 150–400)
RBC: 4.9 MIL/uL (ref 3.87–5.11)
RDW: 12.9 % (ref 11.5–15.5)
WBC: 6.3 10*3/uL (ref 4.0–10.5)
nRBC: 0 % (ref 0.0–0.2)

## 2020-05-08 LAB — COMPREHENSIVE METABOLIC PANEL
ALT: 12 U/L (ref 0–44)
AST: 16 U/L (ref 15–41)
Albumin: 3.6 g/dL (ref 3.5–5.0)
Alkaline Phosphatase: 61 U/L (ref 38–126)
Anion gap: 11 (ref 5–15)
BUN: 17 mg/dL (ref 8–23)
CO2: 25 mmol/L (ref 22–32)
Calcium: 8.9 mg/dL (ref 8.9–10.3)
Chloride: 101 mmol/L (ref 98–111)
Creatinine, Ser: 1.2 mg/dL — ABNORMAL HIGH (ref 0.44–1.00)
GFR, Estimated: 46 mL/min — ABNORMAL LOW (ref >60)
Glucose, Bld: 101 mg/dL — ABNORMAL HIGH (ref 70–99)
Potassium: 4 mmol/L (ref 3.5–5.1)
Sodium: 137 mmol/L (ref 135–145)
Total Bilirubin: 0.8 mg/dL (ref 0.3–1.2)
Total Protein: 6 g/dL — ABNORMAL LOW (ref 6.5–8.1)

## 2020-05-08 LAB — LIPASE, BLOOD: Lipase: 32 U/L (ref 11–51)

## 2020-05-08 MED ORDER — MAGNESIUM CITRATE PO SOLN: 1.0000 | Freq: Every day | ORAL | 5 refills | Status: AC

## 2020-05-08 MED ORDER — SENNOSIDES-DOCUSATE SODIUM 8.6-50 MG PO TABS: 2.0000 | ORAL_TABLET | Freq: Every day | ORAL | 0 refills | Status: DC

## 2020-05-08 MED ORDER — SENNOSIDES-DOCUSATE SODIUM 8.6-50 MG PO TABS: 2.0000 | ORAL_TABLET | Freq: Every day | ORAL | 0 refills | Status: AC

## 2020-05-08 MED ORDER — MINERAL OIL RE ENEM: 1.0000 | ENEMA | Freq: Every day | RECTAL | 3 refills | Status: DC | PRN

## 2020-05-08 MED ORDER — SENNOSIDES-DOCUSATE SODIUM 8.6-50 MG PO TABS: 1.0000 | ORAL_TABLET | Freq: Once | ORAL | Status: DC

## 2020-05-08 NOTE — ED Notes (Signed)
Noralee Space (niece) made aware pt is very discharged home.

## 2020-05-08 NOTE — ED Provider Notes (Signed)
[facility] EMERGENCY DEPARTMENT Provider Note   CSN: [id number] Arrival date & time: 05/08/20  1317     History Chief Complaint  Patient presents with  . Abdominal Pain    [name] is a 80 y.o. female.  HPI Elderly female presents with left lower quadrant abdominal pain.  She notes the pain has been there for a long time, but now over the past 2 days or so she has had more severe discomfort. Pain is focal in the left lower quadrant, nonradiating. There is associated decreased bowel movements, with no nausea, no vomiting, no fever. Pain is not improved substantially with ibuprofen, is 10/10 with motion, 3/10 at rest. Patient has a notable history of prior hernia repair in that area, and she states that she thinks the prior mesh repair is causing her pain. Patient denies of any changes from baseline.    Past Medical History:  Diagnosis Date  . Anemia 1990  . Anxiety   . Arthritis 2004  . Asthma 1995  . Colon polyp   . Complication of anesthesia 1966   first surgery she had- had a hard time waking up from surgery  . Diverticulosis 2003  . GERD (gastroesophageal reflux disease)     Patient Active Problem List   Diagnosis Date Noted  . 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 Primary osteoarthritis of left knee 04/04/2016  . 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 Anxiety 11/15/2015  . Gastroesophageal reflux disease without esophagitis 11/15/2015  . Solitary right kidney 07/07/2015  . Urge incontinence of urine 07/07/2015  . Asthma 12/07/2011  . Arthritis 12/07/2011  . 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 Obesity  12/07/2011  . Incisional hernia 03/29/2011  . DIVERTICULOSIS-COLON 12/01/2008  . ABDOMINAL PAIN-RLQ 12/01/2008    Past Surgical History:  Procedure Laterality Date  . APPENDECTOMY  1966  . CATARACT EXTRACTION     Bilateral  . CHOLECYSTECTOMY  2004  . COLON SURGERY  2008  . COLOSTOMY    .  COLOSTOMY TAKEDOWN    . DIAGNOSTIC LAPAROSCOPY     exploratory for endometriosis  . HEEL SPUR SURGERY     Right, Metal Plate in Heel  . [place]  2010  . NEPHRECTOMY Left [id number]   As complication of partial colectomy for diverticulitis  . TONSILLECTOMY    . 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   Procedure: LEFT TOTAL KNEE ARTHROPLASTY;  Surgeon: Susa Day, MD;  Location: [facility];  Service: Orthopedics;  Laterality: Left;  Adductor Block     OB History   No obstetric history on file.     Family History  Problem Relation Age of Onset  . Colon polyps Other        niece  . Diabetes Paternal Grandmother   . Diabetes Paternal Aunt   . Heart failure Mother   . Heart attack Father     Social History   Tobacco Use  . Smoking status: Never Smoker  . Smokeless tobacco: Never Used  Vaping Use  . Vaping Use: Never used  Substance Use Topics  . Alcohol use: Yes    Comment: occassionally  . Drug use: No    Home Medications Prior to Admission medications   Medication Sig Start Date End Date Taking? Authorizing Provider  albuterol (PROVENTIL HFA;VENTOLIN HFA) 108 (90 Base) MCG/ACT inhaler Inhale 2 puffs into the lungs every 6 (six) hours as needed for wheezing or shortness of breath. 08/15/17  [name], PA-C  amLODipine (NORVASC) 5 MG tablet Take 1 tablet (5 mg total) by mouth daily. 08/15/17   [name], PA-C  cetirizine (ZYRTEC) 10 MG tablet Take 1 tablet (10 mg total) by mouth daily. 08/15/17   [name], PA-C  cyclobenzaprine (FLEXERIL) 5 MG tablet Take 1 tablet (5 mg total) by mouth 3 (three) times daily as needed for muscle spasms. 12/13/17   Domenic Moras, PA-C  hydrOXYzine (ATARAX/VISTARIL) 25 MG tablet Take 25 mg by mouth 3 (three) times daily as needed for anxiety.    [provider]  ibuprofen (ADVIL,MOTRIN) 800 MG tablet Take 1 tablet (800 mg total) by mouth every 8 (eight) hours as needed (pain). 12/13/17   Domenic Moras, PA-C  omeprazole  (PRILOSEC) 20 MG capsule Take 1 capsule (20 mg total) by mouth daily. 06/19/19   Palumbo, April, MD  paliperidone (INVEGA SUSTENNA) 156 MG/ML SUSY injection Inject 1 application into the muscle every 30 (thirty) days. 09/12/17 09/17/17  [provider]  polyethylene glycol powder (GLYCOLAX/MIRALAX) powder Take 17 g by mouth 2 (two) times daily as needed. Patient not taking: Reported on 09/17/2017 11/01/16   McVey, Gelene Mink, PA-C  predniSONE (DELTASONE) 20 MG tablet Take 1 tablet (20 mg total) by mouth daily with breakfast. Patient not taking: Reported on 09/17/2017 08/15/17   [name], PA-C  ranitidine (ZANTAC) 150 MG tablet Take 1 tablet (150 mg total) by mouth 2 (two) times daily as needed for heartburn. 08/15/17   [name], PA-C  traMADol (ULTRAM) 50 MG tablet Take 50 mg by mouth daily as needed for moderate pain.     [provider]  traZODone (DESYREL) 50 MG tablet Take 50 mg by mouth at bedtime as needed for sleep.    [provider]    Allergies    Haloperidol and Aripiprazole  Review of Systems   Review of Systems  Constitutional:       Per HPI, otherwise negative  HENT:       Per HPI, otherwise negative  Respiratory:       Per HPI, otherwise negative  Cardiovascular:       Per HPI, otherwise negative  Gastrointestinal: Positive for abdominal pain. Negative for vomiting.  Endocrine:       Negative aside from HPI  Genitourinary:       Neg aside from HPI   Musculoskeletal:       Per HPI, otherwise negative  Skin: Negative.   Neurological: Negative for syncope.    Physical Exam Updated Vital Signs BP (!) 151/80   Pulse 79   Temp 98.4 F (36.9 C) (Oral)   Resp 18   SpO2 95%   Physical Exam Vitals and nursing note reviewed.  Constitutional:      General: She is not in acute distress.    Appearance: She is well-developed and well-nourished. She is obese.  HENT:     Head: Normocephalic and atraumatic.  Eyes:     Extraocular  Movements: EOM normal.     Conjunctiva/sclera: Conjunctivae normal.  Cardiovascular:     Rate and Rhythm: Normal rate and regular rhythm.  Pulmonary:     Effort: Pulmonary effort is normal. No respiratory distress.     Breath sounds: Normal breath sounds. No stridor.  Abdominal:     General: There is no distension.     Tenderness: There is abdominal tenderness in the left lower quadrant.  Musculoskeletal:        General: No edema.  Skin:  General: Skin is warm and dry.  Neurological:     Mental Status: She is alert and oriented to person, place, and time.     Cranial Nerves: No cranial nerve deficit.  Psychiatric:        Mood and Affect: Mood and affect normal.     ED Results / Procedures / Treatments   Labs (all labs ordered are listed, but only abnormal results are displayed) Labs Reviewed  COMPREHENSIVE METABOLIC PANEL - Abnormal; Notable for the following components:      Result Value   Glucose, Bld 101 (*)    Creatinine, Ser 1.20 (*)    Total Protein 6.0 (*)    GFR, Estimated 46 (*)    All other components within normal limits  CBC - Abnormal; Notable for the following components:   Hemoglobin 15.2 (*)    All other components within normal limits  LIPASE, BLOOD  URINALYSIS, ROUTINE W REFLEX MICROSCOPIC    EKG None  Radiology CT Abdomen Pelvis Wo Contrast  Result Date: 05/08/2020 CLINICAL DATA:  Left lower quadrant pain concerning for diverticulitis. EXAM: CT ABDOMEN AND PELVIS WITHOUT CONTRAST TECHNIQUE: Multidetector CT imaging of the abdomen and pelvis was performed following the standard protocol without IV contrast. COMPARISON:  July 29, 2017. FINDINGS: Lower chest: The lung bases are clear. The heart size is normal. 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 Status post cholecystectomy.There is no biliary ductal dilation. Pancreas: Normal contours without ductal dilatation. No peripancreatic fluid collection. Spleen:  Unremarkable. Adrenals/Urinary Tract: --Adrenal glands: Unremarkable. --Right kidney/ureter: No hydronephrosis or radiopaque kidney stones. --Left kidney/ureter: The left kidney is absent. --Urinary bladder: Unremarkable. Stomach/Bowel: --Stomach/Duodenum: No hiatal hernia or other gastric abnormality. Normal duodenal course and caliber. --Small bowel: Unremarkable. --Colon: There is a large amount of stool throughout the colon. There is scattered colonic diverticula without CT evidence for diverticulitis. --Appendix: Normal. Vascular/Lymphatic: Atherosclerotic calcification is present within the non-aneurysmal abdominal aorta, without hemodynamically significant stenosis. --No retroperitoneal lymphadenopathy. --No mesenteric lymphadenopathy. --No pelvic or inguinal lymphadenopathy. Reproductive: Unremarkable Other: No ascites or free air. The abdominal wall is normal. Musculoskeletal. No acute displaced fractures. IMPRESSION: 1. No acute abdominopelvic abnormality. 2. Large amount of stool throughout the colon. 3. Colonic diverticulosis without CT evidence for diverticulitis. Aortic Atherosclerosis (ICD10-I70.0). Electronically Signed   By: Constance Holster M.D.   On: 05/08/2020 19:48     Medications Ordered in ED Medications - No data to display  ED Course  I have reviewed the triage vital signs and the nursing notes.  Pertinent labs & imaging results that were available during my care of the patient were reviewed by me and considered in my medical decision making (see chart for details).  8:00 PM On repeat exam the patient is awake, alert, speaking similarly, clearly, softly. We discussed findings, and I reviewed the CT scan. No evidence for obstruction, no evidence for diverticulitis, no evidence for abscess.  There is evidence for constipation, consistent with the patient's note that over the past 2 days she has had diminished bowel movements. With a nonperitoneal abdomen, otherwise reassuring  labs, low suspicion for other acute new pathology, no evidence of bacteremia, sepsis. Patient amenable to, appropriate for follow-up after initiation of a bowel movement regimen.  Patient has a GI scheduled follow-up within 2 weeks including colonoscopy.  Final Clinical Impression(s) / ED Diagnoses Final diagnoses:  Left lower quadrant abdominal pain  Constipation, unspecified constipation type    Rx / DC Orders ED  Discharge Orders         Ordered    senna-docusate (SENOKOT-S) 8.6-50 MG tablet  Daily        05/08/20 2004    magnesium citrate SOLN  Daily        05/08/20 2004    mineral oil enema  Daily PRN        05/08/20 2004           Carmin Muskrat, MD 05/08/20 2010

## 2020-05-08 NOTE — ED Notes (Signed)
PTAR at bedside to transfer pt home.

## 2020-05-08 NOTE — Discharge Instructions (Signed)
As discussed, today's evaluation has been generally reassuring.  However, the CT scan did demonstrate substantial constipation.  Please use the provided bowel movement regimen for the coming days and be sure to follow-up with your gastroenterologist as scheduled.  Return here for concerning changes in your condition.

## 2020-05-08 NOTE — ED Triage Notes (Signed)
Pt to triage via [place] from [facility].  Reports LLQ pain x 2 days.  Took Ibuprofen yesterday with relief.  Pt refused Ibuprofen today and wanted to come to ED.  She believes the pain is from where she had hernia surgery 10 years ago.

## 2020-05-24 ENCOUNTER — Encounter: Payer: Medicare Other | Admitting: Internal Medicine

## 2020-06-16 ENCOUNTER — Telehealth: Payer: Self-pay

## 2020-06-16 NOTE — Telephone Encounter (Signed)
Okay to proceed.  Thank you 

## 2020-06-16 NOTE — Telephone Encounter (Signed)
Last colonoscopy performed in 2013 with a 3 year recall for 2016 for adenomatous polyp.  No colon in 2016 and pt is 80 y/o.  Is it ok to proceed as scheduled or does patient need office visit?

## 2020-06-29 ENCOUNTER — Ambulatory Visit (AMBULATORY_SURGERY_CENTER): Payer: Medicare Other

## 2020-06-29 ENCOUNTER — Other Ambulatory Visit: Payer: Self-pay

## 2020-06-29 VITALS — Ht 62.0 in | Wt 189.0 lb

## 2020-06-29 DIAGNOSIS — Z86010 Personal history of colonic polyps: Secondary | ICD-10-CM

## 2020-06-29 MED ORDER — POLYETHYLENE GLYCOL 3350 17 GM/SCOOP PO POWD: 1.0000 | Freq: Once | ORAL | 0 refills | Status: AC

## 2020-06-29 MED ORDER — BISACODYL EC 5 MG PO TBEC: 5.0000 mg | DELAYED_RELEASE_TABLET | Freq: Once | ORAL | 0 refills | Status: AC

## 2020-06-29 MED ORDER — PLENVU 140 G PO SOLR: 1.0000 | ORAL | 0 refills | Status: DC

## 2020-06-29 NOTE — Progress Notes (Signed)
Pre visit completed via phone call with the patient/nurse from facility-[name]; Patient's name, DOB, and address verified by patient;  No egg or soy allergy known to patient  No issues with past sedation with any surgeries or procedures Patient denies ever being told they had issues or difficulty with intubation  No FH of Malignant Hyperthermia No diet pills per patient No home 02 use per patient  No blood thinners per patient  Pt reports issues with constipation ---will proceed with 2 day prep as er CT scan showing colon full of stool No A fib or A flutter  COVID 19 guidelines implemented in PV today with Pt and RN  Coupon given to pt in PV today, Code to Pharmacy and  NO PA's for preps discussed with pt in PV today  Discussed with pt there will be an out-of-pocket cost for prep and that varies from $0 to 70 dollars  Due to the COVID-19 pandemic we are asking patients to follow certain guidelines. Pt aware of COVID protocols and [facility] guidelines

## 2020-07-08 ENCOUNTER — Telehealth: Payer: Self-pay | Admitting: Internal Medicine

## 2020-07-08 NOTE — Telephone Encounter (Signed)
[id number]  Exp 12/2020  Sample to Facility for pt- Facility will pick it up 3rd floor

## 2020-07-13 ENCOUNTER — Encounter: Payer: Self-pay | Admitting: Internal Medicine

## 2020-07-13 ENCOUNTER — Ambulatory Visit (AMBULATORY_SURGERY_CENTER): Payer: Medicare Other | Admitting: Internal Medicine

## 2020-07-13 ENCOUNTER — Other Ambulatory Visit: Payer: Self-pay

## 2020-07-13 VITALS — BP 150/86 | HR 70 | Temp 96.2°F | Resp 16 | Ht 62.0 in | Wt 189.0 lb

## 2020-07-13 DIAGNOSIS — D125 Benign neoplasm of sigmoid colon: Secondary | ICD-10-CM

## 2020-07-13 DIAGNOSIS — D124 Benign neoplasm of descending colon: Secondary | ICD-10-CM | POA: Diagnosis not present

## 2020-07-13 DIAGNOSIS — Z860100 Personal history of colon polyps, unspecified: Secondary | ICD-10-CM

## 2020-07-13 MED ORDER — SODIUM CHLORIDE 0.9 % IV SOLN: 500.0000 mL | Freq: Once | INTRAVENOUS | Status: DC

## 2020-07-13 NOTE — Progress Notes (Signed)
Called to room to assist during endoscopic procedure.  Patient ID and intended procedure confirmed with present staff. Received instructions for my participation in the procedure from the performing physician.Called to room to assist during endoscopic procedure.  Patient ID and intended procedure confirmed with present staff. Received instructions for my participation in the procedure from the performing physician. 

## 2020-07-13 NOTE — Progress Notes (Signed)
Pt's states no medical or surgical changes since previsit or office visit. 

## 2020-07-13 NOTE — Op Note (Signed)
[facility] Patient Name: [name] Procedure Date: 07/13/2020 12:03 PM MRN: [id number] Endoscopist: Docia Chuck. Henrene Pastor , MD Age: 80 Referring MD:  Date of Birth: Jul 30, 1940 Gender: Female Account #: [id number] Procedure:                Colonoscopy with cold snare polypectomy x 2 Indications:              High risk colon cancer surveillance: Personal                            history of non-advanced adenoma. 2013 Medicines:                Monitored Anesthesia Care Procedure:                Pre-Anesthesia Assessment:                           - Prior to the procedure, a History and Physical                            was performed, and patient medications and                            allergies were reviewed. The patient's tolerance of                            previous anesthesia was also reviewed. The risks                            and benefits of the procedure and the sedation                            options and risks were discussed with the patient.                            All questions were answered, and informed consent                            was obtained. Prior Anticoagulants: The patient has                            taken no previous anticoagulant or antiplatelet                            agents. ASA Grade Assessment: II - A patient with                            mild systemic disease. After reviewing the risks                            and benefits, the patient was deemed in                            satisfactory condition to undergo the procedure.  After obtaining informed consent, the colonoscope                            was passed under direct vision. Throughout the                            procedure, the patient's blood pressure, pulse, and                            oxygen saturations were monitored continuously. The                            Olympus CF-HQ190 [id number]) Colonoscope was                             introduced through the anus and advanced to the the                            cecum, identified by appendiceal orifice and                            ileocecal valve. The ileocecal valve, appendiceal                            orifice, and rectum were photographed. The quality                            of the bowel preparation was excellent. The                            colonoscopy was performed without difficulty. The                            patient tolerated the procedure well. The bowel                            preparation used was SUPREP via split dose                            instruction. Scope In: 12:10:39 PM Scope Out: 12:24:31 PM Scope Withdrawal Time: 0 hours 11 minutes 52 seconds  Total Procedure Duration: 0 hours 13 minutes 52 seconds  Findings:                 Two polyps were found in the sigmoid colon and                            descending colon. The polyps were 1 to 3 mm in                            size. These polyps were removed with a cold snare.                            Resection and retrieval were complete.  Multiple diverticula were found in the entire                            colon. There was a healthy-appearing surgical                            anastomosis in the sigmoid colon at 20 cm.                           The exam was otherwise without abnormality on                            direct and retroflexion views. Complications:            No immediate complications. Estimated blood loss:                            None. Estimated Blood Loss:     Estimated blood loss: none. Impression:               - Two 1 to 3 mm polyps in the sigmoid colon and in                            the descending colon, removed with a cold snare.                            Resected and retrieved.                           - Diverticulosis in the entire examined colon.                            Status post sigmoid colectomy                            - The examination was otherwise normal on direct                            and retroflexion views. Recommendation:           - Repeat colonoscopy is not recommended for                            surveillance.                           - Patient has a contact number available for                            emergencies. The signs and symptoms of potential                            delayed complications were discussed with the                            patient. Return to normal activities tomorrow.  Written discharge instructions were provided to the                            patient.                           - Resume previous diet.                           - Continue present medications.                           - Await pathology results. Docia Chuck. Henrene Pastor, MD 07/13/2020 12:28:34 PM This report has been signed electronically.

## 2020-07-13 NOTE — Patient Instructions (Signed)
2 Polyps removed (handout given) Diverticulosis (handout given)  YOU HAD AN ENDOSCOPIC PROCEDURE TODAY AT [facility]:   Refer to the procedure report that was given to you for any specific questions about what was found during the examination.  If the procedure report does not answer your questions, please call your gastroenterologist to clarify.  If you requested that your care partner not be given the details of your procedure findings, then the procedure report has been included in a sealed envelope for you to review at your convenience later.  YOU SHOULD EXPECT: Some feelings of bloating in the abdomen. Passage of more gas than usual.  Walking can help get rid of the air that was put into your GI tract during the procedure and reduce the bloating. If you had a lower endoscopy (such as a colonoscopy or flexible sigmoidoscopy) you may notice spotting of blood in your stool or on the toilet paper. If you underwent a bowel prep for your procedure, you may not have a normal bowel movement for a few days.  Please Note:  You might notice some irritation and congestion in your nose or some drainage.  This is from the oxygen used during your procedure.  There is no need for concern and it should clear up in a day or so.  SYMPTOMS TO REPORT IMMEDIATELY:   Following lower endoscopy (colonoscopy or flexible sigmoidoscopy):  Excessive amounts of blood in the stool  Significant tenderness or worsening of abdominal pains  Swelling of the abdomen that is new, acute  Fever of 100F or higher   For urgent or emergent issues, a gastroenterologist can be reached at any hour by calling [phone]. Do not use MyChart messaging for urgent concerns.    DIET:  We do recommend a small meal at first, but then you may proceed to your regular diet.  Drink plenty of fluids but you should avoid alcoholic beverages for 24 hours.  ACTIVITY:  You should plan to take it easy for the rest of today and  you should NOT DRIVE or use heavy machinery until tomorrow (because of the sedation medicines used during the test).    FOLLOW UP: Our staff will call the number listed on your records 48-72 hours following your procedure to check on you and address any questions or concerns that you may have regarding the information given to you following your procedure. If we do not reach you, we will leave a message.  We will attempt to reach you two times.  During this call, we will ask if you have developed any symptoms of COVID 19. If you develop any symptoms (ie: fever, flu-like symptoms, shortness of breath, cough etc.) before then, please call [phone].  If you test positive for Covid 19 in the 2 weeks post procedure, please call and report this information to [place].    If any biopsies were taken you will be contacted by phone or by letter within the next 1-3 weeks.  Please call us at [phone] if you have not heard about the biopsies in 3 weeks.    SIGNATURES/CONFIDENTIALITY: You and/or your care partner have signed paperwork which will be entered into your electronic medical record.  These signatures attest to the fact that that the information above on your After Visit Summary has been reviewed and is understood.  Full responsibility of the confidentiality of this discharge information lies with you and/or your care-partner.

## 2020-07-13 NOTE — Progress Notes (Signed)
pt tolerated well. VSS. awake and to recovery. Report given to RN.  

## 2020-07-15 ENCOUNTER — Telehealth: Payer: Self-pay | Admitting: *Deleted

## 2020-07-15 ENCOUNTER — Telehealth: Payer: Self-pay

## 2020-07-15 NOTE — Telephone Encounter (Signed)
  Follow up Call-  Call back number 07/13/2020  Post procedure Call Back phone  # [phone]  Permission to leave phone message Yes  Some recent data might be hidden     Patient questions:  Do you have a fever, pain , or abdominal swelling? No. Pain Score  0 *  Have you tolerated food without any problems? Yes.    Have you been able to return to your normal activities? Yes.    Do you have any questions about your discharge instructions: Diet   No. Medications  No. Follow up visit  No.  Do you have questions or concerns about your Care? No.  Actions: * If pain score is 4 or above: No action needed, pain <4.  1. Have you developed a fever since your procedure? no  2.   Have you had an respiratory symptoms (SOB or cough) since your procedure? no  3.   Have you tested positive for COVID 19 since your procedure no  4.   Have you had any family members/close contacts diagnosed with the COVID 19 since your procedure?  no   If yes to any of these questions please route to Joylene John, RN and Joella Prince, RN

## 2020-07-15 NOTE — Telephone Encounter (Signed)
Attempted to reach patient for post-procedure f/u call. No answer, Unable to leave message as nursing care station did not pick up or allow message. Staff will make another attempt to reach her later today.

## 2020-07-18 ENCOUNTER — Encounter: Payer: Self-pay | Admitting: Internal Medicine

## 2020-09-12 ENCOUNTER — Other Ambulatory Visit: Payer: Self-pay

## 2020-09-12 ENCOUNTER — Encounter: Payer: Self-pay | Admitting: Podiatry

## 2020-09-12 ENCOUNTER — Ambulatory Visit (INDEPENDENT_AMBULATORY_CARE_PROVIDER_SITE_OTHER): Payer: Medicare Other | Admitting: Podiatry

## 2020-09-12 DIAGNOSIS — M79675 Pain in left toe(s): Secondary | ICD-10-CM | POA: Diagnosis not present

## 2020-09-12 DIAGNOSIS — B351 Tinea unguium: Secondary | ICD-10-CM

## 2020-09-12 DIAGNOSIS — M79674 Pain in right toe(s): Secondary | ICD-10-CM | POA: Diagnosis not present

## 2020-09-12 NOTE — Progress Notes (Signed)
This patient returns to my office for at risk foot care.  This patient requires this care by a professional since this patient will be at risk due to having  CKD.  No diabetes.  This patient is unable to cut nails herself since the patient cannot reach her nails.These nails are painful walking and wearing shoes.  This patient presents for at risk foot care today.  General Appearance  Alert, conversant and in no acute stress.  Vascular  Dorsalis pedis and posterior tibial  pulses are palpable  bilaterally.  Capillary return is within normal limits  bilaterally. Temperature is within normal limits  bilaterally.  Neurologic  Senn-Weinstein monofilament wire test within normal limits  bilaterally. Muscle power within normal limits bilaterally.  Nails Thick disfigured discolored nails with subungual debris  Hallux  bilaterally. No evidence of bacterial infection or drainage bilaterally.  Orthopedic  No limitations of motion  feet .  No crepitus or effusions noted.  No bony pathology or digital deformities noted.  Skin  normotropic skin with no porokeratosis noted bilaterally.  No signs of infections or ulcers noted.     Onychomycosis  Pain in right toes  Pain in left toes  Consent was obtained for treatment procedures.   Mechanical debridement of nails 1-5  bilaterally performed with a nail nipper.  Filed with dremel without incident.    Return office visit     4 months                 Told patient to return for periodic foot care and evaluation due to potential at risk complications.   Gardiner Barefoot DPM

## 2021-01-13 ENCOUNTER — Ambulatory Visit: Payer: Medicare Other | Admitting: Podiatry

## 2021-04-17 ENCOUNTER — Encounter (INDEPENDENT_AMBULATORY_CARE_PROVIDER_SITE_OTHER): Payer: Commercial Managed Care - HMO | Admitting: Ophthalmology

## 2021-04-17 ENCOUNTER — Other Ambulatory Visit: Payer: Self-pay

## 2021-04-17 DIAGNOSIS — H35033 Hypertensive retinopathy, bilateral: Secondary | ICD-10-CM

## 2021-04-17 DIAGNOSIS — H4423 Degenerative myopia, bilateral: Secondary | ICD-10-CM

## 2021-04-17 DIAGNOSIS — H43813 Vitreous degeneration, bilateral: Secondary | ICD-10-CM | POA: Diagnosis not present

## 2021-04-17 DIAGNOSIS — I10 Essential (primary) hypertension: Secondary | ICD-10-CM

## 2021-04-29 ENCOUNTER — Other Ambulatory Visit: Payer: Self-pay

## 2021-04-29 ENCOUNTER — Emergency Department (HOSPITAL_COMMUNITY): Payer: Medicare Other

## 2021-04-29 ENCOUNTER — Emergency Department (HOSPITAL_COMMUNITY)
Admission: EM | Admit: 2021-04-29 | Discharge: 2021-04-29 | Disposition: A | Discharge status: Skilled Nursing Facility | Payer: Medicare Other | Attending: Emergency Medicine | Admitting: Emergency Medicine

## 2021-04-29 ENCOUNTER — Encounter (HOSPITAL_COMMUNITY): Payer: Self-pay | Admitting: Emergency Medicine

## 2021-04-29 DIAGNOSIS — M1611 Unilateral primary osteoarthritis, right hip: Secondary | ICD-10-CM | POA: Insufficient documentation

## 2021-04-29 DIAGNOSIS — M7918 Myalgia, other site: Secondary | ICD-10-CM

## 2021-04-29 DIAGNOSIS — M25551 Pain in right hip: Secondary | ICD-10-CM | POA: Diagnosis present

## 2021-04-29 MED ORDER — TRAMADOL HCL 50 MG PO TABS
50.0000 mg | ORAL_TABLET | Freq: Once | ORAL | Status: AC
  Administered 2021-04-29: 50 mg via ORAL
  Filled 2021-04-29: qty 1

## 2021-04-29 NOTE — ED Notes (Signed)
Patient transported to X-ray 

## 2021-04-29 NOTE — ED Notes (Signed)
Pt assisted to restroom via wheel chair. Able to take small steps to bathroom without assist. Reports some pain relief.

## 2021-04-29 NOTE — Discharge Instructions (Signed)
Your xrays are all OK, they do not show any signs of broken bones, I suspect that much of your pain is coming from your sacroiliac joint in your buttock on the right side.  This will go down with anti-inflammatory, such as Aleve or ibuprofen.  If you are not allergic to these medications take ibuprofen 400 mg 3 times a day, otherwise Tylenol 650 mg every 6 hours.  See the phone number above for Dr. Aline Brochure, our local orthopedist you will likely need to follow-up as you do have severe arthritis of your hip

## 2021-04-29 NOTE — ED Triage Notes (Signed)
Patient brought in via EMS from [facility]. Alert and oriented. Airway patent. Patient c/o right hip/buttock pain. Denies any injury. No shortening or rotation. Pedal pulses strong, capillary refill WNL. Dr Sabra Heck at bedside assessing patient.

## 2021-04-29 NOTE — ED Notes (Signed)
Gave pt snack.

## 2021-04-29 NOTE — ED Provider Notes (Signed)
[facility] EMERGENCY DEPARTMENT Provider Note   CSN: [id number] Arrival date & time: 04/29/21  1791     History  Chief Complaint  Patient presents with   Hip Pain    [name] is a 81 y.o. female.   Hip Pain   This patient is a 81 year old female, she comes from the nursing facility today with a complaint of right-sided buttock and hip pain, according to the EMS personnel and the patient she has had this pain intermittently for quite some time but today it seems to be worse than usual.  The paramedics report that she was able to stand up and pivot to get onto the stretcher all by herself without any difficulty.  There has been no trauma no falls and she was not noted to have shortening or external rotation of the leg.  She denies prior surgery on this leg and has had no fevers chills nausea vomiting redness or swelling.  She has had normal bowel movements, normal urinary habits, no coughing or shortness of breath.  Home Medications Prior to Admission medications   Medication Sig Start Date End Date Taking? Authorizing Provider  albuterol (PROVENTIL HFA;VENTOLIN HFA) 108 (90 Base) MCG/ACT inhaler Inhale 2 puffs into the lungs every 6 (six) hours as needed for wheezing or shortness of breath. 08/15/17   [name], PA-C  BREO ELLIPTA 200-25 MCG/INH AEPB Inhale 1 puff into the lungs daily. 06/22/20   [provider]  gabapentin (NEURONTIN) 100 MG capsule Take 100 mg by mouth daily. 06/09/20   [provider]  ibuprofen (ADVIL,MOTRIN) 800 MG tablet Take 1 tablet (800 mg total) by mouth every 8 (eight) hours as needed (pain). 12/13/17   Domenic Moras, PA-C  melatonin 3 MG TABS tablet Take 3 mg by mouth at bedtime.    [provider]  omeprazole (PRILOSEC) 20 MG capsule Take 1 capsule (20 mg total) by mouth daily. 06/19/19   Palumbo, April, MD  risperiDONE (RISPERDAL) 2 MG tablet Take 2 mg by mouth 2 (two) times daily. 06/09/20   [provider]  traZODone  (DESYREL) 50 MG tablet Take 50 mg by mouth at bedtime as needed for sleep.    [provider]  vitamin B-12 (CYANOCOBALAMIN) 1000 MCG tablet Take 1,000 mcg by mouth daily. 03/09/20   [provider]      Allergies    Haloperidol, Aripiprazole, and Chlorpheniramine-pse-ibuprofen    Review of Systems   Review of Systems  All other systems reviewed and are negative.  Physical Exam Updated Vital Signs BP (!) 154/83 (BP Location: Right Wrist)    Pulse 70    Temp 99.2 F (37.3 C) (Oral)    Resp 14    Ht 1.588 m (5' 2.5")    Wt 81.2 kg    SpO2 96%    BMI 32.22 kg/m  Physical Exam Vitals and nursing note reviewed.  Constitutional:      General: She is not in acute distress.    Appearance: She is well-developed.  HENT:     Head: Normocephalic and atraumatic.     Mouth/Throat:     Pharynx: No oropharyngeal exudate.  Eyes:     General: No scleral icterus.       Right eye: No discharge.        Left eye: No discharge.     Conjunctiva/sclera: Conjunctivae normal.     Pupils: Pupils are equal, round, and reactive to light.  Neck:     Thyroid: No  thyromegaly.     Vascular: No JVD.  Cardiovascular:     Rate and Rhythm: Normal rate and regular rhythm.     Heart sounds: Normal heart sounds. No murmur heard.   No friction rub. No gallop.  Pulmonary:     Effort: Pulmonary effort is normal. No respiratory distress.     Breath sounds: Normal breath sounds. No wheezing or rales.  Abdominal:     General: Bowel sounds are normal. There is no distension.     Palpations: Abdomen is soft. There is no mass.     Tenderness: There is no abdominal tenderness.  Musculoskeletal:        General: No tenderness. Normal range of motion.     Cervical back: Normal range of motion and neck supple.  Lymphadenopathy:     Cervical: No cervical adenopathy.  Skin:    General: Skin is warm and dry.     Findings: No erythema or rash.  Neurological:     Mental Status: She is alert.      Coordination: Coordination normal.     Comments: The patient is able to fully range both of her legs, she can flex at the hips, flex at the knees, she has pain in the hips and the knees with doing this but it does not restrict her range of motion which is very supple.  She has no pain with passive range of motion of these joints either.  She does have tenderness in the buttock right more than left, it seems to be higher up towards the SI joint.  There is no redness of the overlying skin in these areas, chaperone present for this exam  Psychiatric:        Behavior: Behavior normal.    ED Results / Procedures / Treatments   Labs (all labs ordered are listed, but only abnormal results are displayed) Labs Reviewed - No data to display  EKG None  Radiology DG Hips Bilat W or Wo Pelvis 2 Views  Result Date: 04/29/2021 CLINICAL DATA:  Hip and pelvic pain EXAM: DG HIP (WITH OR WITHOUT PELVIS) 2V BILAT COMPARISON:  CT pelvis 05/08/2020 FINDINGS: Mild spurring of both acetabula with mild degenerative chondral thinning in both hips. Lower lumbar spondylosis and degenerative disc disease. Mild spurring of both femoral heads, slightly more prominent posteriorly in the right hip. No definite fracture or acute bony finding. IMPRESSION: 1. Moderate degenerative arthropathy of both hips. Electronically Signed   By: Van Clines M.D.   On: 04/29/2021 19:57    Procedures Procedures    Medications Ordered in ED Medications  traMADol (ULTRAM) tablet 50 mg (50 mg Oral Given 04/29/21 1921)    ED Course/ Medical Decision Making/ A&P                           Medical Decision Making Amount and/or Complexity of Data Reviewed Radiology: ordered.  Risk Prescription drug management.   This patient presents to the ED for concern of buttock pain, this involves an extensive number of treatment options, and is a complaint that carries with it a high risk of complications and morbidity.  The differential  diagnosis includes Sciatica, would also consider muscular pain, she may have had some type of fall and pelvic fracture should be entertained.  I doubt that this is related to septic joints or trauma to the joints but x-rays will be obtained as pain medications are given   Co morbidities that  complicate the patient evaluation  Elderly, very immobile at baseline   Additional history obtained:  Additional history obtained from Paramedics External records from outside source obtained and reviewed including I have reviewed the medical history including the electronic medical record and it does not appear that she has been seen in the emergency department for hip or buttock problem since 2019 when she had a strained hip muscle, no fractures     Imaging Studies ordered:  I ordered imaging studies including hips and pelvis  I independently visualized and interpreted imaging which showed no acute fractures, has some advanced arthritis of the hips bialterally I agree with the radiologist interpretation   Medicines ordered and prescription drug management:  I ordered medication including tramadol  for pain  Reevaluation of the patient after these medicines showed that the patient improved I have reviewed the patients home medicines and have made adjustments as needed   Test Considered:  CT scan but not needed without joint pain or trauma.   Critical Interventions:  Pain control   Problem List / ED Course:  Buttock pain - pain meds - no fracture   Reevaluation:  After the interventions noted above, I reevaluated the patient and found that they have :improved   Social Determinants of Health:  Nursing home   Dispostion:  After consideration of the diagnostic results and the patients response to treatment, I feel that the patent would benefit from Home and f/u with ortho.          Final Clinical Impression(s) / ED Diagnoses Final diagnoses:  Right buttock pain     Rx / DC Orders ED Discharge Orders     None         Noemi Chapel, MD 04/29/21 2035

## 2021-04-29 NOTE — ED Notes (Signed)
Gave report to facility . They said it would be a little while before anyone is able to come get her. They have to call in transportation. Educated that since she can  walk she does not meet criteria.

## 2021-05-05 LAB — TSH: TSH: 0.39 — AB (ref 0.41–5.90)

## 2021-05-09 ENCOUNTER — Encounter: Payer: Self-pay | Admitting: Orthopedic Surgery

## 2021-05-09 ENCOUNTER — Other Ambulatory Visit: Payer: Self-pay

## 2021-05-09 ENCOUNTER — Ambulatory Visit (INDEPENDENT_AMBULATORY_CARE_PROVIDER_SITE_OTHER): Payer: Medicare Other | Admitting: Orthopedic Surgery

## 2021-05-09 VITALS — BP 137/86 | HR 90 | Ht 62.5 in | Wt 173.0 lb

## 2021-05-09 DIAGNOSIS — M545 Low back pain, unspecified: Secondary | ICD-10-CM | POA: Diagnosis not present

## 2021-05-09 NOTE — Progress Notes (Signed)
New Patient Visit  Assessment: [name] is a 81 y.o. female with the following: 1. Acute right-sided low back pain without sciatica  Plan: Patient has right-sided low back pain, without radiating pains into her leg.  Since seen in the emergency department approximately 10 days ago, her pain has been proved.  Nothing further is needed at this time.  She continue with medications as needed.  Encouraged her to walk, potentially using a walker to assist with ambulation.  Follow-up as needed.   Follow-up: Return if symptoms worsen or fail to improve.  Subjective:  Chief Complaint  Patient presents with   Hip Pain    Rt hip pain getting worse over the past.     History of Present Illness: [name] is a 81 y.o. female who presents for evaluation of right buttock pain.  She states that she has had pain in the right buttock intermittently for at least a year.  Approximately 10 days ago, she had an acute worsening.  No specific onset.  No falls.  Pain was directly in her right buttock area.  This does not radiate distally.  Since being evaluated in the emergency department, her pain has improved.  She currently reports she is not having much pain.  She is been taking Tylenol for pain.  No specific therapies.  She is a part of a walking club in her assisted living facility.   Review of Systems: No fevers or chills No numbness or tingling No chest pain No shortness of breath No bowel or bladder dysfunction No GI distress No headaches   Medical History:  Past Medical History:  Diagnosis Date   Anemia 1990   hx of   Anxiety    Arthritis 2004   Asthma 1995   uses inhaler   Chronic kidney disease 2010   LEFT kidney removed   Colon polyp    Complication of anesthesia 1966   first surgery she had- had a hard time waking up from surgery   Diverticulosis 2003   GERD (gastroesophageal reflux disease)    on meds    Past Surgical History:  Procedure Laterality Date    APPENDECTOMY  1966   CATARACT EXTRACTION     Bilateral   CHOLECYSTECTOMY  2004   COLON SURGERY  2008   COLONOSCOPY  2013   JP-MAC-moviprep(good)-TA   COLOSTOMY     COLOSTOMY TAKEDOWN     DIAGNOSTIC LAPAROSCOPY     exploratory for endometriosis   HEEL SPUR SURGERY     Right, Metal Plate in [place]  2010   NEPHRECTOMY Left 2297   As complication of partial colectomy for diverticulitis   POLYPECTOMY  2013   TA   TONSILLECTOMY     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   Procedure: LEFT TOTAL KNEE ARTHROPLASTY;  Surgeon: Susa Day, MD;  Location: [facility];  Service: Orthopedics;  Laterality: Left;  Adductor Block    Family History  Problem Relation Age of Onset   Colon polyps Other        niece   Diabetes Paternal Grandmother    Diabetes Paternal Aunt    Heart failure Mother    Heart attack Father    Stomach cancer Neg Hx    Rectal cancer Neg Hx    Esophageal cancer Neg Hx    Colon cancer Neg Hx    Social History   Tobacco Use   Smoking status: Never   Smokeless tobacco: Never  Vaping Use   Vaping Use: Never used  Substance Use Topics   Alcohol use: Not Currently    Comment: occassionally   Drug use: No    Allergies  Allergen Reactions   Haloperidol Anaphylaxis   Aripiprazole Other (See Comments)    Bad thoughts    Chlorpheniramine-Pse-Ibuprofen     Other reaction(s): Cough    No outpatient medications have been marked as taking for the 05/09/21 encounter (Office Visit) with Mordecai Rasmussen, MD.    Objective: BP 137/86    Pulse 90    Ht 5' 2.5" (1.588 m)    Wt 173 lb (78.5 kg)    BMI 31.14 kg/m   Physical Exam:  General: Elderly female., Alert and oriented., and No acute distress. Gait: Slow, steady gait.  Evaluation of lower back demonstrates no deformity.  No redness is appreciated.  She has mild tenderness to palpation on the right side of the lower back, including the right buttock.  Negative straight leg raise.  1+  patellar tendon reflex.  Sensation is intact throughout the right lower extremity.  Toes are warm and well perfused.  IMAGING: I personally reviewed images previously obtained from the ED  X-ray of the right hip are without acute injury.  Minimal degenerative changes.  Normal-appearing right x-ray.  New Medications:  No orders of the defined types were placed in this encounter.     Mordecai Rasmussen, MD  05/09/2021 10:34 AM

## 2021-05-30 ENCOUNTER — Encounter: Payer: Self-pay | Admitting: Nurse Practitioner

## 2021-05-30 ENCOUNTER — Ambulatory Visit (INDEPENDENT_AMBULATORY_CARE_PROVIDER_SITE_OTHER): Payer: Medicare Other | Admitting: Nurse Practitioner

## 2021-05-30 ENCOUNTER — Other Ambulatory Visit: Payer: Self-pay

## 2021-05-30 VITALS — BP 120/73 | HR 77 | Ht 62.75 in | Wt 176.4 lb

## 2021-05-30 DIAGNOSIS — R7989 Other specified abnormal findings of blood chemistry: Secondary | ICD-10-CM | POA: Diagnosis not present

## 2021-05-30 DIAGNOSIS — E039 Hypothyroidism, unspecified: Secondary | ICD-10-CM | POA: Diagnosis not present

## 2021-05-30 NOTE — Patient Instructions (Signed)
Thyroid-Stimulating Hormone Test Why am I having this test? The thyroid is a gland in the lower front of the neck. It makes hormones that affect many body parts and systems, including the system that affects how quickly the body burns fuel for energy (metabolism). The pituitary gland is located just below the brain, behind the eyes and nasal passages. It helps maintain thyroid hormone levels and thyroid gland function. You may have a thyroid-stimulating hormone (TSH) test if you have possible symptoms of abnormal thyroid hormone levels. This test can help your health care provider: Diagnose a disorder of the thyroid gland or pituitary gland. Manage your condition and treatment if you have an underactive thyroid (hypothyroidism) or an overactive thyroid (hyperthyroidism). Newborn babies may have this test done to screen for hypothyroidism that is present at birth (congenital). What is being tested? This test measures the amount of TSH in your blood. TSH may also be called thyrotropin. When the thyroid does not make enough hormones, the pituitary gland releases TSH into the bloodstream to stimulate the thyroid gland to make more hormones. What kind of sample is taken?   A blood sample is required for this test. It is usually collected by inserting a needle into a blood vessel. For newborns, a small amount of blood may be collected from the umbilical cord, or by using a small needle to prick the baby's heel (heel stick). Tell a health care provider about: All medicines you are taking, including vitamins, herbs, eye drops, creams, and over-the-counter medicines. Any blood disorders you have. Any surgeries you have had. Any medical conditions you have. Whether you are pregnant or may be pregnant. How are the results reported? Your test results will be reported as a value that indicates how much TSH is in your blood. Your health care provider will compare your results to normal ranges that were  established after testing a large group of people (reference ranges). Reference ranges may vary among labs and hospitals. For this test, common reference ranges are: Adult: 2-10 microunits/mL or 2-10 milliunits/L. Newborn: Heel stick: 3-18 microunits/mL or 3-18 milliunits/L. Umbilical cord: 5-78 microunits/mL or 3-12 milliunits/L. What do the results mean? Results that are within the reference range are considered normal. This means that you have a normal amount of TSH in your blood. Results that are higher than the reference range mean that your TSH levels are too high. This may mean: Your thyroid gland is not making enough thyroid hormones. Your thyroid medicine dosage is too low. You have a tumor on your pituitary gland. This is rare. Results that are lower than the reference range mean that your TSH levels are too low. This may be caused by hyperthyroidism or by a problem with the pituitary gland function. Talk with your health care provider about what your results mean. Questions to ask your health care provider Ask your health care provider, or the department that is doing the test: When will my results be ready? How will I get my results? What are my treatment options? What other tests do I need? What are my next steps? Summary You may have a thyroid-stimulating hormone (TSH) test if you have possible symptoms of abnormal thyroid hormone levels. The thyroid is a gland in the lower front of the neck. It makes hormones that affect many body parts and systems. The pituitary gland is located just below the brain, behind the eyes and nasal passages. It helps maintain thyroid hormone levels and thyroid gland function. This test measures the  amount of TSH in your blood. TSH is made by the pituitary gland. It may also be called thyrotropin. This information is not intended to replace advice given to you by your health care provider. Make sure you discuss any questions you have with your  health care provider. Document Revised: 12/01/2020 Document Reviewed: 12/10/2019 Elsevier Patient Education  2022 [employer].

## 2021-05-30 NOTE — Progress Notes (Signed)
05/30/2021     Endocrinology Consult Note    Subjective:    Patient ID: [name], female    DOB: 06/26/1940, PCP Lubertha Sayres, [place].   Past Medical History:  Diagnosis Date   Anemia 1990   hx of   Anxiety    Arthritis 2004   Asthma 1995   uses inhaler   Chronic kidney disease 2010   LEFT kidney removed   Colon polyp    Complication of anesthesia 1966   first surgery she had- had a hard time waking up from surgery   Diverticulosis 2003   GERD (gastroesophageal reflux disease)    on meds    Past Surgical History:  Procedure Laterality Date   APPENDECTOMY  1966   CATARACT EXTRACTION     Bilateral   CHOLECYSTECTOMY  2004   COLON SURGERY  2008   COLONOSCOPY  2013   JP-MAC-moviprep(good)-TA   COLOSTOMY     COLOSTOMY TAKEDOWN     DIAGNOSTIC LAPAROSCOPY     exploratory for endometriosis   HEEL SPUR SURGERY     Right, Metal Plate in [place]  2010   NEPHRECTOMY Left 7353   As complication of partial colectomy for diverticulitis   POLYPECTOMY  2013   TA   TONSILLECTOMY     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   Procedure: LEFT TOTAL KNEE ARTHROPLASTY;  Surgeon: Susa Day, MD;  Location: [facility];  Service: Orthopedics;  Laterality: Left;  Adductor Block    Social History   Socioeconomic History   Marital status: Legally Separated    Spouse name: Not on file   Number of children: 10   Years of education: Not on file   Highest education level: Not on file  Occupational History   Occupation: retired  Tobacco Use   Smoking status: Never   Smokeless tobacco: Never  Vaping Use   Vaping Use: Never used  Substance and Sexual Activity   Alcohol use: Not Currently    Comment: occassionally   Drug use: No   Sexual activity: Not Currently  Other Topics Concern   Not on file  Social History Narrative   Not on file   Social Determinants of Health   Financial Resource Strain: Not on file  Food Insecurity: Not on  file  Transportation Needs: Not on file  Physical Activity: Not on file  Stress: Not on file  Social Connections: Not on file    Family History  Problem Relation Age of Onset   Hypertension Mother    Heart failure Mother    Hyperlipidemia Mother    Stroke Mother    Hypertension Father    Heart attack Father    COPD Father    Diabetes Paternal Aunt    Diabetes Paternal Grandmother    Colon polyps Other        niece   Stomach cancer Neg Hx    Rectal cancer Neg Hx    Esophageal cancer Neg Hx    Colon cancer Neg Hx     Outpatient Encounter Medications as of 05/30/2021  Medication Sig   amantadine (SYMMETREL) 100 MG capsule Take 100 mg by mouth 2 (two) times daily.   benztropine (COGENTIN) 0.5 MG tablet Take 0.5 mg by mouth 2 (two) times daily.   BREO ELLIPTA 200-25 MCG/INH AEPB Inhale 1 puff into the lungs daily.   escitalopram (LEXAPRO) 10 MG tablet Take 10 mg by mouth daily.   fluticasone (FLONASE)  50 MCG/ACT nasal spray Place into both nostrils.   gabapentin (NEURONTIN) 100 MG capsule Take 100 mg by mouth daily.   ibuprofen (ADVIL,MOTRIN) 800 MG tablet Take 1 tablet (800 mg total) by mouth every 8 (eight) hours as needed (pain).   loperamide (IMODIUM) 2 MG capsule Take by mouth.   melatonin 3 MG TABS tablet Take 3 mg by mouth at bedtime.   omeprazole (PRILOSEC) 20 MG capsule Take 1 capsule (20 mg total) by mouth daily.   risperiDONE (RISPERDAL) 2 MG tablet Take 2 mg by mouth 2 (two) times daily.   traZODone (DESYREL) 150 MG tablet Take 150 mg by mouth at bedtime.   vitamin B-12 (CYANOCOBALAMIN) 1000 MCG tablet Take 1,000 mcg by mouth daily.   albuterol (PROVENTIL HFA;VENTOLIN HFA) 108 (90 Base) MCG/ACT inhaler Inhale 2 puffs into the lungs every 6 (six) hours as needed for wheezing or shortness of breath. (Patient not taking: Reported on 05/30/2021)   traZODone (DESYREL) 50 MG tablet Take 50 mg by mouth at bedtime as needed for sleep. (Patient not taking: Reported on 05/30/2021)    No facility-administered encounter medications on file as of 05/30/2021.    ALLERGIES: Allergies  Allergen Reactions   Haloperidol Anaphylaxis   Aripiprazole Other (See Comments)    Bad thoughts    Chlorpheniramine-Pse-Ibuprofen     Other reaction(s): Cough    VACCINATION STATUS: Immunization History  Administered Date(s) Administered   Influenza,inj,Quad PF,6+ Mos 01/12/2017   Influenza-Unspecified 01/06/2016   PPD Test 04/08/2016   Pneumococcal Conjugate-13 04/09/2014   Pneumococcal Polysaccharide-23 04/09/2009   Tdap 04/09/2013     HPI  [name] is 81 y.o. female who presents today with a medical history as above. she is being seen in consultation for hyperthyroidism requested by Lubertha Sayres, [place].  she has been dealing with symptoms of diarrhea, unintentional weight loss, raspy voice, tremors, and intermittent anxiety/irritability for 6-8 months. These symptoms are progressively worsening and troubling to her.  her most recent thyroid labs revealed marginally suppressed TSH of 0.39 on 05/05/21.  she denies choking, shortness of breath but does endorse recent voice change and intermittent dysphagia.   she denies family history of thyroid dysfunction and denies family hx of thyroid cancer. she denies personal history of goiter. she is not on any anti-thyroid medications nor on any thyroid hormone supplements (but she does report she was on Levothyroxine many years ago- since discontinued).  Denies use of Biotin containing supplements.  she is willing to proceed with appropriate work up and therapy for thyrotoxicosis.   Review of systems  Constitutional: + Minimally fluctuating body weight, current Body mass index is 31.5 kg/m., no fatigue, no subjective hyperthermia, no subjective hypothermia Eyes: no blurry vision, no xerophthalmia ENT: no sore throat, no nodules palpated in throat, + dysphagia, no odynophagia, + hoarseness Cardiovascular: no chest pain, no  shortness of breath, no palpitations, no leg swelling Respiratory: no cough, no shortness of breath Gastrointestinal: no nausea/vomiting, + diarrhea Musculoskeletal: no muscle/joint aches Skin: no rashes, no hyperemia Neurological: + tremors- on medication for this, no numbness, no tingling, no dizziness Psychiatric: no depression, + intermittent anxiety and irritability   Objective:    BP 120/73    Pulse 77    Ht 5' 2.75" (1.594 m)    Wt 176 lb 6.4 oz (80 kg)    SpO2 93%    BMI 31.50 kg/m   Wt Readings from Last 3 Encounters:  05/30/21 176 lb 6.4 oz (80 kg)  05/09/21  173 lb (78.5 kg)  04/29/21 179 lb (81.2 kg)     BP Readings from Last 3 Encounters:  05/30/21 120/73  05/09/21 137/86  04/29/21 (!) 149/82                          Physical Exam- Limited  Constitutional:  Body mass index is 31.5 kg/m. , not in acute distress, normal state of mind Eyes:  EOMI, no exophthalmos Neck: Supple Thyroid: No gross goiter, no palpable nodularity Cardiovascular: RRR, no murmurs, rubs, or gallops, no edema Respiratory: Adequate breathing efforts, no crackles, rales, rhonchi, + expiratory wheezing right lower base Musculoskeletal: no gross deformities, strength intact in all four extremities, no gross restriction of joint movements Skin:  no rashes, no hyperemia Neurological: ++ tremor with outstretched hands R>L   CMP     Component Value Date/Time   NA 137 05/08/2020 1411   NA 139 10/29/2016 1553   K 4.0 05/08/2020 1411   CL 101 05/08/2020 1411   CO2 25 05/08/2020 1411   GLUCOSE 101 (H) 05/08/2020 1411   BUN 17 05/08/2020 1411   BUN 13 10/29/2016 1553   CREATININE 1.20 (H) 05/08/2020 1411   CREATININE 1.36 (H) 06/11/2015 1211   CALCIUM 8.9 05/08/2020 1411   PROT 6.0 (L) 05/08/2020 1411   PROT 6.0 10/29/2016 1553   ALBUMIN 3.6 05/08/2020 1411   ALBUMIN 3.9 10/29/2016 1553   AST 16 05/08/2020 1411   ALT 12 05/08/2020 1411   ALKPHOS 61 05/08/2020 1411   BILITOT 0.8  05/08/2020 1411   BILITOT 0.4 10/29/2016 1553   GFRNONAA 46 (L) 05/08/2020 1411   GFRAA 52 (L) 06/18/2019 2205     CBC    Component Value Date/Time   WBC 6.3 05/08/2020 1411   RBC 4.90 05/08/2020 1411   HGB 15.2 (H) 05/08/2020 1411   HCT 45.6 05/08/2020 1411   PLT 256 05/08/2020 1411   MCV 93.1 05/08/2020 1411   MCV 87.9 11/01/2016 1103   MCH 31.0 05/08/2020 1411   MCHC 33.3 05/08/2020 1411   RDW 12.9 05/08/2020 1411   LYMPHSABS 1.5 09/17/2017 2004   MONOABS 0.8 09/17/2017 2004   EOSABS 0.2 09/17/2017 2004   BASOSABS 0.0 09/17/2017 2004     Diabetic Labs (most recent): No results found for: HGBA1C  Lipid Panel     Component Value Date/Time   CHOL 139 10/29/2016 1553   TRIG 141 10/29/2016 1553   HDL 43 10/29/2016 1553   CHOLHDL 3.2 10/29/2016 1553   LDLCALC 68 10/29/2016 1553   LABVLDL 28 10/29/2016 1553     Lab Results  Component Value Date   TSH 0.39 (A) 05/05/2021        Assessment & Plan:   1. Abnormal TSH  she is being seen at a kind request of Lubertha Sayres, [place].  her history and most recent labs are reviewed, and she was examined clinically. 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 The potential risks of untreated thyrotoxicosis and the need for definitive therapy have been discussed in detail with her, and she agrees to proceed with diagnostic workup and treatment plan.   I will repeat full profile thyroid function tests today, including antibody testing to test for autoimmune thyroid dysfunction.  Will hold off on uptake and scan given her marginally suppressed TSH and unremarkable physical exam.    I did  however decide to order thyroid ultrasound given her vague complaint of intermittent dysphagia and hoarseness.   she will return in 2 weeks to discuss test results and to develop for treatment  plan.      -Patient is advised to maintain close follow up with Lubertha Sayres, FNP for primary care needs.   - Time spent with the patient: 60 minutes, of which >50% was spent in obtaining information about her symptoms, reviewing her previous labs, evaluations, and treatments, counseling her about her hyperthyroidism , and developing a plan to confirm the diagnosis and long term treatment as necessary. Please refer to "Patient Self Inventory" in the Media tab for reviewed elements of pertinent patient history.  Jack Quarto participated in the discussions, expressed understanding, and voiced agreement with the above plans.  All questions were answered to her satisfaction. she is encouraged to contact clinic should she have any questions or concerns prior to her return visit.   Follow up plan: Return in about 2 weeks (around 06/13/2021) for Thyroid follow up, Previsit labs.   Thank you for involving me in the care of this pleasant patient, and I will continue to update you with her progress.    Rayetta Pigg, [facility] [address] [place],  [address] Phone: [phone] Fax: [phone]  05/30/2021, 3:16 PM

## 2021-06-01 LAB — TSH: TSH: 0.308 u[IU]/mL — ABNORMAL LOW (ref 0.450–4.500)

## 2021-06-01 LAB — THYROGLOBULIN ANTIBODY: Thyroglobulin Antibody: <1 IU/mL (ref 0.0–0.9)

## 2021-06-01 LAB — T3, FREE: T3, Free: 3.3 pg/mL (ref 2.0–4.4)

## 2021-06-01 LAB — T4, FREE: Free T4: 1.65 ng/dL (ref 0.82–1.77)

## 2021-06-01 LAB — THYROID PEROXIDASE ANTIBODY: Thyroperoxidase Ab SerPl-aCnc: <9 IU/mL (ref 0–34)

## 2021-06-08 ENCOUNTER — Ambulatory Visit (HOSPITAL_COMMUNITY)
Admission: RE | Admit: 2021-06-08 | Discharge: 2021-06-08 | Disposition: A | Discharge status: Home / Self Care | Payer: Medicare Other | Source: Ambulatory Visit | Attending: Nurse Practitioner | Admitting: Nurse Practitioner

## 2021-06-08 ENCOUNTER — Other Ambulatory Visit: Payer: Self-pay

## 2021-06-08 DIAGNOSIS — R7989 Other specified abnormal findings of blood chemistry: Secondary | ICD-10-CM | POA: Diagnosis present

## 2021-06-08 DIAGNOSIS — E039 Hypothyroidism, unspecified: Secondary | ICD-10-CM | POA: Diagnosis present

## 2021-06-13 ENCOUNTER — Other Ambulatory Visit: Payer: Self-pay

## 2021-06-13 ENCOUNTER — Ambulatory Visit (INDEPENDENT_AMBULATORY_CARE_PROVIDER_SITE_OTHER): Payer: Medicare Other | Admitting: Nurse Practitioner

## 2021-06-13 ENCOUNTER — Encounter: Payer: Self-pay | Admitting: Nurse Practitioner

## 2021-06-13 VITALS — BP 127/86 | HR 82 | Ht 62.75 in | Wt 173.2 lb

## 2021-06-13 DIAGNOSIS — R7989 Other specified abnormal findings of blood chemistry: Secondary | ICD-10-CM

## 2021-06-13 DIAGNOSIS — E041 Nontoxic single thyroid nodule: Secondary | ICD-10-CM

## 2021-06-13 NOTE — Patient Instructions (Addendum)
Thyroid Needle Biopsy ?Thyroid needle biopsy is a procedure to remove small samples of tissue or fluid from the thyroid gland. The samples are then examined under a microscope. The thyroid is a gland in the lower front area of the neck. It produces hormones that affect many important body processes, including growth and development, body temperature, and how the body uses food for energy (metabolism). This procedure is often done to help diagnose cancer, infection, or other problems with the thyroid. ?During this procedure, a thin needle (fine needle) is inserted through the skin and into the thyroid gland. This is less invasive than a procedure in which an incision is made over the thyroid (open thyroid biopsy). Sometimes, an open thyroid biopsy may be done during a different surgery, such as surgery to remove a part or a whole section (lobe) of the thyroid gland (open lobectomy). ?Tell a health care provider about: ?Any allergies you have. ?All medicines you are taking, including vitamins, herbs, eye drops, creams, and over-the-counter medicines. ?Any problems you or family members have had with anesthetic medicines. ?Any blood disorders you have. ?Any surgeries you have had. ?Any medical conditions you have. ?Whether you are pregnant or may be pregnant. ?What are the risks? ?Generally, this is a safe procedure. However, problems may occur, including: ?Infection. ?Bleeding. ?Allergic reactions to medicines. ?Damage to nerves or blood vessels in the neck. ?What happens before the procedure? ?Medicines ?Ask your health care provider about: ?Changing or stopping your regular medicines. This is especially important if you are taking diabetes medicines or blood thinners. ?Taking medicines such as aspirin and ibuprofen. These medicines can thin your blood. Do not take these medicines unless your health care provider tells you to take them. ?Taking over-the-counter medicines, vitamins, herbs, and supplements. ?General  instructions ?You may have blood tests. ?You may have an ultrasound before or during the needle biopsy. ?What happens during the procedure? ?You will be asked to lie on your back with your head tipped backward to extend your neck. You may be asked to avoid coughing, talking, swallowing, or making sounds during some parts of the procedure. ?To lower your risk of infection: ?Your health care team will wash or sanitize their hands. ?The skin over your thyroid will be cleaned with a germ-killing (antiseptic) solution. ?A local anesthetic (lidocaine) may be injected into the skin over your thyroid, to numb the area. ?An ultrasound may be done to help guide the needle to the desired area of your thyroid. ?A fine needle will be inserted into your thyroid. The needle will be used to remove tissue or fluid samples as needed. The samples will be sent to a lab for examination. ?The needle will be removed. ?Pressure may be applied to your neck to reduce swelling and stop bleeding. ?The procedure may vary among health care providers and hospitals. ?What happens after the procedure? ?It is up to you to get the results of your procedure. Ask your health care provider, or the department that is doing the procedure, when your results will be ready. ?Summary ?Thyroid needle biopsy is a procedure to remove small samples of tissue or fluid from the thyroid gland. ?During this procedure, a thin needle (fine needle) is inserted through the skin and into the thyroid gland. This is less invasive than a procedure in which an incision is made over the thyroid (open thyroid biopsy). ?You will be asked to lie on your back with your head tipped backward to extend your neck. You may be asked  to avoid coughing, talking, swallowing, or making sounds during some parts of the procedure. ?This information is not intended to replace advice given to you by your health care provider. Make sure you discuss any questions you have with your health care  provider. ?Document Revised: 12/03/2019 Document Reviewed: 12/03/2019 ?Elsevier Patient Education ? [place]. ? ?

## 2021-06-13 NOTE — Progress Notes (Signed)
06/13/2021     Endocrinology Consult Note    Subjective:    Patient ID: [name], female    DOB: 1940-11-19, PCP Lubertha Sayres, [place].   Past Medical History:  Diagnosis Date   Anemia 1990   hx of   Anxiety    Arthritis 2004   Asthma 1995   uses inhaler   Chronic kidney disease 2010   LEFT kidney removed   Colon polyp    Complication of anesthesia 1966   first surgery she had- had a hard time waking up from surgery   Diverticulosis 2003   GERD (gastroesophageal reflux disease)    on meds    Past Surgical History:  Procedure Laterality Date   APPENDECTOMY  1966   CATARACT EXTRACTION     Bilateral   CHOLECYSTECTOMY  2004   COLON SURGERY  2008   COLONOSCOPY  2013   JP-MAC-moviprep(good)-TA   COLOSTOMY     COLOSTOMY TAKEDOWN     DIAGNOSTIC LAPAROSCOPY     exploratory for endometriosis   HEEL SPUR SURGERY     Right, Metal Plate in [place]  2010   NEPHRECTOMY Left 9935   As complication of partial colectomy for diverticulitis   POLYPECTOMY  2013   TA   TONSILLECTOMY     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   Procedure: LEFT TOTAL KNEE ARTHROPLASTY;  Surgeon: Susa Day, MD;  Location: [facility];  Service: Orthopedics;  Laterality: Left;  Adductor Block    Social History   Socioeconomic History   Marital status: Legally Separated    Spouse name: Not on file   Number of children: 10   Years of education: Not on file   Highest education level: Not on file  Occupational History   Occupation: retired  Tobacco Use   Smoking status: Never   Smokeless tobacco: Never  Vaping Use   Vaping Use: Never used  Substance and Sexual Activity   Alcohol use: Not Currently    Comment: occassionally   Drug use: No   Sexual activity: Not Currently  Other Topics Concern   Not on file  Social History Narrative   Not on file   Social Determinants of Health   Financial Resource Strain: Not on file  Food Insecurity: Not on  file  Transportation Needs: Not on file  Physical Activity: Not on file  Stress: Not on file  Social Connections: Not on file    Family History  Problem Relation Age of Onset   Hypertension Mother    Heart failure Mother    Hyperlipidemia Mother    Stroke Mother    Hypertension Father    Heart attack Father    COPD Father    Diabetes Paternal Aunt    Diabetes Paternal Grandmother    Colon polyps Other        niece   Stomach cancer Neg Hx    Rectal cancer Neg Hx    Esophageal cancer Neg Hx    Colon cancer Neg Hx     Outpatient Encounter Medications as of 06/13/2021  Medication Sig   acetaminophen (TYLENOL) 325 MG tablet Take 650 mg by mouth every 6 (six) hours as needed.   amantadine (SYMMETREL) 100 MG capsule Take 100 mg by mouth 2 (two) times daily.   BREO ELLIPTA 200-25 MCG/INH AEPB Inhale 1 puff into the lungs daily.   cetirizine (ZYRTEC) 10 MG tablet Take 10 mg by mouth daily.  escitalopram (LEXAPRO) 10 MG tablet Take 10 mg by mouth daily.   fluticasone (FLONASE) 50 MCG/ACT nasal spray Place into both nostrils.   gabapentin (NEURONTIN) 100 MG capsule Take 100 mg by mouth at bedtime.   ibuprofen (ADVIL,MOTRIN) 800 MG tablet Take 1 tablet (800 mg total) by mouth every 8 (eight) hours as needed (pain).   loperamide (IMODIUM) 2 MG capsule Take by mouth.   melatonin 3 MG TABS tablet Take 3 mg by mouth at bedtime.   omeprazole (PRILOSEC) 20 MG capsule Take 1 capsule (20 mg total) by mouth daily.   risperiDONE (RISPERDAL) 2 MG tablet Take 2 mg by mouth 2 (two) times daily.   traZODone (DESYREL) 150 MG tablet Take 150 mg by mouth at bedtime.   vitamin B-12 (CYANOCOBALAMIN) 1000 MCG tablet Take 1,000 mcg by mouth daily.   albuterol (PROVENTIL HFA;VENTOLIN HFA) 108 (90 Base) MCG/ACT inhaler Inhale 2 puffs into the lungs every 6 (six) hours as needed for wheezing or shortness of breath. (Patient not taking: Reported on 05/30/2021)   benztropine (COGENTIN) 0.5 MG tablet Take 0.5 mg  by mouth 2 (two) times daily. (Patient not taking: Reported on 06/13/2021)   No facility-administered encounter medications on file as of 06/13/2021.    ALLERGIES: Allergies  Allergen Reactions   Haloperidol Anaphylaxis   Aripiprazole Other (See Comments)    Bad thoughts    Chlorpheniramine-Pse-Ibuprofen     Other reaction(s): Cough    VACCINATION STATUS: Immunization History  Administered Date(s) Administered   Influenza,inj,Quad PF,6+ Mos 01/12/2017   Influenza-Unspecified 01/06/2016   PPD Test 04/08/2016   Pneumococcal Conjugate-13 04/09/2014   Pneumococcal Polysaccharide-23 04/09/2009   Tdap 04/09/2013     HPI  [name] is 81 y.o. female who presents today with a medical history as above. she is being seen in consultation for hyperthyroidism requested by Lubertha Sayres, [place].  she has been dealing with symptoms of diarrhea, unintentional weight loss, raspy voice, tremors, and intermittent anxiety/irritability for 6-8 months. These symptoms are progressively worsening and troubling to her.  her most recent thyroid labs revealed marginally suppressed TSH of 0.39 on 05/05/21.  she denies choking, shortness of breath but does endorse recent voice change and intermittent dysphagia.   she denies family history of thyroid dysfunction and denies family hx of thyroid cancer. she denies personal history of goiter. she is not on any anti-thyroid medications nor on any thyroid hormone supplements (but she does report she was on Levothyroxine many years ago- since discontinued).  Denies use of Biotin containing supplements.  she is willing to proceed with appropriate work up and therapy for thyrotoxicosis.   Review of systems  Constitutional: + Minimally fluctuating body weight, current Body mass index is 30.93 kg/m., no fatigue, no subjective hyperthermia, no subjective hypothermia Eyes: no blurry vision, no xerophthalmia ENT: no sore throat, no nodules palpated in throat, +  dysphagia, no odynophagia, + hoarseness Cardiovascular: no chest pain, no shortness of breath, no palpitations, no leg swelling Respiratory: no cough, no shortness of breath Gastrointestinal: no nausea/vomiting, + diarrhea Musculoskeletal: no muscle/joint aches Skin: no rashes, no hyperemia Neurological: + tremors- on medication for this, no numbness, no tingling, no dizziness Psychiatric: no depression, + intermittent anxiety and irritability   Objective:    BP 127/86    Pulse 82    Ht 5' 2.75" (1.594 m)    Wt 173 lb 3.2 oz (78.6 kg)    SpO2 93%    BMI 30.93 kg/m   Wt Readings  from Last 3 Encounters:  06/13/21 173 lb 3.2 oz (78.6 kg)  05/30/21 176 lb 6.4 oz (80 kg)  05/09/21 173 lb (78.5 kg)     BP Readings from Last 3 Encounters:  06/13/21 127/86  05/30/21 120/73  05/09/21 137/86                          Physical Exam- Limited  Constitutional:  Body mass index is 30.93 kg/m. , not in acute distress, normal state of mind Eyes:  EOMI, no exophthalmos Neck: Supple Thyroid: No gross goiter, no palpable nodularity Cardiovascular: RRR, no murmurs, rubs, or gallops, no edema Respiratory: Adequate breathing efforts, no crackles, rales, rhonchi, + expiratory wheezing right lower base Musculoskeletal: no gross deformities, strength intact in all four extremities, no gross restriction of joint movements Skin:  no rashes, no hyperemia Neurological: ++ tremor with outstretched hands R>L   CMP     Component Value Date/Time   NA 137 05/08/2020 1411   NA 139 10/29/2016 1553   K 4.0 05/08/2020 1411   CL 101 05/08/2020 1411   CO2 25 05/08/2020 1411   GLUCOSE 101 (H) 05/08/2020 1411   BUN 17 05/08/2020 1411   BUN 13 10/29/2016 1553   CREATININE 1.20 (H) 05/08/2020 1411   CREATININE 1.36 (H) 06/11/2015 1211   CALCIUM 8.9 05/08/2020 1411   PROT 6.0 (L) 05/08/2020 1411   PROT 6.0 10/29/2016 1553   ALBUMIN 3.6 05/08/2020 1411   ALBUMIN 3.9 10/29/2016 1553   AST 16 05/08/2020  1411   ALT 12 05/08/2020 1411   ALKPHOS 61 05/08/2020 1411   BILITOT 0.8 05/08/2020 1411   BILITOT 0.4 10/29/2016 1553   GFRNONAA 46 (L) 05/08/2020 1411   GFRAA 52 (L) 06/18/2019 2205     CBC    Component Value Date/Time   WBC 6.3 05/08/2020 1411   RBC 4.90 05/08/2020 1411   HGB 15.2 (H) 05/08/2020 1411   HCT 45.6 05/08/2020 1411   PLT 256 05/08/2020 1411   MCV 93.1 05/08/2020 1411   MCV 87.9 11/01/2016 1103   MCH 31.0 05/08/2020 1411   MCHC 33.3 05/08/2020 1411   RDW 12.9 05/08/2020 1411   LYMPHSABS 1.5 09/17/2017 2004   MONOABS 0.8 09/17/2017 2004   EOSABS 0.2 09/17/2017 2004   BASOSABS 0.0 09/17/2017 2004     Diabetic Labs (most recent): No results found for: HGBA1C  Lipid Panel     Component Value Date/Time   CHOL 139 10/29/2016 1553   TRIG 141 10/29/2016 1553   HDL 43 10/29/2016 1553   CHOLHDL 3.2 10/29/2016 1553   LDLCALC 68 10/29/2016 1553   LABVLDL 28 10/29/2016 1553     Lab Results  Component Value Date   TSH 0.308 (L) 05/31/2021   TSH 0.39 (A) 05/05/2021   FREET4 1.65 05/31/2021     Latest Reference Range & Units 05/05/21 00:00 05/31/21 14:51  TSH 0.450 - 4.500 uIU/mL 0.39 ! (E) 0.308 (L)  Triiodothyronine,Free,Serum 2.0 - 4.4 pg/mL  3.3  T4,Free(Direct) 0.82 - 1.77 ng/dL  1.65  Thyroperoxidase Ab SerPl-aCnc 0 - 34 IU/mL  <9  Thyroglobulin Antibody 0.0 - 0.9 IU/mL  <1.0  !: Data is abnormal (L): Data is abnormally low (E): External lab result  Thyroid US from 06/08/21 CLINICAL DATA:  Other. dysphagia and hoarseness, suppressed TSH- personal history of hypothyroidism   EXAM: THYROID ULTRASOUND   TECHNIQUE: Ultrasound examination of the thyroid gland and adjacent soft tissues was performed.  COMPARISON:  None.   FINDINGS: Parenchymal Echotexture: Moderately heterogenous   Isthmus: 0.1 cm   Right lobe: 5.0 x 3.0 x 3.7 cm   Left lobe: 2.9 x 2.2 x 0.9 cm   _________________________________________________________   Estimated  total number of nodules >/= 1 cm: 3   Number of spongiform nodules >/=  2 cm not described below (TR1): 0   Number of mixed cystic and solid nodules >/= 1.5 cm not described below (TR2): 0   _________________________________________________________   Nodule labeled 1 is a solid isoechoic nodule (TR 3) in the superior right thyroid lobe measuring 2.0 x 1.7 x 1.7 cm. *Given size (>/= 1.5 - 2.4 cm) and appearance, a follow-up ultrasound in 1 year should be considered based on TI-RADS criteria.   Nodule labeled 2 is a solid isoechoic nodule (TR 3) in the mid to inferior right thyroid lobe measuring 4.0 x 3.3 x 2.4 cm. **Given size (>/= 2.5 cm) and appearance, fine needle aspiration of this mildly suspicious nodule should be considered based on TI-RADS criteria.   Nodule labeled 3 is a mixed solid and cystic isoechoic nodule (TR 2) in the inferior left thyroid lobe which measures 2.8 x 2.6 x 1.8 cm. This nodule does NOT meet TI-RADS criteria for biopsy or dedicated follow-up.   IMPRESSION: 1. Atrophic appearance of the normal thyroid tissue which has largely been replaced by several large nodules as described, with asymmetric enlargement of the right side. 2. Nodule labeled 2 meets criteria for biopsy. 3. Nodule labeled 1 meets criteria for follow-up ultrasound in 1 year.   The above is in keeping with the ACR TI-RADS recommendations - J Am Coll Radiol 2017;14:587-595.     Electronically Signed   By: Albin Felling M.D.   On: 06/08/2021 15:15  Assessment & Plan:   1. Subclinical hyperthyroidism 2. Multinodular goiter  she is being seen at a kind request of Lubertha Sayres, [place].  Her repeat thyroid function tests still show mildly suppressed TSH but normal Free T3 and Free T4 values.  Her antibody testing was negative, ruling out autoimmune thyroid dysfunction.  She does not require any antithyroid treatment at this time.  Will monitor TFTs on subsequent visits for  surveillance.  Her thyroid ultrasound shows asymmetric thyroid R>L with several sizeable nodules.  She is noted to have a 4 cm nodule in the right mid/lower gland which recommended FNA given its characteristics.  There was one more in the right upper lobe recommending surveillance ultrasound in 1 year.  We discussed the benefits and risks of FNA (with her and her guardian/niece Heather) and discussed that if the nodule is found to be cancerous, then surgery will be needed.  We all agreed to proceed with FNA.    Follow Up Plan: Return in about 1 month (around 07/14/2021) for Thyroid follow up; FNA biopsy.     -Patient is advised to maintain close follow up with Lubertha Sayres, FNP for primary care needs.    I spent 30 minutes in the care of the patient today including review of labs from Thyroid Function, CMP, and other relevant labs ; imaging/biopsy records (current and previous including abstractions from other facilities); face-to-face time discussing  her lab results and symptoms, medications doses, her options of short and long term treatment based on the latest standards of care / guidelines;   and documenting the encounter.  [name]  participated in the discussions, expressed understanding, and voiced agreement with the above plans.  All questions were answered to her satisfaction. she is encouraged to contact clinic should she have any questions or concerns prior to her return visit.  Follow up plan: Return in about 1 month (around 07/14/2021) for Thyroid follow up; FNA biopsy.   Thank you for involving me in the care of this pleasant patient, and I will continue to update you with her progress.    Rayetta Pigg, [facility] [address] [place], [place] [address] Phone: [phone] Fax: [phone]  06/13/2021, 4:00 PM

## 2021-06-22 ENCOUNTER — Encounter (HOSPITAL_COMMUNITY): Payer: Self-pay

## 2021-06-22 ENCOUNTER — Ambulatory Visit (HOSPITAL_COMMUNITY)
Admission: RE | Admit: 2021-06-22 | Discharge: 2021-06-22 | Disposition: A | Discharge status: Home / Self Care | Payer: Medicare Other | Source: Ambulatory Visit | Attending: Nurse Practitioner | Admitting: Nurse Practitioner

## 2021-06-22 ENCOUNTER — Other Ambulatory Visit: Payer: Self-pay

## 2021-06-22 DIAGNOSIS — E041 Nontoxic single thyroid nodule: Secondary | ICD-10-CM | POA: Diagnosis not present

## 2021-06-22 MED ORDER — LIDOCAINE HCL (PF) 2 % IJ SOLN
INTRAMUSCULAR | Status: AC
  Administered 2021-06-22: 10 mL
  Filled 2021-06-22: qty 10

## 2021-06-22 NOTE — Progress Notes (Signed)
PT tolerated thyroid biopsy procedure well today. Labs and afirma obtained and sent for pathology. PT ambulatory at discharge with no acute distress noted and verbalized understanding of discharge instructions. Informed consent verified via telephone with legal guardian [name] today.  ?

## 2021-06-23 LAB — CYTOLOGY - NON PAP

## 2021-07-10 ENCOUNTER — Encounter (HOSPITAL_COMMUNITY): Payer: Self-pay

## 2021-07-18 ENCOUNTER — Encounter: Payer: Self-pay | Admitting: Nurse Practitioner

## 2021-07-18 ENCOUNTER — Ambulatory Visit (INDEPENDENT_AMBULATORY_CARE_PROVIDER_SITE_OTHER): Payer: Medicare Other | Admitting: Nurse Practitioner

## 2021-07-18 VITALS — BP 114/70 | HR 99 | Ht 62.75 in | Wt 181.2 lb

## 2021-07-18 DIAGNOSIS — E041 Nontoxic single thyroid nodule: Secondary | ICD-10-CM

## 2021-07-18 DIAGNOSIS — E0590 Thyrotoxicosis, unspecified without thyrotoxic crisis or storm: Secondary | ICD-10-CM

## 2021-07-18 NOTE — Patient Instructions (Signed)
Thyroidectomy ?A thyroidectomy is a surgery that is done to remove the thyroid gland. The thyroid is a butterfly-shaped gland that is located at the lower front of your neck. It produces thyroid hormone, which is a substance that helps to control certain body processes. You may have a: ?Total thyroidectomy. All of your thyroid is removed. ?Thyroid lobectomy. Part of your thyroid is removed. ?The amount of thyroid gland tissue that is removed during your surgery depends on the reason for the procedure. Reasons to have this procedure include treatment for: ?Thyroid nodules. ?Thyroid cancer. ?Benign thyroid tumors. ?Goiter. ?Overactive thyroid gland (hyperthyroidism). ?There are two ways to do this procedure. Conventional, or open, thyroidectomy uses one large incision to remove the thyroid gland. This is the most common method. Endoscopic thyroidectomy, a less invasive method, uses a narrow tube with a light and camera (endoscope) to remove the gland. ?Tell a health care provider about: ?Any allergies you have. ?All medicines you are taking, including vitamins, herbs, eye drops, creams, and over-the-counter medicines. ?Any problems you or family members have had with anesthetic medicines. ?Any blood disorders you have. ?Any surgeries you have had. ?Any medical conditions you have. ?Whether you are pregnant or may be pregnant. ?What are the risks? ?Generally, this is a safe procedure. However, problems may occur, including: ?Damage to the parathyroid glands. These are located behind your thyroid gland. They maintain the calcium levels in the body. Damage may lead to: ?A decrease in parathyroid hormone levels (hypoparathyroidism). ?A decrease in calcium levels. This will make your nerves irritable and may cause muscle spasms. ?An increase in thyroid hormone. ?Damage to the nerves of your voice box (larynx). This can be temporary or long-term (rare). ?Hoarseness. This usually resolves in 24-48  hours. ?Bleeding. ?Infection. ?What happens before the procedure? ?Staying hydrated ?Follow instructions from your health care provider about hydration, which may include: ?Up to 2 hours before the procedure - you may continue to drink clear liquids, such as water, clear fruit juice, black coffee, and plain tea. ?Eating and drinking restrictions ?Follow instructions from your health care provider about eating and drinking, which may include: ?8 hours before the procedure - stop eating heavy meals or foods such as meat, fried foods, or fatty foods. ?6 hours before the procedure - stop eating light meals or foods, such as toast or cereal. ?6 hours before the procedure - stop drinking milk or drinks that contain milk. ?2 hours before the procedure - stop drinking clear liquids. ?Medicines ?Ask your health care provider about: ?Changing or stopping your regular medicines. This is especially important if you are taking diabetes medicines or blood thinners. ?Taking medicines such as aspirin and ibuprofen. These medicines can thin your blood. Do not take these medicines unless your health care provider tells you to take them. ?Taking over-the-counter medicines, vitamins, herbs, and supplements. ?General instructions ?You may be asked to shower with a germ-killing soap. ?Plan to have someone take you home from the hospital or clinic. ?Plan to have a responsible adult care for you for at least 24 hours after you leave the hospital or clinic. This is important. ?What happens during the procedure? ?To reduce your risk of infection: ?Your health care team will wash or sanitize their hands. ?Hair may be removed from the surgical area. ?Your skin will be washed with soap. ?An IV will be inserted into one of your veins. ?You will be given one or more of the following: ?A medicine to help you relax (sedative). ?A medicine  to make you fall asleep (general anesthetic). ?Your health care provider will perform your surgery using one of  two methods: ?For open thyroidectomy, an incision will be made in your lower neck. Muscles in the area will be separated to reveal your thyroid gland. ?For endoscopic thyroidectomy, several small incisions will be made in your neck, chest, or armpit. An endoscopewill be inserted into an incision. ?Your health care provider may monitor laryngeal nerve function during the procedure for safety reasons. ?Part or all of your thyroid gland will be removed. ?A tube (drain) may be placed at the incision site to drain blood and fluids that accumulate under the skin after the procedure. The drain may have to stay in place for a day or two after the procedure. ?The incision will be closed with stitches (sutures). ?A dressing will be placed over your incision. ?The procedure may vary among health care providers and hospitals. ?What happens after the procedure? ?Your blood pressure, heart rate, breathing rate, and blood oxygen level will be monitored often until the medicines you were given have worn off. ?You will be given pain medicine as needed. ?Your provider will check your ability to talk and swallow after the procedure. ?You will gradually start to drink liquids and have soft foods as tolerated. ?You may have a blood test to check the level of calcium in your body. ?If you had a drain put in during the procedure, it will usually be removed the next day. ?Summary ?A thyroidectomy is a surgery that is done to remove the thyroid gland. ?The procedure will be done in one of two ways: conventional, or open, thyroidectomy or endoscopic thyroidectomy. ?Serious complications are rare. ?Plan to have a responsible adult care for you for at least 24 hours after you leave the hospital or clinic. This is important. ?This information is not intended to replace advice given to you by your health care provider. Make sure you discuss any questions you have with your health care provider. ?Document Revised: 12/03/2019 Document Reviewed:  12/03/2019 ?Elsevier Patient Education ? [place]. ? ?

## 2021-07-18 NOTE — Progress Notes (Signed)
? ? ? 07/18/2021   ? ? ?Endocrinology Follow Up Note  ? ? ?Subjective:  ? ? Patient ID: [name], female    DOB: 11-15-1940, PCP Lubertha Sayres, [place]. ? ? ?Past Medical History:  ?Diagnosis Date  ? Anemia 1990  ? hx of  ? Anxiety   ? Arthritis 2004  ? Asthma 1995  ? uses inhaler  ? Chronic kidney disease 2010  ? LEFT kidney removed  ? Colon polyp   ? Complication of anesthesia 1966  ? first surgery she had- had a hard time waking up from surgery  ? Diverticulosis 2003  ? GERD (gastroesophageal reflux disease)   ? on meds  ? ? ?Past Surgical History:  ?Procedure Laterality Date  ? APPENDECTOMY  1966  ? CATARACT EXTRACTION    ? Bilateral  ? CHOLECYSTECTOMY  2004  ? COLON SURGERY  2008  ? COLONOSCOPY  2013  ? JP-MAC-moviprep(good)-TA  ? COLOSTOMY    ? COLOSTOMY TAKEDOWN    ? DIAGNOSTIC LAPAROSCOPY    ? exploratory for endometriosis  ? HEEL SPUR SURGERY    ? Right, Metal Plate in [place]  ? [place] REPAIR  2010  ? NEPHRECTOMY Left 2008  ? As complication of partial colectomy for diverticulitis  ? POLYPECTOMY  2013  ? TA  ? TONSILLECTOMY    ? 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  ? Procedure: LEFT TOTAL KNEE ARTHROPLASTY;  Surgeon: Susa Day, MD;  Location: [facility];  Service: Orthopedics;  Laterality: Left;  Adductor Block  ? ? ?Social History  ? ?Socioeconomic History  ? Marital status: Legally Separated  ?  Spouse name: Not on file  ? Number of children: 10  ? Years of education: Not on file  ? Highest education level: Not on file  ?Occupational History  ? Occupation: retired  ?Tobacco Use  ? Smoking status: Never  ? Smokeless tobacco: Never  ?Vaping Use  ? Vaping Use: Never used  ?Substance and Sexual Activity  ? Alcohol use: Not Currently  ?  Comment: occassionally  ? Drug use: No  ? Sexual activity: Not Currently  ?Other Topics Concern  ? Not on file  ?Social History Narrative  ? Not on file  ? ?Social Determinants of Health  ? ?Financial Resource Strain: Not on file  ?Food Insecurity: Not on  file  ?Transportation Needs: Not on file  ?Physical Activity: Not on file  ?Stress: Not on file  ?Social Connections: Not on file  ? ? ?Family History  ?Problem Relation Age of Onset  ? Hypertension Mother   ? Heart failure Mother   ? Hyperlipidemia Mother   ? Stroke Mother   ? Hypertension Father   ? Heart attack Father   ? COPD Father   ? Diabetes Paternal Aunt   ? Diabetes Paternal Grandmother   ? Colon polyps Other   ?     niece  ? Stomach cancer Neg Hx   ? Rectal cancer Neg Hx   ? Esophageal cancer Neg Hx   ? Colon cancer Neg Hx   ? ? ?Outpatient Encounter Medications as of 07/18/2021  ?Medication Sig  ? acetaminophen (TYLENOL) 325 MG tablet Take 650 mg by mouth every 6 (six) hours as needed.  ? amantadine (SYMMETREL) 100 MG capsule Take 100 mg by mouth 2 (two) times daily.  ? amoxicillin (AMOXIL) 250 MG capsule Take 250 mg by mouth 2 (two) times daily.  ? BREO ELLIPTA 200-25 MCG/INH AEPB Inhale 1 puff into the  lungs daily.  ? cetirizine (ZYRTEC) 10 MG tablet Take 10 mg by mouth daily.  ? COMBIVENT RESPIMAT 20-100 MCG/ACT AERS respimat Inhale 1 puff into the lungs 4 (four) times daily.  ? escitalopram (LEXAPRO) 10 MG tablet Take 10 mg by mouth daily.  ? fluticasone (FLONASE) 50 MCG/ACT nasal spray Place into both nostrils.  ? gabapentin (NEURONTIN) 100 MG capsule Take 100 mg by mouth at bedtime.  ? ibuprofen (ADVIL,MOTRIN) 800 MG tablet Take 1 tablet (800 mg total) by mouth every 8 (eight) hours as needed (pain).  ? loperamide (IMODIUM) 2 MG capsule Take by mouth.  ? melatonin 3 MG TABS tablet Take 3 mg by mouth at bedtime.  ? omeprazole (PRILOSEC) 20 MG capsule Take 1 capsule (20 mg total) by mouth daily.  ? risperiDONE (RISPERDAL) 2 MG tablet Take 2 mg by mouth 2 (two) times daily.  ? traZODone (DESYREL) 150 MG tablet Take 150 mg by mouth at bedtime.  ? vitamin B-12 (CYANOCOBALAMIN) 1000 MCG tablet Take 1,000 mcg by mouth daily.  ? albuterol (PROVENTIL HFA;VENTOLIN HFA) 108 (90 Base) MCG/ACT inhaler Inhale 2  puffs into the lungs every 6 (six) hours as needed for wheezing or shortness of breath. (Patient not taking: Reported on 07/18/2021)  ? benztropine (COGENTIN) 0.5 MG tablet Take 0.5 mg by mouth 2 (two) times daily. (Patient not taking: Reported on 06/13/2021)  ? ?No facility-administered encounter medications on file as of 07/18/2021.  ? ? ?ALLERGIES: ?Allergies  ?Allergen Reactions  ? Haloperidol Anaphylaxis  ? Aripiprazole Other (See Comments)  ?  Bad thoughts   ? Chlorpheniramine-Pse-Ibuprofen   ?  Other reaction(s): Cough  ? ? ?VACCINATION STATUS: ?Immunization History  ?Administered Date(s) Administered  ? Influenza,inj,Quad PF,6+ Mos 01/12/2017  ? Influenza-Unspecified 01/06/2016  ? PPD Test 04/08/2016  ? Pneumococcal Conjugate-13 04/09/2014  ? Pneumococcal Polysaccharide-23 04/09/2009  ? Tdap 04/09/2013  ? ? ? ?HPI ? ?-She is accompanied today by a care attendant from the nursing home and we had the patients guardian/niece, Heather on speaker phone. ? ?[name] is 81 y.o. female who presents today with a medical history as above. she is being seen in follow up after being seen in consultation for hyperthyroidism requested by Lubertha Sayres, [place].  she has been dealing with symptoms of diarrhea, unintentional weight loss, raspy voice, tremors, and intermittent anxiety/irritability for 6-8 months. These symptoms are progressively worsening and troubling to her.  her most recent thyroid labs revealed marginally suppressed TSH of 0.39 on 05/05/21. ? ?she denies choking, shortness of breath but does endorse recent voice change and intermittent dysphagia. ?  ?she denies family history of thyroid dysfunction and denies family hx of thyroid cancer. she denies personal history of goiter. she is not on any anti-thyroid medications nor on any thyroid hormone supplements (but she does report she was on Levothyroxine many years ago- since discontinued).  Denies use of Biotin containing supplements.  she is willing to  proceed with appropriate work up and therapy for thyrotoxicosis. ? ? ?Review of systems ? ?Constitutional: + stable body weight, current Body mass index is 32.35 kg/m?., no fatigue, no subjective hyperthermia, no subjective hypothermia ?Eyes: no blurry vision, no xerophthalmia ?ENT: no sore throat, no nodules palpated in throat, + dysphagia, no odynophagia, + hoarseness ?Cardiovascular: no chest pain, no shortness of breath, no palpitations, no leg swelling ?Respiratory: + congested cough-getting over URI, no shortness of breath ?Gastrointestinal: no nausea/vomiting/diarrhea ?Musculoskeletal: no muscle/joint aches ?Skin: no rashes, no hyperemia ?Neurological: + tremors-  on medication for this, no numbness, no tingling, no dizziness ?Psychiatric: no depression, + intermittent anxiety and irritability ? ? ?Objective:  ?  ?BP 114/70   Pulse 99   Ht 5' 2.75" (1.594 m)   Wt 181 lb 3.2 oz (82.2 kg)   SpO2 91%   BMI 32.35 kg/m?   ?Wt Readings from Last 3 Encounters:  ?07/18/21 181 lb 3.2 oz (82.2 kg)  ?06/13/21 173 lb 3.2 oz (78.6 kg)  ?05/30/21 176 lb 6.4 oz (80 kg)  ?  ? ?BP Readings from Last 3 Encounters:  ?07/18/21 114/70  ?06/22/21 138/67  ?06/13/21 127/86  ? ?                    ? ?Physical Exam- Limited ? ?Constitutional:  Body mass index is 32.35 kg/m?. , not in acute distress, normal state of mind ?Eyes:  EOMI, no exophthalmos ?Neck: Supple ?Cardiovascular: RRR, no murmurs, rubs, or gallops, no edema ?Respiratory: Adequate breathing efforts, no crackles, rales, rhonchi, + congested cough ?Musculoskeletal: no gross deformities, strength intact in all four extremities, no gross restriction of joint movements ?Skin:  no rashes, no hyperemia ?Neurological: + tremor with outstretched hands R>L ? ? ?CMP  ?   ?Component Value Date/Time  ? NA 137 05/08/2020 1411  ? NA 139 10/29/2016 1553  ? K 4.0 05/08/2020 1411  ? CL 101 05/08/2020 1411  ? CO2 25 05/08/2020 1411  ? GLUCOSE 101 (H) 05/08/2020 1411  ? BUN 17  05/08/2020 1411  ? BUN 13 10/29/2016 1553  ? CREATININE 1.20 (H) 05/08/2020 1411  ? CREATININE 1.36 (H) 06/11/2015 1211  ? CALCIUM 8.9 05/08/2020 1411  ? PROT 6.0 (L) 05/08/2020 1411  ? PROT 6.0 10/29/2016 1553  ? AL

## 2021-07-19 LAB — T4, FREE: Free T4: 1.58 ng/dL (ref 0.82–1.77)

## 2021-07-19 LAB — T3, FREE: T3, Free: 2.8 pg/mL (ref 2.0–4.4)

## 2021-07-19 LAB — TSH: TSH: 1.15 u[IU]/mL (ref 0.450–4.500)

## 2021-07-19 NOTE — Progress Notes (Signed)
Please call the patients guardian/niece Heather and let her know that repeat thyroid labs have returned to normal. ?We sometimes see this happen when there is an acute inflammation of the thyroid, labs can be thrown off temporarily and then return to normal without any additional intervention. ?There is no need for any antithyroid treatment at this time. ?? ?Will plan to proceed still with the surgical consult for the compression symptoms from the thyroid nodule vs 6 month ultrasound for surveillance.

## 2021-09-06 ENCOUNTER — Encounter: Payer: Self-pay | Admitting: Cardiology

## 2021-09-06 ENCOUNTER — Ambulatory Visit (INDEPENDENT_AMBULATORY_CARE_PROVIDER_SITE_OTHER): Payer: Medicare Other | Admitting: Cardiology

## 2021-09-06 VITALS — BP 119/82 | HR 78 | Ht 62.75 in | Wt 176.0 lb

## 2021-09-06 DIAGNOSIS — I519 Heart disease, unspecified: Secondary | ICD-10-CM

## 2021-09-06 DIAGNOSIS — I340 Nonrheumatic mitral (valve) insufficiency: Secondary | ICD-10-CM

## 2021-09-06 NOTE — Progress Notes (Signed)
Cardiology CONSULT Note    Date:  09/06/2021   ID:  [name], DOB 1940/09/23, MRN [id number]  PCP:  Lubertha Sayres, FNP  Cardiologist:  Fransico Him, MD   Chief Complaint  Patient presents with   Follow-up    History of Present Illness:  [name] is a 81 y.o. female who is being seen today for the evaluation of severe LV dysfunction and mitral regurgitation at the request of Lubertha Sayres, [place].  This is a 81 year old female with a history of anxiety, asthma, anemia, CKD status post left nephrectomy and GERD who was referred for evaluation of severe LV dysfunction with EF 25% and mitral regurgitation found on 2D echo.  She has seen her PCP and a Cxray was done which showed cardiomegaly and she was referred for echo.  She has had some DOE with exertion mainly walking.  The patient denies any chest pain, PND, orthopnea, lower extremity edema, dizziness, palpitations or syncope.  Past Medical History:  Diagnosis Date   Anemia 1990   hx of   Anxiety    Arthritis 2004   Asthma 1995   uses inhaler   Chronic kidney disease 2010   LEFT kidney removed   Colon polyp    Complication of anesthesia 1966   first surgery she had- had a hard time waking up from surgery   Diverticulosis 2003   GERD (gastroesophageal reflux disease)    on meds    Past Surgical History:  Procedure Laterality Date   APPENDECTOMY  1966   CATARACT EXTRACTION     Bilateral   CHOLECYSTECTOMY  2004   COLON SURGERY  2008   COLONOSCOPY  2013   JP-MAC-moviprep(good)-TA   COLOSTOMY     COLOSTOMY TAKEDOWN     DIAGNOSTIC LAPAROSCOPY     exploratory for endometriosis   HEEL SPUR SURGERY     Right, Metal Plate in [place]  2010   NEPHRECTOMY Left 5643   As complication of partial colectomy for diverticulitis   POLYPECTOMY  2013   TA   TONSILLECTOMY     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   Procedure: LEFT TOTAL KNEE ARTHROPLASTY;  Surgeon: Susa Day, MD;   Location: [facility];  Service: Orthopedics;  Laterality: Left;  Adductor Block    Current Medications: Current Meds  Medication Sig   acetaminophen (TYLENOL) 325 MG tablet Take 650 mg by mouth every 6 (six) hours as needed.    Allergies:   Haloperidol, Aripiprazole, and Chlorpheniramine-pse-ibuprofen   Social History   Socioeconomic History   Marital status: Legally Separated    Spouse name: Not on file   Number of children: 10   Years of education: Not on file   Highest education level: Not on file  Occupational History   Occupation: retired  Tobacco Use   Smoking status: Never   Smokeless tobacco: Never  Vaping Use   Vaping Use: Never used  Substance and Sexual Activity   Alcohol use: Not Currently    Comment: occassionally   Drug use: No   Sexual activity: Not Currently  Other Topics Concern   Not on file  Social History Narrative   Not on file   Social Determinants of Health   Financial Resource Strain: Not on file  Food Insecurity: Not on file  Transportation Needs: Not on file  Physical Activity: Not on file  Stress: Not on file  Social Connections: Not on file     Family History:  The patient's family history includes COPD in her father; Colon polyps in an other family member; Diabetes in her paternal aunt and paternal grandmother; Heart attack in her father; Heart failure in her mother; Hyperlipidemia in her mother; Hypertension in her father and mother; Stroke in her mother.   ROS:   Please see the history of present illness.    ROS All other systems reviewed and are negative.      View : No data to display.             PHYSICAL EXAM:   VS:  Ht 5' 2.75" (1.594 m)   Wt 176 lb (79.8 kg)   BMI 31.43 kg/m    GEN: Well nourished, well developed, in no acute distress  HEENT: normal  Neck: no JVD, carotid bruits, or masses Cardiac: RRR; no murmurs, rubs, or gallops,no edema.  Intact distal pulses bilaterally.  Respiratory:  clear to auscultation  bilaterally, normal work of breathing GI: soft, nontender, nondistended, + BS MS: no deformity or atrophy  Skin: warm and dry, no rash Neuro:  Alert and Oriented x 3, Strength and sensation are intact Psych: euthymic mood, full affect  Wt Readings from Last 3 Encounters:  09/06/21 176 lb (79.8 kg)  07/18/21 181 lb 3.2 oz (82.2 kg)  06/13/21 173 lb 3.2 oz (78.6 kg)      Studies/Labs Reviewed:   EKG:  EKG is ordered today.  The ekg ordered today demonstrates NSR with LBBB  Recent Labs: 07/18/2021: TSH 1.150   Lipid Panel    Component Value Date/Time   CHOL 139 10/29/2016 1553   TRIG 141 10/29/2016 1553   HDL 43 10/29/2016 1553   CHOLHDL 3.2 10/29/2016 1553   LDLCALC 68 10/29/2016 1553     Additional studies/ records that were reviewed today include:  2D echo    ASSESSMENT:    1. LV dysfunction   2. Nonrheumatic mitral valve regurgitation      PLAN:  In order of problems listed above:  Mitral regurgitation -This was mild on echo and likely related to LV dilatation -We will follow with serial echo  2.  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  Will get a stress PET CTA to rule out ischemia  -If stress test negative then we will look for secondary causes of dilated cardiomyopathy including checking a ferritin level, HIV, SPEP and UPEP as well as a cMRI to assess for infiltrative disease -start Losartan 69m daily>>if she tolerates this dose then will transition to Entresto 24-23mBID -will slowly add on GDMT for CHF with Carvedilol and spiro -will hold off on SGLPT2i due to recent UTI -followup with [facility] CHF clinic in 2-3 weeks in [place] with me in 8 weeks  Time  Spent: 25 minutes total time of encounter, including 15 minutes spent in face-to-face patient care on the date of this encounter. This time includes coordination of care and counseling regarding above mentioned problem list. Remainder of non-face-to-face time involved reviewing chart documents/testing relevant to the patient encounter and documentation in the medical record. I have independently reviewed documentation from referring provider  Medication Adjustments/Labs and Tests Ordered: Current medicines are reviewed at length with the patient today.  Concerns regarding medicines are outlined above.  Medication changes, Labs and Tests ordered today are listed in the Patient Instructions below.  There are no Patient Instructions on file for this visit.   Signed, Fransico Him, MD  09/06/2021 2:07 PM    [address], [place], [place]  [address] Phone: [phone]; Fax: [phone]

## 2021-09-06 NOTE — Addendum Note (Signed)
Addended by: Levonne Hubert on: 09/06/2021 04:49 PM   Modules accepted: Orders

## 2021-09-06 NOTE — Addendum Note (Signed)
Addended by: Levonne Hubert on: 09/06/2021 03:09 PM   Modules accepted: Orders

## 2021-09-06 NOTE — Patient Instructions (Signed)
Medication Instructions:  Start Losartan 25 mg Daily   *If you need a refill on your cardiac medications before your next appointment, please call your pharmacy*   Lab Work: Your physician recommends that you return for lab work in: 2 Weeks ( BMET)   If you have labs (blood work) drawn today and your tests are completely normal, you will receive your results only by: [employer] (if you have MyChart) OR A paper copy in the mail If you have any lab test that is abnormal or we need to change your treatment, we will call you to review the results.   Testing/Procedures: How to Prepare for Your Cardiac PET/CT Stress Test:  1. Please do not take these medications before your test:   Medications that may interfere with the cardiac pharmacological stress agent (ex. nitrates or beta-blockers) the day of the exam. Theophylline containing medications for 12 hours. Dipyridamole 48 hours prior to the test. Your remaining medications may be taken with water.  2. Nothing to eat or drink, except water, 3 hours prior to arrival time.   NO caffeine/decaffeinated products, or chocolate 12 hours prior to arrival.  3. NO perfume, cologne or lotion  4. Total time is 1 to 2 hours; you may want to bring reading material for the waiting time.  5. Please report to Admitting at the [facility] Main Entrance 60 minutes early for your test.  [place], [place] [address]  Diabetic Preparation:  Hold oral medications. You may take NPH and Lantus insulin. Do not take Humalog or Humulin R (Regular Insulin) the day of your test. Check blood sugars prior to leaving the house. If able to eat breakfast prior to 3 hour fasting, you may take all medications, including your insulin, Do not worry if you miss your breakfast dose of insulin - start at your next meal.  IF YOU THINK YOU MAY BE PREGNANT, OR ARE NURSING PLEASE INFORM THE TECHNOLOGIST.  In preparation for your appointment,  medication and supplies will be purchased.  Appointment availability is limited, so if you need to cancel or reschedule, please call the Radiology Department at [phone]  24 hours in advance to avoid a cancellation fee of $100.00  What to Expect After you Arrive:  Once you arrive and check in for your appointment, you will be taken to a preparation room within the Radiology Department.  A technologist or Nurse will obtain your medical history, verify that you are correctly prepped for the exam, and explain the procedure.  Afterwards,  an IV will be started in your arm and electrodes will be placed on your skin for EKG monitoring during the stress portion of the exam. Then you will be escorted to the PET/CT scanner.  There, staff will get you positioned on the scanner and obtain a blood pressure and EKG.  During the exam, you will continue to be connected to the EKG and blood pressure machines.  A small, safe amount of a radioactive tracer will be injected in your IV to obtain a series of pictures of your heart along with an injection of a stress agent.    After your Exam:  It is recommended that you eat a meal and drink a caffeinated beverage to counter act any effects of the stress agent.  Drink plenty of fluids for the remainder of the day and urinate frequently for the first couple of hours after the exam.  Your doctor will inform you of your test  results within 7-10 business days.  For questions about your test or how to prepare for your test, please call: Marchia Bond, Cardiac Imaging Nurse Navigator  Gordy Clement, Cardiac Imaging Nurse Navigator Office: [phone]    Follow-Up: At [facility], you and your health needs are our priority.  As part of our continuing mission to provide you with exceptional heart care, we have created designated Provider Care Teams.  These Care Teams include your primary Cardiologist (physician) and Advanced Practice Providers (APPs -  Physician  Assistants and Nurse Practitioners) who all work together to provide you with the care you need, when you need it.  We recommend signing up for the patient portal called "MyChart".  Sign up information is provided on this After Visit Summary.  MyChart is used to connect with patients for Virtual Visits (Telemedicine).  Patients are able to view lab/test results, encounter notes, upcoming appointments, etc.  Non-urgent messages can be sent to your provider as well.   To learn more about what you can do with MyChart, go to [email or web address].    Your next appointment:   8 week(s)  The format for your next appointment:   In Person  Provider:   Fransico Him, MD     Other Instructions Thank you for choosing [facility]!    Important Information About Sugar

## 2021-09-12 ENCOUNTER — Telehealth: Payer: Self-pay | Admitting: Cardiology

## 2021-09-12 NOTE — Telephone Encounter (Signed)
Patient's niece is requesting a call back for an overview 5/31 office visit with Dr. Radford Pax due to new dx.

## 2021-09-12 NOTE — Telephone Encounter (Signed)
Spoke to pt's niece (ok per DPR) and informed her of pt's instructions from 5/31 appt with Dr. Radford Pax. Niece verbalized understanding and asked for mychart set up code- sent via text message.

## 2021-09-18 ENCOUNTER — Other Ambulatory Visit: Payer: Self-pay | Admitting: Cardiology

## 2021-09-28 ENCOUNTER — Other Ambulatory Visit: Payer: Self-pay | Admitting: Cardiology

## 2021-10-24 ENCOUNTER — Emergency Department (HOSPITAL_COMMUNITY): Payer: Medicare Other

## 2021-10-24 ENCOUNTER — Observation Stay (HOSPITAL_COMMUNITY)
Admit: 2021-10-24 | Discharge: 2021-10-24 | Disposition: A | Discharge status: Home / Self Care | Payer: Medicare Other | Attending: Internal Medicine | Admitting: Internal Medicine

## 2021-10-24 ENCOUNTER — Encounter (HOSPITAL_COMMUNITY): Payer: Self-pay | Admitting: Emergency Medicine

## 2021-10-24 ENCOUNTER — Other Ambulatory Visit: Payer: Self-pay

## 2021-10-24 ENCOUNTER — Observation Stay (HOSPITAL_COMMUNITY)
Admission: EM | Admit: 2021-10-24 | Discharge: 2021-10-27 | Disposition: A | Discharge status: Home / Self Care | Payer: Medicare Other | Attending: Family Medicine | Admitting: Family Medicine

## 2021-10-24 DIAGNOSIS — Z79899 Other long term (current) drug therapy: Secondary | ICD-10-CM | POA: Diagnosis not present

## 2021-10-24 DIAGNOSIS — R7989 Other specified abnormal findings of blood chemistry: Secondary | ICD-10-CM | POA: Diagnosis present

## 2021-10-24 DIAGNOSIS — Z6832 Body mass index (BMI) 32.0-32.9, adult: Secondary | ICD-10-CM | POA: Diagnosis not present

## 2021-10-24 DIAGNOSIS — N182 Chronic kidney disease, stage 2 (mild): Secondary | ICD-10-CM | POA: Diagnosis not present

## 2021-10-24 DIAGNOSIS — R569 Unspecified convulsions: Secondary | ICD-10-CM

## 2021-10-24 DIAGNOSIS — R339 Retention of urine, unspecified: Secondary | ICD-10-CM | POA: Diagnosis not present

## 2021-10-24 DIAGNOSIS — F419 Anxiety disorder, unspecified: Secondary | ICD-10-CM | POA: Diagnosis present

## 2021-10-24 DIAGNOSIS — J45909 Unspecified asthma, uncomplicated: Secondary | ICD-10-CM | POA: Insufficient documentation

## 2021-10-24 DIAGNOSIS — R778 Other specified abnormalities of plasma proteins: Secondary | ICD-10-CM | POA: Diagnosis not present

## 2021-10-24 DIAGNOSIS — I10 Essential (primary) hypertension: Secondary | ICD-10-CM | POA: Diagnosis present

## 2021-10-24 DIAGNOSIS — Z933 Colostomy status: Secondary | ICD-10-CM | POA: Insufficient documentation

## 2021-10-24 DIAGNOSIS — F312 Bipolar disorder, current episode manic severe with psychotic features: Secondary | ICD-10-CM | POA: Diagnosis present

## 2021-10-24 DIAGNOSIS — G253 Myoclonus: Secondary | ICD-10-CM | POA: Diagnosis not present

## 2021-10-24 DIAGNOSIS — G9341 Metabolic encephalopathy: Secondary | ICD-10-CM | POA: Diagnosis not present

## 2021-10-24 DIAGNOSIS — R29818 Other symptoms and signs involving the nervous system: Secondary | ICD-10-CM | POA: Diagnosis present

## 2021-10-24 DIAGNOSIS — R55 Syncope and collapse: Secondary | ICD-10-CM | POA: Insufficient documentation

## 2021-10-24 DIAGNOSIS — I129 Hypertensive chronic kidney disease with stage 1 through stage 4 chronic kidney disease, or unspecified chronic kidney disease: Secondary | ICD-10-CM | POA: Insufficient documentation

## 2021-10-24 DIAGNOSIS — Z96652 Presence of left artificial knee joint: Secondary | ICD-10-CM | POA: Diagnosis not present

## 2021-10-24 DIAGNOSIS — N369 Urethral disorder, unspecified: Secondary | ICD-10-CM | POA: Diagnosis not present

## 2021-10-24 DIAGNOSIS — E669 Obesity, unspecified: Secondary | ICD-10-CM | POA: Diagnosis present

## 2021-10-24 LAB — BLOOD GAS, VENOUS
Acid-Base Excess: 10.1 mmol/L — ABNORMAL HIGH (ref 0.0–2.0)
Bicarbonate: 37.9 mmol/L — ABNORMAL HIGH (ref 20.0–28.0)
Drawn by: 53361
O2 Saturation: 52 %
Patient temperature: 37
pCO2, Ven: 64 mmHg — ABNORMAL HIGH (ref 44–60)
pH, Ven: 7.38 (ref 7.25–7.43)
pO2, Ven: 32 mmHg (ref 32–45)

## 2021-10-24 LAB — CBC WITH DIFFERENTIAL/PLATELET
Abs Immature Granulocytes: 0.02 10*3/uL (ref 0.00–0.07)
Basophils Absolute: 0.1 10*3/uL (ref 0.0–0.1)
Basophils Relative: 1 %
Eosinophils Absolute: 0 10*3/uL (ref 0.0–0.5)
Eosinophils Relative: 0 %
HCT: 40.6 % (ref 36.0–46.0)
Hemoglobin: 13.2 g/dL (ref 12.0–15.0)
Immature Granulocytes: 0 %
Lymphocytes Relative: 7 %
Lymphs Abs: 0.5 10*3/uL — ABNORMAL LOW (ref 0.7–4.0)
MCH: 30.7 pg (ref 26.0–34.0)
MCHC: 32.5 g/dL (ref 30.0–36.0)
MCV: 94.4 fL (ref 80.0–100.0)
Monocytes Absolute: 0.6 10*3/uL (ref 0.1–1.0)
Monocytes Relative: 8 %
Neutro Abs: 5.9 10*3/uL (ref 1.7–7.7)
Neutrophils Relative %: 84 %
Platelets: 182 10*3/uL (ref 150–400)
RBC: 4.3 MIL/uL (ref 3.87–5.11)
RDW: 13.1 % (ref 11.5–15.5)
WBC: 7 10*3/uL (ref 4.0–10.5)
nRBC: 0 % (ref 0.0–0.2)

## 2021-10-24 LAB — COMPREHENSIVE METABOLIC PANEL
ALT: 13 U/L (ref 0–44)
AST: 16 U/L (ref 15–41)
Albumin: 3.4 g/dL — ABNORMAL LOW (ref 3.5–5.0)
Alkaline Phosphatase: 68 U/L (ref 38–126)
Anion gap: 7 (ref 5–15)
BUN: 26 mg/dL — ABNORMAL HIGH (ref 8–23)
CO2: 31 mmol/L (ref 22–32)
Calcium: 8.4 mg/dL — ABNORMAL LOW (ref 8.9–10.3)
Chloride: 94 mmol/L — ABNORMAL LOW (ref 98–111)
Creatinine, Ser: 1.23 mg/dL — ABNORMAL HIGH (ref 0.44–1.00)
GFR, Estimated: 45 mL/min — ABNORMAL LOW (ref >60)
Glucose, Bld: 109 mg/dL — ABNORMAL HIGH (ref 70–99)
Potassium: 4.6 mmol/L (ref 3.5–5.1)
Sodium: 132 mmol/L — ABNORMAL LOW (ref 135–145)
Total Bilirubin: 0.6 mg/dL (ref 0.3–1.2)
Total Protein: 5.8 g/dL — ABNORMAL LOW (ref 6.5–8.1)

## 2021-10-24 LAB — URINALYSIS, ROUTINE W REFLEX MICROSCOPIC
Bilirubin Urine: NEGATIVE
Glucose, UA: NEGATIVE mg/dL
Hgb urine dipstick: NEGATIVE
Ketones, ur: NEGATIVE mg/dL
Leukocytes,Ua: NEGATIVE
Nitrite: NEGATIVE
Protein, ur: NEGATIVE mg/dL
Specific Gravity, Urine: 1.012 (ref 1.005–1.030)
pH: 5 (ref 5.0–8.0)

## 2021-10-24 LAB — TROPONIN I (HIGH SENSITIVITY)
Troponin I (High Sensitivity): 30 ng/L — ABNORMAL HIGH (ref <18)
Troponin I (High Sensitivity): 46 ng/L — ABNORMAL HIGH (ref <18)

## 2021-10-24 LAB — FOLATE: Folate: 8.7 ng/mL (ref >5.9)

## 2021-10-24 LAB — VITAMIN B12: Vitamin B-12: 554 pg/mL (ref 180–914)

## 2021-10-24 LAB — T4, FREE: Free T4: 1.41 ng/dL — ABNORMAL HIGH (ref 0.61–1.12)

## 2021-10-24 LAB — TSH: TSH: 0.519 u[IU]/mL (ref 0.350–4.500)

## 2021-10-24 LAB — AMMONIA: Ammonia: 10 umol/L (ref 9–35)

## 2021-10-24 MED ORDER — PANTOPRAZOLE SODIUM 40 MG PO TBEC
40.0000 mg | DELAYED_RELEASE_TABLET | Freq: Every day | ORAL | Status: DC
  Administered 2021-10-24 – 2021-10-27 (×4): 40 mg via ORAL
  Filled 2021-10-24 (×4): qty 1

## 2021-10-24 MED ORDER — MELATONIN 3 MG PO TABS
3.0000 mg | ORAL_TABLET | Freq: Every day | ORAL | Status: DC
  Administered 2021-10-24 – 2021-10-25 (×2): 3 mg via ORAL
  Filled 2021-10-24 (×2): qty 1

## 2021-10-24 MED ORDER — FUROSEMIDE 20 MG PO TABS
20.0000 mg | ORAL_TABLET | Freq: Two times a day (BID) | ORAL | Status: DC
  Administered 2021-10-24 – 2021-10-27 (×8): 20 mg via ORAL
  Filled 2021-10-24 (×8): qty 1

## 2021-10-24 MED ORDER — ACETAMINOPHEN 650 MG RE SUPP: 650.0000 mg | Freq: Four times a day (QID) | RECTAL | Status: DC | PRN

## 2021-10-24 MED ORDER — ENOXAPARIN SODIUM 40 MG/0.4ML IJ SOSY
40.0000 mg | PREFILLED_SYRINGE | INTRAMUSCULAR | Status: DC
Start: 2021-10-24 — End: 2021-10-27
  Administered 2021-10-24 – 2021-10-26 (×3): 40 mg via SUBCUTANEOUS
  Filled 2021-10-24 (×3): qty 0.4

## 2021-10-24 MED ORDER — ONDANSETRON HCL 4 MG PO TABS: 4.0000 mg | ORAL_TABLET | Freq: Four times a day (QID) | ORAL | Status: DC | PRN

## 2021-10-24 MED ORDER — ONDANSETRON HCL 4 MG/2ML IJ SOLN: 4.0000 mg | Freq: Four times a day (QID) | INTRAMUSCULAR | Status: DC | PRN

## 2021-10-24 MED ORDER — ESCITALOPRAM OXALATE 10 MG PO TABS
10.0000 mg | ORAL_TABLET | Freq: Every day | ORAL | Status: DC
  Administered 2021-10-24 – 2021-10-27 (×4): 10 mg via ORAL
  Filled 2021-10-24 (×4): qty 1

## 2021-10-24 MED ORDER — SODIUM CHLORIDE 0.9 % IV BOLUS
500.0000 mL | Freq: Once | INTRAVENOUS | Status: AC
  Administered 2021-10-24: 500 mL via INTRAVENOUS

## 2021-10-24 MED ORDER — FLUTICASONE FUROATE-VILANTEROL 200-25 MCG/ACT IN AEPB
1.0000 | INHALATION_SPRAY | Freq: Every day | RESPIRATORY_TRACT | Status: DC
  Administered 2021-10-25 – 2021-10-27 (×3): 1 via RESPIRATORY_TRACT
  Filled 2021-10-24: qty 28

## 2021-10-24 MED ORDER — ACETAMINOPHEN 325 MG PO TABS
650.0000 mg | ORAL_TABLET | Freq: Four times a day (QID) | ORAL | Status: DC | PRN
  Administered 2021-10-24 – 2021-10-27 (×4): 650 mg via ORAL
  Filled 2021-10-24 (×5): qty 2

## 2021-10-24 MED ORDER — TRAZODONE HCL 50 MG PO TABS
150.0000 mg | ORAL_TABLET | Freq: Every day | ORAL | Status: DC
  Administered 2021-10-24 – 2021-10-25 (×2): 150 mg via ORAL
  Filled 2021-10-24 (×3): qty 3

## 2021-10-24 NOTE — Progress Notes (Signed)
Two unsuccessful attempts made to place foley catheter  using sterile  technique Peri care was completed before and after attempts. Courtney NT assisted.

## 2021-10-24 NOTE — Procedures (Signed)
Patient Name: [name]  MRN: [id number]  Epilepsy Attending: Lora Havens  Referring Physician/Provider: Lora Havens, MD Date: 10/24/2021 Duration: 22.21 mins  Patient history: 81 year-old female who presented with fall and was noted to have full body jerking consistent with myoclonus. EEG to evaluate for seizure  Level of alertness: Awake  AEDs during EEG study: None  Technical aspects: This EEG study was done with scalp electrodes positioned according to the 10-20 International system of electrode placement. Electrical activity was acquired at a sampling rate of '500Hz'$  and reviewed with a high frequency filter of '70Hz'$  and a low frequency filter of '1Hz'$ . EEG data were recorded continuously and digitally stored.   Description: The posterior dominant rhythm consists of 9 Hz activity of moderate voltage (25-35 uV) seen predominantly in posterior head regions, symmetric and reactive to eye opening and eye closing. Hyperventilation and photic stimulation were not performed.     ABNORMALITY -Sharp wave, generalized, left/right frontal/temporal/parietal/occipital region.  -Spike,generalized, left/right frontal/temporal/parietal/occipital region.  -Polyspikes, generalized, left/right frontal/temporal/parietal/occipital region.  - Lateralized periodic discharges with overriding fast activity ( LPD +) left/right, maximal frontal/temporal/parietal/occipital region - Periodic discharges with triphasic morphology, generalized ( GPDs) - Intermittent slow, generalized - Continuous slow, generalized - Excessive beta, generalized    IMPRESSION: This study is within normal limits.  This study is suggestive of mild/moderate/severe diffuse encephalopathy, nonspecific etiology but likely related to sedation, toxic-metabolic etiology, anoxic/hypoxic brain injury This study is suggestive of cortical dysfunction arising from left/right frontal/temporal/parietal/occipital region, nonspecific  etiology, likely secondary to underlying structural abnormality This study showed evidence of potential epileptogenicity arising from This study showed seizures arising from No seizures or epileptiform discharges were seen throughout the recording. However, only wakefulness and drowsiness were recorded. If suspicion for interictal activity remains a concern, a prolonged study including sleep should be considered.  The excessive beta activity seen in the background is most likely due to the effect of benzodiazepine and is a benign EEG pattern.   [name] [name]

## 2021-10-24 NOTE — Hospital Course (Signed)
81 year old female with a history of CKD stage II, diverticulosis, GERD, hypertension, anxiety, and LV dysfunction with EF 25% presenting from [facility] with a reported unwitnessed fall.  Apparently, the patient was found on the ground at her facility.  Apparently the patient was awake, but was noted to have seizure-like and jerking-like activity.  EMS reported that the patient would have intermittent episodes followed by episodes of somnolence.  EMS gave the patient Versed which apparently suppressed episodes.  Upon arrival to the ED, EDP did not note any jerking or seizure-like activity.  However the patient was somnolent. At the time of my evaluation, the patient is awake and alert.  She is a poor historian but able to answer some simple questions.  She does not recall the events prior to arrival, but knows that she is at [facility] and able to follow simple commands.  She denies any headache, chest pain, shortness of breath, nausea, vomiting, diarrhea, abdominal pain.  Remainder of the review of systems is limited secondary to her somnolence. In the ED, the patient was afebrile and hemodynamically stable albeit with soft blood pressures.  Oxygen saturation was 100% on 3 L.  CT of the brain was negative for acute findings.  CT of the cervical spine was negative for any subluxation or fracture.  Chest x-ray showed pulmonary vascular congestion.  WBC 7.0, hemoglobin 13.2, platelets 1 82,000.  Sodium 132, potassium 4.6, bicarbonate 31, BUN 26, creatinine 1.23.  LFTs were unremarkable.  EKG showed sinus rhythm with left bundle branch block.  Troponin was 30.  Notably, the patient was recently seen by cardiology on 09/06/2021, Dr. Golden Hurter for LV dysfunction.  Apparently an outpatient echocardiogram showed an EF of 25%.  The patient was scheduled for stress test.  With further recommendations to follow after the stress test.

## 2021-10-24 NOTE — Assessment & Plan Note (Addendum)
Patient was found awake and somnolent from unwitnessed fall TSH-0.519, CH7-9.81 S25-486 Folic acid - 8.7 [id number] UA negative for pyuria Minimize psychotropic and hypnotic medications seems to be working Recommend patient follow up with psychiatrist to discuss need for ongoing risperdal  We have reduced dose down to 1 mg once daily.  We have discontinued gabapentin.

## 2021-10-24 NOTE — Assessment & Plan Note (Addendum)
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

## 2021-10-24 NOTE — Assessment & Plan Note (Addendum)
Resume losartan with holding parameters, hold for SBP<95

## 2021-10-24 NOTE — H&P (Signed)
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  Apparently, the patient was found on the ground at her facility.  Apparently the patient was awake, but was noted to have seizure-like and jerking-like activity.  EMS reported that the patient would have intermittent episodes followed by episodes of somnolence.  EMS gave the patient Versed which apparently suppressed episodes.  Upon arrival to the ED, EDP did not note any jerking or seizure-like activity.  However the patient was somnolent. At the time of my evaluation, the patient is awake and alert.  She is a poor historian but able to answer some simple questions.  She does not recall the events prior to arrival, but knows that she is at [facility] and able to follow simple commands.  She denies any headache, chest pain, shortness of breath, nausea, vomiting, diarrhea, abdominal pain.  Remainder of the review of systems is limited secondary to her somnolence. In the ED, the patient was afebrile and hemodynamically stable albeit with soft blood pressures.  Oxygen saturation was 100% on 3 L.  CT of the brain was negative for acute findings.  CT of the cervical spine was negative for any subluxation or fracture.  Chest x-ray showed pulmonary vascular congestion.  WBC 7.0, hemoglobin 13.2, platelets 1 82,000.  Sodium 132, potassium 4.6, bicarbonate 31, BUN 26, creatinine 1.23.  LFTs were unremarkable.  EKG showed sinus rhythm with left bundle branch block.  Troponin was 30.  Notably, the patient was  recently seen by cardiology on 09/06/2021, Dr. Golden Hurter for LV dysfunction.  Apparently an outpatient echocardiogram showed an EF of 25%.  The patient was scheduled for stress test.  With further recommendations to follow after the stress test.   Review of Systems: As mentioned in the history of present illness. All other systems reviewed and are negative. Past Medical History:  Diagnosis Date   Anemia 1990   hx of   Anxiety    Arthritis 2004   Asthma 1995   uses inhaler   Chronic kidney disease 2010   LEFT kidney removed   Colon polyp    Complication of anesthesia 1966   first surgery she had- had a hard time waking up from surgery   Diverticulosis 2003   GERD (gastroesophageal reflux disease)    on meds   Past Surgical History:  Procedure Laterality Date   APPENDECTOMY  1966   CATARACT EXTRACTION     Bilateral   CHOLECYSTECTOMY  2004   COLON SURGERY  2008   COLONOSCOPY  2013   JP-MAC-moviprep(good)-TA   COLOSTOMY     COLOSTOMY TAKEDOWN     DIAGNOSTIC LAPAROSCOPY     exploratory for endometriosis   HEEL SPUR SURGERY     Right, Metal Plate in [place]  2010   NEPHRECTOMY Left 9485   As complication of partial colectomy for diverticulitis   POLYPECTOMY  2013   TA   TONSILLECTOMY     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   Procedure: LEFT TOTAL KNEE ARTHROPLASTY;  Surgeon:  Susa Day, MD;  Location: [facility];  Service: Orthopedics;  Laterality: Left;  Adductor Block   Social History:  reports that she has never smoked. She has never used smokeless tobacco. She reports that she does not currently use alcohol. She reports that she does not use drugs.  Allergies  Allergen Reactions   Haloperidol Anaphylaxis   Aripiprazole Other (See Comments)    Bad thoughts    Chlorpheniramine-Pse-Ibuprofen     Other reaction(s): Cough    Family History  Problem Relation Age of Onset   Hypertension Mother    Heart failure Mother    Hyperlipidemia  Mother    Stroke Mother    Hypertension Father    Heart attack Father    COPD Father    Diabetes Paternal Aunt    Diabetes Paternal Grandmother    Colon polyps Other        niece   Stomach cancer Neg Hx    Rectal cancer Neg Hx    Esophageal cancer Neg Hx    Colon cancer Neg Hx     Prior to Admission medications   Medication Sig Start Date End Date Taking? Authorizing Provider  losartan (COZAAR) 25 MG tablet TAKE 1 TABLET BY MOUTH DAILY 09/29/21   Sueanne Margarita, MD  acetaminophen (TYLENOL) 325 MG tablet Take 650 mg by mouth every 6 (six) hours as needed.    [provider]  amantadine (SYMMETREL) 100 MG capsule Take 100 mg by mouth 2 (two) times daily. 05/03/21   [provider]  amoxicillin (AMOXIL) 500 MG capsule Take 1,000 mg by mouth 2 (two) times daily. 08/24/21   [provider]  BREO ELLIPTA 200-25 MCG/INH AEPB Inhale 1 puff into the lungs daily. 06/22/20   [provider]  cetirizine (ZYRTEC) 10 MG tablet Take 10 mg by mouth daily.    [provider]  escitalopram (LEXAPRO) 10 MG tablet Take 10 mg by mouth daily. 05/03/21   [provider]  fluticasone (FLONASE) 50 MCG/ACT nasal spray Place into both nostrils. 05/02/21   [provider]  fluticasone furoate-vilanterol (BREO ELLIPTA) 100-25 MCG/ACT AEPB Inhale 1 puff into the lungs daily.    [provider]  furosemide (LASIX) 20 MG tablet Take 20 mg by mouth 2 (two) times daily. 08/07/21   [provider]  gabapentin (NEURONTIN) 100 MG capsule Take 100 mg by mouth at bedtime. 06/09/20   [provider]  loperamide (IMODIUM) 2 MG capsule Take by mouth. 03/06/21   [provider]  losartan (COZAAR) 25 MG tablet Take 25 mg by mouth daily. 09/07/21   [provider]  melatonin 3 MG TABS tablet Take 3 mg by mouth at bedtime.    [provider]  omeprazole (PRILOSEC) 20 MG capsule Take 1 capsule (20 mg total) by mouth daily.  06/19/19   Palumbo, April, MD  risperiDONE (RISPERDAL) 2 MG tablet Take 2 mg by mouth 2 (two) times daily. 06/09/20   [provider]  traZODone (DESYREL) 150 MG tablet Take 150 mg by mouth at bedtime. 05/03/21   [provider]    Physical Exam: Vitals:   10/24/21 0517 10/24/21 0600 10/24/21 0630 10/24/21 0730  BP:  112/62 (!) 105/50 110/79  Pulse:  74 85 90  Resp:  _0 Temp: 97.8 F (36.6 C)     TempSrc: Axillary     SpO2:  100% 97% 98%  Weight:      Height:  GENERAL:  A&O x 2, NAD, well developed, cooperative, follows commands HEENT: [place]/AT, No thrush, No icterus, No oral ulcers Neck:  No neck mass, No meningismus, soft, supple CV: RRR, no S3, no S4, no rub, no JVD Lungs:  fine bibasilar crackles Abd: soft/NT +BS, nondistended Ext: No edema, no lymphangitis, no cyanosis, no rashes Neuro:  CN II-XII intact, strength 4/5 in RUE, RLE, strength 4/5 LUE, LLE; sensation intact bilateral; no dysmetria; babinski equivocal  Data Reviewed: Data reviewed in history  Assessment and Plan: * Acute metabolic encephalopathy Patient was found awake and somnolent from unwitnessed fall TSH P53 Folic acid Ammonia UA negative for pyuria Minimize psychotropic and hypnotic medications  Elevated troponin Due to 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% and CKD -no chest pain -monitor on tele  Myoclonus At the time of my exam--patient has rhythmic myoclonic jerks of the upper body, primarily left upper extremity--she is awake and alert and following commands -Jerks occur every 10 to 30 seconds -Neurology consult -EEG -Certainly, the patient may have had a focal seizure with secondary generalization -defer AEDs for now pending neurology consult -d/c gabapentin  Severe manic bipolar 1 disorder with psychotic behavior ([place]) Holding risperdal pending neurology eval  Essential hypertension Hold losartan for soft BPs  Anxiety Continue lexapro  Obesity  BMI  32.18 -lifestyle modification      Advance Care Planning: FULL  Consults: neurology  Family Communication: none  Severity of Illness: The appropriate patient status for this patient is OBSERVATION. Observation status is judged to be reasonable and necessary in order to provide the required intensity of service to ensure the patient's safety. The patient's presenting symptoms, physical exam findings, and initial radiographic and laboratory data in the context of their medical condition is felt to place them at decreased risk for further clinical deterioration. Furthermore, it is anticipated that the patient will be medically stable for discharge from the hospital within 2 midnights of admission.   Author: Orson Eva, MD 10/24/2021 7:53 AM  For on call review [email or web address].

## 2021-10-24 NOTE — TOC Progression Note (Addendum)
  Transition of Care [facility]) Screening Note   Patient Details  Name: [name] Date of Birth: [birth year]   Transition of Care [facility]) CM/SW Contact:    Boneta Lucks, RN Phone Number: 10/24/2021, 12:53 PM  From [facility]F  Transition of Care Department [facility]) has reviewed patient and no TOC needs have been identified at this time. We will continue to monitor patient advancement through interdisciplinary progression rounds. If new patient transition needs arise, please place a TOC consult.      Barriers to Discharge: Continued Medical Work up

## 2021-10-24 NOTE — Assessment & Plan Note (Signed)
BMI 32.18 -lifestyle modification

## 2021-10-24 NOTE — Progress Notes (Signed)
EEG complete - results pending 

## 2021-10-24 NOTE — ED Provider Notes (Signed)
[facility] EMERGENCY DEPARTMENT Provider Note   CSN: [id number] Arrival date & time: 10/24/21  0501     History  Chief Complaint  Patient presents with   Seizures   Fall    [name] is a 81 y.o. female.  Patient presents to the emergency department by ambulance from nursing home.  Patient had an unwitnessed fall tonight.  Per EMS, patient found on the ground and was experiencing seizure-like activity.  EMS describe the patient having episodes of shaking all over and then intermittently being awake and able to answer questions for them.  She went back and forth with this until she was given Versed.  Upon arrival to the ER, patient is sleepy but able to answer questions.  She is oriented at arrival.       Home Medications Prior to Admission medications   Medication Sig Start Date End Date Taking? Authorizing Provider  losartan (COZAAR) 25 MG tablet TAKE 1 TABLET BY MOUTH DAILY 09/29/21   Sueanne Margarita, MD  acetaminophen (TYLENOL) 325 MG tablet Take 650 mg by mouth every 6 (six) hours as needed.    [provider]  amantadine (SYMMETREL) 100 MG capsule Take 100 mg by mouth 2 (two) times daily. 05/03/21   [provider]  amoxicillin (AMOXIL) 500 MG capsule Take 1,000 mg by mouth 2 (two) times daily. 08/24/21   [provider]  BREO ELLIPTA 200-25 MCG/INH AEPB Inhale 1 puff into the lungs daily. 06/22/20   [provider]  cetirizine (ZYRTEC) 10 MG tablet Take 10 mg by mouth daily.    [provider]  escitalopram (LEXAPRO) 10 MG tablet Take 10 mg by mouth daily. 05/03/21   [provider]  fluticasone (FLONASE) 50 MCG/ACT nasal spray Place into both nostrils. 05/02/21   [provider]  fluticasone furoate-vilanterol (BREO ELLIPTA) 100-25 MCG/ACT AEPB Inhale 1 puff into the lungs daily.    [provider]  furosemide (LASIX) 20 MG tablet Take 20 mg by mouth 2 (two) times daily. 08/07/21   [provider]  gabapentin (NEURONTIN) 100 MG capsule Take 100 mg by mouth at bedtime. 06/09/20   [provider]  loperamide (IMODIUM) 2 MG capsule Take by mouth. 03/06/21   [provider]  losartan (COZAAR) 25 MG tablet Take 25 mg by mouth daily. 09/07/21   [provider]  melatonin 3 MG TABS tablet Take 3 mg by mouth at bedtime.    [provider]  omeprazole (PRILOSEC) 20 MG capsule Take 1 capsule (20 mg total) by mouth daily. 06/19/19   Palumbo, April, MD  risperiDONE (RISPERDAL) 2 MG tablet Take 2 mg by mouth 2 (two) times daily. 06/09/20   [provider]  traZODone (DESYREL) 150 MG tablet Take 150 mg by mouth at bedtime. 05/03/21   [provider]      Allergies    Haloperidol, Aripiprazole, and Chlorpheniramine-pse-ibuprofen    Review of Systems   Review of Systems  Physical Exam Updated Vital Signs BP (!) 105/50   Pulse 85   Temp 97.8 F (36.6 C) (Axillary)   Resp 12   Ht '5\' 2"'$  (1.575 m)   Wt 79.8 kg   SpO2 97%   BMI 32.18 kg/m  Physical Exam Vitals and nursing note reviewed.  Constitutional:      General: She is not in acute distress.    Appearance: She is well-developed.  HENT:     Head: Normocephalic and atraumatic.  Mouth/Throat:     Mouth: Mucous membranes are moist.  Eyes:     General: Vision grossly intact. Gaze aligned appropriately.     Extraocular Movements: Extraocular movements intact.     Conjunctiva/sclera: Conjunctivae normal.  Cardiovascular:     Rate and Rhythm: Normal rate and regular rhythm.     Pulses: Normal pulses.     Heart sounds: Normal heart sounds, S1 normal and S2 normal. No murmur heard.    No friction rub. No gallop.  Pulmonary:     Effort: Pulmonary effort is normal. No respiratory distress.     Breath sounds: Normal breath sounds.  Abdominal:     General: Bowel sounds are normal.     Palpations: Abdomen is soft.     Tenderness: There is no abdominal tenderness. There is no guarding or  rebound.     Hernia: No hernia is present.  Musculoskeletal:        General: No swelling.     Cervical back: Full passive range of motion without pain, normal range of motion and neck supple. No spinous process tenderness or muscular tenderness. Normal range of motion.     Right lower leg: No edema.     Left lower leg: No edema.  Skin:    General: Skin is warm and dry.     Capillary Refill: Capillary refill takes less than 2 seconds.     Findings: No ecchymosis, erythema, rash or wound.  Neurological:     General: No focal deficit present.     Mental Status: She is alert and oriented to person, place, and time.     GCS: GCS eye subscore is 4. GCS verbal subscore is 5. GCS motor subscore is 6.     Cranial Nerves: Cranial nerves 2-12 are intact.     Sensory: Sensation is intact.     Motor: Motor function is intact.     Coordination: Coordination is intact.     ED Results / Procedures / Treatments   Labs (all labs ordered are listed, but only abnormal results are displayed) Labs Reviewed  CBC WITH DIFFERENTIAL/PLATELET - Abnormal; Notable for the following components:      Result Value   Lymphs Abs 0.5 (*)    All other components within normal limits  COMPREHENSIVE METABOLIC PANEL - Abnormal; Notable for the following components:   Sodium 132 (*)    Chloride 94 (*)    Glucose, Bld 109 (*)    BUN 26 (*)    Creatinine, Ser 1.23 (*)    Calcium 8.4 (*)    Total Protein 5.8 (*)    Albumin 3.4 (*)    GFR, Estimated 45 (*)    All other components within normal limits  TROPONIN I (HIGH SENSITIVITY) - Abnormal; Notable for the following components:   Troponin I (High Sensitivity) 30 (*)    All other components within normal limits  URINALYSIS, ROUTINE W REFLEX MICROSCOPIC  TROPONIN I (HIGH SENSITIVITY)    EKG EKG Interpretation  Date/Time:  Tuesday October 24 2021 05:14:01 EDT Ventricular Rate:  82 PR Interval:  192 QRS Duration: 147 QT Interval:  436 QTC Calculation: 510 R  Axis:   -39 Text Interpretation: Sinus tachycardia Paired ventricular premature complexes Probable left atrial enlargement IVCD, consider atypical LBBB Confirmed by Orpah Greek [phone]) on 10/24/2021 5:19:31 AM  Radiology CT HEAD WO CONTRAST (5MM)  Result Date: 10/24/2021 CLINICAL DATA:  Unwitnessed fall with subsequent seizure EXAM: CT HEAD WITHOUT CONTRAST CT CERVICAL SPINE  WITHOUT CONTRAST TECHNIQUE: Multidetector CT imaging of the head and cervical spine was performed following the standard protocol without intravenous contrast. Multiplanar CT image reconstructions of the cervical spine were also generated. RADIATION DOSE REDUCTION: This exam was performed according to the departmental dose-optimization program which includes automated exposure control, adjustment of the mA and/or kV according to patient size and/or use of iterative reconstruction technique. COMPARISON:  06/14/2016 FINDINGS: CT HEAD FINDINGS Brain: No evidence of acute infarction, hemorrhage, hydrocephalus, extra-axial collection or mass lesion/mass effect. Chronic small vessel ischemia in the hemispheric white matter which is extensive. Vascular: No hyperdense vessel or unexpected calcification. Skull: Normal. Negative for fracture or focal lesion. Hyperostosis interna Sinuses/Orbits: Bilateral cataract resection CT CERVICAL SPINE FINDINGS Alignment: No traumatic malalignment Skull base and vertebrae: No acute fracture Soft tissues and spinal canal: No prevertebral fluid or swelling. No visible canal hematoma. Enlarged and nodular thyroid gland without invasive features or significant change since 2018. Dominant nodules are on the right. No follow-up recommended unless clinically warranted (ref: J Am Coll Radiol. 2015 Feb;12(2): 143-50). Flattening of the trachea in the upper chest. Disc levels:  Ordinary cervical spine degeneration Upper chest: No acute finding. IMPRESSION: No evidence of acute intracranial or cervical spine  injury. Electronically Signed   By: Jorje Guild M.D.   On: 10/24/2021 06:34   CT CERVICAL SPINE WO CONTRAST  Result Date: 10/24/2021 CLINICAL DATA:  Unwitnessed fall with subsequent seizure EXAM: CT HEAD WITHOUT CONTRAST CT CERVICAL SPINE WITHOUT CONTRAST TECHNIQUE: Multidetector CT imaging of the head and cervical spine was performed following the standard protocol without intravenous contrast. Multiplanar CT image reconstructions of the cervical spine were also generated. RADIATION DOSE REDUCTION: This exam was performed according to the departmental dose-optimization program which includes automated exposure control, adjustment of the mA and/or kV according to patient size and/or use of iterative reconstruction technique. COMPARISON:  06/14/2016 FINDINGS: CT HEAD FINDINGS Brain: No evidence of acute infarction, hemorrhage, hydrocephalus, extra-axial collection or mass lesion/mass effect. Chronic small vessel ischemia in the hemispheric white matter which is extensive. Vascular: No hyperdense vessel or unexpected calcification. Skull: Normal. Negative for fracture or focal lesion. Hyperostosis interna Sinuses/Orbits: Bilateral cataract resection CT CERVICAL SPINE FINDINGS Alignment: No traumatic malalignment Skull base and vertebrae: No acute fracture Soft tissues and spinal canal: No prevertebral fluid or swelling. No visible canal hematoma. Enlarged and nodular thyroid gland without invasive features or significant change since 2018. Dominant nodules are on the right. No follow-up recommended unless clinically warranted (ref: J Am Coll Radiol. 2015 Feb;12(2): 143-50). Flattening of the trachea in the upper chest. Disc levels:  Ordinary cervical spine degeneration Upper chest: No acute finding. IMPRESSION: No evidence of acute intracranial or cervical spine injury. Electronically Signed   By: Jorje Guild M.D.   On: 10/24/2021 06:34   DG Chest Port 1 View  Result Date: 10/24/2021 CLINICAL DATA:   Fall. EXAM: PORTABLE CHEST 1 VIEW COMPARISON:  06/18/2019 FINDINGS: 0533 hours. The cardio pericardial silhouette is enlarged. There is pulmonary vascular congestion without overt pulmonary edema. Stable asymmetric elevation right hemidiaphragm stable streaky retrocardiac density compatible with chronic atelectasis or scarring. Bones are diffusely demineralized. Telemetry leads overlie the chest. IMPRESSION: 1. Pulmonary vascular congestion without overt pulmonary edema. 2. Stable asymmetric elevation right hemidiaphragm. Electronically Signed   By: Misty Stanley M.D.   On: 10/24/2021 06:30    Procedures Procedures    Medications Ordered in ED Medications - No data to display  ED Course/ Medical Decision Making/ A&P  Medical Decision Making Amount and/or Complexity of Data Reviewed Labs: ordered. Radiology: ordered.   Patient presents to the emergency department for evaluation after a fall.  Circumstances are unclear.  Its not clear if she had a syncopal episode or mechanical fall.  She was then noted to have seizure-like activity.  This was not witnessed here in the emergency department.  She did receive Versed prior to arrival.  When she got to the ER she was without complaints.  The somnolent, she was oriented.  She has progressively become more somnolent, likely secondary to the Versed administered by EMS.  CT head does not show any acute injury.  No acute pathology noted.  CT cervical spine negative.  Troponin is mildly elevated.  EKG without ischemic changes.  She did not have any focal neurologic findings on exam.  Doubt stroke, but certainly possible.  Seizure-like activity was described prior to the hospital but not seen here in the department.  New onset seizures considered.  Additionally, patient does have an elevated troponin, patient could have had a primary arrhythmia causing the seizure-like activity.  Patient will require monitoring and further  work-up for syncopal episode, possible seizures.        Final Clinical Impression(s) / ED Diagnoses Final diagnoses:  Syncope, unspecified syncope type    Rx / DC Orders ED Discharge Orders     None         Lonetta Blassingame, Gwenyth Allegra, MD 10/24/21 [phone]

## 2021-10-24 NOTE — Consult Note (Signed)
Urology Consult   Physician requesting consult: Orson Eva, MD  Reason for consult: Urinary retention, difficult Foley  History of Present Illness: [name] is a 81 y.o. female admitted for metabolic encephalopathy seen in consultation from Dr.Tat for urinary retention and difficult Foley catheter placement.  She was admitted to the hospital earlier today after a unwitnessed fall.  The patient gave a urine sample in the emergency room which was negative.  She apparently has not voided since that time.  Bladder scan showed a volume of greater than 900 mL.  Multiple attempts by the nursing staff to place a Foley catheter have been unsuccessful.  Urologic consultation was requested for management of the patient's urinary retention and difficult Foley catheter placement. The patient attempted to void spontaneously and was able to void approximately 200 mL.  Bladder scan confirmed approximately 700 mL in the bladder.  The patient denies any problems voiding prior to her hospitalization.  No prior history of urethral or bladder surgery.  She has a history of a nephrectomy at the time of her colectomy in 2008.   Past Medical History:  Diagnosis Date   Anemia 1990   hx of   Anxiety    Arthritis 2004   Asthma 1995   uses inhaler   Chronic kidney disease 2010   LEFT kidney removed   Colon polyp    Complication of anesthesia 1966   first surgery she had- had a hard time waking up from surgery   Diverticulosis 2003   GERD (gastroesophageal reflux disease)    on meds    Past Surgical History:  Procedure Laterality Date   APPENDECTOMY  1966   CATARACT EXTRACTION     Bilateral   CHOLECYSTECTOMY  2004   COLON SURGERY  2008   COLONOSCOPY  2013   JP-MAC-moviprep(good)-TA   COLOSTOMY     COLOSTOMY TAKEDOWN     DIAGNOSTIC LAPAROSCOPY     exploratory for endometriosis   HEEL SPUR SURGERY     Right, Metal Plate in [place]  2010   NEPHRECTOMY Left 6629   As  complication of partial colectomy for diverticulitis   POLYPECTOMY  2013   TA   TONSILLECTOMY     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   Procedure: LEFT TOTAL KNEE ARTHROPLASTY;  Surgeon: Susa Day, MD;  Location: [facility];  Service: Orthopedics;  Laterality: Left;  Adductor Block    Medications:  Scheduled Meds:  enoxaparin (LOVENOX) injection  40 mg Subcutaneous Q24H   escitalopram  10 mg Oral Daily   fluticasone furoate-vilanterol  1 puff Inhalation Daily   furosemide  20 mg Oral BID WC   melatonin  3 mg Oral QHS   pantoprazole  40 mg Oral Daily   traZODone  150 mg Oral QHS   Continuous Infusions: PRN Meds:.acetaminophen **OR** acetaminophen, ondansetron **OR** ondansetron (ZOFRAN) IV  Allergies:  Allergies  Allergen Reactions   Haloperidol Anaphylaxis   Aripiprazole Other (See Comments)    Bad thoughts    Chlorpheniramine-Pse-Ibuprofen     Other reaction(s): Cough    Family History  Problem Relation Age of Onset   Hypertension Mother    Heart failure Mother    Hyperlipidemia Mother    Stroke Mother    Hypertension Father    Heart attack Father    COPD Father    Diabetes Paternal Aunt    Diabetes Paternal Grandmother    Colon polyps Other  niece   Stomach cancer Neg Hx    Rectal cancer Neg Hx    Esophageal cancer Neg Hx    Colon cancer Neg Hx     Social History:  reports that she has never smoked. She has never used smokeless tobacco. She reports that she does not currently use alcohol. She reports that she does not use drugs.  ROS: A complete review of systems was performed.  All systems are negative except for pertinent findings as noted.  Physical Exam:  Vital signs in last 24 hours: Temp:  [97.8 F (36.6 C)-98.8 F (37.1 C)] 98.8 F (37.1 C) (07/18 1357) Pulse Rate:  [68-90] 68 (07/18 1357) Resp:  [11-25] 20 (07/18 1357) BP: (104-117)/(47-95) 104/47 (07/18 1357) SpO2:  [92 %-100 %] 98 % (07/18 1357) Weight:  [79.8 kg] 79.8 kg (07/18  0510) GENERAL APPEARANCE:  Well appearing, well developed, well nourished, NAD HEENT:  Atraumatic, normocephalic, oropharynx clear NECK:  Supple without lymphadenopathy or thyromegaly ABDOMEN:  Soft, non-tender, no masses EXTREMITIES:  Moves all extremities well, without clubbing, cyanosis, or edema NEUROLOGIC:  Alert and oriented x 3, CN II-XII grossly intact MENTAL STATUS:  appropriate BACK:  Non-tender to palpation, No CVAT SKIN:  Warm, dry, and intact GU: Stenotic vaginal introitus with retracted urethra  Laboratory Data:  Recent Labs    10/24/21 0518  WBC 7.0  HGB 13.2  HCT 40.6  PLT 182    Recent Labs    10/24/21 0518  NA 132*  K 4.6  CL 94*  GLUCOSE 109*  BUN 26*  CALCIUM 8.4*  CREATININE 1.23*     Results for orders placed or performed during the hospital encounter of 10/24/21 (from the past 24 hour(s))  CBC with Differential/Platelet     Status: Abnormal   Collection Time: 10/24/21  5:18 AM  Result Value Ref Range   WBC 7.0 4.0 - 10.5 K/uL   RBC 4.30 3.87 - 5.11 MIL/uL   Hemoglobin 13.2 12.0 - 15.0 g/dL   HCT 40.6 36.0 - 46.0 %   MCV 94.4 80.0 - 100.0 fL   MCH 30.7 26.0 - 34.0 pg   MCHC 32.5 30.0 - 36.0 g/dL   RDW 13.1 11.5 - 15.5 %   Platelets 182 150 - 400 K/uL   nRBC 0.0 0.0 - 0.2 %   Neutrophils Relative % 84 %   Neutro Abs 5.9 1.7 - 7.7 K/uL   Lymphocytes Relative 7 %   Lymphs Abs 0.5 (L) 0.7 - 4.0 K/uL   Monocytes Relative 8 %   Monocytes Absolute 0.6 0.1 - 1.0 K/uL   Eosinophils Relative 0 %   Eosinophils Absolute 0.0 0.0 - 0.5 K/uL   Basophils Relative 1 %   Basophils Absolute 0.1 0.0 - 0.1 K/uL   Immature Granulocytes 0 %   Abs Immature Granulocytes 0.02 0.00 - 0.07 K/uL  Comprehensive metabolic panel     Status: Abnormal   Collection Time: 10/24/21  5:18 AM  Result Value Ref Range   Sodium 132 (L) 135 - 145 mmol/L   Potassium 4.6 3.5 - 5.1 mmol/L   Chloride 94 (L) 98 - 111 mmol/L   CO2 31 22 - 32 mmol/L   Glucose, Bld 109 (H) 70 -  99 mg/dL   BUN 26 (H) 8 - 23 mg/dL   Creatinine, Ser 1.23 (H) 0.44 - 1.00 mg/dL   Calcium 8.4 (L) 8.9 - 10.3 mg/dL   Total Protein 5.8 (L) 6.5 - 8.1 g/dL   Albumin  3.4 (L) 3.5 - 5.0 g/dL   AST 16 15 - 41 U/L   ALT 13 0 - 44 U/L   Alkaline Phosphatase 68 38 - 126 U/L   Total Bilirubin 0.6 0.3 - 1.2 mg/dL   GFR, Estimated 45 (L) >60 mL/min   Anion gap 7 5 - 15  Troponin I (High Sensitivity)     Status: Abnormal   Collection Time: 10/24/21  5:18 AM  Result Value Ref Range   Troponin I (High Sensitivity) 30 (H) <18 ng/L  Urinalysis, Routine w reflex microscopic Urine, In & Out Cath     Status: None   Collection Time: 10/24/21  5:33 AM  Result Value Ref Range   Color, Urine YELLOW YELLOW   APPearance CLEAR CLEAR   Specific Gravity, Urine 1.012 1.005 - 1.030   pH 5.0 5.0 - 8.0   Glucose, UA NEGATIVE NEGATIVE mg/dL   Hgb urine dipstick NEGATIVE NEGATIVE   Bilirubin Urine NEGATIVE NEGATIVE   Ketones, ur NEGATIVE NEGATIVE mg/dL   Protein, ur NEGATIVE NEGATIVE mg/dL   Nitrite NEGATIVE NEGATIVE   Leukocytes,Ua NEGATIVE NEGATIVE  Troponin I (High Sensitivity)     Status: Abnormal   Collection Time: 10/24/21  7:20 AM  Result Value Ref Range   Troponin I (High Sensitivity) 46 (H) <18 ng/L  Vitamin B12     Status: None   Collection Time: 10/24/21 10:51 AM  Result Value Ref Range   Vitamin B-12 554 180 - 914 pg/mL  Folate     Status: None   Collection Time: 10/24/21 10:51 AM  Result Value Ref Range   Folate 8.7 >5.9 ng/mL  TSH     Status: None   Collection Time: 10/24/21 10:51 AM  Result Value Ref Range   TSH 0.519 0.350 - 4.500 uIU/mL  Ammonia     Status: None   Collection Time: 10/24/21 10:51 AM  Result Value Ref Range   Ammonia 10 9 - 35 umol/L  Blood gas, venous     Status: Abnormal   Collection Time: 10/24/21 10:51 AM  Result Value Ref Range   pH, Ven 7.38 7.25 - 7.43   pCO2, Ven 64 (H) 44 - 60 mmHg   pO2, Ven 32 32 - 45 mmHg   Bicarbonate 37.9 (H) 20.0 - 28.0 mmol/L    Acid-Base Excess 10.1 (H) 0.0 - 2.0 mmol/L   O2 Saturation 52 %   Patient temperature 37.0    Collection site RIGHT ANTECUBITAL    Drawn by [id number]    No results found for this or any previous visit (from the past 240 hour(s)).  Renal Function: Recent Labs    10/24/21 0518  CREATININE 1.23*   Estimated Creatinine Clearance: 36.3 mL/min (A) (by C-G formula based on SCr of 1.23 mg/dL (H)).  Radiologic Imaging: CT HEAD WO CONTRAST (5MM)  Result Date: 10/24/2021 CLINICAL DATA:  Unwitnessed fall with subsequent seizure EXAM: CT HEAD WITHOUT CONTRAST CT CERVICAL SPINE WITHOUT CONTRAST TECHNIQUE: Multidetector CT imaging of the head and cervical spine was performed following the standard protocol without intravenous contrast. Multiplanar CT image reconstructions of the cervical spine were also generated. RADIATION DOSE REDUCTION: This exam was performed according to the departmental dose-optimization program which includes automated exposure control, adjustment of the mA and/or kV according to patient size and/or use of iterative reconstruction technique. COMPARISON:  06/14/2016 FINDINGS: CT HEAD FINDINGS Brain: No evidence of acute infarction, hemorrhage, hydrocephalus, extra-axial collection or mass lesion/mass effect. Chronic small vessel ischemia in the hemispheric  white matter which is extensive. Vascular: No hyperdense vessel or unexpected calcification. Skull: Normal. Negative for fracture or focal lesion. Hyperostosis interna Sinuses/Orbits: Bilateral cataract resection CT CERVICAL SPINE FINDINGS Alignment: No traumatic malalignment Skull base and vertebrae: No acute fracture Soft tissues and spinal canal: No prevertebral fluid or swelling. No visible canal hematoma. Enlarged and nodular thyroid gland without invasive features or significant change since 2018. Dominant nodules are on the right. No follow-up recommended unless clinically warranted (ref: J Am Coll Radiol. 2015 Feb;12(2): 143-50).  Flattening of the trachea in the upper chest. Disc levels:  Ordinary cervical spine degeneration Upper chest: No acute finding. IMPRESSION: No evidence of acute intracranial or cervical spine injury. Electronically Signed   By: Jorje Guild M.D.   On: 10/24/2021 06:34   CT CERVICAL SPINE WO CONTRAST  Result Date: 10/24/2021 CLINICAL DATA:  Unwitnessed fall with subsequent seizure EXAM: CT HEAD WITHOUT CONTRAST CT CERVICAL SPINE WITHOUT CONTRAST TECHNIQUE: Multidetector CT imaging of the head and cervical spine was performed following the standard protocol without intravenous contrast. Multiplanar CT image reconstructions of the cervical spine were also generated. RADIATION DOSE REDUCTION: This exam was performed according to the departmental dose-optimization program which includes automated exposure control, adjustment of the mA and/or kV according to patient size and/or use of iterative reconstruction technique. COMPARISON:  06/14/2016 FINDINGS: CT HEAD FINDINGS Brain: No evidence of acute infarction, hemorrhage, hydrocephalus, extra-axial collection or mass lesion/mass effect. Chronic small vessel ischemia in the hemispheric white matter which is extensive. Vascular: No hyperdense vessel or unexpected calcification. Skull: Normal. Negative for fracture or focal lesion. Hyperostosis interna Sinuses/Orbits: Bilateral cataract resection CT CERVICAL SPINE FINDINGS Alignment: No traumatic malalignment Skull base and vertebrae: No acute fracture Soft tissues and spinal canal: No prevertebral fluid or swelling. No visible canal hematoma. Enlarged and nodular thyroid gland without invasive features or significant change since 2018. Dominant nodules are on the right. No follow-up recommended unless clinically warranted (ref: J Am Coll Radiol. 2015 Feb;12(2): 143-50). Flattening of the trachea in the upper chest. Disc levels:  Ordinary cervical spine degeneration Upper chest: No acute finding. IMPRESSION: No  evidence of acute intracranial or cervical spine injury. Electronically Signed   By: Jorje Guild M.D.   On: 10/24/2021 06:34   DG Chest Port 1 View  Result Date: 10/24/2021 CLINICAL DATA:  Fall. EXAM: PORTABLE CHEST 1 VIEW COMPARISON:  06/18/2019 FINDINGS: 0533 hours. The cardio pericardial silhouette is enlarged. There is pulmonary vascular congestion without overt pulmonary edema. Stable asymmetric elevation right hemidiaphragm stable streaky retrocardiac density compatible with chronic atelectasis or scarring. Bones are diffusely demineralized. Telemetry leads overlie the chest. IMPRESSION: 1. Pulmonary vascular congestion without overt pulmonary edema. 2. Stable asymmetric elevation right hemidiaphragm. Electronically Signed   By: Misty Stanley M.D.   On: 10/24/2021 06:30    I independently reviewed the above imaging studies.  Procedure:  Foley insertion  Under sterile conditions, a 16 French coud catheter was placed under digital guidance with a finger in the vagina.  The catheter was guided into the urethral meatus without direct visualization.  There was return of >700  mL of clear yellow urine.  10 mils of sterile water was placed in the balloon.  The patient tolerated the procedure well.  Impression/Recommendation: Urinary retention Retracted urethral meatus  Continue Foley catheter for several days given over distention of the bladder Consider removal of the Foley catheter once the patient is more ambulatory. Call for any problems.   [name] 10/24/2021, 4:36  PM

## 2021-10-24 NOTE — ED Notes (Signed)
Pt's POA, Heather called to get an update on pt condition.  This RN informed her pt is alert and stable and has a bed ready  Pt informed that her niece, Nira Conn called to check on her.  Pt verbalized understanding, when this RN asked if pt has any pain or complaints pt states she is hungry, will address

## 2021-10-24 NOTE — Assessment & Plan Note (Addendum)
Reduced dose of risperdal to 1 mg daily Recommend patient follow up with her psychiatrist outpatient to discuss ongoing use of risperdal

## 2021-10-24 NOTE — Consult Note (Signed)
I connected with  [name] on 10/24/21 by a video enabled telemedicine application and verified that I am speaking with the correct person using two identifiers.   I discussed the limitations of evaluation and management by telemedicine. The patient expressed understanding and agreed to proceed.  Location of patient: [facility] Location of physician: [facility]  Neurology Consultation Reason for Consult: [place] Referring Physician: Dr. Shanon Brow Tat  CC: Jerking,   History is obtained from: Patient, chart review  HPI: [name] is a 81 y.o. 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  Patient states she got up to use the bathroom and tripped and fell.  However when EMS arrived, they noticed brief sudden whole-body jerking as well as altered mental status.  EMS administered Versed after which the jerking subsided.  Patient was evaluated by medicine team and noted to have whole-body jerking again.  Therefore and neurology was consulted for further evaluation of jerking movements.  Patient denies recent illness, prior episodes of jerking, new medications.  She does report right upper extremity tremor for more than 2 years but denies a diagnosis of parkinsonism.  Of note, patient is a poor historian, states the nursing home staff helps her with all her medications and the nursing home physician prescribes medications.  Does not remember seeing any other doctors in the recent past.  Denies prior history of epilepsy/seizures.  ROS: All other systems reviewed and negative except as noted in the HPI.   Past Medical History:  Diagnosis Date   Anemia 1990   hx of   Anxiety    Arthritis 2004   Asthma 1995   uses inhaler   Chronic kidney disease 2010   LEFT kidney removed   Colon polyp    Complication of anesthesia 1966    first surgery she had- had a hard time waking up from surgery   Diverticulosis 2003   GERD (gastroesophageal reflux disease)    on meds    Family History  Problem Relation Age of Onset   Hypertension Mother    Heart failure Mother    Hyperlipidemia Mother    Stroke Mother    Hypertension Father    Heart attack Father    COPD Father    Diabetes Paternal Aunt    Diabetes Paternal Grandmother    Colon polyps Other        niece   Stomach cancer Neg Hx    Rectal cancer Neg Hx    Esophageal cancer Neg Hx    Colon cancer Neg Hx     Social History:  reports that she has never smoked. She has never used smokeless tobacco. She reports that she does not currently use alcohol. She reports that she does not use drugs.  Medications Prior to Admission  Medication Sig Dispense Refill Last Dose   acetaminophen (TYLENOL) 325 MG tablet Take 650 mg by mouth every 6 (six) hours as needed.   UNK   amantadine (SYMMETREL) 100 MG capsule Take 100 mg by mouth 2 (two) times daily.   10/23/2021   amoxicillin (AMOXIL) 500 MG capsule Take 2,000 mg by mouth as directed. 1 hour before treatment   UNK   BREO ELLIPTA 200-25 MCG/INH AEPB Inhale 1 puff into the lungs daily.   10/23/2021   cetirizine (ZYRTEC) 10 MG tablet Take 10 mg by mouth daily.  10/23/2021   escitalopram (LEXAPRO) 10 MG tablet Take 10 mg by mouth daily.   10/23/2021   fluticasone (FLONASE) 50 MCG/ACT nasal spray Place 2 sprays into both nostrils daily.   10/23/2021   furosemide (LASIX) 20 MG tablet Take 20 mg by mouth 2 (two) times daily.   10/23/2021   gabapentin (NEURONTIN) 100 MG capsule Take 100 mg by mouth at bedtime.   10/23/2021   ibuprofen (ADVIL) 800 MG tablet Take 800 mg by mouth every 8 (eight) hours as needed for mild pain.   10/20/2021   Ipratropium-Albuterol (COMBIVENT IN) Inhale 1 puff into the lungs every 6 (six) hours as needed (wheezing).   UNK   loperamide (IMODIUM) 2 MG capsule Take 4 mg by mouth as needed for diarrhea or loose  stools.   UNK   losartan (COZAAR) 25 MG tablet TAKE 1 TABLET BY MOUTH DAILY (Patient taking differently: Take 25 mg by mouth daily.) 90 tablet 3 10/23/2021   melatonin 3 MG TABS tablet Take 3 mg by mouth at bedtime.   10/23/2021   Multiple Vitamins-Minerals (PRESERVISION AREDS) TABS Take 1 tablet by mouth in the morning and at bedtime.   10/23/2021   omeprazole (PRILOSEC) 20 MG capsule Take 1 capsule (20 mg total) by mouth daily. 30 capsule 0 10/23/2021   risperiDONE (RISPERDAL) 2 MG tablet Take 2 mg by mouth 2 (two) times daily.   10/23/2021   traZODone (DESYREL) 150 MG tablet Take 150 mg by mouth at bedtime.   10/23/2021      Exam: Current vital signs: BP (!) 109/58 (BP Location: Right Arm)   Pulse 76   Temp 98.6 F (37 C) (Oral)   Resp 20   Ht '5\' 2"'$  (1.575 m)   Wt 79.8 kg   SpO2 95%   BMI 32.18 kg/m  Vital signs in last 24 hours: Temp:  [97.8 F (36.6 C)-98.6 F (37 C)] 98.6 F (37 C) (07/18 0924) Pulse Rate:  [74-90] 76 (07/18 0924) Resp:  [11-25] 20 (07/18 0924) BP: (105-117)/(50-95) 109/58 (07/18 0924) SpO2:  [92 %-100 %] 95 % (07/18 0924) Weight:  [79.8 kg] 79.8 kg (07/18 0510)   Physical Exam  Constitutional: Appears well-developed and well-nourished.  Psych: Affect appropriate to situation Eyes: No scleral injection Neuro: AAO x3, no aphasia, cranial nerves appear grossly intact, antigravity strength in all 4 extremities without drift, brief whole body jerks which appear to be consistent with myoclonus   I have reviewed labs in epic and the results pertinent to this consultation are: CBC:  Recent Labs  Lab 10/24/21 0518  WBC 7.0  NEUTROABS 5.9  HGB 13.2  HCT 40.6  MCV 94.4  PLT 284    Basic Metabolic Panel:  Lab Results  Component Value Date   NA 132 (L) 10/24/2021   K 4.6 10/24/2021   CO2 31 10/24/2021   GLUCOSE 109 (H) 10/24/2021   BUN 26 (H) 10/24/2021   CREATININE 1.23 (H) 10/24/2021   CALCIUM 8.4 (L) 10/24/2021   GFRNONAA 45 (L) 10/24/2021    GFRAA 52 (L) 06/18/2019   Lipid Panel:  Lab Results  Component Value Date   LDLCALC 68 10/29/2016   HgbA1c: No results found for: "HGBA1C" Urine Drug Screen:     Component Value Date/Time   LABOPIA NONE DETECTED 09/17/2017 2029   COCAINSCRNUR NONE DETECTED 09/17/2017 2029   LABBENZ NONE DETECTED 09/17/2017 2029   AMPHETMU NONE DETECTED 09/17/2017 2029   THCU NONE DETECTED 09/17/2017 2029   LABBARB NONE DETECTED  09/17/2017 2029    Alcohol Level     Component Value Date/Time   ETH <10 09/17/2017 2004     I have reviewed the images obtained:  CT head and CT cervical spine without contrast 10/24/2021: No evidence of acute intracranial or cervical spine injury.    ASSESSMENT/PLAN: 81 year-old female who presented with fall and was noted to have full body jerking consistent with myoclonus.  Myoclonus -Differentials include myoclonus due to gabapentin toxicity in the setting of renal dysfunction versus less likely epileptic myoclonus  Recommendations: -Hold gabapentin. Patient is on low-dose (100 mg nightly). Therefore may not need to resume unless patient complains of significant neuropathic pain which is not controlled with a different medication -EEG ordered and pending -We will avoid starting AEDs unless EEG shows ictal-interictal abnormality -Okay to use clonazepam 0.25-0.5 mg if the jerking movements is interfering with patient's ability to eat -Agree with holding risperidone for now.  Will defer to psychiatry whether patient's movements are consistent with drug-induced myoclonus or not. -Defer management of rest of comorbidities per primary team -Discussed plan with patient, Dr. Carles Collet   Thank you for allowing [place] to participate in the care of this patient. If you have any further questions, please contact  me or neurohospitalist.   Zeb [facility] neurohospitalist

## 2021-10-24 NOTE — ED Triage Notes (Signed)
Pt arrives BIB [facility] from [place] had unwitnessed fall tonight then began to have seizures. Pt denied hx of seizure. Pt given '2mg'$  IV versed by EMS.

## 2021-10-24 NOTE — Assessment & Plan Note (Addendum)
Reported rhythmic myoclonic jerks of the upper body, primarily left upper extremity seem to have resolved.  --Neurology consult appreciated.  Abnormal movements seem to have resolved now.  -defer AEDs per neurology consult  -d/c gabapentin -REPEAT EEG: no epileptiform activity seen -recommend patient follow up with psychiatrist to discuss risperdal as possible cause of her myoclonic jerks

## 2021-10-24 NOTE — ED Notes (Signed)
Patient transported to CT 

## 2021-10-24 NOTE — Assessment & Plan Note (Signed)
Continue lexapro  ?

## 2021-10-25 DIAGNOSIS — G253 Myoclonus: Secondary | ICD-10-CM

## 2021-10-25 DIAGNOSIS — G9341 Metabolic encephalopathy: Secondary | ICD-10-CM

## 2021-10-25 DIAGNOSIS — I10 Essential (primary) hypertension: Secondary | ICD-10-CM

## 2021-10-25 DIAGNOSIS — R778 Other specified abnormalities of plasma proteins: Secondary | ICD-10-CM

## 2021-10-25 DIAGNOSIS — R7989 Other specified abnormal findings of blood chemistry: Secondary | ICD-10-CM | POA: Diagnosis present

## 2021-10-25 DIAGNOSIS — R55 Syncope and collapse: Secondary | ICD-10-CM | POA: Diagnosis not present

## 2021-10-25 LAB — COMPREHENSIVE METABOLIC PANEL
ALT: 13 U/L (ref 0–44)
AST: 15 U/L (ref 15–41)
Albumin: 3.5 g/dL (ref 3.5–5.0)
Alkaline Phosphatase: 68 U/L (ref 38–126)
Anion gap: 7 (ref 5–15)
BUN: 22 mg/dL (ref 8–23)
CO2: 31 mmol/L (ref 22–32)
Calcium: 8.8 mg/dL — ABNORMAL LOW (ref 8.9–10.3)
Chloride: 95 mmol/L — ABNORMAL LOW (ref 98–111)
Creatinine, Ser: 1 mg/dL (ref 0.44–1.00)
GFR, Estimated: 57 mL/min — ABNORMAL LOW (ref >60)
Glucose, Bld: 91 mg/dL (ref 70–99)
Potassium: 4.3 mmol/L (ref 3.5–5.1)
Sodium: 133 mmol/L — ABNORMAL LOW (ref 135–145)
Total Bilirubin: 1 mg/dL (ref 0.3–1.2)
Total Protein: 5.7 g/dL — ABNORMAL LOW (ref 6.5–8.1)

## 2021-10-25 LAB — CBC
HCT: 41 % (ref 36.0–46.0)
Hemoglobin: 13.6 g/dL (ref 12.0–15.0)
MCH: 30.8 pg (ref 26.0–34.0)
MCHC: 33.2 g/dL (ref 30.0–36.0)
MCV: 92.8 fL (ref 80.0–100.0)
Platelets: 181 10*3/uL (ref 150–400)
RBC: 4.42 MIL/uL (ref 3.87–5.11)
RDW: 12.6 % (ref 11.5–15.5)
WBC: 6.8 10*3/uL (ref 4.0–10.5)
nRBC: 0 % (ref 0.0–0.2)

## 2021-10-25 MED ORDER — CHLORHEXIDINE GLUCONATE CLOTH 2 % EX PADS
6.0000 | MEDICATED_PAD | Freq: Every day | CUTANEOUS | Status: DC
  Administered 2021-10-25 – 2021-10-27 (×3): 6 via TOPICAL

## 2021-10-25 NOTE — Care Management Obs Status (Signed)
MEDICARE OBSERVATION STATUS NOTIFICATION   Patient Details  Name: [name] MRN: [id number] Date of Birth: Jun 26, 1940   Medicare Observation Status Notification Given:  Yes    Tommy Medal 10/25/2021, 9:18 AM

## 2021-10-25 NOTE — Assessment & Plan Note (Addendum)
-  she just had a Thyroid Ultrasound 06/08/21 and a Thyroid FNA 06/22/21, and is scheduled for her 6 month follow up in October.

## 2021-10-25 NOTE — Progress Notes (Signed)
I connected with  [name] on 10/24/21 by a video enabled telemedicine application and verified that I am speaking with the correct person using two identifiers.   I discussed the limitations of evaluation and management by telemedicine. The patient expressed understanding and agreed to proceed.   Location of patient: [facility] Location of physician: [facility]   Subjective: NAEO. Jerking has resolved  ROS: negative except above  Examination  Vital signs in last 24 hours: Temp:  [98.2 F (36.8 C)-98.7 F (37.1 C)] 98.7 F (37.1 C) (07/19 1403) Pulse Rate:  [71-77] 77 (07/19 1403) Resp:  [14-20] 19 (07/19 1403) BP: (81-124)/(47-68) 81/47 (07/19 1403) SpO2:  [93 %-99 %] 97 % (07/19 1403)  General: lying in bed NAD Neuro: MS: Alert, oriented, follows commands CN: pupils equal and reactive,  EOMI, face symmetric, tongue midline, normal sensation over face, Motor: antigravity strength in all 4 extremities without drift, subtle right upper extremity resting and action tremorxor Coordination: normal Gait: not tested  Basic Metabolic Panel: Recent Labs  Lab 10/24/21 0518 10/25/21 0559  NA 132* 133*  K 4.6 4.3  CL 94* 95*  CO2 31 31  GLUCOSE 109* 91  BUN 26* 22  CREATININE 1.23* 1.00  CALCIUM 8.4* 8.8*    CBC: Recent Labs  Lab 10/24/21 0518 10/25/21 0559  WBC 7.0 6.8  NEUTROABS 5.9  --   HGB 13.2 13.6  HCT 40.6 41.0  MCV 94.4 92.8  PLT 182 181     Coagulation Studies: No results for input(s): "LABPROT", "INR" in the last 72 hours.  Imaging No new imaging  ASSESSMENT AND PLAN: 81 year-old female who presented with fall and was noted to have full body jerking consistent with myoclonus.   Myoclonus -Differentials include myoclonus due to gabapentin toxicity in the setting of renal dysfunction versus less likely epileptic myoclonus   Recommendations: -Hold gabapentin. Patient is on low-dose (100 mg nightly). Therefore may not need  to resume unless patient complains of significant neuropathic pain which is not controlled with a different medication -EEG yesterday showed sharp transients, repeat EEG ordered and pending -We will avoid starting AEDs unless EEG shows definite ictal-interictal abnormality -Agree with holding risperidone for now.  Will defer to psychiatry whether patient's movements are consistent with drug-induced myoclonus or not. -Defer management of rest of comorbidities per primary team -Discussed plan with patient  I have spent a total of  27 minutes with the patient reviewing hospital notes,  test results, labs and examining the patient as well as establishing an assessment and plan that was discussed personally with the patient.  > 50% of time was spent in direct patient care.    Zeb Comfort Epilepsy [facility] For questions after 5pm please refer to AMION to reach the Neurologist on call

## 2021-10-25 NOTE — TOC Initial Note (Signed)
Transition of Care [facility]) - Initial/Assessment Note    Patient Details  Name: [name] MRN: [id number] Date of Birth: [birth year]  Transition of Care [facility]) CM/SW Contact:    Boneta Lucks, RN Phone Number: 10/25/2021, 2:41 PM  Clinical Narrative:     Patient admitted with acute metabolic encephalopathy. Patient is from [facility]F. Patient will be medically ready to discharge back to ALF tomorrow. Patient will need indwelling cath for 1 week then follow up with Urology. TOC spoke with Trudy. They will accept her back with urinary catheter.  She is asking for [facility] and discharge summary. Aram Beecham states patient was weak when she came to the hospital, she is not sure they should pick her up and may need EMS to transport back to ALF. TOC to follow.         Expected Discharge Plan: Home/Self Care Barriers to Discharge: Continued Medical Work up   Patient Goals and CMS Choice   CMS Medicare.gov Compare Post Acute Care list provided to:: Patient Choice offered to / list presented to : Patient  Expected Discharge Plan and Services Expected Discharge Plan: Home/Self Care    Living arrangements for the past 2 months: [place]    Prior Living Arrangements/Services Living arrangements for the past 2 months: [place] Lives with:: Facility Resident Patient language and need for interpreter reviewed:: Yes Do you feel safe going back to the place where you live?: Yes      Need for Family Participation in Patient Care: Yes (Comment) Care giver support system in place?: Yes (comment)   Criminal Activity/Legal Involvement Pertinent to Current Situation/Hospitalization: No - Comment as needed  Activities of Daily Living Home Assistive Devices/Equipment: None ADL Screening (condition at time of admission) Patient's cognitive ability adequate to safely complete daily activities?: Yes Is the patient deaf or have difficulty hearing?: Yes Does the patient  have difficulty seeing, even when wearing glasses/contacts?: No Does the patient have difficulty concentrating, remembering, or making decisions?: No Patient able to express need for assistance with ADLs?: Yes Does the patient have difficulty dressing or bathing?: Yes Independently performs ADLs?: No Communication: Independent Dressing (OT): Dependent Is this a change from baseline?: Pre-admission baseline Grooming: Dependent Is this a change from baseline?: Pre-admission baseline Feeding: Independent Bathing: Dependent Is this a change from baseline?: Pre-admission baseline Toileting: Dependent Is this a change from baseline?: Pre-admission baseline In/Out Bed: Needs assistance Is this a change from baseline?: Pre-admission baseline Does the patient have difficulty walking or climbing stairs?: Yes Weakness of Legs: Both Weakness of Arms/Hands: None  Permission Sought/Granted      Share Information with NAME: Aram Beecham  Permission granted to share info w AGENCY: ALF    Emotional Assessment      Alcohol / Substance Use: Not Applicable Psych Involvement: No (comment)  Admission diagnosis:  Syncope, unspecified syncope type [P50] Acute metabolic encephalopathy [D32.67] Patient Active Problem List   Diagnosis Date Noted   Acute metabolic encephalopathy 12/45/8099   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   Elevated troponin 10/24/2021   Pain due to onychomycosis of toenails of both feet 09/12/2020   Chronic low back pain without sciatica 10/02/2017   Essential hypertension 10/02/2017   Paranoid schizophrenia ([place]) 09/19/2017   Delusional disorder, 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[place]) 09/18/2017   Behavioral change 09/18/2017   Severe manic bipolar 1 disorder with psychotic behavior ([place]) 08/01/2017   History of left inguinal hernia repair 08/06/2016   H/O total knee replacement, left 06/19/2016   Primary osteoarthritis of  left knee 04/04/2016   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/2016   Anxiety 11/15/2015    Gastroesophageal reflux disease without esophagitis 11/15/2015   Solitary right kidney 07/07/2015   Urge incontinence of urine 07/07/2015   Asthma 12/07/2011   Arthritis 12/07/2011   Thoracic spondylosis - mild to moderate 2009 12/07/2011   Obesity  12/07/2011   Incisional hernia 03/29/2011   DIVERTICULOSIS-COLON 12/01/2008   ABDOMINAL PAIN-RLQ 12/01/2008   PCP:  Lubertha Sayres, FNP Pharmacy:   [id number] [place], [place]Phone: [phone]: [address] [address]Phone: [phone]: [phone]  Readmission Risk Interventions     No data to display

## 2021-10-25 NOTE — Progress Notes (Addendum)
PROGRESS NOTE   [name]  DXA:[id number] DOB: 06-Jun-1940 DOA: 10/24/2021 PCP: Lubertha Sayres, FNP   Chief Complaint  Patient presents with   Seizures   Fall   Level of care: Telemetry  Brief Admission History:  81 year old female with a history of CKD stage II, diverticulosis, GERD, hypertension, anxiety, and LV dysfunction with EF 25% presenting from [facility] with a reported unwitnessed fall.  Apparently, the patient was found on the ground at her facility.  Apparently the patient was awake, but was noted to have seizure-like and jerking-like activity.  EMS reported that the patient would have intermittent episodes followed by episodes of somnolence.  EMS gave the patient Versed which apparently suppressed episodes.  Upon arrival to the ED, EDP did not note any jerking or seizure-like activity.  However the patient was somnolent. At the time of my evaluation, the patient is awake and alert.  She is a poor historian but able to answer some simple questions.  She does not recall the events prior to arrival, but knows that she is at [facility] and able to follow simple commands.  She denies any headache, chest pain, shortness of breath, nausea, vomiting, diarrhea, abdominal pain.  Remainder of the review of systems is limited secondary to her somnolence. In the ED, the patient was afebrile and hemodynamically stable albeit with soft blood pressures.  Oxygen saturation was 100% on 3 L.  CT of the brain was negative for acute findings.  CT of the cervical spine was negative for any subluxation or fracture.  Chest x-ray showed pulmonary vascular congestion.  WBC 7.0, hemoglobin 13.2, platelets 1 82,000.  Sodium 132, potassium 4.6, bicarbonate 31, BUN 26, creatinine 1.23.  LFTs were unremarkable.  EKG showed sinus rhythm with left bundle branch block.  Troponin was 30.  Notably, the patient was recently seen by cardiology on 09/06/2021, Dr. Golden Hurter for LV dysfunction.  Apparently  an outpatient echocardiogram showed an EF of 25%.  The patient was scheduled for stress test.  With further recommendations to follow after the stress test.   Assessment and Plan: * Acute metabolic encephalopathy Patient was found awake and somnolent from unwitnessed fall TSH-0.519, MC9-4.70 [id number] Folic acid - 8.7 [id number] UA negative for pyuria Minimize psychotropic and hypnotic medications seems to be working  hyperthyroidism  -she just had a Thyroid Ultrasound 06/08/21 and a Thyroid FNA 06/22/21, and is scheduled for her 6 month follow up in October.   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  --Neurology consult appreciated -EEG being repeated today 7/19 -defer AEDs per neurology consult pending repeat EEG -d/c gabapentin  Severe manic bipolar 1 disorder with psychotic behavior ([place]) Holding risperdal for now, pt will need to follow up with her psychiatrist outpatient   Essential hypertension Hold losartan for soft BPs  Anxiety Continue lexapro  Obesity  BMI 32.18 -lifestyle modification   DVT prophylaxis: enoxaparin  Code Status: full  Family Communication:  Disposition: Status is: Observation The patient remains OBS appropriate and will d/c before 2 midnights.   Consultants:  Neurology  Procedures:  EEG - 7/18, 7/19  Antimicrobials:    Subjective: Pt reports that the abnormal movements seem to have gotten much better.   Objective: Vitals:   10/24/21 2116 10/25/21 0647 10/25/21 0922 10/25/21 1403  BP: 124/68 (!) 111/59  Marland Kitchen)  81/47  Pulse: 72 71  77  Resp: '14 16  19  '$ Temp: 98.2 F (36.8 C) 98.2 F (36.8 C)  98.7 F (37.1 C)  TempSrc:    Oral  SpO2: 99% 95% 93% 97%  Weight:      Height:        Intake/Output Summary (Last 24 hours) at 10/25/2021 1653 Last data filed at  10/25/2021 1640 Gross per 24 hour  Intake 720 ml  Output 3700 ml  Net -2980 ml   Filed Weights   2021-11-05 0510  Weight: 79.8 kg   Examination:  General exam: Appears calm and comfortable  Respiratory system: Clear to auscultation. Respiratory effort normal. Cardiovascular system: normal S1 & S2 heard. No JVD, murmurs, rubs, gallops or clicks. No pedal edema. Gastrointestinal system: Abdomen is nondistended, soft and nontender. No organomegaly or masses felt. Normal bowel sounds heard. Central nervous system: Alert and oriented. No focal neurological deficits. Extremities: Symmetric 5 x 5 power. Skin: No rashes, lesions or ulcers. Psychiatry: Judgement and insight appear normal. Mood & affect appropriate.   Data Reviewed: I have personally reviewed following labs and imaging studies  CBC: Recent Labs  Lab 11-05-2021 0518 10/25/21 0559  WBC 7.0 6.8  NEUTROABS 5.9  --   HGB 13.2 13.6  HCT 40.6 41.0  MCV 94.4 92.8  PLT 182 681    Basic Metabolic Panel: Recent Labs  Lab 2021/11/05 0518 10/25/21 0559  NA 132* 133*  K 4.6 4.3  CL 94* 95*  CO2 31 31  GLUCOSE 109* 91  BUN 26* 22  CREATININE 1.23* 1.00  CALCIUM 8.4* 8.8*    CBG: No results for input(s): "GLUCAP" in the last 168 hours.  No results found for this or any previous visit (from the past 240 hour(s)).   Radiology Studies: EEG adult  Result Date: 11-05-2021 Lora Havens, MD     10/25/2021  8:26 AM Patient Name: [name] MRN: [id number] Epilepsy Attending: Lora Havens Referring Physician/Provider: Lora Havens, MD Date: Nov 05, 2021 Duration: 22.21 mins Patient history: 81 year-old female who presented with fall and was noted to have full body jerking consistent with myoclonus. EEG to evaluate for seizure Level of alertness: Awake AEDs during EEG study: None Technical aspects: This EEG study was done with scalp electrodes positioned according to the 10-20 International system of electrode  placement. Electrical activity was acquired at a sampling rate of '500Hz'$  and reviewed with a high frequency filter of '70Hz'$  and a low frequency filter of '1Hz'$ . EEG data were recorded continuously and digitally stored. Description: The posterior dominant rhythm consists of 9 Hz activity of moderate voltage (25-35 uV) seen predominantly in posterior head regions, symmetric and reactive to eye opening and eye closing. Generalized sharp transient were noted which appeared artifactual but cannot definitively rule out epileptiform nature. Hyperventilation and photic stimulation were not performed.   IMPRESSION: This study showed generalized sharp transients which are most likely artifactual but cannot definitively rule out epileptiform nature. Consider repeat EEG for further evaluation. No seizures were seen throughout the recording. [name]   CT HEAD WO CONTRAST (5MM)  Result Date: 11/05/2021 CLINICAL DATA:  Unwitnessed fall with subsequent seizure EXAM: CT HEAD WITHOUT CONTRAST CT CERVICAL SPINE WITHOUT CONTRAST TECHNIQUE: Multidetector CT imaging of the head and cervical spine was performed following the standard protocol without intravenous contrast. Multiplanar CT image reconstructions of the cervical spine were also generated. RADIATION DOSE REDUCTION: This exam was performed according to the departmental  dose-optimization program which includes automated exposure control, adjustment of the mA and/or kV according to patient size and/or use of iterative reconstruction technique. COMPARISON:  06/14/2016 FINDINGS: CT HEAD FINDINGS Brain: No evidence of acute infarction, hemorrhage, hydrocephalus, extra-axial collection or mass lesion/mass effect. Chronic small vessel ischemia in the hemispheric white matter which is extensive. Vascular: No hyperdense vessel or unexpected calcification. Skull: Normal. Negative for fracture or focal lesion. Hyperostosis interna Sinuses/Orbits: Bilateral cataract resection CT  CERVICAL SPINE FINDINGS Alignment: No traumatic malalignment Skull base and vertebrae: No acute fracture Soft tissues and spinal canal: No prevertebral fluid or swelling. No visible canal hematoma. Enlarged and nodular thyroid gland without invasive features or significant change since 2018. Dominant nodules are on the right. No follow-up recommended unless clinically warranted (ref: J Am Coll Radiol. 2015 Feb;12(2): 143-50). Flattening of the trachea in the upper chest. Disc levels:  Ordinary cervical spine degeneration Upper chest: No acute finding. IMPRESSION: No evidence of acute intracranial or cervical spine injury. Electronically Signed   By: Jorje Guild M.D.   On: 10/24/2021 06:34   CT CERVICAL SPINE WO CONTRAST  Result Date: 10/24/2021 CLINICAL DATA:  Unwitnessed fall with subsequent seizure EXAM: CT HEAD WITHOUT CONTRAST CT CERVICAL SPINE WITHOUT CONTRAST TECHNIQUE: Multidetector CT imaging of the head and cervical spine was performed following the standard protocol without intravenous contrast. Multiplanar CT image reconstructions of the cervical spine were also generated. RADIATION DOSE REDUCTION: This exam was performed according to the departmental dose-optimization program which includes automated exposure control, adjustment of the mA and/or kV according to patient size and/or use of iterative reconstruction technique. COMPARISON:  06/14/2016 FINDINGS: CT HEAD FINDINGS Brain: No evidence of acute infarction, hemorrhage, hydrocephalus, extra-axial collection or mass lesion/mass effect. Chronic small vessel ischemia in the hemispheric white matter which is extensive. Vascular: No hyperdense vessel or unexpected calcification. Skull: Normal. Negative for fracture or focal lesion. Hyperostosis interna Sinuses/Orbits: Bilateral cataract resection CT CERVICAL SPINE FINDINGS Alignment: No traumatic malalignment Skull base and vertebrae: No acute fracture Soft tissues and spinal canal: No  prevertebral fluid or swelling. No visible canal hematoma. Enlarged and nodular thyroid gland without invasive features or significant change since 2018. Dominant nodules are on the right. No follow-up recommended unless clinically warranted (ref: J Am Coll Radiol. 2015 Feb;12(2): 143-50). Flattening of the trachea in the upper chest. Disc levels:  Ordinary cervical spine degeneration Upper chest: No acute finding. IMPRESSION: No evidence of acute intracranial or cervical spine injury. Electronically Signed   By: Jorje Guild M.D.   On: 10/24/2021 06:34   DG Chest Port 1 View  Result Date: 10/24/2021 CLINICAL DATA:  Fall. EXAM: PORTABLE CHEST 1 VIEW COMPARISON:  06/18/2019 FINDINGS: 0533 hours. The cardio pericardial silhouette is enlarged. There is pulmonary vascular congestion without overt pulmonary edema. Stable asymmetric elevation right hemidiaphragm stable streaky retrocardiac density compatible with chronic atelectasis or scarring. Bones are diffusely demineralized. Telemetry leads overlie the chest. IMPRESSION: 1. Pulmonary vascular congestion without overt pulmonary edema. 2. Stable asymmetric elevation right hemidiaphragm. Electronically Signed   By: Misty Stanley M.D.   On: 10/24/2021 06:30    Scheduled Meds:  Chlorhexidine Gluconate Cloth  6 each Topical Daily   enoxaparin (LOVENOX) injection  40 mg Subcutaneous Q24H   escitalopram  10 mg Oral Daily   fluticasone furoate-vilanterol  1 puff Inhalation Daily   furosemide  20 mg Oral BID WC   melatonin  3 mg Oral QHS   pantoprazole  40 mg Oral Daily  traZODone  150 mg Oral QHS   Continuous Infusions:   LOS: 0 days   Time spent: 35 mins  Ayden Apodaca Wynetta Emery, MD How to contact the [facility] Attending or Consulting provider [place] or covering provider during after hours [place], for this patient?  Check the care team in [facility] and look for a) attending/consulting TR[facility] provider listed and b) the [facility] team listed Log into [email or web address] and use  [place]'s universal password to access. If you do not have the password, please contact the hospital operator. Locate the [facility] provider you are looking for under [facility] and page to a number that you can be directly reached. If you still have difficulty reaching the provider, please page the [facility] (Director on Call) for the Hospitalists listed on amion for assistance.  10/25/2021, 4:53 PM

## 2021-10-26 ENCOUNTER — Observation Stay (HOSPITAL_COMMUNITY)
Admit: 2021-10-26 | Discharge: 2021-10-26 | Disposition: A | Discharge status: Home / Self Care | Payer: Medicare Other | Attending: Internal Medicine | Admitting: Internal Medicine

## 2021-10-26 DIAGNOSIS — G253 Myoclonus: Secondary | ICD-10-CM | POA: Diagnosis not present

## 2021-10-26 DIAGNOSIS — G9341 Metabolic encephalopathy: Secondary | ICD-10-CM | POA: Diagnosis not present

## 2021-10-26 DIAGNOSIS — R4182 Altered mental status, unspecified: Secondary | ICD-10-CM

## 2021-10-26 DIAGNOSIS — R55 Syncope and collapse: Secondary | ICD-10-CM | POA: Diagnosis not present

## 2021-10-26 MED ORDER — LORAZEPAM 1 MG PO TABS
1.0000 mg | ORAL_TABLET | Freq: Once | ORAL | Status: DC
Start: 2021-10-26 — End: 2021-10-27

## 2021-10-26 MED ORDER — LORAZEPAM 2 MG/ML IJ SOLN: 1.0000 mg | Freq: Once | INTRAMUSCULAR | Status: DC

## 2021-10-26 NOTE — Progress Notes (Signed)
PROGRESS NOTE   [name]  ATF:[id number] DOB: 1940-10-02 DOA: 10/24/2021 PCP: Lubertha Sayres, FNP   Chief Complaint  Patient presents with   Seizures   Fall   Level of care: Telemetry  Brief Admission History:  81 year old female with a history of CKD stage II, diverticulosis, GERD, hypertension, anxiety, and LV dysfunction with EF 25% presenting from [facility] with a reported unwitnessed fall.  Apparently, the patient was found on the ground at her facility.  Apparently the patient was awake, but was noted to have seizure-like and jerking-like activity.  EMS reported that the patient would have intermittent episodes followed by episodes of somnolence.  EMS gave the patient Versed which apparently suppressed episodes.  Upon arrival to the ED, EDP did not note any jerking or seizure-like activity.  However the patient was somnolent. At the time of my evaluation, the patient is awake and alert.  She is a poor historian but able to answer some simple questions.  She does not recall the events prior to arrival, but knows that she is at [facility] and able to follow simple commands.  She denies any headache, chest pain, shortness of breath, nausea, vomiting, diarrhea, abdominal pain.  Remainder of the review of systems is limited secondary to her somnolence. In the ED, the patient was afebrile and hemodynamically stable albeit with soft blood pressures.  Oxygen saturation was 100% on 3 L.  CT of the brain was negative for acute findings.  CT of the cervical spine was negative for any subluxation or fracture.  Chest x-ray showed pulmonary vascular congestion.  WBC 7.0, hemoglobin 13.2, platelets 1 82,000.  Sodium 132, potassium 4.6, bicarbonate 31, BUN 26, creatinine 1.23.  LFTs were unremarkable.  EKG showed sinus rhythm with left bundle branch block.  Troponin was 30.  Notably, the patient was recently seen by cardiology on 09/06/2021, Dr. Golden Hurter for LV dysfunction.  Apparently  an outpatient echocardiogram showed an EF of 25%.  The patient was scheduled for stress test.  With further recommendations to follow after the stress test.   Assessment and Plan: * Acute metabolic encephalopathy Patient was found awake and somnolent from unwitnessed fall TSH-0.519, YH0-6.23 [id number] Folic acid - 8.7 [id number] UA negative for pyuria Minimize psychotropic and hypnotic medications seems to be working  hyperthyroidism  -she just had a Thyroid Ultrasound 06/08/21 and a Thyroid FNA 06/22/21, and is scheduled for her 6 month follow up in October.   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  --Neurology consult appreciated -EEG being repeated today 7/19 -defer AEDs per neurology consult pending repeat EEG -d/c gabapentin -REPEAT EEG STILL PENDING, this has delayed discharge today as her ALF will not accept after 3 pm   Severe manic bipolar 1 disorder with psychotic behavior ([place]) Holding risperdal for now, pt will need to follow up with her psychiatrist outpatient   Essential hypertension Hold losartan for soft BPs  Anxiety Continue lexapro  Obesity  BMI 32.18 -lifestyle modification   DVT prophylaxis: enoxaparin  Code Status: full  Family Communication:  Disposition: Status is: Observation The patient remains OBS appropriate and will d/c before 2 midnights.   Consultants:  Neurology  Procedures:  EEG - 7/18, 7/19  Antimicrobials:    Subjective: Pt reports food is terrible here and wants to get out of here  Objective: Vitals:   10/25/21 2119 10/26/21 0543 10/26/21 0750 10/26/21 1306  BP: 138/81 136/62  113/60  Pulse: 76 85 68 88  Resp: '18 18 16 18  '$ Temp: 98.1 F (36.7 C) 98.1 F (36.7 C)  98.5 F (36.9 C)  TempSrc:    Oral  SpO2: 96% 96% 98% 92%  Weight:      Height:         Intake/Output Summary (Last 24 hours) at 10/26/2021 1428 Last data filed at 10/26/2021 1300 Gross per 24 hour  Intake 960 ml  Output 2400 ml  Net -1440 ml   Filed Weights   2021/11/18 0510  Weight: 79.8 kg   Examination:  General exam: Appears calm and comfortable  Respiratory system: Clear to auscultation. Respiratory effort normal. Cardiovascular system: normal S1 & S2 heard. No JVD, murmurs, rubs, gallops or clicks. No pedal edema. Gastrointestinal system: Abdomen is nondistended, soft and nontender. No organomegaly or masses felt. Normal bowel sounds heard. Central nervous system: Alert and oriented. No focal neurological deficits. Extremities: Symmetric 5 x 5 power. Skin: No rashes, lesions or ulcers. Psychiatry: Judgement and insight appear normal. Mood & affect appropriate.   Data Reviewed: I have personally reviewed following labs and imaging studies  CBC: Recent Labs  Lab 18-Nov-2021 0518 10/25/21 0559  WBC 7.0 6.8  NEUTROABS 5.9  --   HGB 13.2 13.6  HCT 40.6 41.0  MCV 94.4 92.8  PLT 182 712    Basic Metabolic Panel: Recent Labs  Lab 11-18-2021 0518 10/25/21 0559  NA 132* 133*  K 4.6 4.3  CL 94* 95*  CO2 31 31  GLUCOSE 109* 91  BUN 26* 22  CREATININE 1.23* 1.00  CALCIUM 8.4* 8.8*    CBG: No results for input(s): "GLUCAP" in the last 168 hours.  No results found for this or any previous visit (from the past 240 hour(s)).   Radiology Studies: EEG adult  Result Date: November 18, 2021 Lora Havens, MD     10/25/2021  8:26 AM Patient Name: [name] MRN: [id number] Epilepsy Attending: Lora Havens Referring Physician/Provider: Lora Havens, MD Date: 11/18/21 Duration: 22.21 mins Patient history: 81 year-old female who presented with fall and was noted to have full body jerking consistent with myoclonus. EEG to evaluate for seizure Level of alertness: Awake AEDs during EEG study: None Technical aspects: This EEG study was done with scalp  electrodes positioned according to the 10-20 International system of electrode placement. Electrical activity was acquired at a sampling rate of '500Hz'$  and reviewed with a high frequency filter of '70Hz'$  and a low frequency filter of '1Hz'$ . EEG data were recorded continuously and digitally stored. Description: The posterior dominant rhythm consists of 9 Hz activity of moderate voltage (25-35 uV) seen predominantly in posterior head regions, symmetric and reactive to eye opening and eye closing. Generalized sharp transient were noted which appeared artifactual but cannot definitively rule out epileptiform nature. Hyperventilation and photic stimulation were not performed.   IMPRESSION: This study showed generalized sharp transients which are most likely artifactual but cannot definitively rule out epileptiform nature. Consider repeat EEG for further evaluation. No seizures were seen throughout the recording. Priyanka [name]    Scheduled Meds:  Chlorhexidine Gluconate Cloth  6 each Topical Daily   enoxaparin (LOVENOX) injection  40 mg Subcutaneous Q24H   escitalopram  10 mg Oral Daily   fluticasone furoate-vilanterol  1 puff Inhalation Daily   furosemide  20 mg Oral BID WC   LORazepam  1 mg Oral Once   melatonin  3 mg Oral QHS   pantoprazole  40 mg Oral Daily   traZODone  150 mg Oral QHS   Continuous Infusions:   LOS: 0 days   Time spent: 35 mins  Mccall Lomax Wynetta Emery, MD How to contact the [facility] Attending or Consulting provider [place] or covering provider during after hours [place], for this patient?  Check the care team in [facility] and look for a) attending/consulting TR[facility] provider listed and b) the [facility] team listed Log into [email or web address] and use [place]'s universal password to access. If you do not have the password, please contact the hospital operator. Locate the [facility] provider you are looking for under [facility] and page to a number that you can be directly reached. If you still have difficulty  reaching the provider, please page the [facility] (Director on Call) for the Hospitalists listed on amion for assistance.  10/26/2021, 2:28 PM

## 2021-10-26 NOTE — Progress Notes (Signed)
Patient very confused and agitated earlier in the shift. Was walking around in the room and in hallway, refused to get back in bed. Unable to redirect patient at that time, eventually she agreed to get back in bed "on her terms" , stated she was tired of people telling her what to do.

## 2021-10-26 NOTE — Procedures (Signed)
Patient Name: [name]  MRN: [id number]  Epilepsy Attending: Lora Havens  Referring Physician/Provider: Lora Havens, MD Date: 10/26/2021 Duration: 22.56 mins   Patient history: 82 year-old female who presented with fall and was noted to have full body jerking consistent with myoclonus. EEG to evaluate for seizure   Level of alertness: Awake   AEDs during EEG study: None   Technical aspects: This EEG study was done with scalp electrodes positioned according to the 10-20 International system of electrode placement. Electrical activity was acquired at a sampling rate of '500Hz'$  and reviewed with a high frequency filter of '70Hz'$  and a low frequency filter of '1Hz'$ . EEG data were recorded continuously and digitally stored.    Description: The posterior dominant rhythm consists of 9 Hz activity of moderate voltage (25-35 uV) seen predominantly in posterior head regions, symmetric and reactive to eye opening and eye closing. Hyperventilation and photic stimulation were not performed.      IMPRESSION: This study is within normal limits. No seizures were seen throughout the recording.   Muzamil Harker [name]

## 2021-10-26 NOTE — Evaluation (Signed)
Physical Therapy Evaluation Patient Details Name: [name] MRN: [id number] DOB: [birth year] Today's Date: 10/26/2021  History of Present Illness  81 year old female with a history of CKD stage II, diverticulosis, GERD, hypertension, anxiety, and LV dysfunction with EF 25% presenting from [facility] with a reported unwitnessed fall.  Apparently, the patient was found on the ground at her facility.  Apparently the patient was awake, but was noted to have seizure-like and jerking-like activity.  EMS reported that the patient would have intermittent episodes followed by episodes of somnolence.  EMS gave the patient Versed which apparently suppressed episodes.  Upon arrival to the ED, EDP did not note any jerking or seizure-like activity.  However the patient was somnolent.  At the time of my evaluation, the patient is awake and alert.  She is a poor historian but able to answer some simple questions.  She does not recall the events prior to arrival, but knows that she is at [facility] and able to follow simple commands.  She denies any headache, chest pain, shortness of breath, nausea, vomiting, diarrhea, abdominal pain.  Remainder of the review of systems is limited secondary to her somnolence.  In the ED, the patient was afebrile and hemodynamically stable albeit with soft blood pressures.  Oxygen saturation was 100% on 3 L.  CT of the brain was negative for acute findings.  CT of the cervical spine was negative for any subluxation or fracture.  Chest x-ray showed pulmonary vascular congestion.  WBC 7.0, hemoglobin 13.2, platelets 1 82,000.  Sodium 132, potassium 4.6, bicarbonate 31, BUN 26, creatinine 1.23.  LFTs were unremarkable.  EKG showed sinus rhythm with left bundle branch block.  Troponin was 30.   Clinical Impression  Patient supine upon therapist arrival and agreeable to participating in PT evaluation today. Patient from ALF and reports she did not use an assistive device and would  walk to the dining hall for meals. Patient performed well with min guard assistance in unfamiliar environment and cues to utilize bed rails for bed mobility. Patient ambulated using a slow, labored cadence with short equal length steps using no assistive device. Patient was fairly steady without reaching for objects. Patient able to side step to left along bedside without assistive device. Patient appears to be functioning near baseline but limited by fatigue and on 2 L/M O2 through nasal cannula throughout session. Patient reports she did not use oxygen at her ALF. Patient would continue to benefit from skilled physical therapy in current environment and next venue to continue return to prior function and increase strength, endurance, balance, coordination, and functional mobility and gait skills.       Recommendations for follow up therapy are one component of a multi-disciplinary discharge planning process, led by the attending physician.  Recommendations may be updated based on patient status, additional functional criteria and insurance authorization.  Follow Up Recommendations Home health PT      Assistance Recommended at Discharge Set up Supervision/Assistance  Patient can return home with the following  A little help with walking and/or transfers;A little help with bathing/dressing/bathroom;Assistance with cooking/housework;Assist for transportation;Help with stairs or ramp for entrance    Equipment Recommendations None recommended by PT  Recommendations for Other Services       Functional Status Assessment Patient has had a recent decline in their functional status and demonstrates the ability to make significant improvements in function in a reasonable and predictable amount of time.     Precautions / Restrictions Precautions  Precautions: Fall Restrictions Weight Bearing Restrictions: No      Mobility  Bed Mobility Overal bed mobility: Needs Assistance Bed Mobility: Supine  to Sit, Sit to Supine     Supine to sit: Supervision, HOB elevated Sit to supine: Supervision   General bed mobility comments: increased time, cues to use bed rails with fair carry over; patient able to independently boost up in bed with visual cues to use bed rails    Transfers Overall transfer level: Needs assistance Equipment used: None Transfers: Sit to/from Stand, Bed to chair/wheelchair/BSC Sit to Stand: Supervision   Step pivot transfers: Min guard, Supervision       General transfer comment: min guard for safety in unfamiliar environment    Ambulation/Gait Ambulation/Gait assistance: Min guard Gait Distance (Feet): 96 Feet Assistive device: None Gait Pattern/deviations: Step-through pattern, Decreased step length - right, Decreased step length - left, Decreased stride length, Narrow base of support, Shuffle Gait velocity: decreased     General Gait Details: slow, labored cadence with short equal length steps using no assistive device; fairly steady without reaching for objects; patient able to side step to left along bedside without assistive device; limited by fatigue; on 2 L/M O2 through nasal cannula  Stairs      Wheelchair Mobility    Modified Rankin (Stroke Patients Only)       Balance Overall balance assessment: Needs assistance Sitting-balance support: Bilateral upper extremity supported, Feet supported Sitting balance-Leahy Scale: Fair Sitting balance - Comments: fair seated EOB   Standing balance support: No upper extremity supported, During functional activity Standing balance-Leahy Scale: Fair Standing balance comment: fair without assistive device         Pertinent Vitals/Pain Pain Assessment Pain Assessment: No/denies pain    Home Living Family/patient expects to be discharged to:: Assisted living       Prior Function Prior Level of Function : Needs assist       Physical Assist : ADLs (physical)   ADLs (physical):  Grooming;Bathing;Dressing;IADLs Mobility Comments: patient reports she would walk to the dining hall for meals       Hand Dominance        Extremity/Trunk Assessment   Upper Extremity Assessment Upper Extremity Assessment: Generalized weakness    Lower Extremity Assessment Lower Extremity Assessment: Generalized weakness    Cervical / Trunk Assessment Cervical / Trunk Assessment: Kyphotic  Communication   Communication: No difficulties  Cognition Arousal/Alertness: Awake/alert Behavior During Therapy: WFL for tasks assessed/performed Overall Cognitive Status: Within Functional Limits for tasks assessed          General Comments General comments (skin integrity, edema, etc.): left hand wrapped in gauze    Exercises     Assessment/Plan    PT Assessment Patient needs continued PT services  PT Problem List Decreased strength;Decreased mobility;Decreased activity tolerance;Decreased balance       PT Treatment Interventions DME instruction;Therapeutic exercise;Gait training;Balance training;Functional mobility training;Therapeutic activities;Patient/family education    PT Goals (Current goals can be found in the Care Plan section)  Acute Rehab PT Goals Patient Stated Goal: Return to ALF PT Goal Formulation: With patient Time For Goal Achievement: 11/09/21 Potential to Achieve Goals: Good    Frequency Min 3X/week        AM-PAC PT "6 Clicks" Mobility  Outcome Measure Help needed turning from your back to your side while in a flat bed without using bedrails?: None Help needed moving from lying on your back to sitting on the side of a flat bed without  using bedrails?: A Little Help needed moving to and from a bed to a chair (including a wheelchair)?: A Little Help needed standing up from a chair using your arms (e.g., wheelchair or bedside chair)?: A Little Help needed to walk in hospital room?: A Little Help needed climbing 3-5 steps with a railing? : A  Little 6 Click Score: 19    End of Session Equipment Utilized During Treatment: Gait belt;Oxygen Activity Tolerance: Patient tolerated treatment well;Patient limited by fatigue Patient left: in bed;with call bell/phone within reach;with bed alarm set Nurse Communication: Mobility status PT Visit Diagnosis: Unsteadiness on feet (R26.81);Other abnormalities of gait and mobility (R26.89);Muscle weakness (generalized) (M62.81)    Time: 0315-9458 PT Time Calculation (min) (ACUTE ONLY): 23 min   Charges:   PT Evaluation $PT Eval Low Complexity: 1 Low PT Treatments $Therapeutic Activity: 8-22 mins        Elizabeth Raveling. Hartnett-Rands, MS, PT Per [place] [phone]  [name]  Logan 10/26/2021, 11:32 AM

## 2021-10-26 NOTE — Progress Notes (Signed)
EEG completed, results pending. 

## 2021-10-26 NOTE — Plan of Care (Signed)
  Problem: Acute Rehab PT Goals(only PT should resolve) Goal: Patient Will Transfer Sit To/From Stand Outcome: Progressing Flowsheets (Taken 10/26/2021 1136) Patient will transfer sit to/from stand: with supervision Goal: Pt Will Transfer Bed To Chair/Chair To Bed Outcome: Progressing Flowsheets (Taken 10/26/2021 1136) Pt will Transfer Bed to Chair/Chair to Bed: with supervision Goal: Pt Will Ambulate Outcome: Progressing Flowsheets (Taken 10/26/2021 1136) Pt will Ambulate:  > 125 feet  with min guard assist  with supervision  with least restrictive assistive device Goal: Pt/caregiver will Perform Home Exercise Program Outcome: Progressing Flowsheets (Taken 10/26/2021 1136) Pt/caregiver will Perform Home Exercise Program:  For increased strengthening  For improved balance  With Supervision, verbal cues required/provided   Floria Raveling. Hartnett-Rands, MS, PT Per [place] [phone] 10/26/2021

## 2021-10-27 DIAGNOSIS — F312 Bipolar disorder, current episode manic severe with psychotic features: Secondary | ICD-10-CM | POA: Diagnosis not present

## 2021-10-27 DIAGNOSIS — I10 Essential (primary) hypertension: Secondary | ICD-10-CM | POA: Diagnosis not present

## 2021-10-27 DIAGNOSIS — G253 Myoclonus: Secondary | ICD-10-CM | POA: Diagnosis not present

## 2021-10-27 DIAGNOSIS — G9341 Metabolic encephalopathy: Secondary | ICD-10-CM | POA: Diagnosis not present

## 2021-10-27 MED ORDER — IBUPROFEN 400 MG PO TABS: 400.0000 mg | ORAL_TABLET | Freq: Three times a day (TID) | ORAL | 0 refills | Status: DC | PRN

## 2021-10-27 MED ORDER — RISPERIDONE 1 MG PO TABS: 1.0000 mg | ORAL_TABLET | Freq: Every day | ORAL | Status: DC

## 2021-10-27 MED ORDER — LOSARTAN POTASSIUM 25 MG PO TABS: 25.0000 mg | ORAL_TABLET | Freq: Every day | ORAL | Status: DC

## 2021-10-27 MED ORDER — RISPERIDONE 1 MG PO TABS: 1.0000 mg | ORAL_TABLET | Freq: Every day | ORAL | 1 refills | Status: DC

## 2021-10-27 MED ORDER — IBUPROFEN 400 MG PO TABS: 400.0000 mg | ORAL_TABLET | Freq: Three times a day (TID) | ORAL | Status: DC | PRN

## 2021-10-27 NOTE — Discharge Summary (Signed)
Physician Discharge Summary  [name] DDU:[id number] DOB: [birth year] DOA: 10/24/2021  PCP: Lubertha Sayres, FNP  Admit date: 10/24/2021 Discharge date: 10/27/2021  Admitted From:  Home  Disposition:  Home   Recommendations for Outpatient Follow-up:  Follow up with PCP in 2 weeks Please follow up with Urology [place] in 1 week for foley removal and voiding trial Please follow up with psychiatry as soon as possible to discuss risperdal  Please obtain BMP/CBC in one week  Home Health:  PT, RN   Discharge Condition: STABLE   CODE STATUS: FULL DIET: resume prior home diet    Brief Hospitalization Summary: Please see all hospital notes, images, labs for full details of the hospitalization. 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  Apparently, the patient was found on the ground at her facility.  Apparently the patient was awake, but was noted to have seizure-like and jerking-like activity.  EMS reported that the patient would have intermittent episodes followed by episodes of somnolence.  EMS gave the patient Versed which apparently suppressed episodes.  Upon arrival to the ED, EDP did not note any jerking or seizure-like activity.  However the patient was somnolent. At the time of my evaluation, the patient is awake and alert.  She is a poor historian but able to answer some simple questions.  She does not recall the events prior to arrival, but knows that she is at [facility] and able to follow simple commands.  She denies any headache, chest pain, shortness of breath, nausea, vomiting, diarrhea, abdominal pain.  Remainder of the review of systems is limited secondary to her somnolence. In the ED, the patient was afebrile and hemodynamically stable albeit with soft blood pressures.  Oxygen saturation was 100% on 3 L.  CT of the  brain was negative for acute findings.  CT of the cervical spine was negative for any subluxation or fracture.  Chest x-ray showed pulmonary vascular congestion.  WBC 7.0, hemoglobin 13.2, platelets 1 82,000.  Sodium 132, potassium 4.6, bicarbonate 31, BUN 26, creatinine 1.23.  LFTs were unremarkable.  EKG showed sinus rhythm with left bundle branch block.  Troponin was 30.  Notably, the patient was recently seen by cardiology on 09/06/2021, Dr. Golden Hurter for LV dysfunction.  Apparently an outpatient echocardiogram showed an EF of 25%.  The patient was scheduled for stress test.  With further recommendations to follow after the stress test.  HOSPITAL COURSE BY PROBLEM   * Acute metabolic encephalopathy - RESOLVED Patient was found awake and somnolent from unwitnessed fall TSH-0.519, CB7-6.28 B15-176 Folic acid - 8.7 [id number] UA negative for pyuria Minimize psychotropic and hypnotic medications seems to be working Recommend patient follow up with psychiatrist to discuss need for ongoing risperdal  We have reduced dose down to 1 mg once daily.  We have discontinued gabapentin.   hyperthyroidism  -she just had a Thyroid Ultrasound 06/08/21 and a Thyroid FNA 06/22/21, and is scheduled for her 6 month follow up in October.   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  --Neurology consult appreciated.  Abnormal movements seem to have resolved now.  -  defer AEDs per neurology consult  -d/c gabapentin -REPEAT EEG: no epileptiform activity seen -recommend patient follow up with psychiatrist to discuss risperdal as possible cause of her myoclonic jerks  Severe manic bipolar 1 disorder with psychotic behavior ([place]) Reduced dose of  risperdal to 1 mg daily Recommend patient follow up with her psychiatrist outpatient to discuss ongoing use of risperdal   Essential hypertension Resume losartan with holding parameters, hold for SBP<95  Anxiety Continue lexapro  Obesity  BMI 32.18 -lifestyle modification  Discharge Diagnoses:  Principal Problem:   Acute metabolic encephalopathy - RESOLVED Active Problems:   Obesity    Anxiety   Essential hypertension   Severe manic bipolar 1 disorder with psychotic behavior ([place])   Myoclonus   Elevated troponin   hyperthyroidism    Discharge Instructions: Discharge Instructions     Ambulatory referral to Urology   Complete by: As directed    [place] TRIAL      Allergies as of 10/27/2021       Reactions   Haloperidol Anaphylaxis   Aripiprazole Other (See Comments)   Bad thoughts    Chlorpheniramine-pse-ibuprofen    Other reaction(s): Cough        Medication List     STOP taking these medications    amoxicillin 500 MG capsule Commonly known as: AMOXIL   gabapentin 100 MG capsule Commonly known as: NEURONTIN       TAKE these medications    acetaminophen 325 MG tablet Commonly known as: TYLENOL Take 650 mg by mouth every 6 (six) hours as needed.   amantadine 100 MG capsule Commonly known as: SYMMETREL Take 100 mg by mouth 2 (two) times daily.   Breo Ellipta 200-25 MCG/ACT Aepb Generic drug: fluticasone furoate-vilanterol Inhale 1 puff into the lungs daily.   cetirizine 10 MG tablet Commonly known as: ZYRTEC Take 10 mg by mouth daily.   COMBIVENT IN Inhale 1 puff into the lungs every 6 (six) hours as needed (wheezing).   escitalopram 10 MG tablet Commonly known as: LEXAPRO Take 10 mg by mouth daily.   fluticasone 50 MCG/ACT nasal spray Commonly known as: FLONASE Place 2 sprays into both nostrils daily.   furosemide 20 MG tablet Commonly known as: LASIX Take 20 mg by mouth 2 (two) times  daily.   ibuprofen 400 MG tablet Commonly known as: ADVIL Take 1 tablet (400 mg total) by mouth every 8 (eight) hours as needed for moderate pain. What changed:  medication strength how much to take reasons to take this   loperamide 2 MG capsule Commonly known as: IMODIUM Take 4 mg by mouth as needed for diarrhea or loose stools.   losartan 25 MG tablet Commonly known as: COZAAR Take 1 tablet (25 mg total) by mouth daily. HOLD FOR SBP<95 What changed: additional instructions   melatonin 3 MG Tabs tablet Take 3 mg by mouth at bedtime.   omeprazole 20 MG capsule Commonly known as: PRILOSEC Take 1 capsule (20 mg total) by mouth daily.   PreserVision AREDS Tabs Take 1 tablet by mouth in the morning and at bedtime.   risperiDONE 1 MG tablet Commonly known as: RISPERDAL Take 1 tablet (1 mg total) by mouth at bedtime. What changed:  medication strength how much to take when to take this   traZODone 150 MG tablet Commonly known as: DESYREL Take 150 mg by mouth at bedtime.        Follow-up Information     [place],  Abigail Butts, [place]. Schedule an appointment as soon as possible for a visit in 1 week(s).   Specialty: Family Medicine Why: Hospital Follow Up Contact information: [address] [place] [address] [place]. Schedule an appointment as soon as possible for a visit in 1 week(s).   Why: Hospital Follow Up for foley removal and voiding trial Contact information: [address] [place] [id number] [phone]               Allergies  Allergen Reactions   Haloperidol Anaphylaxis   Aripiprazole Other (See Comments)    Bad thoughts    Chlorpheniramine-Pse-Ibuprofen     Other reaction(s): Cough   Allergies as of 10/27/2021       Reactions   Haloperidol Anaphylaxis   Aripiprazole Other (See Comments)   Bad thoughts    Chlorpheniramine-pse-ibuprofen    Other reaction(s): Cough         Medication List     STOP taking these medications    amoxicillin 500 MG capsule Commonly known as: AMOXIL   gabapentin 100 MG capsule Commonly known as: NEURONTIN       TAKE these medications    acetaminophen 325 MG tablet Commonly known as: TYLENOL Take 650 mg by mouth every 6 (six) hours as needed.   amantadine 100 MG capsule Commonly known as: SYMMETREL Take 100 mg by mouth 2 (two) times daily.   Breo Ellipta 200-25 MCG/ACT Aepb Generic drug: fluticasone furoate-vilanterol Inhale 1 puff into the lungs daily.   cetirizine 10 MG tablet Commonly known as: ZYRTEC Take 10 mg by mouth daily.   COMBIVENT IN Inhale 1 puff into the lungs every 6 (six) hours as needed (wheezing).   escitalopram 10 MG tablet Commonly known as: LEXAPRO Take 10 mg by mouth daily.   fluticasone 50 MCG/ACT nasal spray Commonly known as: FLONASE Place 2 sprays into both nostrils daily.   furosemide 20 MG tablet Commonly known as: LASIX Take 20 mg by mouth 2 (two) times daily.   ibuprofen 400 MG tablet Commonly known as: ADVIL Take 1 tablet (400 mg total) by mouth every 8 (eight) hours as needed for moderate pain. What changed:  medication strength how much to take reasons to take this   loperamide 2 MG capsule Commonly known as: IMODIUM Take 4 mg by mouth as needed for diarrhea or loose stools.   losartan 25 MG tablet Commonly known as: COZAAR Take 1 tablet (25 mg total) by mouth daily. HOLD FOR SBP<95 What changed: additional instructions   melatonin 3 MG Tabs tablet Take 3 mg by mouth at bedtime.   omeprazole 20 MG capsule Commonly known as: PRILOSEC Take 1 capsule (20 mg total) by mouth daily.   PreserVision AREDS Tabs Take 1 tablet by mouth in the morning and at bedtime.   risperiDONE 1 MG tablet Commonly known as: RISPERDAL Take 1 tablet (1 mg total) by mouth at bedtime. What changed:  medication strength how much to take when to take this    traZODone 150 MG tablet Commonly known as: DESYREL Take 150 mg by mouth at bedtime.        Procedures/Studies: EEG adult  Result Date: 07-Nov-2021 Lora Havens, MD     10/25/2021  8:26 AM Patient Name: [name] MRN: [id number] Epilepsy Attending: Lora Havens Referring Physician/Provider: Lora Havens, MD Date: 2021-11-07 Duration: 22.21 mins Patient history: 81 year-old female who presented  with fall and was noted to have full body jerking consistent with myoclonus. EEG to evaluate for seizure Level of alertness: Awake AEDs during EEG study: None Technical aspects: This EEG study was done with scalp electrodes positioned according to the 10-20 International system of electrode placement. Electrical activity was acquired at a sampling rate of '500Hz'$  and reviewed with a high frequency filter of '70Hz'$  and a low frequency filter of '1Hz'$ . EEG data were recorded continuously and digitally stored. Description: The posterior dominant rhythm consists of 9 Hz activity of moderate voltage (25-35 uV) seen predominantly in posterior head regions, symmetric and reactive to eye opening and eye closing. Generalized sharp transient were noted which appeared artifactual but cannot definitively rule out epileptiform nature. Hyperventilation and photic stimulation were not performed.   IMPRESSION: This study showed generalized sharp transients which are most likely artifactual but cannot definitively rule out epileptiform nature. Consider repeat EEG for further evaluation. No seizures were seen throughout the recording. Priyanka [name]   CT HEAD WO CONTRAST (5MM)  Result Date: 10/24/2021 CLINICAL DATA:  Unwitnessed fall with subsequent seizure EXAM: CT HEAD WITHOUT CONTRAST CT CERVICAL SPINE WITHOUT CONTRAST TECHNIQUE: Multidetector CT imaging of the head and cervical spine was performed following the standard protocol without intravenous contrast. Multiplanar CT image reconstructions of the cervical  spine were also generated. RADIATION DOSE REDUCTION: This exam was performed according to the departmental dose-optimization program which includes automated exposure control, adjustment of the mA and/or kV according to patient size and/or use of iterative reconstruction technique. COMPARISON:  06/14/2016 FINDINGS: CT HEAD FINDINGS Brain: No evidence of acute infarction, hemorrhage, hydrocephalus, extra-axial collection or mass lesion/mass effect. Chronic small vessel ischemia in the hemispheric white matter which is extensive. Vascular: No hyperdense vessel or unexpected calcification. Skull: Normal. Negative for fracture or focal lesion. Hyperostosis interna Sinuses/Orbits: Bilateral cataract resection CT CERVICAL SPINE FINDINGS Alignment: No traumatic malalignment Skull base and vertebrae: No acute fracture Soft tissues and spinal canal: No prevertebral fluid or swelling. No visible canal hematoma. Enlarged and nodular thyroid gland without invasive features or significant change since 2018. Dominant nodules are on the right. No follow-up recommended unless clinically warranted (ref: J Am Coll Radiol. 2015 Feb;12(2): 143-50). Flattening of the trachea in the upper chest. Disc levels:  Ordinary cervical spine degeneration Upper chest: No acute finding. IMPRESSION: No evidence of acute intracranial or cervical spine injury. Electronically Signed   By: Jorje Guild M.D.   On: 10/24/2021 06:34   CT CERVICAL SPINE WO CONTRAST  Result Date: 10/24/2021 CLINICAL DATA:  Unwitnessed fall with subsequent seizure EXAM: CT HEAD WITHOUT CONTRAST CT CERVICAL SPINE WITHOUT CONTRAST TECHNIQUE: Multidetector CT imaging of the head and cervical spine was performed following the standard protocol without intravenous contrast. Multiplanar CT image reconstructions of the cervical spine were also generated. RADIATION DOSE REDUCTION: This exam was performed according to the departmental dose-optimization program which includes  automated exposure control, adjustment of the mA and/or kV according to patient size and/or use of iterative reconstruction technique. COMPARISON:  06/14/2016 FINDINGS: CT HEAD FINDINGS Brain: No evidence of acute infarction, hemorrhage, hydrocephalus, extra-axial collection or mass lesion/mass effect. Chronic small vessel ischemia in the hemispheric white matter which is extensive. Vascular: No hyperdense vessel or unexpected calcification. Skull: Normal. Negative for fracture or focal lesion. Hyperostosis interna Sinuses/Orbits: Bilateral cataract resection CT CERVICAL SPINE FINDINGS Alignment: No traumatic malalignment Skull base and vertebrae: No acute fracture Soft tissues and spinal canal: No prevertebral fluid or swelling. No visible canal  hematoma. Enlarged and nodular thyroid gland without invasive features or significant change since 2018. Dominant nodules are on the right. No follow-up recommended unless clinically warranted (ref: J Am Coll Radiol. 2015 Feb;12(2): 143-50). Flattening of the trachea in the upper chest. Disc levels:  Ordinary cervical spine degeneration Upper chest: No acute finding. IMPRESSION: No evidence of acute intracranial or cervical spine injury. Electronically Signed   By: Jorje Guild M.D.   On: 10/24/2021 06:34   DG Chest Port 1 View  Result Date: 10/24/2021 CLINICAL DATA:  Fall. EXAM: PORTABLE CHEST 1 VIEW COMPARISON:  06/18/2019 FINDINGS: 0533 hours. The cardio pericardial silhouette is enlarged. There is pulmonary vascular congestion without overt pulmonary edema. Stable asymmetric elevation right hemidiaphragm stable streaky retrocardiac density compatible with chronic atelectasis or scarring. Bones are diffusely demineralized. Telemetry leads overlie the chest. IMPRESSION: 1. Pulmonary vascular congestion without overt pulmonary edema. 2. Stable asymmetric elevation right hemidiaphragm. Electronically Signed   By: Misty Stanley M.D.   On: 10/24/2021 06:30      Subjective: Pt reports feeling better today.  No SOB.  No jerking movements and eating well.    Discharge Exam: Vitals:   10/27/21 0545 10/27/21 0735  BP: 136/65   Pulse: 87   Resp: 19   Temp: 98.1 F (36.7 C)   SpO2: 95% 91%   Vitals:   10/26/21 1306 10/26/21 2209 10/27/21 0545 10/27/21 0735  BP: 113/60 128/69 136/65   Pulse: 88 77 87   Resp: '18 18 19   '$ Temp: 98.5 F (36.9 C) 98.5 F (36.9 C) 98.1 F (36.7 C)   TempSrc: Oral     SpO2: 92% 95% 95% 91%  Weight:      Height:       General: Pt is alert, awake, not in acute distress Cardiovascular: normal S1/S2 +, no rubs, no gallops Respiratory: CTA bilaterally, no wheezing, no rhonchi Abdominal: Soft, NT, ND, bowel sounds + Extremities: no edema, no cyanosis   The results of significant diagnostics from this hospitalization (including imaging, microbiology, ancillary and laboratory) are listed below for reference.     Microbiology: No results found for this or any previous visit (from the past 240 hour(s)).   Labs: BNP (last 3 results) No results for input(s): "BNP" in the last 8760 hours. Basic Metabolic Panel: Recent Labs  Lab 10/24/21 0518 10/25/21 0559  NA 132* 133*  K 4.6 4.3  CL 94* 95*  CO2 31 31  GLUCOSE 109* 91  BUN 26* 22  CREATININE 1.23* 1.00  CALCIUM 8.4* 8.8*   Liver Function Tests: Recent Labs  Lab 10/24/21 0518 10/25/21 0559  AST 16 15  ALT 13 13  ALKPHOS 68 68  BILITOT 0.6 1.0  PROT 5.8* 5.7*  ALBUMIN 3.4* 3.5   No results for input(s): "LIPASE", "AMYLASE" in the last 168 hours. Recent Labs  Lab 10/24/21 1051  AMMONIA 10   CBC: Recent Labs  Lab 10/24/21 0518 10/25/21 0559  WBC 7.0 6.8  NEUTROABS 5.9  --   HGB 13.2 13.6  HCT 40.6 41.0  MCV 94.4 92.8  PLT 182 181   Cardiac Enzymes: No results for input(s): "CKTOTAL", "CKMB", "CKMBINDEX", "TROPONINI" in the last 168 hours. BNP: Invalid input(s): "POCBNP" CBG: No results for input(s): "GLUCAP" in the last 168  hours. D-Dimer No results for input(s): "DDIMER" in the last 72 hours. Hgb A1c No results for input(s): "HGBA1C" in the last 72 hours. Lipid Profile No results for input(s): "CHOL", "HDL", "LDLCALC", "TRIG", "CHOLHDL", "LDLDIRECT"  in the last 72 hours. Thyroid function studies No results for input(s): "TSH", "T4TOTAL", "T3FREE", "THYROIDAB" in the last 72 hours.  Invalid input(s): "FREET3" Anemia work up No results for input(s): "VITAMINB12", "FOLATE", "FERRITIN", "TIBC", "IRON", "RETICCTPCT" in the last 72 hours. Urinalysis    Component Value Date/Time   COLORURINE YELLOW 10/24/2021 0533   APPEARANCEUR CLEAR 10/24/2021 0533   LABSPEC 1.012 10/24/2021 0533   PHURINE 5.0 10/24/2021 0533   GLUCOSEU NEGATIVE 10/24/2021 0533   HGBUR NEGATIVE 10/24/2021 0533   BILIRUBINUR NEGATIVE 10/24/2021 0533   BILIRUBINUR negative 10/29/2016 1541   KETONESUR NEGATIVE 10/24/2021 0533   PROTEINUR NEGATIVE 10/24/2021 0533   UROBILINOGEN 0.2 10/29/2016 1541   UROBILINOGEN 0.2 07/21/2007 2218   NITRITE NEGATIVE 10/24/2021 0533   LEUKOCYTESUR NEGATIVE 10/24/2021 0533   Sepsis Labs Recent Labs  Lab 10/24/21 0518 10/25/21 0559  WBC 7.0 6.8   Microbiology No results found for this or any previous visit (from the past 240 hour(s)).  Time coordinating discharge: 38 mins  SIGNED:  Irwin Brakeman, MD  [facility] 10/27/2021, 10:58 AM How to contact the [facility] Attending or Consulting provider [place] or covering provider during after hours [place], for this patient?  Check the care team in [facility] and look for a) attending/consulting [facility] provider listed and b) the [facility] team listed Log into [email or web address] and use [place]'s universal password to access. If you do not have the password, please contact the hospital operator. Locate the [facility] provider you are looking for under [facility] and page to a number that you can be directly reached. If you still have difficulty reaching the provider,  please page the [facility] (Director on Call) for the Hospitalists listed on amion for assistance.

## 2021-10-27 NOTE — Discharge Instructions (Addendum)
PLEASE FOLLOW UP WITH UROLOGY CLINIC IN 1 WEEK TO HAVE FOLEY REMOVED AND DO VOIDING TRIAL.   FOLLOW UP WITH PSYCHIATRIST AS SOON AS POSSIBLE TO DISCUSS RISPERDAL AS POSSIBLE CAUSE OF JERKING MOVEMENTS.     IMPORTANT INFORMATION: PAY CLOSE ATTENTION   PHYSICIAN DISCHARGE INSTRUCTIONS  Follow with Primary care provider  Lubertha Sayres, FNP  and other consultants as instructed by your Hospitalist Physician  [place] IF SYMPTOMS COME BACK, WORSEN OR NEW PROBLEM DEVELOPS   Please note: You were cared for by a hospitalist during your hospital stay. Every effort will be made to forward records to your primary care provider.  You can request that your primary care provider send for your hospital records if they have not received them.  Once you are discharged, your primary care physician will handle any further medical issues. Please note that NO REFILLS for any discharge medications will be authorized once you are discharged, as it is imperative that you return to your primary care physician (or establish a relationship with a primary care physician if you do not have one) for your post hospital discharge needs so that they can reassess your need for medications and monitor your lab values.  Please get a complete blood count and chemistry panel checked by your Primary MD at your next visit, and again as instructed by your Primary MD.  Get Medicines reviewed and adjusted: Please take all your medications with you for your next visit with your Primary MD  Laboratory/radiological data: Please request your Primary MD to go over all hospital tests and procedure/radiological results at the follow up, please ask your primary care provider to get all Hospital records sent to his/her office.  In some cases, they will be blood work, cultures and biopsy results pending at the time of your discharge. Please request that your primary care provider follow up on these  results.  If you are diabetic, please bring your blood sugar readings with you to your follow up appointment with primary care.    Please call and make your follow up appointments as soon as possible.    Also Note the following: If you experience worsening of your admission symptoms, develop shortness of breath, life threatening emergency, suicidal or homicidal thoughts you must seek medical attention immediately by calling 911 or calling your MD immediately  if symptoms less severe.  You must read complete instructions/literature along with all the possible adverse reactions/side effects for all the Medicines you take and that have been prescribed to you. Take any new Medicines after you have completely understood and accpet all the possible adverse reactions/side effects.   Do not drive when taking Pain medications or sleeping medications (Benzodiazepines)  Do not take more than prescribed Pain, Sleep and Anxiety Medications. It is not advisable to combine anxiety,sleep and pain medications without talking with your primary care practitioner  Special Instructions: If you have smoked or chewed Tobacco  in the last 2 yrs please stop smoking, stop any regular Alcohol  and or any Recreational drug use.  Wear Seat belts while driving.  Do not drive if taking any narcotic, mind altering or controlled substances or recreational drugs or alcohol.

## 2021-10-27 NOTE — TOC Transition Note (Signed)
Transition of Care [facility]) - CM/SW Discharge Note   Patient Details  Name: [name] MRN: [id number] Date of Birth: 1940/09/26  Transition of Care [facility]) CM/SW Contact:  Salome Arnt, [place] Phone Number: 10/27/2021, 12:02 PM   Clinical Narrative: Pt d/c today back to [place] at facility aware and agreeable. Trudy aware of foley. HHPT/RN arranged with [facility]. Orders in. D/C summary and FL2 sent to ALF. Facility to provide transport this afternoon. RN updated. LCSW notified pt's niece/legal guardian, Nira Conn of d/c. She is agreeable, but requests call from MD to discuss hospital admission. MD aware.        Final next level of care: Assisted Living Barriers to Discharge: Barriers Resolved   Patient Goals and CMS Choice Patient states their goals for this hospitalization and ongoing recovery are:: return to ALF CMS Medicare.gov Compare Post Acute Care list provided to:: Patient Choice offered to / list presented to : Patient  Discharge Placement                  Name of family member notified: Heather Patient and family notified of of transfer: 10/27/21  Discharge Plan and Services                          HH Arranged: RN, PT [facility] Agency: [place] Date [place]: 10/27/21 Time [place]: [id number] Representative spoke with at [place]: [place] ([place]) Interventions     Readmission Risk Interventions     No data to display

## 2021-10-27 NOTE — Progress Notes (Signed)
10/27/2021 12:52 PM  Called both numbers for patient's niece Noralee Space.  No answer to either number. Left voice message.    Murvin Natal, MD

## 2021-10-27 NOTE — Progress Notes (Signed)
10/27/2021 4:49 PM  E prescriptions not received at outside pharmacy after 2 attempts.  I was able to call and give a verbal Rx to [facility] in [place].    Murvin Natal, MD

## 2021-10-27 NOTE — Progress Notes (Signed)
SATURATION QUALIFICATIONS:  Patient Saturations on Room Air at Rest = 95%  Patient Saturations on Room Air while Ambulating = 93-95%  Pt doesnt need oxygen at this time.

## 2021-10-27 NOTE — NC FL2 (Signed)
[place] LEVEL OF CARE SCREENING TOOL     IDENTIFICATION  Patient Name: [name] Birthdate: [birth year] Sex: female Admission Date (Current Location): 10/24/2021  [place] and [place] Number:  [place] [id number] [place] and Address:  [place] [address] [place]      Provider Number: [id number]  Attending Physician Name and Address:  Murlean Iba, MD  Relative Name and Phone Number:  Noralee Space - Niece - [phone]    Current Level of Care: Hospital Recommended Level of Care: Assisted Living Facility Prior Approval Number:    Date Approved/Denied:   PASRR Number:    Discharge Plan: Domiciliary (Rest home)    Current Diagnoses: Patient Active Problem List   Diagnosis Date Noted   hyperthyroidism  44/31/5400   Acute metabolic encephalopathy - RESOLVED 10/24/2021   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   Elevated troponin 10/24/2021   Pain due to onychomycosis of toenails of both feet 09/12/2020   Chronic low back pain without sciatica 10/02/2017   Essential hypertension 10/02/2017   Paranoid schizophrenia ([place]) 09/19/2017   Delusional disorder, 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[place]) 09/18/2017   Behavioral change 09/18/2017   Severe manic bipolar 1 disorder with psychotic behavior ([place]) 08/01/2017   History of left inguinal hernia repair 08/06/2016   H/O total knee replacement, left 06/19/2016   Primary osteoarthritis of left knee 04/04/2016   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/2016   Anxiety 11/15/2015   Gastroesophageal reflux disease without esophagitis 11/15/2015   Solitary right kidney 07/07/2015   Urge incontinence of urine 07/07/2015   Asthma 12/07/2011   Arthritis 12/07/2011   Thoracic spondylosis - mild to moderate 2009 12/07/2011   Obesity  12/07/2011   Incisional hernia 03/29/2011   DIVERTICULOSIS-COLON 12/01/2008   ABDOMINAL PAIN-RLQ 12/01/2008    Orientation RESPIRATION BLADDER Height & Weight     Self, Place, Time   Normal Indwelling catheter Weight: 175 lb 14.8 oz (79.8 kg) Height:  '5\' 2"'$  (157.5 cm)  BEHAVIORAL SYMPTOMS/MOOD NEUROLOGICAL BOWEL NUTRITION STATUS      Continent Diet (Heart healthy)  AMBULATORY STATUS COMMUNICATION OF NEEDS Skin   Limited Assist Verbally Bruising                       Personal Care Assistance Level of Assistance  Bathing, Feeding, Dressing Bathing Assistance: Limited assistance Feeding assistance: Independent Dressing Assistance: Limited assistance     Functional Limitations Info  Sight, Hearing, Speech Sight Info: Impaired Hearing Info: Adequate Speech Info: Adequate    SPECIAL CARE FACTORS FREQUENCY  PT (By licensed PT)     PT Frequency: home health              Contractures Contractures Info: Not present    Additional Factors Info  Code Status, Allergies Code Status Info: Full Allergies Info: Haloperidol, Aripiprazole, Chlorpheniramine -pse-ibufrofen           Current Medications (10/27/2021):  This is the current hospital active medication list Current Facility-Administered Medications  Medication Dose Route Frequency Provider Last Rate Last Admin   acetaminophen (TYLENOL) tablet 650 mg  650 mg Oral Q6H PRN Tat, Shanon Brow, MD   650 mg at 10/27/21 0000   Or   acetaminophen (TYLENOL) suppository 650 mg  650 mg Rectal Q6H PRN Tat, Shanon Brow, MD       Chlorhexidine Gluconate Cloth 2 % PADS 6 each  6 each Topical Daily Tat, David, MD   6 each at 10/27/21 0825   enoxaparin (LOVENOX) injection 40  mg  40 mg Subcutaneous Q24H Tat, David, MD   40 mg at 10/26/21 1150   escitalopram (LEXAPRO) tablet 10 mg  10 mg Oral Daily Tat, David, MD   10 mg at 10/27/21 0825   fluticasone furoate-vilanterol (BREO ELLIPTA) 200-25 MCG/ACT 1 puff  1 puff Inhalation Daily Tat, David, MD   1 puff at 10/27/21 0735   furosemide (LASIX) tablet 20 mg  20 mg Oral BID WC Tat, Shanon Brow, MD   20 mg at 10/27/21 0825   LORazepam (ATIVAN) tablet 1 mg  1 mg Oral Once Zierle-Ghosh, Asia B,  DO       melatonin tablet 3 mg  3 mg Oral QHS Tat, Shanon Brow, MD   3 mg at 10/25/21 2116   ondansetron (ZOFRAN) tablet 4 mg  4 mg Oral Q6H PRN Tat, Shanon Brow, MD       Or   ondansetron (ZOFRAN) injection 4 mg  4 mg Intravenous Q6H PRN Tat, Shanon Brow, MD       pantoprazole (PROTONIX) EC tablet 40 mg  40 mg Oral Daily Tat, David, MD   40 mg at 10/27/21 0825   traZODone (DESYREL) tablet 150 mg  150 mg Oral Benay Pike, MD   150 mg at 10/25/21 2116     Discharge Medications: Medication List       STOP taking these medications     amoxicillin 500 MG capsule Commonly known as: AMOXIL    gabapentin 100 MG capsule Commonly known as: NEURONTIN           TAKE these medications     acetaminophen 325 MG tablet Commonly known as: TYLENOL Take 650 mg by mouth every 6 (six) hours as needed.    amantadine 100 MG capsule Commonly known as: SYMMETREL Take 100 mg by mouth 2 (two) times daily.    Breo Ellipta 200-25 MCG/ACT Aepb Generic drug: fluticasone furoate-vilanterol Inhale 1 puff into the lungs daily.    cetirizine 10 MG tablet Commonly known as: ZYRTEC Take 10 mg by mouth daily.    COMBIVENT IN Inhale 1 puff into the lungs every 6 (six) hours as needed (wheezing).    escitalopram 10 MG tablet Commonly known as: LEXAPRO Take 10 mg by mouth daily.    fluticasone 50 MCG/ACT nasal spray Commonly known as: FLONASE Place 2 sprays into both nostrils daily.    furosemide 20 MG tablet Commonly known as: LASIX Take 20 mg by mouth 2 (two) times daily.    ibuprofen 400 MG tablet Commonly known as: ADVIL Take 1 tablet (400 mg total) by mouth every 8 (eight) hours as needed for moderate pain. What changed:  medication strength how much to take reasons to take this    loperamide 2 MG capsule Commonly known as: IMODIUM Take 4 mg by mouth as needed for diarrhea or loose stools.    losartan 25 MG tablet Commonly known as: COZAAR Take 1 tablet (25 mg total) by mouth daily. HOLD FOR  SBP<95 What changed: additional instructions    melatonin 3 MG Tabs tablet Take 3 mg by mouth at bedtime.    omeprazole 20 MG capsule Commonly known as: PRILOSEC Take 1 capsule (20 mg total) by mouth daily.    PreserVision AREDS Tabs Take 1 tablet by mouth in the morning and at bedtime.    risperiDONE 1 MG tablet Commonly known as: RISPERDAL Take 1 tablet (1 mg total) by mouth at bedtime. What changed:  medication strength how much to take when to take  this    traZODone 150 MG tablet Commonly known as: DESYREL Take 150 mg by mouth at bedtime.    Relevant Imaging Results:  Relevant Lab Results:   Additional Information SS# [id number]  Salome Arnt, [place]

## 2021-11-06 ENCOUNTER — Ambulatory Visit (INDEPENDENT_AMBULATORY_CARE_PROVIDER_SITE_OTHER): Payer: Medicare Other | Admitting: Urology

## 2021-11-06 ENCOUNTER — Encounter: Payer: Self-pay | Admitting: Urology

## 2021-11-06 VITALS — BP 109/55 | HR 93

## 2021-11-06 DIAGNOSIS — R310 Gross hematuria: Secondary | ICD-10-CM | POA: Diagnosis not present

## 2021-11-06 MED ORDER — NITROFURANTOIN MONOHYD MACRO 100 MG PO CAPS: 100.0000 mg | ORAL_CAPSULE | Freq: Two times a day (BID) | ORAL | 0 refills | Status: DC

## 2021-11-06 NOTE — Progress Notes (Signed)
11/06/2021 3:46 PM   Jack Quarto 1940-10-25 [id number]  Referring provider: Lubertha Sayres, [address] [place],  [place] [address]  Gross hematuria  HPI: Ms Milford is a 81yo here for gross hematuria. She had a foley placed on 7/18. 1 week ago she developed gross hematuria and worsening pelvic pain. She has urinary urgency with the foley in place. No fevers.    PMH: Past Medical History:  Diagnosis Date   Anemia 1990   hx of   Anxiety    Arthritis 2004   Asthma 1995   uses inhaler   Chronic kidney disease 2010   LEFT kidney removed   Colon polyp    Complication of anesthesia 1966   first surgery she had- had a hard time waking up from surgery   Diverticulosis 2003   GERD (gastroesophageal reflux disease)    on meds    Surgical History: Past Surgical History:  Procedure Laterality Date   APPENDECTOMY  1966   CATARACT EXTRACTION     Bilateral   CHOLECYSTECTOMY  2004   COLON SURGERY  2008   COLONOSCOPY  2013   JP-MAC-moviprep(good)-TA   COLOSTOMY     COLOSTOMY TAKEDOWN     DIAGNOSTIC LAPAROSCOPY     exploratory for endometriosis   HEEL SPUR SURGERY     Right, Metal Plate in [place]  2010   NEPHRECTOMY Left 3151   As complication of partial colectomy for diverticulitis   POLYPECTOMY  2013   TA   TONSILLECTOMY     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   Procedure: LEFT TOTAL KNEE ARTHROPLASTY;  Surgeon: Susa Day, MD;  Location: [facility];  Service: Orthopedics;  Laterality: Left;  Adductor Block    Home Medications:  Allergies as of 11/06/2021       Reactions   Haloperidol Anaphylaxis   Aripiprazole Other (See Comments)   Bad thoughts    Chlorpheniramine-pse-ibuprofen    Other reaction(s): Cough        Medication List        Accurate as of November 06, 2021  3:46 PM. If you have any questions, ask your nurse or doctor.          acetaminophen 325 MG tablet Commonly known as: TYLENOL Take 650 mg by mouth every  6 (six) hours as needed.   amantadine 100 MG capsule Commonly known as: SYMMETREL Take 100 mg by mouth 2 (two) times daily.   Breo Ellipta 200-25 MCG/ACT Aepb Generic drug: fluticasone furoate-vilanterol Inhale 1 puff into the lungs daily.   cetirizine 10 MG tablet Commonly known as: ZYRTEC Take 10 mg by mouth daily.   COMBIVENT IN Inhale 1 puff into the lungs every 6 (six) hours as needed (wheezing).   escitalopram 10 MG tablet Commonly known as: LEXAPRO Take 10 mg by mouth daily.   fluticasone 50 MCG/ACT nasal spray Commonly known as: FLONASE Place 2 sprays into both nostrils daily.   furosemide 20 MG tablet Commonly known as: LASIX Take 20 mg by mouth 2 (two) times daily.   ibuprofen 400 MG tablet Commonly known as: ADVIL Take 1 tablet (400 mg total) by mouth every 8 (eight) hours as needed for moderate pain, fever or cramping.   loperamide 2 MG capsule Commonly known as: IMODIUM Take 4 mg by mouth as needed for diarrhea or loose stools.   losartan 25 MG tablet Commonly known as: COZAAR Take 1 tablet (25 mg total) by mouth daily. HOLD FOR  SBP<95   melatonin 3 MG Tabs tablet Take 3 mg by mouth at bedtime.   omeprazole 20 MG capsule Commonly known as: PRILOSEC Take 1 capsule (20 mg total) by mouth daily.   PreserVision AREDS Tabs Take 1 tablet by mouth in the morning and at bedtime.   risperiDONE 1 MG tablet Commonly known as: RISPERDAL Take 1 tablet (1 mg total) by mouth at bedtime.   traZODone 150 MG tablet Commonly known as: DESYREL Take 150 mg by mouth at bedtime.        Allergies:  Allergies  Allergen Reactions   Haloperidol Anaphylaxis   Aripiprazole Other (See Comments)    Bad thoughts    Chlorpheniramine-Pse-Ibuprofen     Other reaction(s): Cough    Family History: Family History  Problem Relation Age of Onset   Hypertension Mother    Heart failure Mother    Hyperlipidemia Mother    Stroke Mother    Hypertension Father    Heart  attack Father    COPD Father    Diabetes Paternal Aunt    Diabetes Paternal Grandmother    Colon polyps Other        niece   Stomach cancer Neg Hx    Rectal cancer Neg Hx    Esophageal cancer Neg Hx    Colon cancer Neg Hx     Social History:  reports that she has never smoked. She has never used smokeless tobacco. She reports that she does not currently use alcohol. She reports that she does not use drugs.  ROS: All other review of systems were reviewed and are negative except what is noted above in HPI  Physical Exam: BP (!) 109/55   Pulse 93   Constitutional:  Alert and oriented, No acute distress. HEENT: [place] AT, moist mucus membranes.  Trachea midline, no masses. Cardiovascular: No clubbing, cyanosis, or edema. Respiratory: Normal respiratory effort, no increased work of breathing. GI: Abdomen is soft, nontender, nondistended, no abdominal masses GU: No CVA tenderness.  Lymph: No cervical or inguinal lymphadenopathy. Skin: No rashes, bruises or suspicious lesions. Neurologic: Grossly intact, no focal deficits, moving all 4 extremities. Psychiatric: Normal mood and affect.  Laboratory Data: Lab Results  Component Value Date   WBC 6.8 10/25/2021   HGB 13.6 10/25/2021   HCT 41.0 10/25/2021   MCV 92.8 10/25/2021   PLT 181 10/25/2021    Lab Results  Component Value Date   CREATININE 1.00 10/25/2021    No results found for: "PSA"  No results found for: "TESTOSTERONE"  No results found for: "HGBA1C"  Urinalysis    Component Value Date/Time   COLORURINE YELLOW 10/24/2021 0533   APPEARANCEUR CLEAR 10/24/2021 0533   LABSPEC 1.012 10/24/2021 0533   PHURINE 5.0 10/24/2021 0533   GLUCOSEU NEGATIVE 10/24/2021 0533   HGBUR NEGATIVE 10/24/2021 0533   BILIRUBINUR NEGATIVE 10/24/2021 0533   BILIRUBINUR negative 10/29/2016 1541   KETONESUR NEGATIVE 10/24/2021 0533   PROTEINUR NEGATIVE 10/24/2021 0533   UROBILINOGEN 0.2 10/29/2016 1541   UROBILINOGEN 0.2 07/21/2007  2218   NITRITE NEGATIVE 10/24/2021 0533   LEUKOCYTESUR NEGATIVE 10/24/2021 0533    Lab Results  Component Value Date   BACTERIA RARE (A) 09/17/2017    Pertinent Imaging:  Results for orders placed during the hospital encounter of 03/25/14  DG Abd 1 View  Narrative CLINICAL DATA:  Right lower quadrant pain for 1 day  EXAM: ABDOMEN - 1 VIEW  COMPARISON:  06/19/2012  FINDINGS: Cholecystectomy clips are noted in the right  upper quadrant. The bowel gas pattern is normal. No radio-opaque calculi or other significant radiographic abnormality are seen.  IMPRESSION: Negative.   Electronically Signed By: Kerby Moors M.D. On: 03/25/2014 17:00  No results found for this or any previous visit.  No results found for this or any previous visit.  No results found for this or any previous visit.  No results found for this or any previous visit.  No results found for this or any previous visit.  No results found for this or any previous visit.  No results found for this or any previous visit.   Assessment & Plan:    1. Gross hematuria -Urine for culture -macrobid 145m BID for 7 days -RTC 2 days for a voiding trial   No follow-ups on file.  PNicolette Bang MD  [facility] [place]

## 2021-11-06 NOTE — Patient Instructions (Signed)

## 2021-11-08 ENCOUNTER — Ambulatory Visit (INDEPENDENT_AMBULATORY_CARE_PROVIDER_SITE_OTHER): Payer: Medicare Other | Admitting: Urology

## 2021-11-08 ENCOUNTER — Ambulatory Visit: Payer: Medicare Other | Admitting: Urology

## 2021-11-08 ENCOUNTER — Encounter: Payer: Self-pay | Admitting: Urology

## 2021-11-08 VITALS — BP 136/85 | HR 71

## 2021-11-08 DIAGNOSIS — R310 Gross hematuria: Secondary | ICD-10-CM | POA: Diagnosis not present

## 2021-11-08 NOTE — Progress Notes (Signed)
Fill and Pull Catheter Removal  Patient is present today for a catheter removal.  Patient was cleaned and prepped in a sterile fashion 100 ml of sterile water/ saline was instilled into the bladder when the patient felt the urge to urinate. 10 ml of water was then drained from the balloon.  A coude cath was removed from the bladder no complications were noted .  Patient as then given some time to void on their own.  Patient can void  100 ml on their own after some time.  Patient tolerated well.  Performed by: Marisue Brooklyn, CMA  Follow up/ Additional notes: Patient to see MD

## 2021-11-08 NOTE — Progress Notes (Signed)
11/08/2021 11:05 AM   Jack Quarto 1940/05/13 [id number]  Referring provider: Lubertha Sayres, [place] [address] [place],  [place] [address]  Followup urinary retention  HPI: Ms Noorani is a 81yo here for followup for gross hematuria and urinary retention. Urine is clear today. Voiding trial passed. She denies any pelvis pain. No other complaints today   PMH: Past Medical History:  Diagnosis Date   Anemia 1990   hx of   Anxiety    Arthritis 2004   Asthma 1995   uses inhaler   Chronic kidney disease 2010   LEFT kidney removed   Colon polyp    Complication of anesthesia 1966   first surgery she had- had a hard time waking up from surgery   Diverticulosis 2003   GERD (gastroesophageal reflux disease)    on meds    Surgical History: Past Surgical History:  Procedure Laterality Date   APPENDECTOMY  1966   CATARACT EXTRACTION     Bilateral   CHOLECYSTECTOMY  2004   COLON SURGERY  2008   COLONOSCOPY  2013   JP-MAC-moviprep(good)-TA   COLOSTOMY     COLOSTOMY TAKEDOWN     DIAGNOSTIC LAPAROSCOPY     exploratory for endometriosis   HEEL SPUR SURGERY     Right, Metal Plate in [place]  2010   NEPHRECTOMY Left 5465   As complication of partial colectomy for diverticulitis   POLYPECTOMY  2013   TA   TONSILLECTOMY     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   Procedure: LEFT TOTAL KNEE ARTHROPLASTY;  Surgeon: Susa Day, MD;  Location: [facility];  Service: Orthopedics;  Laterality: Left;  Adductor Block    Home Medications:  Allergies as of 11/08/2021       Reactions   Haloperidol Anaphylaxis   Aripiprazole Other (See Comments)   Bad thoughts    Chlorpheniramine-pse-ibuprofen    Other reaction(s): Cough        Medication List        Accurate as of November 08, 2021 11:05 AM. If you have any questions, ask your nurse or doctor.          acetaminophen 325 MG tablet Commonly known as: TYLENOL Take 650 mg by mouth every 6 (six) hours as  needed.   amantadine 100 MG capsule Commonly known as: SYMMETREL Take 100 mg by mouth 2 (two) times daily.   Breo Ellipta 200-25 MCG/ACT Aepb Generic drug: fluticasone furoate-vilanterol Inhale 1 puff into the lungs daily.   cetirizine 10 MG tablet Commonly known as: ZYRTEC Take 10 mg by mouth daily.   COMBIVENT IN Inhale 1 puff into the lungs every 6 (six) hours as needed (wheezing).   escitalopram 10 MG tablet Commonly known as: LEXAPRO Take 10 mg by mouth daily.   fluticasone 50 MCG/ACT nasal spray Commonly known as: FLONASE Place 2 sprays into both nostrils daily.   furosemide 20 MG tablet Commonly known as: LASIX Take 20 mg by mouth 2 (two) times daily.   ibuprofen 400 MG tablet Commonly known as: ADVIL Take 1 tablet (400 mg total) by mouth every 8 (eight) hours as needed for moderate pain, fever or cramping.   loperamide 2 MG capsule Commonly known as: IMODIUM Take 4 mg by mouth as needed for diarrhea or loose stools.   losartan 25 MG tablet Commonly known as: COZAAR Take 1 tablet (25 mg total) by mouth daily. HOLD FOR SBP<95   melatonin 3 MG Tabs tablet Take  3 mg by mouth at bedtime.   nitrofurantoin (macrocrystal-monohydrate) 100 MG capsule Commonly known as: MACROBID Take 1 capsule (100 mg total) by mouth every 12 (twelve) hours.   omeprazole 20 MG capsule Commonly known as: PRILOSEC Take 1 capsule (20 mg total) by mouth daily.   PreserVision AREDS Tabs Take 1 tablet by mouth in the morning and at bedtime.   risperiDONE 1 MG tablet Commonly known as: RISPERDAL Take 1 tablet (1 mg total) by mouth at bedtime.   traZODone 150 MG tablet Commonly known as: DESYREL Take 150 mg by mouth at bedtime.        Allergies:  Allergies  Allergen Reactions   Haloperidol Anaphylaxis   Aripiprazole Other (See Comments)    Bad thoughts    Chlorpheniramine-Pse-Ibuprofen     Other reaction(s): Cough    Family History: Family History  Problem Relation  Age of Onset   Hypertension Mother    Heart failure Mother    Hyperlipidemia Mother    Stroke Mother    Hypertension Father    Heart attack Father    COPD Father    Diabetes Paternal Aunt    Diabetes Paternal Grandmother    Colon polyps Other        niece   Stomach cancer Neg Hx    Rectal cancer Neg Hx    Esophageal cancer Neg Hx    Colon cancer Neg Hx     Social History:  reports that she has never smoked. She has never used smokeless tobacco. She reports that she does not currently use alcohol. She reports that she does not use drugs.  ROS: All other review of systems were reviewed and are negative except what is noted above in HPI  Physical Exam: BP 136/85   Pulse 71   Constitutional:  Alert and oriented, No acute distress. HEENT: [place] AT, moist mucus membranes.  Trachea midline, no masses. Cardiovascular: No clubbing, cyanosis, or edema. Respiratory: Normal respiratory effort, no increased work of breathing. GI: Abdomen is soft, nontender, nondistended, no abdominal masses GU: No CVA tenderness.  Lymph: No cervical or inguinal lymphadenopathy. Skin: No rashes, bruises or suspicious lesions. Neurologic: Grossly intact, no focal deficits, moving all 4 extremities. Psychiatric: Normal mood and affect.  Laboratory Data: Lab Results  Component Value Date   WBC 6.8 10/25/2021   HGB 13.6 10/25/2021   HCT 41.0 10/25/2021   MCV 92.8 10/25/2021   PLT 181 10/25/2021    Lab Results  Component Value Date   CREATININE 1.00 10/25/2021    No results found for: "PSA"  No results found for: "TESTOSTERONE"  No results found for: "HGBA1C"  Urinalysis    Component Value Date/Time   COLORURINE YELLOW 10/24/2021 0533   APPEARANCEUR CLEAR 10/24/2021 0533   LABSPEC 1.012 10/24/2021 0533   PHURINE 5.0 10/24/2021 0533   GLUCOSEU NEGATIVE 10/24/2021 0533   HGBUR NEGATIVE 10/24/2021 0533   BILIRUBINUR NEGATIVE 10/24/2021 0533   BILIRUBINUR negative 10/29/2016 1541    KETONESUR NEGATIVE 10/24/2021 0533   PROTEINUR NEGATIVE 10/24/2021 0533   UROBILINOGEN 0.2 10/29/2016 1541   UROBILINOGEN 0.2 07/21/2007 2218   NITRITE NEGATIVE 10/24/2021 0533   LEUKOCYTESUR NEGATIVE 10/24/2021 0533    Lab Results  Component Value Date   BACTERIA RARE (A) 09/17/2017    Pertinent Imaging:  Results for orders placed during the hospital encounter of 03/25/14  DG Abd 1 View  Narrative CLINICAL DATA:  Right lower quadrant pain for 1 day  EXAM: ABDOMEN - 1 VIEW  COMPARISON:  06/19/2012  FINDINGS: Cholecystectomy clips are noted in the right upper quadrant. The bowel gas pattern is normal. No radio-opaque calculi or other significant radiographic abnormality are seen.  IMPRESSION: Negative.   Electronically Signed By: Kerby Moors M.D. On: 03/25/2014 17:00  No results found for this or any previous visit.  No results found for this or any previous visit.  No results found for this or any previous visit.  No results found for this or any previous visit.  No results found for this or any previous visit.  No results found for this or any previous visit.  No results found for this or any previous visit.   Assessment & Plan:    1. Gross hematuria -resolved -continue macorbid 174m for the next 5 days - Bladder Voiding Trial  2. Urinary retention -RTC 1 month with PVR   No follow-ups on file.  PNicolette Bang MD  [facility] [place]

## 2021-11-08 NOTE — Patient Instructions (Signed)

## 2021-11-09 LAB — URINE CULTURE

## 2021-11-21 NOTE — Progress Notes (Signed)
Cardiology Office Note    Date:  11/29/2021   ID:  Wells, Gerdeman 1940/11/26, MRN [id number]   PCP:  Lubertha Sayres, [facility]  Cardiologist:  Fransico Him, MD   Advanced Practice Provider:  No care team member to display Electrophysiologist:  None   [id number]}   Chief Complaint  Patient presents with   Follow-up    History of Present Illness:  [name] is a 81 y.o. female with a history of anxiety, asthma, anemia, CKD status post left nephrectomy and GERD who saw Dr. Radford Pax 09/06/21 for severe LVEF 25% found on echo. Could be viral but stress PET CTA ordered. She was started on losartan with plans to transition to entresto if tolerated and slowly add carvedilol and spiro. No SGLPT2i due to recent UTI and no coronary CTA b/c of 1 kidney  Patient was since admitted to hospital with acute metabolic encephalopathy and myoclonic jerks that resolved. Gabapentin stopped and was to see if Risperdal could by stopped by psychiatrist.  Patient comes in with an aide from assisted living. She denies chest pain or shortness of breath. Gets dizzy if she gets up quickly but that was before losartan was started.  Past Medical History:  Diagnosis Date   Anemia 1990   hx of   Anxiety    Arthritis 2004   Asthma 1995   uses inhaler   Chronic kidney disease 2010   LEFT kidney removed   Colon polyp    Complication of anesthesia 1966   first surgery she had- had a hard time waking up from surgery   Diverticulosis 2003   GERD (gastroesophageal reflux disease)    on meds    Past Surgical History:  Procedure Laterality Date   APPENDECTOMY  1966   CATARACT EXTRACTION     Bilateral   CHOLECYSTECTOMY  2004   COLON SURGERY  2008   COLONOSCOPY  2013   JP-MAC-moviprep(good)-TA   COLOSTOMY     COLOSTOMY TAKEDOWN     DIAGNOSTIC LAPAROSCOPY     exploratory for endometriosis   HEEL SPUR SURGERY     Right, Metal Plate in [place]  2010   NEPHRECTOMY Left 1275   As complication of partial colectomy for diverticulitis   POLYPECTOMY  2013   TA   TONSILLECTOMY     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   Procedure: LEFT TOTAL KNEE ARTHROPLASTY;  Surgeon: Susa Day, MD;  Location: [facility];  Service: Orthopedics;  Laterality: Left;  Adductor Block    Current Medications: Current Meds  Medication Sig   amantadine (SYMMETREL) 100 MG capsule Take 100 mg by mouth 2 (two) times daily.   amoxicillin (AMOXIL) 500 MG capsule Take 500 mg by mouth 3 (three) times daily.   BREO ELLIPTA 200-25 MCG/INH AEPB Inhale 1 puff into the lungs daily.   cetirizine (ZYRTEC) 10 MG tablet Take 10 mg by mouth daily.   escitalopram (LEXAPRO) 10 MG tablet Take 10 mg by mouth daily.   fluticasone (FLONASE) 50 MCG/ACT nasal spray Place 2 sprays into both nostrils daily.   furosemide (LASIX) 20 MG tablet Take 20 mg by mouth 2 (two) times daily.   losartan (COZAAR) 25 MG tablet Take 1 tablet (25 mg total) by mouth daily. HOLD FOR SBP<95   melatonin 3 MG TABS tablet Take 3 mg by mouth at bedtime.   Multiple Vitamins-Minerals (PRESERVISION AREDS) TABS Take 1 tablet by mouth  in the morning and at bedtime.   omeprazole (PRILOSEC) 20 MG capsule Take 1 capsule (20 mg total) by mouth daily.   risperiDONE (RISPERDAL) 1 MG tablet Take 1 tablet (1 mg total) by mouth at bedtime.   traZODone (DESYREL) 150 MG tablet Take 150 mg by mouth at bedtime.     Allergies:   Haloperidol, Aripiprazole, and Chlorpheniramine-pse-ibuprofen   Social History   Socioeconomic History   Marital status: Legally Separated    Spouse name: Not on file   Number of children: 10   Years of education: Not on file   Highest education level: Not on file  Occupational History   Occupation: retired  Tobacco Use   Smoking status: Never   Smokeless tobacco: Never  Vaping Use   Vaping Use: Never used  Substance and Sexual Activity   Alcohol use: Not Currently     Comment: occassionally   Drug use: No   Sexual activity: Not Currently  Other Topics Concern   Not on file  Social History Narrative   Not on file   Social Determinants of Health   Financial Resource Strain: Not on file  Food Insecurity: Not on file  Transportation Needs: Not on file  Physical Activity: Not on file  Stress: Not on file  Social Connections: Not on file     Family History:  The patient's  family history includes COPD in her father; Colon polyps in an other family member; Diabetes in her paternal aunt and paternal grandmother; Heart attack in her father; Heart failure in her mother; Hyperlipidemia in her mother; Hypertension in her father and mother; Stroke in her mother.   ROS:   Please see the history of present illness.    ROS All other systems reviewed and are negative.   PHYSICAL EXAM:   VS:  BP 100/72 (BP Location: Left Arm, Patient Position: Sitting, Cuff Size: Large)   Pulse 73   Ht _0  (1.575 m)   Wt 179 lb 12.8 oz (81.6 kg)   SpO2 97%   BMI 32.89 kg/m   Physical Exam  GEN: Obese, in no acute distress  Neck: no JVD, carotid bruits, or masses Cardiac:RRR; no murmurs, rubs, or gallops  Respiratory:  clear to auscultation bilaterally, normal work of breathing GI: soft, nontender, nondistended, + BS Ext: without cyanosis, clubbing, or edema, Good distal pulses bilaterally Neuro:  Alert and Oriented x 3,  Psych: euthymic mood, full affect  Wt Readings from Last 3 Encounters:  11/29/21 179 lb 12.8 oz (81.6 kg)  10/24/21 175 lb 14.8 oz (79.8 kg)  09/06/21 176 lb (79.8 kg)      Studies/Labs Reviewed:   EKG:  EKG is not ordered today.    Recent Labs: 10/24/2021: TSH 0.519 10/25/2021: ALT 13; BUN 22; Creatinine, Ser 1.00; Hemoglobin 13.6; Platelets 181; Potassium 4.3; Sodium 133   Lipid Panel    Component Value Date/Time   CHOL 139 10/29/2016 1553   TRIG 141 10/29/2016 1553   HDL 43 10/29/2016 1553   CHOLHDL 3.2 10/29/2016 1553   LDLCALC  68 10/29/2016 1553    Additional studies/ records that were reviewed today include:      Risk Assessment/Calculations:         ASSESSMENT:    1. LV dysfunction   2. Nonrheumatic mitral valve regurgitation      PLAN:  In order of problems listed above:  Severe LVD EF 25% severely dilated LV etiology unclear-? Viral from covid 19. Stress PET CTA ordered  8/29. Started on losartan. BP running low so can't change to entresto or add other meds at this time. Will decrease lasix 20 mg daily and back to bid if 2-3 lb weight gain overnight.  Mild MR  CKD Left nephrectomy  Shared Decision Making/Informed Consent        Medication Adjustments/Labs and Tests Ordered: Current medicines are reviewed at length with the patient today.  Concerns regarding medicines are outlined above.  Medication changes, Labs and Tests ordered today are listed in the Patient Instructions below. There are no Patient Instructions on file for this visit.   Sumner Boast, PA-C  11/29/2021 11:32 AM    [facility] [address], [place], [place]  [address] Phone: [phone]; Fax: [phone]

## 2021-11-23 ENCOUNTER — Telehealth: Payer: Self-pay | Admitting: Cardiology

## 2021-11-23 NOTE — Telephone Encounter (Signed)
Niece called stating she will not be able to be at the patient's office visit on 8/18 and as the patient's legal guardian she would like a call back to be updated after the visit.  Niece also stated that in the patient's diagnosis list it is not noted that the patient has fixed delusional disorder.

## 2021-11-23 NOTE — Telephone Encounter (Addendum)
Attempted to reach POA, all numbers listed states VM are full. [phone] and [phone]    I did call [phone] again and Ms.Chambers answered and stated all conversations regarding appointments with patient should go through her and not [facility] where patient resides.    I spoke with patients nurse, Aram Beecham, who stated they need to be aware of patients  appointments as they are the ones that provide transportation for the patient. Aram Beecham stated she would speak with Ms.chambers.    I informed Ms.Josiah Lobo that we have patient scheduled for 11/29/21 with M.Lenze,PA-C

## 2021-11-24 ENCOUNTER — Ambulatory Visit: Payer: Medicare Other | Admitting: Cardiology

## 2021-11-29 ENCOUNTER — Ambulatory Visit (INDEPENDENT_AMBULATORY_CARE_PROVIDER_SITE_OTHER): Payer: Medicare Other | Admitting: Physician Assistant

## 2021-11-29 ENCOUNTER — Encounter: Payer: Self-pay | Admitting: Physician Assistant

## 2021-11-29 VITALS — BP 100/72 | HR 73 | Ht 62.0 in | Wt 179.8 lb

## 2021-11-29 DIAGNOSIS — I519 Heart disease, unspecified: Secondary | ICD-10-CM

## 2021-11-29 DIAGNOSIS — I340 Nonrheumatic mitral (valve) insufficiency: Secondary | ICD-10-CM

## 2021-11-29 MED ORDER — FUROSEMIDE 20 MG PO TABS: 20.0000 mg | ORAL_TABLET | Freq: Every day | ORAL | 11 refills | Status: DC

## 2021-11-29 NOTE — Patient Instructions (Signed)
Medication Instructions:  Decrease Lasix to 20 mg Daily  If patient gains 3 lbs overnight or 5 lbs in a week may increase lasix back to 20 mg Two Times Daily.   *If you need a refill on your cardiac medications before your next appointment, please call your pharmacy*   Lab Work: NONE   If you have labs (blood work) drawn today and your tests are completely normal, you will receive your results only by: [facility] (if you have MyChart) OR A paper copy in the mail If you have any lab test that is abnormal or we need to change your treatment, we will call you to review the results.   Testing/Procedures: NONE    Follow-Up: At [facility], you and your health needs are our priority.  As part of our continuing mission to provide you with exceptional heart care, we have created designated Provider Care Teams.  These Care Teams include your primary Cardiologist (physician) and Advanced Practice Providers (APPs -  Physician Assistants and Nurse Practitioners) who all work together to provide you with the care you need, when you need it.  We recommend signing up for the patient portal called "MyChart".  Sign up information is provided on this After Visit Summary.  MyChart is used to connect with patients for Virtual Visits (Telemedicine).  Patients are able to view lab/test results, encounter notes, upcoming appointments, etc.  Non-urgent messages can be sent to your provider as well.   To learn more about what you can do with MyChart, go to [email or web address].    Your next appointment:    Next Available   The format for your next appointment:   In Person  Provider:   You may see Fransico Him, MD or one of the following Advanced Practice Providers on your designated Care Team:   Bernerd Pho, PA-C  Ermalinda Barrios, PA-C    Other Instructions Thank you for choosing [facility]!    Important Information About [place]

## 2021-12-01 ENCOUNTER — Telehealth (HOSPITAL_COMMUNITY): Payer: Self-pay | Admitting: *Deleted

## 2021-12-01 ENCOUNTER — Telehealth: Payer: Self-pay

## 2021-12-01 ENCOUNTER — Encounter (HOSPITAL_COMMUNITY): Payer: Self-pay

## 2021-12-01 DIAGNOSIS — I519 Heart disease, unspecified: Secondary | ICD-10-CM

## 2021-12-01 NOTE — Telephone Encounter (Signed)
Reaching out to patient's nursing facility to offer assistance regarding upcoming cardiac imaging study; staff verbalizes understanding of appt date/time, parking situation and where to check in, pre-test NPO status and verified current allergies; name and call back number provided for further questions should they arise  Gordy Clement RN Navigator Cardiac [facility] [phone] office [phone] cell  Patient to hold BP meds starting Monday and to avoid caffeine 12 hours prior to her cardiac PET.

## 2021-12-01 NOTE — Telephone Encounter (Signed)
-----   Message from Eli Hose, RN sent at 12/01/2021 11:31 AM EDT ----- Regarding: Cardiac PET informed consent Dr. Radford Pax,  Can you please document an informed consent in the chart for this patient's upcoming cardiac PET on Tuesday, Aug 29?  Thanks, Kerin Ransom

## 2021-12-04 NOTE — Telephone Encounter (Signed)
Shared Decision Making/Informed Consent The risks [chest pain, shortness of breath, cardiac arrhythmias, dizziness, blood pressure fluctuations, myocardial infarction, stroke/transient ischemic attack, nausea, vomiting, allergic reaction, radiation exposure, metallic taste sensation and life-threatening complications (estimated to be 1 in 10,000)], benefits (risk stratification, diagnosing coronary artery disease, treatment guidance) and alternatives of a cardiac PET stress test were discussed in detail with [name] and she agrees to proceed.

## 2021-12-05 ENCOUNTER — Encounter (HOSPITAL_COMMUNITY)
Admission: RE | Admit: 2021-12-05 | Discharge: 2021-12-05 | Disposition: A | Discharge status: Home / Self Care | Payer: Medicare Other | Source: Ambulatory Visit | Attending: Cardiology | Admitting: Cardiology

## 2021-12-05 DIAGNOSIS — I519 Heart disease, unspecified: Secondary | ICD-10-CM | POA: Diagnosis present

## 2021-12-05 DIAGNOSIS — I429 Cardiomyopathy, unspecified: Secondary | ICD-10-CM | POA: Diagnosis not present

## 2021-12-05 DIAGNOSIS — I428 Other cardiomyopathies: Secondary | ICD-10-CM | POA: Diagnosis not present

## 2021-12-05 DIAGNOSIS — I5189 Other ill-defined heart diseases: Secondary | ICD-10-CM | POA: Diagnosis not present

## 2021-12-05 DIAGNOSIS — R9439 Abnormal result of other cardiovascular function study: Secondary | ICD-10-CM | POA: Diagnosis not present

## 2021-12-05 HISTORY — DX: Schizophrenia, unspecified: F20.9

## 2021-12-05 HISTORY — DX: Delusional disorders: F22

## 2021-12-05 LAB — NM PET CT CARDIAC PERFUSION MULTI W/ABSOLUTE BLOODFLOW
LV dias vol: 196 mL (ref 46–106)
LV sys vol: 128 mL
MBFR: 1.51
Nuc Rest EF: 28 %
Nuc Stress EF: 35 %
Rest MBF: 0.86 ml/g/min
Rest Nuclear Isotope Dose: 21.2 mCi
ST Depression (mm): 0 mm
Stress MBF: 1.3 ml/g/min
Stress Nuclear Isotope Dose: 21.4 mCi
TID: 1.15

## 2021-12-05 MED ORDER — REGADENOSON 0.4 MG/5ML IV SOLN
0.4000 mg | Freq: Once | INTRAVENOUS | Status: AC
Start: 2021-12-05 — End: 2021-12-05

## 2021-12-05 MED ORDER — RUBIDIUM RB82 GENERATOR (RUBYFILL)
25.0000 | PACK | Freq: Once | INTRAVENOUS | Status: AC
  Administered 2021-12-05: 21.2 via INTRAVENOUS

## 2021-12-05 MED ORDER — REGADENOSON 0.4 MG/5ML IV SOLN
INTRAVENOUS | Status: AC
  Administered 2021-12-05: 0.4 mg via INTRAVENOUS
  Filled 2021-12-05: qty 5

## 2021-12-05 MED ORDER — RUBIDIUM RB82 GENERATOR (RUBYFILL)
25.0000 | PACK | Freq: Once | INTRAVENOUS | Status: AC
  Administered 2021-12-05: 21.4 via INTRAVENOUS

## 2021-12-05 NOTE — Progress Notes (Signed)
Patient presents with a cardiac stress PET test. Patient tolerated the test well. Pt reports some minor lightheadedness but it quickly resolved. Vital signs remain stable throughout test. Patient was offered caffeine and escorted back to the the front desk where the staff from her facility took her home.

## 2021-12-12 ENCOUNTER — Telehealth: Payer: Self-pay

## 2021-12-12 ENCOUNTER — Telehealth: Payer: Self-pay | Admitting: Cardiology

## 2021-12-12 DIAGNOSIS — R931 Abnormal findings on diagnostic imaging of heart and coronary circulation: Secondary | ICD-10-CM

## 2021-12-12 DIAGNOSIS — I519 Heart disease, unspecified: Secondary | ICD-10-CM

## 2021-12-12 NOTE — Telephone Encounter (Signed)
Elizabeth Margarita, MD  12/08/2021  8:56 AM EDT     Discussed results with Dr. Julieanne Manson- PET CT stress test showed no evidence of ischemia.  Please order a cMRI to rule out infiltrative myocardial process.  Please order a SPEP and UPEP, HIV and ferritin levels please    Liver cysts noted on non cardiac portion of Cardiac PET CT - forward to PCP to review

## 2021-12-12 NOTE — Telephone Encounter (Signed)
Pt's daughter returning call regarding test results for pt. Please advise

## 2021-12-12 NOTE — Telephone Encounter (Signed)
Niece notified and verbalized understanding.

## 2021-12-12 NOTE — Telephone Encounter (Signed)
-----   Message from Antonieta Iba, RN sent at 12/08/2021 10:34 AM EDT ----- Left message for patient's daughter to call back

## 2021-12-12 NOTE — Progress Notes (Signed)
Cardiology Office Note:    Date:  12/19/2021   ID:  [name], DOB Feb 25, 1941, MRN [id number]  PCP:  Lubertha Sayres, [facility] Cardiologist:  Fransico Him, MD     Referring MD: Lubertha Sayres, FNP   Chief Complaint:  Follow-up and Dizziness     History of Present Illness:   [name] is a 81 y.o. female with a history of anxiety, asthma, anemia, CKD status post left nephrectomy and GERD who saw Dr. Radford Pax 09/06/21 for severe LVEF 25% found on echo. Could be viral but stress PET CTA ordered. She was started on losartan with plans to transition to entresto if tolerated and slowly add carvedilol and spiro. No SGLPT2i due to recent UTI and no coronary CTA b/c of 1 kidney  Patient was since admitted to hospital with acute metabolic encephalopathy and myoclonic jerks that resolved. Gabapentin stopped and was to see if Risperdal could by stopped by psychiatrist.    I saw the patient 11/29/21 and BP running low so I decreased lasix 20 mg daily.  PET CT stress showed no ischemia. Liver cysts noted on non cardiac portion to be f/u PCP.Dr. Radford Pax ordered cMRI to rule out infiltrative myocardial process.  Patient comes in with aide. Gets very dizzy when she stands up. Fell in the bathroom-stood up from toilet and became dizzy and fell against towel rack-bruises on her arm. When she gets up in the middle of the night she has to sit for a few minutes before getting up.    Past Medical History:  Diagnosis Date   Anemia 1990   hx of   Anxiety    Arthritis 2004   Asthma 1995   uses inhaler   Chronic kidney disease 2010   LEFT kidney removed   Colon polyp    Complication of anesthesia 1966   first surgery she had- had a hard time waking up from surgery   Delusional disorder ([place])    Six delusional disorder   Diverticulosis 2003   GERD (gastroesophageal reflux disease)    on meds   Schizophrenia ([place])    Current Medications: Current Meds  Medication Sig    acetaminophen (TYLENOL) 325 MG tablet Take 650 mg by mouth every 6 (six) hours as needed.   amantadine (SYMMETREL) 100 MG capsule Take 100 mg by mouth 2 (two) times daily.   BREO ELLIPTA 200-25 MCG/INH AEPB Inhale 1 puff into the lungs daily.   cetirizine (ZYRTEC) 10 MG tablet Take 10 mg by mouth daily.   cyanocobalamin 1000 MCG tablet Take 1,000 mcg by mouth daily.   escitalopram (LEXAPRO) 10 MG tablet Take 10 mg by mouth daily.   fluticasone (FLONASE) 50 MCG/ACT nasal spray Place 2 sprays into both nostrils daily.   gabapentin (NEURONTIN) 100 MG capsule Take 100 mg by mouth at bedtime.   melatonin 3 MG TABS tablet Take 3 mg by mouth at bedtime.   Multiple Vitamins-Minerals (PRESERVISION AREDS) TABS Take 1 tablet by mouth in the morning and at bedtime.   omeprazole (PRILOSEC) 20 MG capsule Take 1 capsule (20 mg total) by mouth daily.   risperiDONE (RISPERDAL) 1 MG tablet Take 1 tablet (1 mg total) by mouth at bedtime.   traZODone (DESYREL) 150 MG tablet Take 150 mg by mouth at bedtime.   [DISCONTINUED] furosemide (LASIX) 20 MG tablet Take 1 tablet (20 mg total) by mouth daily.   [DISCONTINUED] losartan (COZAAR) 25 MG tablet Take 1 tablet (25 mg total)  by mouth daily. HOLD FOR SBP<95    Allergies:   Haloperidol, Aripiprazole, and Chlorpheniramine-pse-ibuprofen   Social History   Tobacco Use   Smoking status: Never   Smokeless tobacco: Never  Vaping Use   Vaping Use: Never used  Substance Use Topics   Alcohol use: Not Currently    Comment: occassionally   Drug use: No    Family Hx: The patient's family history includes COPD in her father; Colon polyps in an other family member; Diabetes in her paternal aunt and paternal grandmother; Heart attack in her father; Heart failure in her mother; Hyperlipidemia in her mother; Hypertension in her father and mother; Stroke in her mother. There is no history of Stomach cancer, Rectal cancer, Esophageal cancer, or Colon cancer.  ROS    EKGs/Labs/Other Test Reviewed:    EKG:  EKG is  not ordered today.    Recent Labs: 10/24/2021: TSH 0.519 10/25/2021: ALT 13; BUN 22; Creatinine, Ser 1.00; Hemoglobin 13.6; Platelets 181; Potassium 4.3; Sodium 133   Recent Lipid Panel No results for input(s): "CHOL", "TRIG", "HDL", "VLDL", "LDLCALC", "LDLDIRECT" in the last 8760 hours.   Prior CV Studies:   PET stress 12/05/21  Rest left ventricular function is abnormal. Rest global function is severely reduced. There were no regional wall motion abnormalities. Rest EF: 28 %. Stress left ventricular function is abnormal. Stress global function is severely reduced. There were no regional wall abnormalities. Stress EF: 35 %. End diastolic cavity size is severely enlarged. End systolic cavity size is normal.   Myocardial blood flow was computed to be 0.41m/g/min at rest and 1.38mg/min at stress. Global myocardial blood flow reserve was 1.51 and was abnormal. This finding can be seen in both microvascular disease and in severe LV dysfunction; given LVEF at rest, the latter is suspected.   Coronary calcium was present on the attenuation correction CT images. Severe coronary calcifications were present. Coronary calcifications were present in the left anterior descending artery and right coronary artery distribution(s).   Findings are consistent with no prior ischemia. The study is high risk due to LVEF and, to a lesser extent, due to MBFR.   Read by MaRudean HaskellD FASE   CLINICAL DATA:  This over-read does not include interpretation of cardiac or coronary anatomy or pathology. The Cardiac PET CT interpretation by the cardiologist is attached.   COMPARISON:  [facility] 05/08/2020   FINDINGS: Vascular: No acute abnormality.   Mediastinum/Nodes: No mass or adenopathy identified. Visualized portions of the lower airway and esophagus are unremarkable.   Lungs/Pleura: Cardiac enlargement is identified which has mass effect upon the  lingula and left lower lobe with adjacent passive atelectasis. No signs of pleural effusion or airspace consolidation. No suspicious pulmonary nodule or mass.   Upper Abdomen: There are multiple cysts identified within both lobes of the liver. The largest is in the right lobe and measures at least 9.7 cm, image 83/3. These appear unchanged compared with the previous exam.   Musculoskeletal: No acute or suspicious osseous findings. Thoracic degenerative disc disease noted.   IMPRESSION: 1. No significant noncardiac supplemental findings. 2. Liver cysts.     Electronically Signed   By: TaKerby Moors.D.   On: 12/05/2021 12:05      Risk Assessment/Calculations/Metrics:              Physical Exam:    VS:  BP (!) 112/58   Pulse 72   Ht '5\' 2"'$  (1.575 m)   Wt 179 lb (  81.2 kg)   SpO2 95%   BMI 32.74 kg/m     Wt Readings from Last 3 Encounters:  12/19/21 179 lb (81.2 kg)  11/29/21 179 lb 12.8 oz (81.6 kg)  10/24/21 175 lb 14.8 oz (79.8 kg)    Physical Exam  GEN: Well nourished, well developed, in no acute distress  Neck: no JVD, carotid bruits, or masses Cardiac:RRR; no murmurs, rubs, or gallops  Respiratory:  clear to auscultation bilaterally, normal work of breathing GI: soft, nontender, nondistended, + BS Ext: without cyanosis, clubbing, or edema, Good distal pulses bilaterally Neuro:  Alert and Oriented x 3,  Psych: euthymic mood, full affect       ASSESSMENT & PLAN:    Severe LVD EF 25% severely dilated LV etiology unclear-? Viral from covid 19. Stress PET CTA ordered 8/29. PET stress no ischemia, cMRI ordered by Dr. Radford Pax.Started on losartan. BP running low so can't change to entresto or add other meds at this time. I decreased lasix 20 mg daily LOV . Patient is orthostatic and fell in BR yest. BP dropped 118/70 to 80/50 sitting to standing in the office. Stop lasix and losartan. Check bmet and cbc, compression stockings, increase water intake. Position  changes reviewed with patient. F/u in 2-3 weeks to see if we can try low dose losartan. I spoke with her niece Nira Conn after appt and discussed in detail with her as well.  Orthostatic hypotension as above limiting heart failure treatment.   Mild MR   CKD Left nephrectomy              Dispo:  No follow-ups on file.   Medication Adjustments/Labs and Tests Ordered: Current medicines are reviewed at length with the patient today.  Concerns regarding medicines are outlined above.  Tests Ordered: Orders Placed This Encounter  Procedures   Basic metabolic panel   CBC   Medication Changes: No orders of the defined types were placed in this encounter.  Sumner Boast, PA-C  12/19/2021 12:30 PM    [facility], [place], [place]  [address] Phone: [phone]; Fax: [phone]

## 2021-12-12 NOTE — Telephone Encounter (Signed)
Labs ordered for [place] ordered per providers request.

## 2021-12-12 NOTE — Telephone Encounter (Signed)
Spoke to pt's niece (ok per DPR) please see result note.

## 2021-12-13 ENCOUNTER — Ambulatory Visit (INDEPENDENT_AMBULATORY_CARE_PROVIDER_SITE_OTHER): Payer: Medicare Other | Admitting: Urology

## 2021-12-13 ENCOUNTER — Encounter: Payer: Self-pay | Admitting: Urology

## 2021-12-13 VITALS — BP 126/76 | HR 97

## 2021-12-13 DIAGNOSIS — R339 Retention of urine, unspecified: Secondary | ICD-10-CM | POA: Diagnosis not present

## 2021-12-13 LAB — URINALYSIS, ROUTINE W REFLEX MICROSCOPIC
Bilirubin, UA: NEGATIVE
Glucose, UA: NEGATIVE
Ketones, UA: NEGATIVE
Leukocytes,UA: NEGATIVE
Nitrite, UA: NEGATIVE
Protein,UA: NEGATIVE
RBC, UA: NEGATIVE
Specific Gravity, UA: <1.005 — ABNORMAL LOW (ref 1.005–1.030)
Urobilinogen, Ur: 0.2 mg/dL (ref 0.2–1.0)
pH, UA: 5.5 (ref 5.0–7.5)

## 2021-12-13 LAB — BLADDER SCAN AMB NON-IMAGING: Scan Result: 25

## 2021-12-13 NOTE — Patient Instructions (Signed)
Acute Urinary Retention, Female  Acute urinary retention is a condition in which a person is unable to pass urine or can only pass a little urine. This condition can happen suddenly and last for a short time. If left untreated, it can become long-term (chronic) and result in kidney damage or other serious complications. What are the causes? This condition may be caused by: Obstruction or narrowing of the tube that drains the bladder (urethra). This may be caused by surgery, problems with nearby organs, or injury to the bladder or urethra. Problems with the nerves in the bladder. Pelvic organ prolapse. Tumors in the area of the pelvis, bladder, or urethra. Vaginal childbirth. Bladder or urinary tract infection. Constipation. Certain medicines. What increases the risk? This condition is more likely to develop in women over age 50. Other chronic health conditions can increase the risk of acute urinary retention. These include: Diseases such as multiple sclerosis. Spinal cord injuries. Diabetes. Degenerative cognitive conditions, such as delirium or dementia. Psychological conditions. A woman may hold her urine due to trauma or because she does not want to use the bathroom. History of preexisting urinary retention. History of prior pelvic surgery, incontinence surgery, or radical pelvic surgery. What are the signs or symptoms? Symptoms of this condition include: Trouble urinating. Pain in the lower abdomen. How is this diagnosed? This condition is diagnosed based on a physical exam and your medical history. You may also have other tests, including: An ultrasound of the bladder or kidneys or both. Blood tests. A urine analysis. Additional tests may be needed, such as a CT scan, MRI, and kidney or bladder function tests. How is this treated? Treatment for this condition may include: Medicines. Placing a thin, sterile tube (catheter) into the bladder to drain urine out of the body. This  is called an indwelling urinary catheter. After it is inserted, the catheter is held in place with a small balloon that is filled with sterile water. Urine drains from the catheter into a collection bag outside of the body. Behavioral therapy. Treatment for other conditions. If needed, you may be treated in the hospital for kidney function problems or to manage other complications. Follow these instructions at home: Medicines Take over-the-counter and prescription medicines only as told by your health care provider. Avoid certain medicines, such as decongestants, antihistamines, and some prescription medicines. Do not take any medicine unless your health care provider approves. If you were prescribed an antibiotic medicine, take it as told by your health care provider. Do not stop using the antibiotic even if you start to feel better. General instructions Do not use any products that contain nicotine or tobacco. These products include cigarettes, chewing tobacco, and vaping devices, such as e-cigarettes. If you need help quitting, ask your health care provider. Drink enough fluid to keep your urine pale yellow. If you have an indwelling urinary catheter, follow the instructions from your health care provider. Monitor any changes in your symptoms. Tell your health care provider about any changes. If instructed, monitor your blood pressure at home. Report changes as told by your health care provider. Keep all follow-up visits. This is important. Contact a health care provider if: You have uncomfortable bladder contractions that you cannot control (spasms). You leak urine with the spasms. Get help right away if: You have chills or a fever. You have blood in your urine. You have a catheter and the following happens: Your catheter stops draining urine. Your catheter falls out. Summary Acute urinary retention is a   condition in which a person is unable to pass urine or can only pass a little urine.  If left untreated, this can result in kidney damage or other serious complications. One cause of this condition may be obstruction or narrowing of the tube that drains the bladder (urethra). This may be caused by surgery, problems with nearby organs, or injury to the bladder or urethra. Treatment may include medicines and placement of an indwelling urinary catheter. Monitor any changes in your symptoms. Tell your health care provider about any changes. This information is not intended to replace advice given to you by your health care provider. Make sure you discuss any questions you have with your health care provider. Document Revised: 12/16/2019 Document Reviewed: 12/16/2019 Elsevier Patient Education  2023 Elsevier Inc.  

## 2021-12-13 NOTE — Progress Notes (Signed)
12/13/2021 10:35 AM   [name] 1940/06/25 [id number]  Referring provider: Lubertha Sayres, [address] [place],  [place] [address]  Followup urinary retention and gross hematuria   HPI: [name] is a 81yo here for followup for urinary retention and gross hematuria. PVR 25cc. She has urinary frequency in the morning and afternoon after she takes lasix. No gross hematuria. She drinks 12-20 oz of water within 2 ours of going to bed. Urine stream strong. NO straining to urinate.    PMH: Past Medical History:  Diagnosis Date   Anemia 1990   hx of   Anxiety    Arthritis 2004   Asthma 1995   uses inhaler   Chronic kidney disease 2010   LEFT kidney removed   Colon polyp    Complication of anesthesia 1966   first surgery she had- had a hard time waking up from surgery   Delusional disorder ([place])    Six delusional disorder   Diverticulosis 2003   GERD (gastroesophageal reflux disease)    on meds   Schizophrenia [facility])     Surgical History: Past Surgical History:  Procedure Laterality Date   APPENDECTOMY  1966   CATARACT EXTRACTION     Bilateral   CHOLECYSTECTOMY  2004   COLON SURGERY  2008   COLONOSCOPY  2013   JP-MAC-moviprep(good)-TA   COLOSTOMY     COLOSTOMY TAKEDOWN     DIAGNOSTIC LAPAROSCOPY     exploratory for endometriosis   HEEL SPUR SURGERY     Right, Metal Plate in Heel   [place]  2010   NEPHRECTOMY Left 1914   As complication of partial colectomy for diverticulitis   POLYPECTOMY  2013   TA   TONSILLECTOMY     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   Procedure: LEFT TOTAL KNEE ARTHROPLASTY;  Surgeon: Susa Day, MD;  Location: [facility];  Service: Orthopedics;  Laterality: Left;  Adductor Block    Home Medications:  Allergies as of 12/13/2021       Reactions   Haloperidol Anaphylaxis   Aripiprazole Other (See Comments)   Bad thoughts    Chlorpheniramine-pse-ibuprofen    Other reaction(s): Cough        Medication List         Accurate as of December 13, 2021 10:35 AM. If you have any questions, ask your nurse or doctor.          acetaminophen 325 MG tablet Commonly known as: TYLENOL Take 650 mg by mouth every 6 (six) hours as needed.   amantadine 100 MG capsule Commonly known as: SYMMETREL Take 100 mg by mouth 2 (two) times daily.   amoxicillin 500 MG capsule Commonly known as: AMOXIL Take 500 mg by mouth 3 (three) times daily.   Breo Ellipta 200-25 MCG/ACT Aepb Generic drug: fluticasone furoate-vilanterol Inhale 1 puff into the lungs daily.   cetirizine 10 MG tablet Commonly known as: ZYRTEC Take 10 mg by mouth daily.   COMBIVENT IN Inhale 1 puff into the lungs every 6 (six) hours as needed (wheezing).   escitalopram 10 MG tablet Commonly known as: LEXAPRO Take 10 mg by mouth daily.   fluticasone 50 MCG/ACT nasal spray Commonly known as: FLONASE Place 2 sprays into both nostrils daily.   furosemide 20 MG tablet Commonly known as: Lasix Take 1 tablet (20 mg total) by mouth daily.   ibuprofen 400 MG tablet Commonly known as: ADVIL Take 1 tablet (400 mg total) by mouth every 8 (  eight) hours as needed for moderate pain, fever or cramping.   loperamide 2 MG capsule Commonly known as: IMODIUM Take 4 mg by mouth as needed for diarrhea or loose stools.   losartan 25 MG tablet Commonly known as: COZAAR Take 1 tablet (25 mg total) by mouth daily. HOLD FOR SBP<95   melatonin 3 MG Tabs tablet Take 3 mg by mouth at bedtime.   nitrofurantoin (macrocrystal-monohydrate) 100 MG capsule Commonly known as: MACROBID Take 1 capsule (100 mg total) by mouth every 12 (twelve) hours.   omeprazole 20 MG capsule Commonly known as: PRILOSEC Take 1 capsule (20 mg total) by mouth daily.   PreserVision AREDS Tabs Take 1 tablet by mouth in the morning and at bedtime.   risperiDONE 1 MG tablet Commonly known as: RISPERDAL Take 1 tablet (1 mg total) by mouth at bedtime.   traZODone 150 MG  tablet Commonly known as: DESYREL Take 150 mg by mouth at bedtime.        Allergies:  Allergies  Allergen Reactions   Haloperidol Anaphylaxis   Aripiprazole Other (See Comments)    Bad thoughts    Chlorpheniramine-Pse-Ibuprofen     Other reaction(s): Cough    Family History: Family History  Problem Relation Age of Onset   Hypertension Mother    Heart failure Mother    Hyperlipidemia Mother    Stroke Mother    Hypertension Father    Heart attack Father    COPD Father    Diabetes Paternal Aunt    Diabetes Paternal Grandmother    Colon polyps Other        niece   Stomach cancer Neg Hx    Rectal cancer Neg Hx    Esophageal cancer Neg Hx    Colon cancer Neg Hx     Social History:  reports that she has never smoked. She has never used smokeless tobacco. She reports that she does not currently use alcohol. She reports that she does not use drugs.  ROS: All other review of systems were reviewed and are negative except what is noted above in HPI  Physical Exam: BP 126/76   Pulse 97   Constitutional:  Alert and oriented, No acute distress. HEENT: [place] AT, moist mucus membranes.  Trachea midline, no masses. Cardiovascular: No clubbing, cyanosis, or edema. Respiratory: Normal respiratory effort, no increased work of breathing. GI: Abdomen is soft, nontender, nondistended, no abdominal masses GU: No CVA tenderness.  Lymph: No cervical or inguinal lymphadenopathy. Skin: No rashes, bruises or suspicious lesions. Neurologic: Grossly intact, no focal deficits, moving all 4 extremities. Psychiatric: Normal mood and affect.  Laboratory Data: Lab Results  Component Value Date   WBC 6.8 10/25/2021   HGB 13.6 10/25/2021   HCT 41.0 10/25/2021   MCV 92.8 10/25/2021   PLT 181 10/25/2021    Lab Results  Component Value Date   CREATININE 1.00 10/25/2021    No results found for: "PSA"  No results found for: "TESTOSTERONE"  No results found for: "HGBA1C"  Urinalysis     Component Value Date/Time   COLORURINE YELLOW 10/24/2021 0533   APPEARANCEUR CLEAR 10/24/2021 0533   LABSPEC 1.012 10/24/2021 0533   PHURINE 5.0 10/24/2021 0533   GLUCOSEU NEGATIVE 10/24/2021 0533   HGBUR NEGATIVE 10/24/2021 0533   BILIRUBINUR NEGATIVE 10/24/2021 0533   BILIRUBINUR negative 10/29/2016 1541   KETONESUR NEGATIVE 10/24/2021 0533   PROTEINUR NEGATIVE 10/24/2021 0533   UROBILINOGEN 0.2 10/29/2016 1541   UROBILINOGEN 0.2 07/21/2007 2218   NITRITE NEGATIVE  10/24/2021 0533   LEUKOCYTESUR NEGATIVE 10/24/2021 0533    Lab Results  Component Value Date   BACTERIA RARE (A) 09/17/2017    Pertinent Imaging:  Results for orders placed during the hospital encounter of 03/25/14  DG Abd 1 View  Narrative CLINICAL DATA:  Right lower quadrant pain for 1 day  EXAM: ABDOMEN - 1 VIEW  COMPARISON:  06/19/2012  FINDINGS: Cholecystectomy clips are noted in the right upper quadrant. The bowel gas pattern is normal. No radio-opaque calculi or other significant radiographic abnormality are seen.  IMPRESSION: Negative.   Electronically Signed By: Kerby Moors M.D. On: 03/25/2014 17:00  No results found for this or any previous visit.  No results found for this or any previous visit.  No results found for this or any previous visit.  No results found for this or any previous visit.  No results found for this or any previous visit.  No results found for this or any previous visit.  No results found for this or any previous visit.   Assessment & Plan:    1. Urinary retention -RTC 6 months  with PVR - BLADDER SCAN AMB NON-IMAGING - Urinalysis, Routine w reflex microscopic   No follow-ups on file.  Nicolette Bang, MD  [facility] [place]

## 2021-12-13 NOTE — Progress Notes (Signed)
post void residual =25 

## 2021-12-19 ENCOUNTER — Encounter: Payer: Self-pay | Admitting: Physician Assistant

## 2021-12-19 ENCOUNTER — Ambulatory Visit: Payer: Medicare Other | Attending: Physician Assistant | Admitting: Physician Assistant

## 2021-12-19 VITALS — BP 112/58 | HR 72 | Ht 62.0 in | Wt 179.0 lb

## 2021-12-19 DIAGNOSIS — I951 Orthostatic hypotension: Secondary | ICD-10-CM

## 2021-12-19 DIAGNOSIS — Z905 Acquired absence of kidney: Secondary | ICD-10-CM

## 2021-12-19 DIAGNOSIS — I519 Heart disease, unspecified: Secondary | ICD-10-CM

## 2021-12-19 DIAGNOSIS — I340 Nonrheumatic mitral (valve) insufficiency: Secondary | ICD-10-CM

## 2021-12-19 NOTE — Patient Instructions (Addendum)
Medication Instructions:  1.Stop lasix 2.Stop losartan *If you need a refill on your cardiac medications before your next appointment, please call your pharmacy*   Lab Work: BMET and CBC today If you have labs (blood work) drawn today and your tests are completely normal, you will receive your results only by: [place] (if you have MyChart) OR A paper copy in the mail If you have any lab test that is abnormal or we need to change your treatment, we will call you to review the results.  Follow-Up: At [facility], you and your health needs are our priority.  As part of our continuing mission to provide you with exceptional heart care, we have created designated Provider Care Teams.  These Care Teams include your primary Cardiologist (physician) and Advanced Practice Providers (APPs -  Physician Assistants and Nurse Practitioners) who all work together to provide you with the care you need, when you need it.  We recommend signing up for the patient portal called "MyChart".  Sign up information is provided on this After Visit Summary.  MyChart is used to connect with patients for Virtual Visits (Telemedicine).  Patients are able to view lab/test results, encounter notes, upcoming appointments, etc.  Non-urgent messages can be sent to your provider as well.   To learn more about what you can do with MyChart, go to [email or web address].    Your next appointment:   Schedule with Dr Radford Pax or Ermalinda Barrios, PA-C in 2-3 weeks in the [place] office  Other Instructions 1.Have the facility check orthostatic blood pressures on you 2.We have provided you with an order for compression hose  Important Information About [place]

## 2021-12-20 LAB — BASIC METABOLIC PANEL
BUN/Creatinine Ratio: 14 (ref 12–28)
BUN: 13 mg/dL (ref 8–27)
CO2: 22 mmol/L (ref 20–29)
Calcium: 8.8 mg/dL (ref 8.7–10.3)
Chloride: 97 mmol/L (ref 96–106)
Creatinine, Ser: 0.92 mg/dL (ref 0.57–1.00)
Glucose: 102 mg/dL — ABNORMAL HIGH (ref 70–99)
Potassium: 4.3 mmol/L (ref 3.5–5.2)
Sodium: 135 mmol/L (ref 134–144)
eGFR: 63 mL/min/{1.73_m2} (ref >59)

## 2021-12-20 LAB — CBC
Hematocrit: 38.9 % (ref 34.0–46.6)
Hemoglobin: 13.4 g/dL (ref 11.1–15.9)
MCH: 31.7 pg (ref 26.6–33.0)
MCHC: 34.4 g/dL (ref 31.5–35.7)
MCV: 92 fL (ref 79–97)
Platelets: 250 10*3/uL (ref 150–450)
RBC: 4.23 x10E6/uL (ref 3.77–5.28)
RDW: 12 % (ref 11.7–15.4)
WBC: 6.8 10*3/uL (ref 3.4–10.8)

## 2022-01-02 NOTE — Progress Notes (Signed)
Cardiology Office Note:    Date:  01/09/2022   ID:  [name], DOB 1940-09-01, MRN [id number]  PCP:  Lubertha Sayres, [place] [facility] Cardiologist:  Fransico Him, MD     Referring MD: Lubertha Sayres, FNP   Chief Complaint:  No chief complaint on file.     History of Present Illness:   [name] is a 81 y.o. female with  a history of anxiety, asthma, anemia, CKD status post left nephrectomy and GERD who saw Dr. Radford Pax 09/06/21 for severe LVEF 25% found on echo. Could be viral but stress PET CTA ordered. She was started on losartan with plans to transition to entresto if tolerated and slowly add carvedilol and spiro. No SGLPT2i due to recent UTI and no coronary CTA b/c of 1 kidney   Patient was since admitted to hospital with acute metabolic encephalopathy and myoclonic jerks that resolved. Gabapentin stopped and was to see if Risperdal could by stopped by psychiatrist.     I saw the patient 11/29/21 and BP running low so I decreased lasix 20 mg daily.  PET CT stress showed no ischemia. Liver cysts noted on non cardiac portion to be f/u PCP.Dr. Radford Pax ordered cMRI to rule out infiltrative myocardial process.  I saw patient 12/19/21 with dizziness and fall and was orthostatic. I stopped lasix and losartan, compression hose, increase water.   Patient comes in for f/u. Has increased her hydration with water. Hasn't gotten compression stockings yet. Weights up and down at facility but she can't stand steady on the scale and they are weighing her after dinner. Still feels dehydrated. Drinking 6-8 cups daily.           Past Medical History:  Diagnosis Date   Anemia 1990   hx of   Anxiety    Arthritis 2004   Asthma 1995   uses inhaler   Chronic kidney disease 2010   LEFT kidney removed   Colon polyp    Complication of anesthesia 1966   first surgery she had- had a hard time waking up from surgery   Delusional disorder ([place])    Six delusional disorder    Diverticulosis 2003   GERD (gastroesophageal reflux disease)    on meds   Schizophrenia ([place])    Current Medications: Current Meds  Medication Sig   acetaminophen (TYLENOL) 325 MG tablet Take 650 mg by mouth every 6 (six) hours as needed.   amantadine (SYMMETREL) 100 MG capsule Take 100 mg by mouth 2 (two) times daily.   antiseptic oral rinse (BIOTENE) LIQD 15 mLs by Mouth Rinse route as needed for dry mouth.   BREO ELLIPTA 200-25 MCG/INH AEPB Inhale 1 puff into the lungs daily.   cetirizine (ZYRTEC) 10 MG tablet Take 10 mg by mouth daily.   cyanocobalamin 1000 MCG tablet Take 1,000 mcg by mouth daily.   escitalopram (LEXAPRO) 10 MG tablet Take 10 mg by mouth daily.   fluticasone (FLONASE) 50 MCG/ACT nasal spray Place 2 sprays into both nostrils daily.   gabapentin (NEURONTIN) 100 MG capsule Take 100 mg by mouth at bedtime.   melatonin 3 MG TABS tablet Take 3 mg by mouth at bedtime.   Multiple Vitamins-Minerals (PRESERVISION AREDS) TABS Take 1 tablet by mouth in the morning and at bedtime.   omeprazole (PRILOSEC) 20 MG capsule Take 1 capsule (20 mg total) by mouth daily.   risperiDONE (RISPERDAL) 1 MG tablet Take 1 tablet (1 mg total) by mouth at bedtime.  traZODone (DESYREL) 150 MG tablet Take 150 mg by mouth at bedtime.    Allergies:   Haloperidol, Aripiprazole, and Chlorpheniramine-pse-ibuprofen   Social History   Tobacco Use   Smoking status: Never   Smokeless tobacco: Never  Vaping Use   Vaping Use: Never used  Substance Use Topics   Alcohol use: Not Currently    Comment: occassionally   Drug use: No    Family Hx: The patient's family history includes COPD in her father; Colon polyps in an other family member; Diabetes in her paternal aunt and paternal grandmother; Heart attack in her father; Heart failure in her mother; Hyperlipidemia in her mother; Hypertension in her father and mother; Stroke in her mother. There is no history of Stomach cancer, Rectal cancer,  Esophageal cancer, or Colon cancer.  ROS   EKGs/Labs/Other Test Reviewed:    EKG:  EKG is  not ordered today.     Recent Labs: 10/24/2021: TSH 0.519 10/25/2021: ALT 13 12/19/2021: BUN 13; Creatinine, Ser 0.92; Hemoglobin 13.4; Platelets 250; Potassium 4.3; Sodium 135   Recent Lipid Panel No results for input(s): "CHOL", "TRIG", "HDL", "VLDL", "LDLCALC", "LDLDIRECT" in the last 8760 hours.   Prior CV Studies:   PET stress 12/05/21  Rest left ventricular function is abnormal. Rest global function is severely reduced. There were no regional wall motion abnormalities. Rest EF: 28 %. Stress left ventricular function is abnormal. Stress global function is severely reduced. There were no regional wall abnormalities. Stress EF: 35 %. End diastolic cavity size is severely enlarged. End systolic cavity size is normal.   Myocardial blood flow was computed to be 0.28m/g/min at rest and 1.366mg/min at stress. Global myocardial blood flow reserve was 1.51 and was abnormal. This finding can be seen in both microvascular disease and in severe LV dysfunction; given LVEF at rest, the latter is suspected.   Coronary calcium was present on the attenuation correction CT images. Severe coronary calcifications were present. Coronary calcifications were present in the left anterior descending artery and right coronary artery distribution(s).   Findings are consistent with no prior ischemia. The study is high risk due to LVEF and, to a lesser extent, due to MBFR.   Read by MaRudean HaskellD FASE   CLINICAL DATA:  This over-read does not include interpretation of cardiac or coronary anatomy or pathology. The Cardiac PET CT interpretation by the cardiologist is attached.   COMPARISON:  CT [facility] 05/08/2020   FINDINGS: Vascular: No acute abnormality.   Mediastinum/Nodes: No mass or adenopathy identified. Visualized portions of the lower airway and esophagus are unremarkable.   Lungs/Pleura: Cardiac  enlargement is identified which has mass effect upon the lingula and left lower lobe with adjacent passive atelectasis. No signs of pleural effusion or airspace consolidation. No suspicious pulmonary nodule or mass.   Upper Abdomen: There are multiple cysts identified within both lobes of the liver. The largest is in the right lobe and measures at least 9.7 cm, image 83/3. These appear unchanged compared with the previous exam.   Musculoskeletal: No acute or suspicious osseous findings. Thoracic degenerative disc disease noted.   IMPRESSION: 1. No significant noncardiac supplemental findings. 2. Liver cysts.     Electronically Signed   By: TaKerby Moors.D.   On: 12/05/2021 12:05          Risk Assessment/Calculations/Metrics:              Physical Exam:    VS:  BP 131/78 (BP Location: Other (Comment), Patient  Position: Prone)   Pulse 73   Ht '5\' 2"'$  (1.575 m)   Wt 184 lb 6 oz (83.6 kg)   SpO2 95%   BMI 33.72 kg/m     Wt Readings from Last 3 Encounters:  01/09/22 184 lb 6 oz (83.6 kg)  12/19/21 179 lb (81.2 kg)  11/29/21 179 lb 12.8 oz (81.6 kg)    Physical Exam  GEN: Well nourished, well developed, in no acute distress  Neck: no JVD, carotid bruits, or masses Cardiac:RRR; no murmurs, rubs, or gallops  Respiratory:  clear to auscultation bilaterally, normal work of breathing GI: soft, nontender, nondistended, + BS Ext: without cyanosis, clubbing, or edema, Good distal pulses bilaterally Neuro:  Alert and Oriented x 3,  Psych: euthymic mood, full affect       ASSESSMENT & PLAN:   No problem-specific Assessment & Plan notes found for this encounter.   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 Viral from covid 19. Stress PET CTA ordered 8/29. PET stress no ischemia, cMRI ordered by Dr. Radford Pax.Started on losartan.  Patient was orthostatic LOV and fell in BR. BP dropped 118/70 to 80/50 sitting to standing in the office. I Stopped lasix and  losartan.  bmet and cbc stable, compression stockings-on order, hasn't gotten yet increase water intake. BP better but still dizzy and unsteady on her feet. I think some of her symptoms are coming from her psychiatric meds. She is not in heart failure. Will hold off on restarting meds today. F/u in [place] office and will transfer care since Dr. Radford Pax won't be going there any longer.   Orthostatic hypotension as above limiting heart failure treatment.   Mild MR   CKD Left nephrectomy          Dispo:  No follow-ups on file.   Medication Adjustments/Labs and Tests Ordered: Current medicines are reviewed at length with the patient today.  Concerns regarding medicines are outlined above.  Tests Ordered: No orders of the defined types were placed in this encounter.  Medication Changes: No orders of the defined types were placed in this encounter.  Signed, Ermalinda Barrios, PA-C  01/09/2022 11:52 AM    [address], [place], [place]  [address] Phone: [phone]; Fax: [phone]

## 2022-01-08 ENCOUNTER — Ambulatory Visit (HOSPITAL_COMMUNITY)
Admission: RE | Admit: 2022-01-08 | Discharge: 2022-01-08 | Disposition: A | Discharge status: Home / Self Care | Payer: Medicare Other | Source: Ambulatory Visit | Attending: Nurse Practitioner | Admitting: Nurse Practitioner

## 2022-01-08 DIAGNOSIS — E041 Nontoxic single thyroid nodule: Secondary | ICD-10-CM

## 2022-01-09 ENCOUNTER — Ambulatory Visit: Payer: Medicare Other | Attending: Physician Assistant | Admitting: Physician Assistant

## 2022-01-09 ENCOUNTER — Encounter: Payer: Self-pay | Admitting: Physician Assistant

## 2022-01-09 VITALS — BP 131/78 | HR 73 | Ht 62.0 in | Wt 184.4 lb

## 2022-01-09 DIAGNOSIS — I519 Heart disease, unspecified: Secondary | ICD-10-CM

## 2022-01-09 DIAGNOSIS — I340 Nonrheumatic mitral (valve) insufficiency: Secondary | ICD-10-CM

## 2022-01-09 DIAGNOSIS — Z905 Acquired absence of kidney: Secondary | ICD-10-CM

## 2022-01-09 DIAGNOSIS — I951 Orthostatic hypotension: Secondary | ICD-10-CM

## 2022-01-09 NOTE — Patient Instructions (Signed)
Medication Instructions:  Your physician recommends that you continue on your current medications as directed. Please refer to the Current Medication list given to you today.  *If you need a refill on your cardiac medications before your next appointment, please call your pharmacy*   Lab Work: None ordered today  If you have labs (blood work) drawn today and your tests are completely normal, you will receive your results only by: [place] (if you have MyChart) OR A paper copy in the mail If you have any lab test that is abnormal or we need to change your treatment, we will call you to review the results.   Testing/Procedures: None ordered today   Follow-Up: At [facility], you and your health needs are our priority.  As part of our continuing mission to provide you with exceptional heart care, we have created designated Provider Care Teams.  These Care Teams include your primary Cardiologist (physician) and Advanced Practice Providers (APPs -  Physician Assistants and Nurse Practitioners) who all work together to provide you with the care you need, when you need it.  We recommend signing up for the patient portal called "MyChart".  Sign up information is provided on this After Visit Summary.  MyChart is used to connect with patients for Virtual Visits (Telemedicine).  Patients are able to view lab/test results, encounter notes, upcoming appointments, etc.  Non-urgent messages can be sent to your provider as well.   To learn more about what you can do with MyChart, go to [email or web address].    Your next appointment:   2 month(s)  The format for your next appointment:   In Person  Provider:   Estill Bakes, MD  Important Information About Sugar

## 2022-01-14 ENCOUNTER — Inpatient Hospital Stay (HOSPITAL_COMMUNITY)
Admission: EM | Admit: 2022-01-14 | Discharge: 2022-01-23 | DRG: 643 | Disposition: A | Discharge status: Skilled Nursing Facility | Payer: Medicare Other | Source: Skilled Nursing Facility | Attending: Internal Medicine | Admitting: Internal Medicine

## 2022-01-14 DIAGNOSIS — J9601 Acute respiratory failure with hypoxia: Secondary | ICD-10-CM | POA: Diagnosis present

## 2022-01-14 DIAGNOSIS — Z79899 Other long term (current) drug therapy: Secondary | ICD-10-CM

## 2022-01-14 DIAGNOSIS — R410 Disorientation, unspecified: Secondary | ICD-10-CM | POA: Diagnosis not present

## 2022-01-14 DIAGNOSIS — Z8249 Family history of ischemic heart disease and other diseases of the circulatory system: Secondary | ICD-10-CM

## 2022-01-14 DIAGNOSIS — E222 Syndrome of inappropriate secretion of antidiuretic hormone: Secondary | ICD-10-CM | POA: Diagnosis not present

## 2022-01-14 DIAGNOSIS — E871 Hypo-osmolality and hyponatremia: Secondary | ICD-10-CM | POA: Diagnosis present

## 2022-01-14 DIAGNOSIS — N3000 Acute cystitis without hematuria: Secondary | ICD-10-CM | POA: Diagnosis present

## 2022-01-14 DIAGNOSIS — Z9049 Acquired absence of other specified parts of digestive tract: Secondary | ICD-10-CM

## 2022-01-14 DIAGNOSIS — I5020 Unspecified systolic (congestive) heart failure: Secondary | ICD-10-CM | POA: Diagnosis present

## 2022-01-14 DIAGNOSIS — N182 Chronic kidney disease, stage 2 (mild): Secondary | ICD-10-CM | POA: Insufficient documentation

## 2022-01-14 DIAGNOSIS — M1990 Unspecified osteoarthritis, unspecified site: Secondary | ICD-10-CM | POA: Diagnosis present

## 2022-01-14 DIAGNOSIS — F200 Paranoid schizophrenia: Secondary | ICD-10-CM | POA: Diagnosis present

## 2022-01-14 DIAGNOSIS — I5021 Acute systolic (congestive) heart failure: Secondary | ICD-10-CM

## 2022-01-14 DIAGNOSIS — K219 Gastro-esophageal reflux disease without esophagitis: Secondary | ICD-10-CM | POA: Diagnosis present

## 2022-01-14 DIAGNOSIS — I5023 Acute on chronic systolic (congestive) heart failure: Secondary | ICD-10-CM | POA: Diagnosis present

## 2022-01-14 DIAGNOSIS — Z825 Family history of asthma and other chronic lower respiratory diseases: Secondary | ICD-10-CM

## 2022-01-14 DIAGNOSIS — Z888 Allergy status to other drugs, medicaments and biological substances status: Secondary | ICD-10-CM

## 2022-01-14 DIAGNOSIS — I130 Hypertensive heart and chronic kidney disease with heart failure and stage 1 through stage 4 chronic kidney disease, or unspecified chronic kidney disease: Secondary | ICD-10-CM | POA: Diagnosis present

## 2022-01-14 DIAGNOSIS — L89151 Pressure ulcer of sacral region, stage 1: Secondary | ICD-10-CM | POA: Diagnosis present

## 2022-01-14 DIAGNOSIS — Z7951 Long term (current) use of inhaled steroids: Secondary | ICD-10-CM

## 2022-01-14 DIAGNOSIS — J449 Chronic obstructive pulmonary disease, unspecified: Secondary | ICD-10-CM | POA: Diagnosis present

## 2022-01-14 DIAGNOSIS — G4700 Insomnia, unspecified: Secondary | ICD-10-CM | POA: Diagnosis not present

## 2022-01-14 DIAGNOSIS — N1831 Chronic kidney disease, stage 3a: Secondary | ICD-10-CM | POA: Diagnosis present

## 2022-01-14 DIAGNOSIS — R4781 Slurred speech: Secondary | ICD-10-CM | POA: Diagnosis present

## 2022-01-14 DIAGNOSIS — Z96652 Presence of left artificial knee joint: Secondary | ICD-10-CM | POA: Diagnosis present

## 2022-01-14 DIAGNOSIS — Z79891 Long term (current) use of opiate analgesic: Secondary | ICD-10-CM

## 2022-01-14 DIAGNOSIS — R1310 Dysphagia, unspecified: Secondary | ICD-10-CM | POA: Diagnosis present

## 2022-01-14 DIAGNOSIS — D631 Anemia in chronic kidney disease: Secondary | ICD-10-CM | POA: Diagnosis present

## 2022-01-14 DIAGNOSIS — G9341 Metabolic encephalopathy: Secondary | ICD-10-CM | POA: Diagnosis present

## 2022-01-14 DIAGNOSIS — R739 Hyperglycemia, unspecified: Secondary | ICD-10-CM | POA: Diagnosis present

## 2022-01-14 DIAGNOSIS — G2401 Drug induced subacute dyskinesia: Secondary | ICD-10-CM | POA: Diagnosis present

## 2022-01-14 DIAGNOSIS — Z83719 Family history of colon polyps, unspecified: Secondary | ICD-10-CM

## 2022-01-14 DIAGNOSIS — N179 Acute kidney failure, unspecified: Secondary | ICD-10-CM | POA: Diagnosis not present

## 2022-01-14 DIAGNOSIS — Z905 Acquired absence of kidney: Secondary | ICD-10-CM

## 2022-01-14 DIAGNOSIS — F312 Bipolar disorder, current episode manic severe with psychotic features: Secondary | ICD-10-CM | POA: Diagnosis present

## 2022-01-14 DIAGNOSIS — L8990 Pressure ulcer of unspecified site, unspecified stage: Secondary | ICD-10-CM | POA: Insufficient documentation

## 2022-01-15 ENCOUNTER — Other Ambulatory Visit: Payer: Self-pay

## 2022-01-15 ENCOUNTER — Encounter (HOSPITAL_COMMUNITY): Payer: Self-pay

## 2022-01-15 ENCOUNTER — Encounter (HOSPITAL_COMMUNITY): Payer: Self-pay | Admitting: Emergency Medicine

## 2022-01-15 ENCOUNTER — Inpatient Hospital Stay: Payer: Self-pay

## 2022-01-15 ENCOUNTER — Emergency Department (HOSPITAL_COMMUNITY): Payer: Medicare Other

## 2022-01-15 DIAGNOSIS — I5023 Acute on chronic systolic (congestive) heart failure: Secondary | ICD-10-CM | POA: Diagnosis present

## 2022-01-15 DIAGNOSIS — G2401 Drug induced subacute dyskinesia: Secondary | ICD-10-CM | POA: Diagnosis present

## 2022-01-15 DIAGNOSIS — L8990 Pressure ulcer of unspecified site, unspecified stage: Secondary | ICD-10-CM | POA: Insufficient documentation

## 2022-01-15 DIAGNOSIS — J449 Chronic obstructive pulmonary disease, unspecified: Secondary | ICD-10-CM | POA: Diagnosis present

## 2022-01-15 DIAGNOSIS — N182 Chronic kidney disease, stage 2 (mild): Secondary | ICD-10-CM | POA: Diagnosis not present

## 2022-01-15 DIAGNOSIS — N1831 Chronic kidney disease, stage 3a: Secondary | ICD-10-CM | POA: Diagnosis present

## 2022-01-15 DIAGNOSIS — J9601 Acute respiratory failure with hypoxia: Secondary | ICD-10-CM | POA: Diagnosis present

## 2022-01-15 DIAGNOSIS — J810 Acute pulmonary edema: Secondary | ICD-10-CM | POA: Diagnosis not present

## 2022-01-15 DIAGNOSIS — G9340 Encephalopathy, unspecified: Secondary | ICD-10-CM | POA: Diagnosis not present

## 2022-01-15 DIAGNOSIS — Z9049 Acquired absence of other specified parts of digestive tract: Secondary | ICD-10-CM | POA: Diagnosis not present

## 2022-01-15 DIAGNOSIS — Z83719 Family history of colon polyps, unspecified: Secondary | ICD-10-CM | POA: Diagnosis not present

## 2022-01-15 DIAGNOSIS — N3000 Acute cystitis without hematuria: Secondary | ICD-10-CM | POA: Diagnosis present

## 2022-01-15 DIAGNOSIS — N179 Acute kidney failure, unspecified: Secondary | ICD-10-CM | POA: Diagnosis not present

## 2022-01-15 DIAGNOSIS — K219 Gastro-esophageal reflux disease without esophagitis: Secondary | ICD-10-CM | POA: Diagnosis present

## 2022-01-15 DIAGNOSIS — L89151 Pressure ulcer of sacral region, stage 1: Secondary | ICD-10-CM | POA: Diagnosis present

## 2022-01-15 DIAGNOSIS — R1310 Dysphagia, unspecified: Secondary | ICD-10-CM | POA: Diagnosis present

## 2022-01-15 DIAGNOSIS — G4700 Insomnia, unspecified: Secondary | ICD-10-CM | POA: Diagnosis not present

## 2022-01-15 DIAGNOSIS — R410 Disorientation, unspecified: Secondary | ICD-10-CM | POA: Diagnosis present

## 2022-01-15 DIAGNOSIS — F200 Paranoid schizophrenia: Secondary | ICD-10-CM | POA: Diagnosis present

## 2022-01-15 DIAGNOSIS — D631 Anemia in chronic kidney disease: Secondary | ICD-10-CM | POA: Diagnosis present

## 2022-01-15 DIAGNOSIS — E871 Hypo-osmolality and hyponatremia: Secondary | ICD-10-CM | POA: Diagnosis not present

## 2022-01-15 DIAGNOSIS — M1990 Unspecified osteoarthritis, unspecified site: Secondary | ICD-10-CM | POA: Diagnosis present

## 2022-01-15 DIAGNOSIS — F312 Bipolar disorder, current episode manic severe with psychotic features: Secondary | ICD-10-CM | POA: Diagnosis present

## 2022-01-15 DIAGNOSIS — R739 Hyperglycemia, unspecified: Secondary | ICD-10-CM | POA: Diagnosis present

## 2022-01-15 DIAGNOSIS — Z905 Acquired absence of kidney: Secondary | ICD-10-CM | POA: Diagnosis not present

## 2022-01-15 DIAGNOSIS — I130 Hypertensive heart and chronic kidney disease with heart failure and stage 1 through stage 4 chronic kidney disease, or unspecified chronic kidney disease: Secondary | ICD-10-CM | POA: Diagnosis present

## 2022-01-15 DIAGNOSIS — R4781 Slurred speech: Secondary | ICD-10-CM | POA: Diagnosis present

## 2022-01-15 DIAGNOSIS — I5020 Unspecified systolic (congestive) heart failure: Secondary | ICD-10-CM | POA: Diagnosis not present

## 2022-01-15 DIAGNOSIS — E222 Syndrome of inappropriate secretion of antidiuretic hormone: Secondary | ICD-10-CM | POA: Diagnosis present

## 2022-01-15 DIAGNOSIS — G9341 Metabolic encephalopathy: Secondary | ICD-10-CM | POA: Diagnosis present

## 2022-01-15 LAB — URINALYSIS, ROUTINE W REFLEX MICROSCOPIC
Bacteria, UA: NONE SEEN
Bilirubin Urine: NEGATIVE
Bilirubin Urine: NEGATIVE
Glucose, UA: NEGATIVE mg/dL
Glucose, UA: NEGATIVE mg/dL
Hgb urine dipstick: NEGATIVE
Hgb urine dipstick: NEGATIVE
Ketones, ur: 20 mg/dL — AB
Ketones, ur: 5 mg/dL — AB
Leukocytes,Ua: NEGATIVE
Nitrite: NEGATIVE
Nitrite: NEGATIVE
Protein, ur: 100 mg/dL — AB
Protein, ur: 100 mg/dL — AB
Specific Gravity, Urine: 1.023 (ref 1.005–1.030)
Specific Gravity, Urine: 1.02 (ref 1.005–1.030)
WBC, UA: >50 WBC/hpf — ABNORMAL HIGH (ref 0–5)
pH: 5 (ref 5.0–8.0)
pH: 5 (ref 5.0–8.0)

## 2022-01-15 LAB — CBC WITH DIFFERENTIAL/PLATELET
Abs Immature Granulocytes: 0.03 10*3/uL (ref 0.00–0.07)
Basophils Absolute: 0 10*3/uL (ref 0.0–0.1)
Basophils Relative: 0 %
Eosinophils Absolute: 0 10*3/uL (ref 0.0–0.5)
Eosinophils Relative: 0 %
HCT: 35.4 % — ABNORMAL LOW (ref 36.0–46.0)
Hemoglobin: 12.6 g/dL (ref 12.0–15.0)
Immature Granulocytes: 0 %
Lymphocytes Relative: 6 %
Lymphs Abs: 0.5 10*3/uL — ABNORMAL LOW (ref 0.7–4.0)
MCH: 30.8 pg (ref 26.0–34.0)
MCHC: 35.6 g/dL (ref 30.0–36.0)
MCV: 86.6 fL (ref 80.0–100.0)
Monocytes Absolute: 0.8 10*3/uL (ref 0.1–1.0)
Monocytes Relative: 11 %
Neutro Abs: 6.3 10*3/uL (ref 1.7–7.7)
Neutrophils Relative %: 83 %
Platelets: 214 10*3/uL (ref 150–400)
RBC: 4.09 MIL/uL (ref 3.87–5.11)
RDW: 11.9 % (ref 11.5–15.5)
WBC: 7.6 10*3/uL (ref 4.0–10.5)
nRBC: 0 % (ref 0.0–0.2)

## 2022-01-15 LAB — COMPREHENSIVE METABOLIC PANEL
ALT: 23 U/L (ref 0–44)
ALT: 25 U/L (ref 0–44)
ALT: 24 U/L (ref 0–44)
AST: 22 U/L (ref 15–41)
AST: 24 U/L (ref 15–41)
AST: 25 U/L (ref 15–41)
Albumin: 3.4 g/dL — ABNORMAL LOW (ref 3.5–5.0)
Albumin: 3.7 g/dL (ref 3.5–5.0)
Albumin: 3.4 g/dL — ABNORMAL LOW (ref 3.5–5.0)
Alkaline Phosphatase: 99 U/L (ref 38–126)
Alkaline Phosphatase: 105 U/L (ref 38–126)
Alkaline Phosphatase: 95 U/L (ref 38–126)
Anion gap: 9 (ref 5–15)
Anion gap: 10 (ref 5–15)
Anion gap: 10 (ref 5–15)
BUN: 10 mg/dL (ref 8–23)
BUN: 10 mg/dL (ref 8–23)
BUN: 8 mg/dL (ref 8–23)
CO2: 22 mmol/L (ref 22–32)
CO2: 24 mmol/L (ref 22–32)
CO2: 24 mmol/L (ref 22–32)
Calcium: 8.1 mg/dL — ABNORMAL LOW (ref 8.9–10.3)
Calcium: 8.2 mg/dL — ABNORMAL LOW (ref 8.9–10.3)
Calcium: 8.5 mg/dL — ABNORMAL LOW (ref 8.9–10.3)
Chloride: 79 mmol/L — ABNORMAL LOW (ref 98–111)
Chloride: 77 mmol/L — ABNORMAL LOW (ref 98–111)
Chloride: 78 mmol/L — ABNORMAL LOW (ref 98–111)
Creatinine, Ser: 0.62 mg/dL (ref 0.44–1.00)
Creatinine, Ser: 0.65 mg/dL (ref 0.44–1.00)
Creatinine, Ser: 0.7 mg/dL (ref 0.44–1.00)
GFR, Estimated: >60 mL/min (ref >60)
GFR, Estimated: >60 mL/min (ref >60)
GFR, Estimated: >60 mL/min (ref >60)
Glucose, Bld: 118 mg/dL — ABNORMAL HIGH (ref 70–99)
Glucose, Bld: 118 mg/dL — ABNORMAL HIGH (ref 70–99)
Glucose, Bld: 107 mg/dL — ABNORMAL HIGH (ref 70–99)
Potassium: 4.7 mmol/L (ref 3.5–5.1)
Potassium: 4.5 mmol/L (ref 3.5–5.1)
Potassium: 4.7 mmol/L (ref 3.5–5.1)
Sodium: 110 mmol/L — CL (ref 135–145)
Sodium: 111 mmol/L — CL (ref 135–145)
Sodium: 112 mmol/L — CL (ref 135–145)
Total Bilirubin: 1 mg/dL (ref 0.3–1.2)
Total Bilirubin: 1 mg/dL (ref 0.3–1.2)
Total Bilirubin: 0.9 mg/dL (ref 0.3–1.2)
Total Protein: 5.6 g/dL — ABNORMAL LOW (ref 6.5–8.1)
Total Protein: 6 g/dL — ABNORMAL LOW (ref 6.5–8.1)
Total Protein: 5.6 g/dL — ABNORMAL LOW (ref 6.5–8.1)

## 2022-01-15 LAB — SODIUM
Sodium: 113 mmol/L — CL (ref 135–145)
Sodium: 113 mmol/L — CL (ref 135–145)
Sodium: 114 mmol/L — CL (ref 135–145)

## 2022-01-15 LAB — BASIC METABOLIC PANEL
Anion gap: 11 (ref 5–15)
Anion gap: 13 (ref 5–15)
BUN: 8 mg/dL (ref 8–23)
BUN: 9 mg/dL (ref 8–23)
CO2: 25 mmol/L (ref 22–32)
CO2: 24 mmol/L (ref 22–32)
Calcium: 8.6 mg/dL — ABNORMAL LOW (ref 8.9–10.3)
Calcium: 8.5 mg/dL — ABNORMAL LOW (ref 8.9–10.3)
Chloride: 76 mmol/L — ABNORMAL LOW (ref 98–111)
Chloride: 76 mmol/L — ABNORMAL LOW (ref 98–111)
Creatinine, Ser: 0.63 mg/dL (ref 0.44–1.00)
Creatinine, Ser: 0.71 mg/dL (ref 0.44–1.00)
GFR, Estimated: >60 mL/min (ref >60)
GFR, Estimated: >60 mL/min (ref >60)
Glucose, Bld: 103 mg/dL — ABNORMAL HIGH (ref 70–99)
Glucose, Bld: 98 mg/dL (ref 70–99)
Potassium: 4.4 mmol/L (ref 3.5–5.1)
Potassium: 4.4 mmol/L (ref 3.5–5.1)
Sodium: 112 mmol/L — CL (ref 135–145)
Sodium: 113 mmol/L — CL (ref 135–145)

## 2022-01-15 LAB — BRAIN NATRIURETIC PEPTIDE
B Natriuretic Peptide: 1173 pg/mL — ABNORMAL HIGH (ref 0.0–100.0)
B Natriuretic Peptide: 1425.3 pg/mL — ABNORMAL HIGH (ref 0.0–100.0)

## 2022-01-15 LAB — PROCALCITONIN: Procalcitonin: <0.1 ng/mL

## 2022-01-15 LAB — BLOOD GAS, ARTERIAL
Acid-Base Excess: 0 mmol/L (ref 0.0–2.0)
Bicarbonate: 25.4 mmol/L (ref 20.0–28.0)
Drawn by: 10555
FIO2: 36 %
O2 Saturation: 97.7 %
Patient temperature: 37
pCO2 arterial: 43 mmHg (ref 32–48)
pH, Arterial: 7.38 (ref 7.35–7.45)
pO2, Arterial: 78 mmHg — ABNORMAL LOW (ref 83–108)

## 2022-01-15 LAB — OSMOLALITY, URINE: Osmolality, Ur: 605 mOsm/kg (ref 300–900)

## 2022-01-15 LAB — MRSA NEXT GEN BY PCR, NASAL: MRSA by PCR Next Gen: NOT DETECTED

## 2022-01-15 LAB — LIPASE, BLOOD: Lipase: 27 U/L (ref 11–51)

## 2022-01-15 LAB — LACTIC ACID, PLASMA: Lactic Acid, Venous: 0.8 mmol/L (ref 0.5–1.9)

## 2022-01-15 LAB — CORTISOL: Cortisol, Plasma: 23.2 ug/dL

## 2022-01-15 LAB — OSMOLALITY: Osmolality: 228 mOsm/kg — CL (ref 275–295)

## 2022-01-15 LAB — CBG MONITORING, ED: Glucose-Capillary: 123 mg/dL — ABNORMAL HIGH (ref 70–99)

## 2022-01-15 LAB — SODIUM, URINE, RANDOM: Sodium, Ur: 37 mmol/L

## 2022-01-15 LAB — TSH: TSH: 1.121 u[IU]/mL (ref 0.350–4.500)

## 2022-01-15 LAB — CREATININE, URINE, RANDOM: Creatinine, Urine: 117 mg/dL

## 2022-01-15 LAB — AMMONIA: Ammonia: 17 umol/L (ref 9–35)

## 2022-01-15 MED ORDER — SODIUM CHLORIDE 3 % IV BOLUS
100.0000 mL | Freq: Once | INTRAVENOUS | Status: AC
  Administered 2022-01-15: 100 mL via INTRAVENOUS
  Filled 2022-01-15: qty 500

## 2022-01-15 MED ORDER — FUROSEMIDE 10 MG/ML IJ SOLN
40.0000 mg | Freq: Once | INTRAMUSCULAR | Status: AC
  Administered 2022-01-15: 40 mg via INTRAVENOUS
  Filled 2022-01-15: qty 4

## 2022-01-15 MED ORDER — SODIUM CHLORIDE 0.9% FLUSH
10.0000 mL | Freq: Two times a day (BID) | INTRAVENOUS | Status: DC
  Administered 2022-01-15 – 2022-01-18 (×7): 10 mL

## 2022-01-15 MED ORDER — AMANTADINE HCL 100 MG PO CAPS
100.0000 mg | ORAL_CAPSULE | Freq: Two times a day (BID) | ORAL | Status: DC
  Administered 2022-01-15 – 2022-01-23 (×17): 100 mg via ORAL
  Filled 2022-01-15 (×19): qty 1

## 2022-01-15 MED ORDER — ALBUTEROL SULFATE (2.5 MG/3ML) 0.083% IN NEBU: 2.5000 mg | INHALATION_SOLUTION | RESPIRATORY_TRACT | Status: DC | PRN

## 2022-01-15 MED ORDER — SODIUM CHLORIDE 0.9 % IV SOLN
1.0000 g | Freq: Once | INTRAVENOUS | Status: AC
  Administered 2022-01-15: 1 g via INTRAVENOUS
  Filled 2022-01-15: qty 10

## 2022-01-15 MED ORDER — IPRATROPIUM-ALBUTEROL 0.5-2.5 (3) MG/3ML IN SOLN
3.0000 mL | Freq: Once | RESPIRATORY_TRACT | Status: AC
  Administered 2022-01-15: 3 mL via RESPIRATORY_TRACT
  Filled 2022-01-15: qty 3

## 2022-01-15 MED ORDER — ESCITALOPRAM OXALATE 10 MG PO TABS
10.0000 mg | ORAL_TABLET | Freq: Every day | ORAL | Status: DC
  Administered 2022-01-15: 10 mg via ORAL
  Filled 2022-01-15: qty 1

## 2022-01-15 MED ORDER — BUDESONIDE 0.25 MG/2ML IN SUSP
0.2500 mg | Freq: Two times a day (BID) | RESPIRATORY_TRACT | Status: DC
  Administered 2022-01-15 – 2022-01-18 (×7): 0.25 mg via RESPIRATORY_TRACT
  Filled 2022-01-15 (×7): qty 2

## 2022-01-15 MED ORDER — DOCUSATE SODIUM 100 MG PO CAPS: 100.0000 mg | ORAL_CAPSULE | Freq: Two times a day (BID) | ORAL | Status: DC | PRN

## 2022-01-15 MED ORDER — FLUTICASONE PROPIONATE 50 MCG/ACT NA SUSP
2.0000 | Freq: Every day | NASAL | Status: DC
  Administered 2022-01-15 – 2022-01-23 (×8): 2 via NASAL
  Filled 2022-01-15 (×2): qty 16

## 2022-01-15 MED ORDER — SODIUM CHLORIDE 0.9% FLUSH: 10.0000 mL | INTRAVENOUS | Status: DC | PRN

## 2022-01-15 MED ORDER — FLUTICASONE FUROATE-VILANTEROL 200-25 MCG/ACT IN AEPB
1.0000 | INHALATION_SPRAY | Freq: Every day | RESPIRATORY_TRACT | Status: DC
  Filled 2022-01-15: qty 28

## 2022-01-15 MED ORDER — POLYETHYLENE GLYCOL 3350 17 G PO PACK: 17.0000 g | PACK | Freq: Every day | ORAL | Status: DC | PRN

## 2022-01-15 MED ORDER — GABAPENTIN 100 MG PO CAPS
100.0000 mg | ORAL_CAPSULE | Freq: Every day | ORAL | Status: DC
  Administered 2022-01-15 – 2022-01-22 (×8): 100 mg via ORAL
  Filled 2022-01-15 (×8): qty 1

## 2022-01-15 MED ORDER — SODIUM CHLORIDE 3 % IV SOLN
100.0000 mL | Freq: Once | INTRAVENOUS | Status: AC
  Administered 2022-01-15: 100 mL via INTRAVENOUS
  Filled 2022-01-15: qty 500

## 2022-01-15 MED ORDER — REVEFENACIN 175 MCG/3ML IN SOLN
175.0000 ug | Freq: Every day | RESPIRATORY_TRACT | Status: DC
  Administered 2022-01-15 – 2022-01-18 (×4): 175 ug via RESPIRATORY_TRACT
  Filled 2022-01-15 (×4): qty 3

## 2022-01-15 MED ORDER — HEPARIN SODIUM (PORCINE) 5000 UNIT/ML IJ SOLN
5000.0000 [IU] | Freq: Three times a day (TID) | INTRAMUSCULAR | Status: DC
  Administered 2022-01-15 – 2022-01-23 (×23): 5000 [IU] via SUBCUTANEOUS
  Filled 2022-01-15 (×24): qty 1

## 2022-01-15 MED ORDER — CHLORHEXIDINE GLUCONATE CLOTH 2 % EX PADS
6.0000 | MEDICATED_PAD | Freq: Every day | CUTANEOUS | Status: DC
  Administered 2022-01-15 – 2022-01-23 (×9): 6 via TOPICAL

## 2022-01-15 MED ORDER — SODIUM CHLORIDE 0.9 % IV SOLN: INTRAVENOUS | Status: DC | PRN

## 2022-01-15 MED ORDER — ARFORMOTEROL TARTRATE 15 MCG/2ML IN NEBU
15.0000 ug | INHALATION_SOLUTION | Freq: Two times a day (BID) | RESPIRATORY_TRACT | Status: DC
  Administered 2022-01-15 – 2022-01-18 (×7): 15 ug via RESPIRATORY_TRACT
  Filled 2022-01-15 (×7): qty 2

## 2022-01-15 NOTE — Progress Notes (Signed)
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  Patient stated that she has been feeling weak since last 2 weeks, but was unable to provide further history, she only said that the administration sent her to the ED, but she did not know why.  Most of the history was obtained from ED physician and ED medical record.  Per report, patient was level 5, caveat on arrival due to altered mental status. Patient was usually awake and alert at baseline and does not use oxygen at baseline, but nursing facility reported that patient was " talking out of her head" all day and was also complaining of back pain which was an acute change in mental status.  So EMS was activated and patient was taken to the ED for further evaluation and management.  In the emergency department, she was tachypneic, BP was 140/75 and patient was requiring supplemental oxygen via and is at 3 LPM with O2 sat at 97% on room air (patient does not use oxygen at baseline).  Work-up in the ED showed normocytic anemia, BMP showed sodium of 110 > 111, potassium 4.7, chloride 79, bicarb 22, glucose 118, BUN 10, creatinine 0.62, albumin 3.4, ammonia 17, lactic acid 0.8.  Blood culture pending. CT head without contrast showed no acute intracranial CT findings or interval changes Chest x-ray showed mild CHF pattern with central and basilar interstitial edema and small pleural effusions. Opacities in the lung bases which could be atelectasis, pneumonia or airspace edema.  Physical exam:  BP (!) 124/99   Pulse 95   Temp 97.8 F (36.6 C) (Rectal)   Resp 20   Ht '5\' 2"'$  (1.575 m)   Wt 83.5 kg   SpO2 94%   BMI 33.65 kg/m   General: Elderly female.  Ill-appearing, but not in any acute distress.  Awake and alert and oriented x3.  HEENT: NCAT.  PERRLA. EOMI. Sclerae  anicteric.  Dry mucosal membranes. Neck: Neck supple without lymphadenopathy. No carotid bruits. No masses palpated.  Cardiovascular: Regular rate with normal S1-S2 sounds. No murmurs, rubs or gallops auscultated. No JVD.  Respiratory: Coarse breath sounds.  No accessory muscle use. Abdomen: Soft, nontender, nondistended. Active bowel sounds. No masses or hepatosplenomegaly  Skin: No rashes, lesions, or ulcerations.  Dry, warm to touch. Musculoskeletal:  2+ dorsalis pedis and radial pulses. Good ROM.  No contractures  Psychiatric: Mood appropriate to current condition. Neurologic: No focal neurological deficits.   Assessment and plan Acute respiratory failure with hypoxia possibly due to acute on chronic CHF Hyponatremia Acute cystitis without hematuria  Due to the patient's symptomatic hyponatremia with level of sodium of 110 > 111, coupled with possible acute on chronic CHF with an EF of 20% (12/05/2021).  It was recommended for the ED physician to consult with PCCM for admission.  I spoke with Dr. Ruthann Cancer who will accept the patient to ICU at [facility].  This was discussed with the ED physician (Dr. Christy Gentles) who will admit the patient to ICU since patient is still in the ED.

## 2022-01-15 NOTE — ED Provider Notes (Signed)
[facility] EMERGENCY DEPARTMENT Provider Note   CSN: [id number] Arrival date & time: 01/14/22  2356     History  Chief Complaint  Patient presents with   Urinary Tract Infection   Level 5 caveat due to altered mental status [name] is a 81 y.o. female.  The history is provided by the patient. The history is limited by the condition of the patient.   Patient with history of schizophrenia, asthma presents with altered mental status.  Patient presents from [facility] facility.  It is  reported that she has been altered all day.  No other details are known on arrival Past Medical History:  Diagnosis Date   Anemia 1990   hx of   Anxiety    Arthritis 2004   Asthma 1995   uses inhaler   Chronic kidney disease 2010   LEFT kidney removed   Colon polyp    Complication of anesthesia 1966   first surgery she had- had a hard time waking up from surgery   Delusional disorder ([place])    Six delusional disorder   Diverticulosis 2003   GERD (gastroesophageal reflux disease)    on meds   Schizophrenia ([place])     Home Medications Prior to Admission medications   Medication Sig Start Date End Date Taking? Authorizing Provider  acetaminophen (TYLENOL) 325 MG tablet Take 650 mg by mouth every 6 (six) hours as needed.    [provider]  amantadine (SYMMETREL) 100 MG capsule Take 100 mg by mouth 2 (two) times daily. 05/03/21   [provider]  antiseptic oral rinse (BIOTENE) LIQD 15 mLs by Mouth Rinse route as needed for dry mouth.    [provider]  BREO ELLIPTA 200-25 MCG/INH AEPB Inhale 1 puff into the lungs daily. 06/22/20   [provider]  cetirizine (ZYRTEC) 10 MG tablet Take 10 mg by mouth daily.    [provider]  cyanocobalamin 1000 MCG tablet Take 1,000 mcg by mouth daily.    [provider]  escitalopram (LEXAPRO) 10 MG tablet Take 10 mg by mouth daily. 05/03/21   [provider]  fluticasone (FLONASE) 50  MCG/ACT nasal spray Place 2 sprays into both nostrils daily. 05/02/21   [provider]  gabapentin (NEURONTIN) 100 MG capsule Take 100 mg by mouth at bedtime.    [provider]  melatonin 3 MG TABS tablet Take 3 mg by mouth at bedtime.    [provider]  Multiple Vitamins-Minerals (PRESERVISION AREDS) TABS Take 1 tablet by mouth in the morning and at bedtime.    [provider]  omeprazole (PRILOSEC) 20 MG capsule Take 1 capsule (20 mg total) by mouth daily. 06/19/19   Palumbo, April, MD  risperiDONE (RISPERDAL) 1 MG tablet Take 1 tablet (1 mg total) by mouth at bedtime. 10/27/21   Johnson, Clanford L, MD  traZODone (DESYREL) 150 MG tablet Take 150 mg by mouth at bedtime. 05/03/21   [provider]      Allergies    Haloperidol, Aripiprazole, and Chlorpheniramine-pse-ibuprofen    Review of Systems   Review of Systems  Unable to perform ROS: Mental status change    Physical Exam Updated Vital Signs BP (!) 138/58   Pulse 88   Temp 97.8 F (36.6 C) (Rectal)   Resp (!) 26   Ht 1.575 m ('5\' 2"'$ )   Wt 83.5 kg   SpO2 95%   BMI 33.65 kg/m  Physical Exam CONSTITUTIONAL: Elderly, somnolent, disheveled HEAD:  Normocephalic/atraumatic EYES: Eyes closed, but she opens them to command.  Pupils are equal and reactive.  No nystagmus ENMT: Mucous membranes dry NECK: supple no meningeal signs CV: S1/S2 noted, no murmurs/rubs/gallops noted LUNGS: Coarse breath sounds bilaterally.  Patient is on 3 L nasal cannula ABDOMEN: soft, nontender NEURO: Pt is somnolent but arousable to voice.  She moves all extremities x4.  She is unable provide any history EXTREMITIES: pulses normal/equal, full ROM, all extremities/joints palpated/ranged and nontender SKIN: warm, color normal PSYCH: Unable to assess ED Results / Procedures / Treatments   Labs (all labs ordered are listed, but only abnormal results are displayed) Labs Reviewed  URINALYSIS, ROUTINE W REFLEX  MICROSCOPIC - Abnormal; Notable for the following components:      Result Value   APPearance CLOUDY (*)    Ketones, ur 20 (*)    Protein, ur 100 (*)    Leukocytes,Ua LARGE (*)    WBC, UA >50 (*)    Bacteria, UA FEW (*)    Non Squamous Epithelial 0-5 (*)    All other components within normal limits  CBC WITH DIFFERENTIAL/PLATELET - Abnormal; Notable for the following components:   HCT 35.4 (*)    Lymphs Abs 0.5 (*)    All other components within normal limits  BLOOD GAS, ARTERIAL - Abnormal; Notable for the following components:   pO2, Arterial 78 (*)    All other components within normal limits  BRAIN NATRIURETIC PEPTIDE - Abnormal; Notable for the following components:   B Natriuretic Peptide 1,173.0 (*)    All other components within normal limits  COMPREHENSIVE METABOLIC PANEL - Abnormal; Notable for the following components:   Sodium 110 (*)    Chloride 79 (*)    Glucose, Bld 118 (*)    Calcium 8.1 (*)    Total Protein 5.6 (*)    Albumin 3.4 (*)    All other components within normal limits  CBG MONITORING, ED - Abnormal; Notable for the following components:   Glucose-Capillary 123 (*)    All other components within normal limits  CULTURE, BLOOD (ROUTINE X 2)  CULTURE, BLOOD (ROUTINE X 2)  URINE CULTURE  LACTIC ACID, PLASMA  AMMONIA  I-STAT CHEM 8, ED    EKG EKG Interpretation  Date/Time:  Monday January 15 2022 04:46:50 EDT Ventricular Rate:  82 PR Interval:  192 QRS Duration: 157 QT Interval:  441 QTC Calculation: 516 R Axis:   79 Text Interpretation: Sinus rhythm Left atrial enlargement Left ventricular hypertrophy Anterior infarct, old Prolonged QT interval Confirmed by Ripley Fraise [phone]) on 01/15/2022 4:51:32 AM  Radiology CT Head Wo Contrast  Result Date: 01/15/2022 CLINICAL DATA:  Delirium with possible urinary tract infection. EXAM: CT HEAD WITHOUT CONTRAST TECHNIQUE: Contiguous axial images were obtained from the base of the skull through the  vertex without intravenous contrast. RADIATION DOSE REDUCTION: This exam was performed according to the departmental dose-optimization program which includes automated exposure control, adjustment of the mA and/or kV according to patient size and/or use of iterative reconstruction technique. COMPARISON:  Head CT 10/24/2021 FINDINGS: Brain: There is mild atrophy with extensive chronic small-vessel disease of the cerebral white matter, and several small scattered bilateral gangliocapsular chronic lacunar infarcts. No new asymmetry is seen worrisome for a cortical based acute infarct, hemorrhage or midline shift. No mass or mass effect are identified. There is a mildly expanded empty sella. Ventricles are normal in size and position. Relatively unremarkable cerebellum, brainstem. Vascular: There are scattered calcifications in the siphons.  There are no hyperdense central vessels. Skull: Intact with hyperostosis frontalis interna. Sinuses/Orbits: There is mild membrane disease in the sinuses without fluid levels or mastoid effusion. Old lens replacements. Other: None. IMPRESSION: No acute intracranial CT findings or interval changes. Atrophy with extensive chronic small vessel disease and old central lacunae. Expanded empty sella, which may be seen with idiopathic intracranial hypertension. This was seen previously. By report was present as far back as MRI brain 07/15/2000. Carotid atherosclerosis. Electronically Signed   By: Telford Nab M.D.   On: 01/15/2022 01:25   DG Chest Port 1 View  Result Date: 01/15/2022 CLINICAL DATA:  Increased confusion. EXAM: PORTABLE CHEST 1 VIEW COMPARISON:  Chest 10/24/2021. FINDINGS: The heart is enlarged. There is increased central vascular prominence, flow cephalization and mild interstitial edema in the central and basal lungs with small pleural effusions. There are increased patchy opacities in the lung bases. The lung opacities could be atelectasis, pneumonia or edema. No  other focal infiltrate is seen. The mediastinum is normally outlined. There is aortic atherosclerosis. Thoracic spondylosis. IMPRESSION: Mild CHF pattern with central and basilar interstitial edema and small pleural effusions. Opacities in the lung bases which could be atelectasis, pneumonia or airspace edema. Progress chest films recommended depending on clinical response. Electronically Signed   By: Telford Nab M.D.   On: 01/15/2022 00:51    Procedures .Critical Care  Performed by: Ripley Fraise, MD Authorized by: Ripley Fraise, MD   Critical care provider statement:    Critical care time (minutes):  80   Critical care start time:  01/15/2022 12:10 AM   Critical care end time:  01/15/2022 1:30 AM   Critical care time was exclusive of:  Separately billable procedures and treating other patients   Critical care was necessary to treat or prevent imminent or life-threatening deterioration of the following conditions:  Respiratory failure, sepsis, shock and metabolic crisis   Critical care was time spent personally by me on the following activities:  Obtaining history from patient or surrogate, examination of patient, evaluation of patient's response to treatment, development of treatment plan with patient or surrogate, re-evaluation of patient's condition, ordering and review of radiographic studies, pulse oximetry, ordering and review of laboratory studies, ordering and performing treatments and interventions and review of old charts   I assumed direction of critical care for this patient from another provider in my specialty: no     Care discussed with: admitting provider       Medications Ordered in ED Medications  cefTRIAXone (ROCEPHIN) 1 g in sodium chloride 0.9 % 100 mL IVPB (1 g Intravenous New Bag/Given 01/15/22 0238)  ipratropium-albuterol (DUONEB) 0.5-2.5 (3) MG/3ML nebulizer solution 3 mL (3 mLs Nebulization Given 01/15/22 0131)    ED Course/ Medical Decision Making/  A&P Clinical Course as of 01/15/22 0434  Mon Jan 15, 2022  0034 Discussed with nursing staff at her facility.  Patient is usually awake and alert.  She is not typically on oxygen.  Staff reported that she was "talking out of her head "all day.  She was also reporting back pain.  This represents an acute mental status change. [DW]  0128 Patient now more alert, but she is still significantly short of breath.  She also has wheeze and crackles.  We will give DuoNeb [DW]  0128 Glucose-Capillary(!): 123 Mild hyperglycemia [DW]  0137 Recent cardiology records reveal ejection fraction 25% [DW]  0222 UTI noted, will start Rocephin.  Lactic acid negative.  Patient does not appear septic.  We will also need diuresis for CHF, will admit [DW]  0250 Sodium(!!): 110 Hyponatremia [DW]  0308 D/w dr Josephine Cables for admission.  He requests critical care consult due to significant hyponatremia with AMS [DW]  0330 Patient remains awake alert and conversant.  Awaiting callback from critical care [DW]  [phone] Discussed with Dr. Olevia Bowens [facility] critical care.  She would like to speak to the hospitalist [DW]  [phone] After discussion between Dr. Josephine Cables and Dr. Ruthann Cancer, patient will be admitted to [facility] ICU [DW]  [phone] Managing her CHF/edema is challenging due to her underlying hyponatremia.  Will defer any diuresis for now.  Patient is stable on her oxygen. [DW]    Clinical Course User Index [DW] Ripley Fraise, MD                           Medical Decision Making Amount and/or Complexity of Data Reviewed Labs: ordered. Decision-making details documented in ED Course. Radiology: ordered. ECG/medicine tests: ordered.  Risk Prescription drug management. Decision regarding hospitalization.   This patient presents to the ED for concern of altered mental status, this involves an extensive number of treatment options, and is a complaint that carries with it a high risk of complications and morbidity.   The differential diagnosis includes but is not limited to CVA, intracranial hemorrhage, acute coronary syndrome, renal failure, urinary tract infection, electrolyte disturbance, pneumonia    Comorbidities that complicate the patient evaluation: Patient's presentation is complicated by their history of schizophrenia  Social Determinants of Health: Patient's  residence in a nursing facility   increases the complexity of managing their presentation  Additional history obtained: Additional history obtained from nursing home/care facility Records reviewed previous admission documents  Lab Tests: I Ordered, and personally interpreted labs.  The pertinent results include: Urinary tract infection, significant hyponatremia  Imaging Studies ordered: I ordered imaging studies including CT scan head and X-ray chest   I independently visualized and interpreted imaging which showed CT head was reviewed and negative, x-ray reveals pulmonary edema I agree with the radiologist interpretation  Cardiac Monitoring: The patient was maintained on a cardiac monitor.  I personally viewed and interpreted the cardiac monitor which showed an underlying rhythm of:  sinus rhythm  Medicines ordered and prescription drug management: I ordered medication including Rocephin for UTI Reevaluation of the patient after these medicines showed that the patient    stayed the same  Critical Interventions:  Admission for management of electrolyte disturbance, IV antibiotics for Rocephin  Consultations Obtained: I requested consultation with the admitting physician Dr. Josephine Cables and Dr. Ruthann Cancer , and discussed  findings as well as pertinent plan - they recommend: Dr. Ruthann Cancer has agreed to admit to [facility]  Reevaluation: After the interventions noted above, I reevaluated the patient and found that they have :improved  Complexity of problems addressed: Patient's presentation is most consistent with  acute presentation  with potential threat to life or bodily function  Disposition: After consideration of the diagnostic results and the patient's response to treatment,  I feel that the patent would benefit from admission   .           Final Clinical Impression(s) / ED Diagnoses Final diagnoses:  Delirium  Acute cystitis without hematuria  Acute respiratory failure with hypoxia (HCC)  Acute systolic congestive heart failure ([place])  Hyponatremia    Rx / DC Orders ED Discharge Orders     None  Ripley Fraise, MD 01/15/22 [phone]

## 2022-01-15 NOTE — ED Triage Notes (Signed)
Pt BIB [facility] for possible UTI, facility reports AMS, however, pt is a&o x 4 during triage; pt reports recent UTI; denies pain

## 2022-01-15 NOTE — H&P (Signed)
NAME:  [name], MRN:  [id number], DOB:  1941/01/25, LOS: 0 ADMISSION DATE:  01/14/2022, CONSULTATION DATE:  10/9 REFERRING MD:  Christy Gentles, CHIEF COMPLAINT:  hyponatremia    History of Present Illness:  81 year old female patient who presented to [facility] from assisted care.  Presented with altered mental status, following about 2 weeks of progressive weakness.  In the emergency department was found to be short of breath, requiring supplemental oxygen, sodium was 111, BUN 10, creatinine 0.62 CT of head was negative chest x-ray demonstrating mild pulmonary edema.  Because of her symptomatic hyponatremia she was transferred to [facility] for treatment  Pertinent  Medical History  Systolic heart failure with a EF 20 to 25%, CKD stage II, GERD, hypertension, anxiety, diverticulosis. Resides at assisted living typically ambulatory  Significant Hospital Events: Including procedures, antibiotic start and stop dates in addition to other pertinent events   10/9 presented from assisted living with altered mental status, and approximately 2 weeks of progressive weakness.  Working diagnosis decompensated heart failure with symptomatic hyponatremia, urine studies sent, administered 3% sodium on arrival due to symptomatic hyponatremia  Interim History / Subjective:  Awake, dyspneic.  Has some trouble with recall.  Speech is slurred  Objective   Blood pressure 129/89, pulse 87, temperature 98 F (36.7 C), temperature source Oral, resp. rate (Abnormal) 21, height _0  (1.575 m), weight 83.5 kg, SpO2 96 %.        Intake/Output Summary (Last 24 hours) at 01/15/2022 0936 Last data filed at 01/15/2022 0309 Gross per 24 hour  Intake 100 ml  Output no documentation  Net 100 ml   Filed Weights   01/15/22 0007  Weight: 83.5 kg    Examination: General: 81 year old white female lying in bed currently in no acute distress however does exhibit some mild increased work of  breathing   HENT: Mucous membranes moist, no clear JVD Lungs: Dyspneic, fine crackles bases, mild accessory use Cardiovascular: Normal sinus rhythm, no murmur rub or gallop appreciated wide QRS on telemetry Abdomen: Soft not tender Extremities: Warm dry Neuro: Awake oriented x3, but does have difficulty with recall, and speech is slurred GU: Due to void  Resolved Hospital Problem list     Assessment & Plan:  Symptomatic severe hypoosmolar hyponatremia -Likely secondary to decompensated heart failure Plan Sending urine sodium, osmolality, creatinine Sending TSH, serum osmolality, and cortisol Check BNP Repeat chest x-ray Administer 100 mL of 3% saline now Will repeat chemistry following administration aiming for increased sodium to at least 115 to no higher than 117, may need to repeat sodium bolus pending results Following urine studies will administer 40 of intravenous Lasix Checking frequent sodiums   Acute decompensated heart failure with known EF 20%-25% Plan Serial BNP IV Lasix Telemetry Holding further antihypertensives, noting from heart failure clinic she had trouble with hypotension in the past which is why her ARB and Lasix had been discontinued  Dyspnea secondary to decompensated heart failure and pulmonary edema Plan Pulse oximeter Lasix Oxygen for sats greater than 90%  CKD stage II Plan Serial chemistries Strict intake output Renal dose medications as tolerated  History of COPD Plan Continue home bronchodilators  History of bipolar type I with psychotic behaviors, and extraparametal symptoms Plan Continue Risperdal Continuing Lexapro for now, however no SSRIs can contribute to hyponatremia Continue Symmetrel   Best Practice (right click and "Reselect all SmartList Selections" daily)   Diet/type: NPO w/ oral meds DVT prophylaxis: prophylactic heparin  GI prophylaxis: N/A  Lines: N/A Foley:  N/A Code Status:  full code Last date of  multidisciplinary goals of care discussion [pending   ]  Labs   CBC: Recent Labs  Lab 01/15/22 0105  WBC 7.6  NEUTROABS 6.3  HGB 12.6  HCT 35.4*  MCV 86.6  PLT 017    Basic Metabolic Panel: Recent Labs  Lab 01/15/22 0155 01/15/22 0301  NA 110* 111*  K 4.7 4.5  CL 79* 77*  CO2 22 24  GLUCOSE 118* 118*  BUN 10 10  CREATININE 0.62 0.65  CALCIUM 8.1* 8.2*   GFR: Estimated Creatinine Clearance: 57.2 mL/min (by C-G formula based on SCr of 0.65 mg/dL). Recent Labs  Lab 01/15/22 0105 01/15/22 0153  WBC 7.6  --   LATICACIDVEN  --  0.8    Liver Function Tests: Recent Labs  Lab 01/15/22 0155 01/15/22 0301  AST 22 24  ALT 23 25  ALKPHOS 99 105  BILITOT 1.0 1.0  PROT 5.6* 6.0*  ALBUMIN 3.4* 3.7   No results for input(s): "LIPASE", "AMYLASE" in the last 168 hours. Recent Labs  Lab 01/15/22 0153  AMMONIA 17    ABG    Component Value Date/Time   PHART 7.38 01/15/2022 0120   PCO2ART 43 01/15/2022 0120   PO2ART 78 (L) 01/15/2022 0120   HCO3 25.4 01/15/2022 0120   O2SAT 97.7 01/15/2022 0120     Coagulation Profile: No results for input(s): "INR", "PROTIME" in the last 168 hours.  Cardiac Enzymes: No results for input(s): "CKTOTAL", "CKMB", "CKMBINDEX", "TROPONINI" in the last 168 hours.  HbA1C: No results found for: "HGBA1C"  CBG: Recent Labs  Lab 01/15/22 0100  GLUCAP 123*    Review of Systems:   Not able   Past Medical History:  She,  has a past medical history of Anemia (1990), Anxiety, Arthritis (2004), Asthma (1995), Chronic kidney disease (2010), Colon polyp, Complication of anesthesia (1966), Delusional disorder ([place]), Diverticulosis (2003), GERD (gastroesophageal reflux disease), and Schizophrenia ([place]).   Surgical History:   Past Surgical History:  Procedure Laterality Date   APPENDECTOMY  1966   CATARACT EXTRACTION     Bilateral   CHOLECYSTECTOMY  2004   COLON SURGERY  2008   COLONOSCOPY  2013   JP-MAC-moviprep(good)-TA    COLOSTOMY     COLOSTOMY TAKEDOWN     DIAGNOSTIC LAPAROSCOPY     exploratory for endometriosis   HEEL SPUR SURGERY     Right, Metal Plate in [place]  2010   NEPHRECTOMY Left 4944   As complication of partial colectomy for diverticulitis   POLYPECTOMY  2013   TA   TONSILLECTOMY     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   Procedure: LEFT TOTAL KNEE ARTHROPLASTY;  Surgeon: Susa Day, MD;  Location: [facility];  Service: Orthopedics;  Laterality: Left;  Adductor Block     Social History:   reports that she has never smoked. She has never used smokeless tobacco. She reports that she does not currently use alcohol. She reports that she does not use drugs.   Family History:  Her family history includes COPD in her father; Colon polyps in an other family member; Diabetes in her paternal aunt and paternal grandmother; Heart attack in her father; Heart failure in her mother; Hyperlipidemia in her mother; Hypertension in her father and mother; Stroke in her mother. There is no history of Stomach cancer, Rectal cancer, Esophageal cancer, or Colon cancer.   Allergies Allergies  Allergen Reactions  Haloperidol Anaphylaxis   Aripiprazole Other (See Comments)    Bad thoughts    Chlorpheniramine-Pse-Ibuprofen     Other reaction(s): Cough     Home Medications  Prior to Admission medications   Medication Sig Start Date End Date Taking? Authorizing Provider  acetaminophen (TYLENOL) 325 MG tablet Take 650 mg by mouth every 6 (six) hours as needed.    [provider]  amantadine (SYMMETREL) 100 MG capsule Take 100 mg by mouth 2 (two) times daily. 05/03/21   [provider]  antiseptic oral rinse (BIOTENE) LIQD 15 mLs by Mouth Rinse route as needed for dry mouth.    [provider]  BREO ELLIPTA 200-25 MCG/INH AEPB Inhale 1 puff into the lungs daily. 06/22/20   [provider]  cetirizine (ZYRTEC) 10 MG tablet Take 10 mg by mouth daily.     [provider]  cyanocobalamin 1000 MCG tablet Take 1,000 mcg by mouth daily.    [provider]  escitalopram (LEXAPRO) 10 MG tablet Take 10 mg by mouth daily. 05/03/21   [provider]  fluticasone (FLONASE) 50 MCG/ACT nasal spray Place 2 sprays into both nostrils daily. 05/02/21   [provider]  gabapentin (NEURONTIN) 100 MG capsule Take 100 mg by mouth at bedtime.    [provider]  melatonin 3 MG TABS tablet Take 3 mg by mouth at bedtime.    [provider]  Multiple Vitamins-Minerals (PRESERVISION AREDS) TABS Take 1 tablet by mouth in the morning and at bedtime.    [provider]  omeprazole (PRILOSEC) 20 MG capsule Take 1 capsule (20 mg total) by mouth daily. 06/19/19   Palumbo, April, MD  risperiDONE (RISPERDAL) 1 MG tablet Take 1 tablet (1 mg total) by mouth at bedtime. 10/27/21   Johnson, Clanford L, MD  traZODone (DESYREL) 150 MG tablet Take 150 mg by mouth at bedtime. 05/03/21   [provider]     Critical care time: 32 min     Erick Colace ACNP-BC [place] Pager # [phone] OR # [phone] if no answer

## 2022-01-15 NOTE — ED Notes (Signed)
Date and time results received: 01/15/22 0242 (use smartphrase ".now" to insert current time)  Test: sodium Critical Value: 110  Name of Provider Notified: D.Christy Gentles, MD

## 2022-01-15 NOTE — Progress Notes (Signed)
Peripherally Inserted Central Catheter Placement  The IV Nurse has discussed with the patient and/or persons authorized to consent for the patient, the purpose of this procedure and the potential benefits and risks involved with this procedure.  The benefits include less needle sticks, lab draws from the catheter, and the patient may be discharged home with the catheter. Risks include, but not limited to, infection, bleeding, blood clot (thrombus formation), and puncture of an artery; nerve damage and irregular heartbeat and possibility to perform a PICC exchange if needed/ordered by physician.  Alternatives to this procedure were also discussed.  Bard Power PICC patient education guide, fact sheet on infection prevention and patient information card has been provided to patient /or left at bedside.    PICC Placement Documentation  PICC Double Lumen 75/88/32 Right Basilic 39 cm 0 cm (Active)  Indication for Insertion or Continuance of Line Vasoactive infusions 01/15/22 1718  Exposed Catheter (cm) 0 cm 01/15/22 1718  Site Assessment Clean, Dry, Intact 01/15/22 1718  Lumen #1 Status Flushed;Saline locked;Blood return noted 01/15/22 1718  Lumen #2 Status Flushed;Saline locked;Blood return noted 01/15/22 1718  Dressing Type Transparent;Securing device 01/15/22 1718  Dressing Status Antimicrobial disc in place;Clean, Dry, Intact 01/15/22 1718  Safety Lock Not Applicable 54/98/26 4158  Line Care Connections checked and tightened 01/15/22 1718  Line Adjustment (NICU/IV Team Only) No 01/15/22 1718  Dressing Intervention New dressing;Other (Comment) 01/15/22 1718  Dressing Change Due 01/22/22 01/15/22 1718       Enos Fling 01/15/2022, 5:19 PM

## 2022-01-15 NOTE — Progress Notes (Signed)
[facility] Progress Note Patient Name: [name] DOB: 1941/03/08 MRN: [id number]   Date of Service  01/15/2022  HPI/Events of Note  Patient with hypervolemic hyponatremia, serum sodium is up to 113 from 110 after two 100 ml boluses of 3 % saline and iv Lasix 40 mg x 2, she is negative 2 liters fluid balance (diuresis). Next serum sodium is in 2 hours.  eICU Interventions  Follow up on next serum sodium and make adjustments  from there.        Kerry Kass [name] 01/15/2022, 8:03 PM

## 2022-01-15 NOTE — Progress Notes (Signed)
Spoke with Noralee Space, niece regarding consent for PICC placement. Verified verbal consent x 2 VAST RN's.

## 2022-01-15 NOTE — ED Notes (Signed)
Date and time results received: 01/15/22 0320   Test: Sodium Critical Value: 111  Name of Provider Notified: [place]  Orders Received? Or Actions Taken?: Awaiting orders

## 2022-01-15 NOTE — ED Provider Notes (Signed)
Patient resting comfortably, no acute distress, she is arousable and answers most questions appropriately.  Awaiting transfer to Thornell Sartorius, MD 01/15/22 [phone]

## 2022-01-15 NOTE — ED Notes (Signed)
Patient alert to voice and able to carry on a small conversation. Alert & oriented to X2. Patient able to state name, situation and month, however was confused on presidency as she stated "Maudie Flakes." Patient has a small amount of urine in suction canister.

## 2022-01-16 DIAGNOSIS — N182 Chronic kidney disease, stage 2 (mild): Secondary | ICD-10-CM | POA: Diagnosis not present

## 2022-01-16 DIAGNOSIS — E222 Syndrome of inappropriate secretion of antidiuretic hormone: Secondary | ICD-10-CM | POA: Insufficient documentation

## 2022-01-16 DIAGNOSIS — F200 Paranoid schizophrenia: Secondary | ICD-10-CM

## 2022-01-16 DIAGNOSIS — E871 Hypo-osmolality and hyponatremia: Secondary | ICD-10-CM | POA: Diagnosis not present

## 2022-01-16 DIAGNOSIS — I5020 Unspecified systolic (congestive) heart failure: Secondary | ICD-10-CM | POA: Diagnosis not present

## 2022-01-16 LAB — BASIC METABOLIC PANEL
Anion gap: 10 (ref 5–15)
BUN: 9 mg/dL (ref 8–23)
CO2: 28 mmol/L (ref 22–32)
Calcium: 7.8 mg/dL — ABNORMAL LOW (ref 8.9–10.3)
Chloride: 80 mmol/L — ABNORMAL LOW (ref 98–111)
Creatinine, Ser: 0.81 mg/dL (ref 0.44–1.00)
GFR, Estimated: >60 mL/min (ref >60)
Glucose, Bld: 85 mg/dL (ref 70–99)
Potassium: 4.1 mmol/L (ref 3.5–5.1)
Sodium: 118 mmol/L — CL (ref 135–145)

## 2022-01-16 LAB — SODIUM
Sodium: 114 mmol/L — CL (ref 135–145)
Sodium: 114 mmol/L — CL (ref 135–145)
Sodium: 115 mmol/L — CL (ref 135–145)
Sodium: 118 mmol/L — CL (ref 135–145)
Sodium: 118 mmol/L — CL (ref 135–145)
Sodium: 117 mmol/L — CL (ref 135–145)
Sodium: 119 mmol/L — CL (ref 135–145)
Sodium: 136 mmol/L (ref 135–145)
Sodium: 122 mmol/L — ABNORMAL LOW (ref 135–145)

## 2022-01-16 LAB — CBC
HCT: 33 % — ABNORMAL LOW (ref 36.0–46.0)
Hemoglobin: 11.3 g/dL — ABNORMAL LOW (ref 12.0–15.0)
MCH: 30.5 pg (ref 26.0–34.0)
MCHC: 34.2 g/dL (ref 30.0–36.0)
MCV: 88.9 fL (ref 80.0–100.0)
Platelets: 201 10*3/uL (ref 150–400)
RBC: 3.71 MIL/uL — ABNORMAL LOW (ref 3.87–5.11)
RDW: 12 % (ref 11.5–15.5)
WBC: 5.8 10*3/uL (ref 4.0–10.5)
nRBC: 0 % (ref 0.0–0.2)

## 2022-01-16 LAB — URINE CULTURE: Culture: NO GROWTH

## 2022-01-16 LAB — COOXEMETRY PANEL
Carboxyhemoglobin: 2.5 % — ABNORMAL HIGH (ref 0.5–1.5)
Methemoglobin: <0.7 % (ref 0.0–1.5)
O2 Saturation: 86 %
Total hemoglobin: 11.6 g/dL — ABNORMAL LOW (ref 12.0–16.0)

## 2022-01-16 LAB — BRAIN NATRIURETIC PEPTIDE: B Natriuretic Peptide: 1148.7 pg/mL — ABNORMAL HIGH (ref 0.0–100.0)

## 2022-01-16 MED ORDER — SODIUM CHLORIDE 3 % IV SOLN
INTRAVENOUS | Status: DC
  Filled 2022-01-16 (×2): qty 500

## 2022-01-16 MED ORDER — FUROSEMIDE 10 MG/ML IJ SOLN
40.0000 mg | Freq: Once | INTRAMUSCULAR | Status: AC
  Administered 2022-01-16: 40 mg via INTRAVENOUS
  Filled 2022-01-16: qty 4

## 2022-01-16 MED ORDER — SODIUM CHLORIDE 3 % IV BOLUS
100.0000 mL | Freq: Once | INTRAVENOUS | Status: AC
  Administered 2022-01-16: 100 mL via INTRAVENOUS
  Filled 2022-01-16 (×2): qty 500

## 2022-01-16 NOTE — Evaluation (Signed)
Clinical/Bedside Swallow Evaluation Patient Details  Name: [name] MRN: [id number] Date of Birth: 02-20-1941  Today's Date: 01/16/2022 Time: SLP Start Time (ACUTE ONLY): 6213 SLP Stop Time (ACUTE ONLY): 1535 SLP Time Calculation (min) (ACUTE ONLY): 20 min  Past Medical History:  Past Medical History:  Diagnosis Date   Anemia 1990   hx of   Anxiety    Arthritis 2004   Asthma 1995   uses inhaler   Chronic kidney disease 2010   LEFT kidney removed   Colon polyp    Complication of anesthesia 1966   first surgery she had- had a hard time waking up from surgery   Delusional disorder ([place])    Six delusional disorder   Diverticulosis 2003   GERD (gastroesophageal reflux disease)    on meds   Schizophrenia [facility])    Past Surgical History:  Past Surgical History:  Procedure Laterality Date   APPENDECTOMY  1966   CATARACT EXTRACTION     Bilateral   CHOLECYSTECTOMY  2004   COLON SURGERY  2008   COLONOSCOPY  2013   JP-MAC-moviprep(good)-TA   COLOSTOMY     COLOSTOMY TAKEDOWN     DIAGNOSTIC LAPAROSCOPY     exploratory for endometriosis   HEEL SPUR SURGERY     Right, Metal Plate in [place]   [place]  2010   NEPHRECTOMY Left 0865   As complication of partial colectomy for diverticulitis   POLYPECTOMY  2013   TA   TONSILLECTOMY     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   Procedure: LEFT TOTAL KNEE ARTHROPLASTY;  Surgeon: Susa Day, MD;  Location: [facility];  Service: Orthopedics;  Laterality: Left;  Adductor Block   HPI:  Patient is a 81  y.o. female with PMH: HFrEF, GERD, HTN, anxiety, schizophrenia, CKD stage II presented to [facility] from her ALF with AMS.  Found to have severe hyponatremia and pulmonary/peripheral edema.  Transferred to [facility] on 01/15/22 for further management. On 10/10 RN observed patient to have a choking episode while eating lunch meal during which patient became blue in the face, had desaturation of oxygen into 70's with cyanosis. MD  changed patient to NPO awaiting SLP to evaluate swallow function.    Assessment / Plan / Recommendation  Clinical Impression  Patient presenting with clinical s/s of dysphagia as per this bedside/clinical swallow evaluation. Patient seemed to indicate that she has a history of some globus sensation with solids and liquids but no family to confirm and overall, patient does not appear to be the best historian. She told SLP "I have polyps in my throat" and she acknowledged that this interferes with her swallowing. SLP observed her with puree solids (applesauce) and thin liquids. She exhibited inconsistent, hoarse and dry coughing. It was difficult to differentiate between her baseline coughing but SLP did observe an increase in frequency of coughing during and after PO's as compared to baseline cough .SLP recommending initiate full liquids diet and will proceed with MBS next date. SLP Visit Diagnosis: Dysphagia, unspecified (R13.10)    Aspiration Risk  Mild aspiration risk;Moderate aspiration risk    Diet Recommendation Thin liquid   Liquid Administration via: Cup;Straw Medication Administration: Crushed with puree Supervision: Patient able to self feed;Intermittent supervision to cue for compensatory strategies Compensations: Slow rate;Small sips/bites Postural Changes: Seated upright at 90 degrees;Remain upright for at least 30 minutes after po intake    Other  Recommendations Oral Care Recommendations: Oral care BID;Staff/trained caregiver to provide oral care  Recommendations for follow up therapy are one component of a multi-disciplinary discharge planning process, led by the attending physician.  Recommendations may be updated based on patient status, additional functional criteria and insurance authorization.  Follow up Recommendations Other (comment) (TBD)      Assistance Recommended at Discharge Frequent or constant Supervision/Assistance  Functional Status Assessment Patient has  had a recent decline in their functional status and demonstrates the ability to make significant improvements in function in a reasonable and predictable amount of time.  Frequency and Duration min 2x/week  1 week       Prognosis Prognosis for Safe Diet Advancement: Good      Swallow Study   General Date of Onset: 01/16/22 HPI: Patient is a 81  y.o. female with PMH: HFrEF, GERD, HTN, anxiety, schizophrenia, CKD stage II presented to [facility] from her ALF with AMS.  Found to have severe hyponatremia and pulmonary/peripheral edema.  Transferred to [facility] on 01/15/22 for further management. On 10/10 RN observed patient to have a choking episode while eating lunch meal during which patient became blue in the face, had desaturation of oxygen into 70's with cyanosis. MD changed patient to NPO awaiting SLP to evaluate swallow function. Type of Study: Bedside Swallow Evaluation Previous Swallow Assessment: none found Diet Prior to this Study: NPO Temperature Spikes Noted: No History of Recent Intubation: No Behavior/Cognition: Alert;Cooperative;Pleasant mood Oral Cavity Assessment: Within Functional Limits Oral Care Completed by SLP: Recent completion by staff Oral Cavity - Dentition: Adequate natural dentition Vision: Functional for self-feeding Self-Feeding Abilities: Able to feed self Patient Positioning: Upright in bed Baseline Vocal Quality: Normal Volitional Cough: Strong Volitional Swallow: Able to elicit    Oral/Motor/Sensory Function Overall Oral Motor/Sensory Function: Within functional limits   Ice Chips     Thin Liquid Thin Liquid: Impaired Presentation: Straw;Self Fed Pharyngeal  Phase Impairments: Other (comments) Other Comments: delayed coughing which was hoarse and dry; difficult to differentiate from her baseline coughing    Nectar Thick     Honey Thick     Puree Puree: Impaired Presentation: Self Fed Pharyngeal Phase Impairments: Other (comments) Other Comments: delayed  coughing which was hoarse and dry; difficult to differentiate from her baseline coughing   Solid     Solid: Not tested      Sonia Baller, MA, CCC-SLP Speech Therapy

## 2022-01-16 NOTE — Progress Notes (Signed)
NAME:  [name], MRN:  [id number], DOB:  03/25/1941, LOS: 1 ADMISSION DATE:  01/14/2022, CONSULTATION DATE:  10/9 REFERRING MD:  Christy Gentles, CHIEF COMPLAINT:  hyponatremia    History of Present Illness:  81 year old female patient who presented to [facility] from assisted care.  Presented with altered mental status, following about 2 weeks of progressive weakness.  In the emergency department was found to be short of breath, requiring supplemental oxygen, sodium was 111, BUN 10, creatinine 0.62 CT of head was negative chest x-ray demonstrating mild pulmonary edema.  Because of her symptomatic hyponatremia she was transferred to [facility] for treatment  Pertinent  Medical History  Systolic heart failure with a EF 20 to 25%, CKD stage II, GERD, hypertension, anxiety, diverticulosis. Resides at assisted living typically ambulatory  Significant Hospital Events: Including procedures, antibiotic start and stop dates in addition to other pertinent events   10/9 presented from assisted living with altered mental status, and approximately 2 weeks of progressive weakness.  Working diagnosis decompensated heart failure with symptomatic hyponatremia, urine studies sent, administered 3% sodium on arrival due to symptomatic hyponatremia.  Urine Osmo 605, received 2 doses of 3% saline.  IV diuresis 10/10 sodium up to 115 from its lowest point of 110.  Mental status improved.  Cortisol and TSH normal.  Blood pressure borderline following 3 doses of diuresis.  Evaluation suggesting at least some component of SIADH.  SSRI stopped, had already been holding trazodone.   Interim History / Subjective:  Speech is less slurred  Objective   Blood pressure (Abnormal) 102/59, pulse 69, temperature 98.8 F (37.1 C), resp. rate 10, height '5\' 2"'$  (1.575 m), weight 83.2 kg, SpO2 94 %.        Intake/Output Summary (Last 24 hours) at 01/16/2022 0849 Last data filed at 01/16/2022 0800 Gross per 24 hour   Intake 295.06 ml  Output 3500 ml  Net -3204.94 ml   Filed Weights   01/15/22 0007 01/16/22 0444  Weight: 83.5 kg 83.2 kg    Examination: General: 81 year old female resting in bed no acute distress this morning. HEENT normocephalic atraumatic mucous membranes slightly dry Pulmonary: Scattered rhonchi no accessory use currently on nasal cannula Cardiac: Regular rate and rhythm Abdomen: Soft nontender Extremities: Warm dry brisk cap refill trace lower extremity edema Neuro: Awake oriented x3 recall a little bit better today  Resolved Hospital Problem list     Assessment & Plan:   Principal Problem:   Hyponatremia Active Problems:   SIADH (syndrome of inappropriate ADH production) (HCC)   Systolic heart failure (HCC)   CKD (chronic kidney disease), stage II   Paranoid schizophrenia (HCC)   Severe manic bipolar 1 disorder with psychotic behavior (HCC)   Pressure injury of skin    Symptomatic severe hypoosmolar hyponatremia -Status post 2 doses of 3% saline -Urine osmolality 605 suggesting at least some element of SIADH -TSH normal, cortisol normal -Sodium 115, which was our goal from 110 Plan Volume restriction Ck CVP If Na still < 120 as of am labs will start 3% infusion (goal raise Na no faster than 4-6 in 24hrs) can stop infusion after Na >/=125 Stop SSRI Might need to consider stopping trazodone as well Daily assessment for Lasix  Acute metabolic encephalopathy secondary to above.  Slowly improving. Plan Supportive care  Acute decompensated heart failure with known EF 20%-25% BNP still elevated Plan Cont daily assessment for IV lasix (hold today as SBP only in 90s) Holding antihypertensives Tele  Dyspnea secondary to decompensated heart failure and pulmonary edema Plan Pulse ox Mobilize OOB O2 as needed Repeat chest x-ray today  CKD stage II Plan Serial chems Renal dose meds  History of COPD Plan BDs  History of bipolar type I with  psychotic behaviors, and extraparametal symptoms Plan Cont risperdal  Stop lexapro  Cont symmetrell   Best Practice (right click and "Reselect all SmartList Selections" daily)   Diet/type: NPO w/ oral meds DVT prophylaxis: prophylactic heparin  GI prophylaxis: N/A Lines: N/A Foley:  N/A Code Status:  full code Last date of multidisciplinary goals of care discussion [pending     Critical care time: 35 minutes   Erick Colace ACNP-BC [place] Pager # [phone] OR # [phone] if no answer

## 2022-01-16 NOTE — Progress Notes (Signed)
[facility] Progress Note Patient Name: [name] DOB: 03/24/1941 MRN: [id number]   Date of Service  01/16/2022  HPI/Events of Note  Serum sodium now 122.  eICU Interventions  3 % saline gtt rate reduced to 20 ml / hour.        Kerry Kass Darnesha Diloreto 01/16/2022, 10:56 PM

## 2022-01-16 NOTE — Progress Notes (Signed)
[facility] Progress Note Patient Name: [name] DOB: 04-Oct-1940 MRN: [id number]   Date of Service  01/16/2022  HPI/Events of Note  Serum sodium 113, urine output averaging 75 ml / hour over the past 4 hours.   eICU Interventions  Lasix 40 mg iv x 1 now. If no significant rise in serum sodium with next measurement will re-bolus 100 ml of 3 % saline.        Kerry Kass Stachia Slutsky 01/16/2022, 12:05 AM

## 2022-01-16 NOTE — Progress Notes (Signed)
SLP Cancellation Note  Patient Details Name: [name] MRN: [id number] DOB: March 14, 1941   Cancelled treatment:       Reason Eval/Treat Not Completed: Patient's level of consciousness. Patient very lethargic and only able to briefly awaken. SLP will return later this PM; continue NPO status.   Sonia Baller, MA, CCC-SLP Speech Therapy

## 2022-01-16 NOTE — Progress Notes (Signed)
Pt had choking episode while eating lunch.  Had oxygen desaturation into 70's with cyanosis.  Airway cleared and SpO2 improved.  Breath sounds clear.  Has sore, hoarse throat.  Denies chest pain or chest congestion.  Will make NPO for now and get speech to assess swallowing.  Will f/u CXR on 10/11.  Chesley Mires, MD [place] Pager - [phone] 01/16/2022, 12:58 PM

## 2022-01-16 NOTE — Progress Notes (Signed)
  Transition of Care [facility]) Screening Note   Patient Details  Name: [name] Date of Birth: [birth year]   Transition of Care [facility]) CM/SW Contact:    Tom-Johnson, Renea Ee, RN Phone Number: 01/16/2022, 1:40 PM  Transition of Care Department (TOC) has reviewed patient and no TOC needs or recommendations have been identified at this time. TOC will continue to monitor patient advancement through interdisciplinary progression rounds. If new patient transition needs arise, please place a TOC consult.

## 2022-01-17 ENCOUNTER — Inpatient Hospital Stay (HOSPITAL_COMMUNITY): Payer: Medicare Other

## 2022-01-17 DIAGNOSIS — F200 Paranoid schizophrenia: Secondary | ICD-10-CM | POA: Diagnosis not present

## 2022-01-17 DIAGNOSIS — E871 Hypo-osmolality and hyponatremia: Secondary | ICD-10-CM | POA: Diagnosis not present

## 2022-01-17 LAB — BASIC METABOLIC PANEL
Anion gap: 7 (ref 5–15)
BUN: 10 mg/dL (ref 8–23)
CO2: 29 mmol/L (ref 22–32)
Calcium: 7.9 mg/dL — ABNORMAL LOW (ref 8.9–10.3)
Chloride: 86 mmol/L — ABNORMAL LOW (ref 98–111)
Creatinine, Ser: 0.8 mg/dL (ref 0.44–1.00)
GFR, Estimated: >60 mL/min (ref >60)
Glucose, Bld: 99 mg/dL (ref 70–99)
Potassium: 4.3 mmol/L (ref 3.5–5.1)
Sodium: 122 mmol/L — ABNORMAL LOW (ref 135–145)

## 2022-01-17 LAB — SODIUM
Sodium: 121 mmol/L — ABNORMAL LOW (ref 135–145)
Sodium: 123 mmol/L — ABNORMAL LOW (ref 135–145)
Sodium: 124 mmol/L — ABNORMAL LOW (ref 135–145)
Sodium: 127 mmol/L — ABNORMAL LOW (ref 135–145)

## 2022-01-17 LAB — GLUCOSE, CAPILLARY: Glucose-Capillary: 115 mg/dL — ABNORMAL HIGH (ref 70–99)

## 2022-01-17 MED ORDER — FUROSEMIDE 10 MG/ML IJ SOLN
40.0000 mg | Freq: Four times a day (QID) | INTRAMUSCULAR | Status: AC
  Administered 2022-01-17 (×2): 40 mg via INTRAVENOUS
  Filled 2022-01-17 (×2): qty 4

## 2022-01-17 MED ORDER — ACETAMINOPHEN 325 MG PO TABS
650.0000 mg | ORAL_TABLET | Freq: Four times a day (QID) | ORAL | Status: DC | PRN
  Filled 2022-01-17: qty 2

## 2022-01-17 MED ORDER — RISPERIDONE 1 MG PO TABS
1.0000 mg | ORAL_TABLET | Freq: Every day | ORAL | Status: DC
  Administered 2022-01-17 – 2022-01-22 (×6): 1 mg via ORAL
  Filled 2022-01-17 (×3): qty 1
  Filled 2022-01-17: qty 2
  Filled 2022-01-17 (×3): qty 1

## 2022-01-17 NOTE — Progress Notes (Signed)
NAME:  [name], MRN:  [id number], DOB:  03/29/1941, LOS: 2 ADMISSION DATE:  01/14/2022, CONSULTATION DATE:  10/9 REFERRING MD:  Christy Gentles, CHIEF COMPLAINT:  hyponatremia    History of Present Illness:  81 year old female patient who presented to [facility] from assisted care.  Presented with altered mental status, following about 2 weeks of progressive weakness.  In the emergency department was found to be short of breath, requiring supplemental oxygen, sodium was 111, BUN 10, creatinine 0.62 CT of head was negative chest x-ray demonstrating mild pulmonary edema.  Because of her symptomatic hyponatremia she was transferred to [facility] for treatment  Pertinent  Medical History  Systolic heart failure with a EF 20 to 25%, CKD stage II, GERD, hypertension, anxiety, diverticulosis. Resides at assisted living typically ambulatory  Significant Hospital Events: Including procedures, antibiotic start and stop dates in addition to other pertinent events   10/9 presented from assisted living with altered mental status, and approximately 2 weeks of progressive weakness.  Working diagnosis decompensated heart failure with symptomatic hyponatremia, urine studies sent, administered 3% sodium on arrival due to symptomatic hyponatremia.  Urine Osmo 605, received 2 doses of 3% saline.  IV diuresis 10/10 sodium up to 115 from its lowest point of 110.  Mental status improved.  Cortisol and TSH normal.  Blood pressure borderline following 3 doses of diuresis.  Evaluation suggesting at least some component of SIADH.  SSRI stopped, had already been holding trazodone.  Had episode of choking, SLP consulted 10/11 more awake, oriented.  Able to say she is here because of hyponatremia no distress.  Interim History / Subjective:  Beach/slurred.  Much more awake and alert  Objective   Blood pressure 119/65, pulse 82, temperature 98.1 F (36.7 C), temperature source Bladder, resp. rate (Abnormal) 8,  height '5\' 2"'$  (1.575 m), weight 85.4 kg, SpO2 96 %. CVP:  [12 mmHg-17 mmHg] 17 mmHg      Intake/Output Summary (Last 24 hours) at 01/17/2022 0726 Last data filed at 01/17/2022 0600 Gross per 24 hour  Intake 1330.34 ml  Output 910 ml  Net 420.34 ml   Filed Weights   01/15/22 0007 01/16/22 0444 01/17/22 0600  Weight: 83.5 kg 83.2 kg 85.4 kg    Examination: General this is a 81 year old white female she is resting in bed currently eating her breakfast she is in no acute distress HEENT normocephalic atraumatic no jugular venous distention appreciated mucous membranes are moist Pulmonary: Some scattered rhonchi, diminished bases.  Currently on 3 L/min.  Her most recent chest x-ray : This showed bilateral interstitial airspace disease, consistent with pulmonary edema also appears to be mild bilateral effusions.  Aeration a little worse when compared to day prior. Cardiac: Regular rate and rhythm abdomen: Soft nontender extremities: Warm dry brisk cap refill Neuro: Awake oriented x4.  No focal deficits much more engaged GU: Foley catheter in place clear yellow urine.  Resolved Hospital Problem list     Assessment & Plan:   Principal Problem:   Hyponatremia Active Problems:   SIADH (syndrome of inappropriate ADH production) (HCC)   Systolic heart failure (HCC)   CKD (chronic kidney disease), stage II   Paranoid schizophrenia (HCC)   Severe manic bipolar 1 disorder with psychotic behavior (HCC)   Pressure injury of skin    Symptomatic severe hypoosmolar hyponatremia -Status post 2 doses of 3% saline -Urine osmolality 605 suggesting at least some element of SIADH -TSH normal, cortisol normal -Sodium 115, which was our goal  from 110 Plan Cont volume restriction  Qshift CVP Cont 3% saline until Na > 125 Ssri and trazadone stopped Daily assessment for lasix--> adding Lasix today again   Acute metabolic encephalopathy secondary to above.  Slowly improving. Plan Supportive care    Acute decompensated heart failure with known EF 20%-25% Plan Tele  Lasix today  Dyspnea secondary to decompensated heart failure and pulmonary edema Plan Pulse ox Mobilize  OOB as tol IS  Dysphagia precautions   Dysphagia -hope will improve w/ time and correction of metabolic derrangements Plan SLP following  CKD stage II Plan Serial chems Renal dose meds  History of COPD Plan BDs  History of bipolar type I with psychotic behaviors, and extraparametal symptoms Plan Risperdal  Stopped Lexapro and tramadol  Cont symmtrel  Best Practice (right click and "Reselect all SmartList Selections" daily)   Diet/type: Regular consistency (see orders) DVT prophylaxis: prophylactic heparin  GI prophylaxis: N/A Lines: N/A Foley:  N/A Code Status:  full code Last date of multidisciplinary goals of care discussion [pending     Critical care time: 32 min    Erick Colace ACNP-BC [place] Pager # [phone] OR # [phone] if no answer

## 2022-01-17 NOTE — Progress Notes (Signed)
[facility] Progress Note Patient Name: [name] DOB: September 16, 1940 MRN: [id number]   Date of Service  01/17/2022  HPI/Events of Note  Hyponatremia - Na+ = 124 --> 127.   eICU Interventions  Plan: Hold 3% NaCl IV infusion.  Continue to trend Na+.      Intervention Category Major Interventions: Electrolyte abnormality - evaluation and management  [name] Eugene 01/17/2022, 10:56 PM

## 2022-01-17 NOTE — Progress Notes (Signed)
Modified Barium Swallow Progress Note  Patient Details  Name: [name] MRN: [id number] Date of Birth: [birth year]  Today's Date: 01/17/2022  Modified Barium Swallow completed.  Full report located under Chart Review in the Imaging Section.  Brief recommendations include the following:  Clinical Impression  Patient presents with a mild pharyngeal phase dysphagia but with oral phase of swallow that appears [facility] During pharyngeal phase, patient exhibited swallow intiation delays at level of vallecular sinus with puree solids nectar thick liquids and swallow initiation delays to level of pyriform with thin liquids (intermittently with nectar thick liquids). Trace, silent aspiration occured with thin liquids during the swallow when patient taking large sips and tilting head backwards. When patient kept head in neutral position and took small to average sized sips, no aspiration observed. Only trace vallecular and pyriform sinus residuals observed with thin liquids however subsequent swallows cleared residuals fully. Barium tablet taken with puree solids and soft solid textures both transited pharyngeally and through cervical esophagus without difficulty. SLP did not observe any swallow impairment during this MBS that would explain her recent choking event with solid texture PO's. Recommendation is for Dys 3 (mechanical soft)  solids and thin liquids. In addition, it is recommended that patient sit upright in bed or preferably, recliner chair during PO intake.   Swallow Evaluation Recommendations       SLP Diet Recommendations: Dysphagia 3 (Mech soft) solids;Thin liquid   Liquid Administration via: Straw;Cup   Medication Administration: Whole meds with puree   Supervision: Patient able to self feed;Intermittent supervision to cue for compensatory strategies   Compensations: Slow rate;Small sips/bites;Minimize environmental distractions   Postural Changes: Seated upright at 90  degrees   Oral Care Recommendations: Oral care BID       Sonia Baller, MA, CCC-SLP Speech Therapy

## 2022-01-17 NOTE — Consult Note (Signed)
[facility] New Face-to-Face Psychiatric Evaluation   Service Date: January 17, 2022 LOS:  LOS: 2 days    Assessment  [name] is a 81 y.o. female admitted medically for 01/14/2022 11:59 PM for encephalopathy likely secondary to severe hyponatremia. She carries the psychiatric diagnoses of paranoid schizophrenia, type 1 bipolar disorder and has a past medical history of CKD stage 2, systolic heart failure, pressure injuries.Psychiatry was consulted for psychotropic medication adjustments in setting of severe hyponatremia by Salvadore Dom, NP.    She meets criteria for paranoid schizophrenia and delusional disorder based on guardian/nieces collateral information.  Current outpatient psychotropic medications include Lexapro, risperidone, trazodone. Historically she has had a good response to these medications. She was compliant with medications prior to admission as evidenced by being given these at her assisted living facility. On initial examination, patient does not appear to be responding to internal stimuli nor endorsing paranoia/psychosis. Please see plan below for detailed recommendations.   Diagnoses:  Active Hospital problems: Principal Problem:   Hyponatremia Active Problems:   Paranoid schizophrenia (HCC)   Severe manic bipolar 1 disorder with psychotic behavior (HCC)   Pressure injury of skin   SIADH (syndrome of inappropriate ADH production) (HCC)   Systolic heart failure (HCC)   CKD (chronic kidney disease), stage II     Plan  ## Safety and Observation Level:  - Based on my clinical evaluation, I estimate the patient to be at minimal risk of self harm in the current setting  ## Medications:  -- Continue risperidone 1 mg daily for paranoid schizophrenia  -Given her 30+ year history of being on risperidone and relative psychiatric stability for the past year, continuing the risperidone outweighs the risk of a contributing to SIADH.  -Qtc 516 but  risk of decompensation of paranoid schizophrenia outweighs risk -- We will consider starting Ingrezza during hospitalization given her lipsmacking and guardians report of this involuntary movement for at least 2 to 3 months.  -We will do formal aims test tomorrow -- Continue to hold Lexapro and trazodone given possibility they are contributing to SIADH and recommend resuming these medications in outpatient setting  ## Medical Decision Making Capacity:  Did not formally assess  ## Further Work-up:  -- per primary team -- most recent EKG on 10/9 had QtC of 516 -- Pertinent labwork reviewed earlier this admission includes: Na 123 (on admission 110), BNP 1425.3>1148.7.  ## Disposition:  -- ALF once medically stable  ##Legal Status Patient has guardian (heather chambers, niece)  Thank you for this consult request. Recommendations have been communicated to the primary team.  We will continue to follow at this time.   France Ravens, MD   NEW  Relevant Aspects of Hospital Course:  Admitted on 01/14/2022 for severe hypoosmolar hyponatremia likely secondary to SIADH.  Patient Report:  Pt states she is doing "fine" but this morning she was in a "tizzy" because she was given liquids for breakfast. She states that she is diagnosed with "anxiety" and does not agree with diagnoses of schizophrenia or bipolar disorder. States her diagnosis had been modified several years ago.    Her anxiety symptoms are feeling jittery, worrying about things, denies worrying to the point of paranoia. Never anxious about people coming after her. No hallucinations per pt. Has had one lifetime panic attack while driving down a mountain. Felt "helpless".    She has experienced some depression. Depression symptoms include getting to the point that she doesn't know what to do.  Sometimes this affects her sleep. She does go days without sleep, longest she has gone without sleep is 3 days. At the end of this 3 day period she was  depressed and tired, denies having a lot of energy while not sleeping. She has experienced some slight appetite changes. She normally eats one full meal a day.    Denies any previous thoughts of wanting to end her life. Denies ever feeling hopeless. Denies any previous thoughts of wanting to hurt herself.   She is able to name that she takes risperdal for "anxiety" which has helped to calm her down.    Has had prior psych hospitalizations. Sounds like she was hospitalized for SI after expressing empathy for people with SI. She has had other psych hospitalizations which she does not remember the reason for. At least one was at [facility] for a month.    Normally talks with her niece. Aware that niece is guardian (niece is NP). Became her guardian after her mother died. Niece became guardian because pt not able to take care of herself. Feels like her niece is doing a good job.  She is oriented to self, generally to situation, month and year. When asked to count DOWB, counts from 7 to 1. With additional prompting, able to do DOWB with no issues. Difficulty with computation.   Endorses awareness of involuntary orofacial movements. States it is bothersome to her and has been ongoing prior to hospitalization.   Collateral information:  Spoke with guardian Occupational psychologist):  Patient was diagnosed with schizophrenia at the age of 49.  Patient has been hospitalized for multiple psychotic episodes including paranoia regarding people trying to kill her, seeing dead people in her yard, accusing her gynecologist for stealing her eggs and giving it to "the [place] community".  Patient has been IVC multiple times for bizarre behaviors as well as paranoia.  Patient also has fixed delusion of being married to "Ed" who is the brother of the first psychiatrist she met and endorses having multiple children with this person of which she is able to name them.  Niece states that patient will not mention these delusions  unprompted unless she is in a psychotic episode.  Niece does not think that there have been any major medication adjustments but she is also not currently aware of any that may have occurred in the past 3 months that may have led to her current state of hyponatremia.  Niece denies explicit manic episodes.  Niece does note that when patient is severely depressed, her psychosis and delusions seem to worsen. Niece states she has been on risperidone for approximately 30+ years.  She had previously been on Haldol which led to severe EPS but denies patient having explicit anaphylactic reaction to it.  Niece does note that in the past 3 months since she was last hospitalized in July 2023, she has noted that patient will have abnormal involuntary shoulder shrugs as well as lipsmacking that has not happened in the past. Niece states that patient has incorporated this into her delusion stating that "Ed keeps zapping me with electronic devices that are monitoring me" which leads to her lip smacking.   Niece states that her home psychotropic medication regiment has minimized mood lability and prevented further psychiatric hospitalizations or psychotic episodes.  Niece denies patient smokes or uses alcohol.  Psychiatric History:  Information collected from guardian See above. Briefly, diagnosis of schizophrenia since age of 16, significant EPS with haldol, multiple psychiatric hospitalizations and IVC. No  substance use history of note.  Social History:  Tobacco use: denies Alcohol use: denies Drug use: denies  Family History:  The patient's family history includes COPD in her father; Colon polyps in an other family member; Diabetes in her paternal aunt and paternal grandmother; Heart attack in her father; Heart failure in her mother; Hyperlipidemia in her mother; Hypertension in her father and mother; Stroke in her mother.  Medical History: Past Medical History:  Diagnosis Date   Anemia 1990   hx of    Anxiety    Arthritis 2004   Asthma 1995   uses inhaler   Chronic kidney disease 2010   LEFT kidney removed   Colon polyp    Complication of anesthesia 1966   first surgery she had- had a hard time waking up from surgery   Delusional disorder ([place])    Six delusional disorder   Diverticulosis 2003   GERD (gastroesophageal reflux disease)    on meds   Schizophrenia [facility])     Surgical History: Past Surgical History:  Procedure Laterality Date   APPENDECTOMY  1966   CATARACT EXTRACTION     Bilateral   CHOLECYSTECTOMY  2004   COLON SURGERY  2008   COLONOSCOPY  2013   JP-MAC-moviprep(good)-TA   COLOSTOMY     COLOSTOMY TAKEDOWN     DIAGNOSTIC LAPAROSCOPY     exploratory for endometriosis   HEEL SPUR SURGERY     Right, Metal Plate in Heel   [place]  2010   NEPHRECTOMY Left 8921   As complication of partial colectomy for diverticulitis   POLYPECTOMY  2013   TA   TONSILLECTOMY     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   Procedure: LEFT TOTAL KNEE ARTHROPLASTY;  Surgeon: Susa Day, MD;  Location: [facility];  Service: Orthopedics;  Laterality: Left;  Adductor Block    Medications:   Current Facility-Administered Medications:    acetaminophen (TYLENOL) tablet 650 mg, 650 mg, Oral, Q6H PRN, Erick Colace, NP   albuterol (PROVENTIL) (2.5 MG/3ML) 0.083% nebulizer solution 2.5 mg, 2.5 mg, Nebulization, Q4H PRN, Halford Chessman, Vineet, MD   amantadine (SYMMETREL) capsule 100 mg, 100 mg, Oral, BID, Salvadore Dom E, NP, 100 mg at 01/17/22 1018   arformoterol (BROVANA) nebulizer solution 15 mcg, 15 mcg, Nebulization, BID, Halford Chessman, Vineet, MD, 15 mcg at 01/17/22 0816   budesonide (PULMICORT) nebulizer solution 0.25 mg, 0.25 mg, Nebulization, BID, Halford Chessman, Vineet, MD, 0.25 mg at 01/17/22 0816   Chlorhexidine Gluconate Cloth 2 % PADS 6 each, 6 each, Topical, Daily, Erick Colace, NP, 6 each at 01/17/22 1018   docusate sodium (COLACE) capsule 100 mg, 100 mg, Oral, BID PRN,  Erick Colace, NP   fluticasone (FLONASE) 50 MCG/ACT nasal spray 2 spray, 2 spray, Each Nare, Daily, Erick Colace, NP, 2 spray at 01/17/22 1017   gabapentin (NEURONTIN) capsule 100 mg, 100 mg, Oral, QHS, Salvadore Dom E, NP, 100 mg at 01/16/22 2209   heparin injection 5,000 Units, 5,000 Units, Subcutaneous, Q8H, Erick Colace, NP, 5,000 Units at 01/17/22 1419   polyethylene glycol (MIRALAX / GLYCOLAX) packet 17 g, 17 g, Oral, Daily PRN, Erick Colace, NP   revefenacin (YUPELRI) nebulizer solution 175 mcg, 175 mcg, Nebulization, Daily, Chesley Mires, MD, 175 mcg at 01/17/22 0816   risperiDONE (RISPERDAL) tablet 1 mg, 1 mg, Oral, QHS, Babcock, Peter E, NP   sodium chloride (hypertonic) 3 % solution, , Intravenous, Continuous, Ogan, [place] U, MD, Last Rate: 20 mL/hr  at 01/17/22 0800, Infusion Verify at 01/17/22 0800   sodium chloride flush (NS) 0.9 % injection 10-40 mL, 10-40 mL, Intracatheter, Q12H, Sood, Vineet, MD, 10 mL at 01/17/22 1018   sodium chloride flush (NS) 0.9 % injection 10-40 mL, 10-40 mL, Intracatheter, PRN, Chesley Mires, MD  Allergies: Allergies  Allergen Reactions   Haloperidol Anaphylaxis   Aripiprazole Other (See Comments)    Bad thoughts    Chlorpheniramine-Pse-Ibuprofen     Other reaction(s): Cough       Objective  Vital signs:  Temp:  [97.9 F (36.6 C)-99 F (37.2 C)] 98.4 F (36.9 C) (10/11 1500) Pulse Rate:  [69-88] 76 (10/11 1500) Resp:  [8-25] 18 (10/11 1500) BP: (88-132)/(53-72) 113/58 (10/11 1500) SpO2:  [93 %-99 %] 99 % (10/11 1500) Weight:  [85.4 kg] 85.4 kg (10/11 0600)  Psychiatric Specialty Exam:  Presentation  General Appearance: Appropriate for Environment; Casual  Eye Contact:Good  Speech:Clear and Coherent; Normal Rate  Speech Volume:Normal  Handedness:No data recorded  Mood and Affect  Mood:Anxious  Affect:Flat   Thought Process  Thought Processes:Coherent; Goal Directed  Descriptions of  Associations:Intact  Orientation:Full (Time, Place and Person)  Thought Content:Logical  History of Schizophrenia/Schizoaffective disorder:No data recorded Duration of Psychotic Symptoms:No data recorded Hallucinations:Hallucinations: None  Ideas of Reference:Delusions  Suicidal Thoughts:Suicidal Thoughts: No  Homicidal Thoughts:Homicidal Thoughts: No   Sensorium  Memory:Immediate Good; Remote Poor; Recent Poor  Judgment:Impaired  Insight:Fair   Executive Functions  Concentration:Fair  Attention Span:Fair  [place]   Psychomotor Activity  Psychomotor Activity:Psychomotor Activity: -- (involuntary lip smacking)   Assets  Assets:Communication Skills; Housing; Social Support   Sleep  Sleep:Sleep: Fair    Physical Exam: Physical Exam ROS Blood pressure (!) 113/58, pulse 76, temperature 98.4 F (36.9 C), resp. rate 18, height _0  (1.575 m), weight 85.4 kg, SpO2 99 %. Body mass index is 34.44 kg/m.

## 2022-01-18 DIAGNOSIS — E871 Hypo-osmolality and hyponatremia: Secondary | ICD-10-CM | POA: Diagnosis not present

## 2022-01-18 LAB — SODIUM: Sodium: 125 mmol/L — ABNORMAL LOW (ref 135–145)

## 2022-01-18 LAB — BASIC METABOLIC PANEL
Anion gap: 11 (ref 5–15)
Anion gap: 7 (ref 5–15)
BUN: 6 mg/dL — ABNORMAL LOW (ref 8–23)
BUN: 10 mg/dL (ref 8–23)
CO2: 31 mmol/L (ref 22–32)
CO2: 34 mmol/L — ABNORMAL HIGH (ref 22–32)
Calcium: 8.4 mg/dL — ABNORMAL LOW (ref 8.9–10.3)
Calcium: 8.2 mg/dL — ABNORMAL LOW (ref 8.9–10.3)
Chloride: 86 mmol/L — ABNORMAL LOW (ref 98–111)
Chloride: 84 mmol/L — ABNORMAL LOW (ref 98–111)
Creatinine, Ser: 0.8 mg/dL (ref 0.44–1.00)
Creatinine, Ser: 0.75 mg/dL (ref 0.44–1.00)
GFR, Estimated: >60 mL/min (ref >60)
GFR, Estimated: >60 mL/min (ref >60)
Glucose, Bld: 139 mg/dL — ABNORMAL HIGH (ref 70–99)
Glucose, Bld: 112 mg/dL — ABNORMAL HIGH (ref 70–99)
Potassium: 3.8 mmol/L (ref 3.5–5.1)
Potassium: 4.2 mmol/L (ref 3.5–5.1)
Sodium: 128 mmol/L — ABNORMAL LOW (ref 135–145)
Sodium: 125 mmol/L — ABNORMAL LOW (ref 135–145)

## 2022-01-18 MED ORDER — ORAL CARE MOUTH RINSE: 15.0000 mL | OROMUCOSAL | Status: DC | PRN

## 2022-01-18 MED ORDER — FUROSEMIDE 10 MG/ML IJ SOLN
40.0000 mg | Freq: Once | INTRAMUSCULAR | Status: AC
  Administered 2022-01-18: 40 mg via INTRAVENOUS
  Filled 2022-01-18: qty 4

## 2022-01-18 MED ORDER — ORAL CARE MOUTH RINSE
15.0000 mL | OROMUCOSAL | Status: DC
  Administered 2022-01-18 – 2022-01-23 (×18): 15 mL via OROMUCOSAL

## 2022-01-18 MED ORDER — SODIUM CHLORIDE 0.9 % IV SOLN: INTRAVENOUS | Status: DC

## 2022-01-18 MED ORDER — UMECLIDINIUM-VILANTEROL 62.5-25 MCG/ACT IN AEPB
1.0000 | INHALATION_SPRAY | Freq: Every day | RESPIRATORY_TRACT | Status: DC
  Administered 2022-01-20 – 2022-01-21 (×2): 1 via RESPIRATORY_TRACT
  Filled 2022-01-18 (×2): qty 14

## 2022-01-18 NOTE — Progress Notes (Signed)
Speech Language Pathology Treatment: Dysphagia  Patient Details Name: [name] MRN: [id number] DOB: 05-Jul-1940 Today's Date: 01/18/2022 Time: 4680-3212 SLP Time Calculation (min) (ACUTE ONLY): 15 min  Assessment / Plan / Recommendation Clinical Impression  Patient seen by SLP for skilled treatment focused on dysphagia goals. Patient awake and alert and per RN she has done well with PO intake, still with intermittent coughing though. SLP observed patient with straw sips of thin liquids and no overt s/s aspiration or penetration observed and patient's vitals and voice remained WFL. SLP recommending to continue on Dys 3 solids, thin liquids diet and will follow for continued toleration    HPI HPI: Patient is a 81  y.o. female with PMH: HFrEF, GERD, HTN, anxiety, schizophrenia, CKD stage II presented to [facility] from her ALF with AMS.  Found to have severe hyponatremia and pulmonary/peripheral edema.  Transferred to [facility] on 01/15/22 for further management. On 10/10 RN observed patient to have a choking episode while eating lunch meal during which patient became blue in the face, had desaturation of oxygen into 70's with cyanosis. MD changed patient to NPO awaiting SLP to evaluate swallow function.      SLP Plan  Continue with current plan of care      Recommendations for follow up therapy are one component of a multi-disciplinary discharge planning process, led by the attending physician.  Recommendations may be updated based on patient status, additional functional criteria and insurance authorization.    Recommendations  Diet recommendations: Dysphagia 3 (mechanical soft);Thin liquid Liquids provided via: Cup;Straw Medication Administration: Whole meds with puree Supervision: Patient able to self feed;Intermittent supervision to cue for compensatory strategies Compensations: Slow rate;Small sips/bites;Minimize environmental distractions Postural Changes and/or Swallow Maneuvers: Seated  upright 90 degrees                Oral Care Recommendations: Oral care BID;Staff/trained caregiver to provide oral care Follow Up Recommendations: Follow physician's recommendations for discharge plan and follow up therapies Assistance recommended at discharge: Frequent or constant Supervision/Assistance SLP Visit Diagnosis: Dysphagia, unspecified (R13.10) Plan: Continue with current plan of care          Sonia Baller, MA, CCC-SLP Speech Therapy

## 2022-01-18 NOTE — Consult Note (Signed)
[facility] New Face-to-Face Psychiatric Evaluation   Service Date: January 18, 2022 LOS:  LOS: 3 days    Assessment  [name] is a 81 y.o. female admitted medically for 01/14/2022 11:59 PM for encephalopathy likely secondary to severe hyponatremia. She carries the psychiatric diagnoses of paranoid schizophrenia, type 1 bipolar disorder and has a past medical history of CKD stage 2, systolic heart failure, pressure injuries.Psychiatry was consulted for psychotropic medication adjustments in setting of severe hyponatremia by Salvadore Dom, NP.    She meets criteria for paranoid schizophrenia and delusional disorder based on guardian/nieces collateral information.  Current outpatient psychotropic medications include Lexapro, risperidone, trazodone. Historically she has had a good response to these medications. She was compliant with medications prior to admission as evidenced by being given these at her assisted living facility. On initial examination, patient does not appear to be responding to internal stimuli nor endorsing paranoia/psychosis. Please see plan below for detailed recommendations.   Diagnoses:  Active Hospital problems: Principal Problem:   Hyponatremia Active Problems:   Paranoid schizophrenia (HCC)   Severe manic bipolar 1 disorder with psychotic behavior (HCC)   Pressure injury of skin   SIADH (syndrome of inappropriate ADH production) (HCC)   Systolic heart failure (HCC)   CKD (chronic kidney disease), stage II     Plan  ## Safety and Observation Level:  - Based on my clinical evaluation, I estimate the patient to be at minimal risk of self harm in the current setting  ## Medications:  -- Continue risperidone 1 mg daily for paranoid schizophrenia  -Given her 30+ year history of being on risperidone and relative psychiatric stability for the past year, continuing the risperidone outweighs the risk of a contributing to SIADH.  -Qtc 516 but  risk of decompensation of paranoid schizophrenia outweighs risk -- Plan to start mirtazapine tomorrow night presuming Na normalizes. Need to balance risk for SIADH (beers criteria) vs decompensation of schizophrenia. -- We will start [place] tomorrow for TD -- Discontinue Lexapro and trazodone given possibility they are contributing to SIADH and recommend resuming these medications in outpatient setting  ## Medical Decision Making Capacity:  Did not formally assess  ## Further Work-up:  -- per primary team -- most recent EKG on 10/9 had QtC of 516 -- Pertinent labwork reviewed earlier this admission includes: Na 123 (on admission 110), BNP 1425.3>1148.7.  ## Disposition:  -- ALF once medically stable  ##Legal Status Patient has guardian (heather chambers, niece)  Thank you for this consult request. Recommendations have been communicated to the primary team.  We will continue to follow at this time.   France Ravens, MD   Follow Up  Relevant Aspects of Hospital Course:  Admitted on 01/14/2022 for severe hypoosmolar hyponatremia likely secondary to SIADH.  Patient Report:  Patient reports doing well. Reports mild anxiety due to sputum production. Denies SI/HI/AVH. No acute complaints at this time. Discussed mirtazapine and valbenazine for depression and TD respectively. Patient wants to be started on remeron first as she is less bothered by lipsmacking than remeron. Discussed risks including decrease in sodium and worsening of SIADH. Plan to start both tomorrow.   Collateral information:  Per chart review, AM Sodium downtrended from 127 to 125 so hypertonic saline was resumed but was discontinued after going up to 128. Patient being transferred from ICU to progressive  Psychiatric History:  Diagnosis of schizophrenia since age of 41, significant EPS with haldol, multiple psychiatric hospitalizations and IVC. No substance use  history of note.  Social History:  Tobacco use:  denies Alcohol use: denies Drug use: denies  Family History:  The patient's family history includes COPD in her father; Colon polyps in an other family member; Diabetes in her paternal aunt and paternal grandmother; Heart attack in her father; Heart failure in her mother; Hyperlipidemia in her mother; Hypertension in her father and mother; Stroke in her mother.  Medical History: Past Medical History:  Diagnosis Date   Anemia 1990   hx of   Anxiety    Arthritis 2004   Asthma 1995   uses inhaler   Chronic kidney disease 2010   LEFT kidney removed   Colon polyp    Complication of anesthesia 1966   first surgery she had- had a hard time waking up from surgery   Delusional disorder ([place])    Six delusional disorder   Diverticulosis 2003   GERD (gastroesophageal reflux disease)    on meds   Schizophrenia [facility])     Surgical History: Past Surgical History:  Procedure Laterality Date   APPENDECTOMY  1966   CATARACT EXTRACTION     Bilateral   CHOLECYSTECTOMY  2004   COLON SURGERY  2008   COLONOSCOPY  2013   JP-MAC-moviprep(good)-TA   COLOSTOMY     COLOSTOMY TAKEDOWN     DIAGNOSTIC LAPAROSCOPY     exploratory for endometriosis   HEEL SPUR SURGERY     Right, Metal Plate in Heel   [place]  2010   NEPHRECTOMY Left 0315   As complication of partial colectomy for diverticulitis   POLYPECTOMY  2013   TA   TONSILLECTOMY     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   Procedure: LEFT TOTAL KNEE ARTHROPLASTY;  Surgeon: Susa Day, MD;  Location: [facility];  Service: Orthopedics;  Laterality: Left;  Adductor Block    Medications:   Current Facility-Administered Medications:    0.9 %  sodium chloride infusion, , Intravenous, Continuous, Sood, Vineet, MD, Last Rate: 20 mL/hr at 01/18/22 0915, New Bag at 01/18/22 0915   acetaminophen (TYLENOL) tablet 650 mg, 650 mg, Oral, Q6H PRN, Erick Colace, NP   albuterol (PROVENTIL) (2.5 MG/3ML) 0.083% nebulizer solution 2.5  mg, 2.5 mg, Nebulization, Q4H PRN, Halford Chessman, Vineet, MD   amantadine (SYMMETREL) capsule 100 mg, 100 mg, Oral, BID, Salvadore Dom E, NP, 100 mg at 01/18/22 0913   Chlorhexidine Gluconate Cloth 2 % PADS 6 each, 6 each, Topical, Daily, Erick Colace, NP, 6 each at 01/18/22 0914   docusate sodium (COLACE) capsule 100 mg, 100 mg, Oral, BID PRN, Erick Colace, NP   fluticasone (FLONASE) 50 MCG/ACT nasal spray 2 spray, 2 spray, Each Nare, Daily, Erick Colace, NP, 2 spray at 01/18/22 0914   gabapentin (NEURONTIN) capsule 100 mg, 100 mg, Oral, QHS, Erick Colace, NP, 100 mg at 01/17/22 2126   heparin injection 5,000 Units, 5,000 Units, Subcutaneous, Q8H, Erick Colace, NP, 5,000 Units at 01/18/22 0544   Oral care mouth rinse, 15 mL, Mouth Rinse, 4 times per day, Chesley Mires, MD   Oral care mouth rinse, 15 mL, Mouth Rinse, PRN, Halford Chessman, Vineet, MD   polyethylene glycol (MIRALAX / GLYCOLAX) packet 17 g, 17 g, Oral, Daily PRN, Erick Colace, NP   risperiDONE (RISPERDAL) tablet 1 mg, 1 mg, Oral, QHS, Salvadore Dom E, NP, 1 mg at 01/17/22 2126   sodium chloride flush (NS) 0.9 % injection 10-40 mL, 10-40 mL, Intracatheter, Q12H, Chesley Mires, MD, 10  mL at 01/18/22 0914   sodium chloride flush (NS) 0.9 % injection 10-40 mL, 10-40 mL, Intracatheter, PRN, Chesley Mires, MD   umeclidinium-vilanterol (ANORO ELLIPTA) 62.5-25 MCG/ACT 1 puff, 1 puff, Inhalation, Daily, Chesley Mires, MD  Allergies: Allergies  Allergen Reactions   Haloperidol Anaphylaxis   Aripiprazole Other (See Comments)    Bad thoughts    Chlorpheniramine-Pse-Ibuprofen     Other reaction(s): Cough       Objective  Vital signs:  Temp:  [98.1 F (36.7 C)-98.6 F (37 C)] 98.4 F (36.9 C) (10/12 0800) Pulse Rate:  [68-92] 79 (10/12 0800) Resp:  [11-25] 19 (10/12 0800) BP: (103-122)/(49-72) 119/60 (10/12 0800) SpO2:  [87 %-100 %] 100 % (10/12 0800) FiO2 (%):  [32 %] 32 % (10/12 0737) Weight:  [82.3 kg] 82.3 kg (10/12  0500)  Psychiatric Specialty Exam:  Presentation  General Appearance: Appropriate for Environment; Casual  Eye Contact:Good  Speech:Clear and Coherent; Normal Rate  Speech Volume:Normal  Handedness:No data recorded  Mood and Affect  Mood:Anxious  Affect:Flat   Thought Process  Thought Processes:Coherent; Goal Directed  Descriptions of Associations:Intact  Orientation:Full (Time, Place and Person)  Thought Content:Logical  History of Schizophrenia/Schizoaffective disorder:No data recorded Duration of Psychotic Symptoms:No data recorded Hallucinations:Hallucinations: None  Ideas of Reference:None  Suicidal Thoughts:Suicidal Thoughts: No  Homicidal Thoughts:Homicidal Thoughts: No   Sensorium  Memory:Immediate Good; Recent Poor; Remote Poor  Judgment:Impaired  Insight:Fair   Executive Functions  Concentration:Fair  Attention Span:Fair  [place]   Psychomotor Activity  Psychomotor Activity:Psychomotor Activity: Normal   Assets  Assets:Communication Skills; Housing; Social Support   Sleep  Sleep:Sleep: Fair    Physical Exam: Physical Exam ROS Blood pressure 119/60, pulse 79, temperature 98.4 F (36.9 C), resp. rate 19, height _0  (1.575 m), weight 82.3 kg, SpO2 100 %. Body mass index is 33.19 kg/m.

## 2022-01-18 NOTE — Plan of Care (Signed)

## 2022-01-18 NOTE — Progress Notes (Signed)
[facility] Progress Note Patient Name: [name] DOB: [birth year] MRN: [id number]   Date of Service  01/18/2022  HPI/Events of Note  Hyponatremia - Na+ = 127 --> 125 with 3% NaCl held.   eICU Interventions  Plan: Restart 3% NaCl at 10 mL/hour. Continue to trend Na+ Q 4 hours.      Intervention Category Major Interventions: Electrolyte abnormality - evaluation and management  Delano Scardino [place] 01/18/2022, 3:31 AM

## 2022-01-18 NOTE — Evaluation (Signed)
Physical Therapy Evaluation Patient Details Name: [name] MRN: [id number] DOB: 1940/10/13 Today's Date: 01/18/2022  History of Present Illness  81 yo female admitted 01/15/22 with AMS, weakness, encephalopathy with severe hyponatremia. PMHx: paraonoid schizophrenia, bipolar disorder, CKD, CHF, solitary Rt kidney, Lt TKA   Clinical Impression  Pt presents with an overall decrease in functional mobility secondary to above. PTA, pt reported ambulating independent without assistive device, requiring assistance for self care and IADLs for cooking/cleaning. Pt responded AOx4 but uncertain if reliable narrator due to recent AMS. Trial of mobility without O2 [place], ultimately required 2L [place] to keep sats >89%.Pt would benefit from continued acute PT services to maximize functional mobility and independence prior to d/c to SNF (pt most likely to refuse and would require supervision level to return with [facility] PT).      Recommendations for follow up therapy are one component of a multi-disciplinary discharge planning process, led by the attending physician.  Recommendations may be updated based on patient status, additional functional criteria and insurance authorization.  Follow Up Recommendations Skilled nursing-short term rehab (<3 hours/day)      Assistance Recommended at Discharge Intermittent Supervision/Assistance  Patient can return home with the following  A little help with walking and/or transfers;A little help with bathing/dressing/bathroom;Help with stairs or ramp for entrance;Assist for transportation;Assistance with cooking/housework    Equipment Recommendations Rolling walker (2 wheels)  Recommendations for Other Services       Functional Status Assessment Patient has had a recent decline in their functional status and demonstrates the ability to make significant improvements in function in a reasonable and predictable amount of time.     Precautions / Restrictions  Precautions Precautions: Fall Restrictions Weight Bearing Restrictions: No      Mobility  Bed Mobility Overal bed mobility: Needs Assistance Bed Mobility: Supine to Sit     Supine to sit: Min assist     General bed mobility comments: min A for trunk support and scooting EOB    Transfers Overall transfer level: Needs assistance Equipment used: Rolling walker (2 wheels) Transfers: Sit to/from Stand, Bed to chair/wheelchair/BSC Sit to Stand: Min assist   Step pivot transfers: Min guard       General transfer comment: two sit to stand attempts with min A for full upright positioning    Ambulation/Gait Ambulation/Gait assistance: Min guard Gait Distance (Feet): 120 Feet Assistive device: Rolling walker (2 wheels) Gait Pattern/deviations: Shuffle Gait velocity: decreased     General Gait Details: pt with slow shuffly gait, pt reports abnormal from baseline, although does not have to ambulate far to dining hall  Stairs            Wheelchair Mobility    Modified Rankin (Stroke Patients Only)       Balance Overall balance assessment: Needs assistance Sitting-balance support: Feet supported, Bilateral upper extremity supported Sitting balance-Leahy Scale: Fair Sitting balance - Comments: pt not reliant on UE support but prefers it for steadying due to fear of falling   Standing balance support: Bilateral upper extremity supported, During functional activity, Reliant on assistive device for balance Standing balance-Leahy Scale: Poor Standing balance comment: reliant on RW for upright static balance                             Pertinent Vitals/Pain Pain Assessment Pain Assessment: Faces Faces Pain Scale: Hurts a little bit Pain Location: heel of foot Pain Descriptors / Indicators: Aching Pain Intervention(s):  Monitored during session, Repositioned    Home Living Family/patient expects to be discharged to:: Assisted living Living  Arrangements: Alone;Other (Comment) (pt states has roommate at [facility]) Available Help at Discharge: Available PRN/intermittently;Other (Comment) [facility] Staff) Type of Home: Apartment         Home Layout: One level Home Equipment: None Additional Comments: [facility] ALF    Prior Function Prior Level of Function : Needs assist       Physical Assist : ADLs (physical)   ADLs (physical): Grooming;Bathing;Dressing;IADLs Mobility Comments: patient reports she would walk to the dining hall for meals ADLs Comments: staff prepares meals, assist with bathing     Hand Dominance        Extremity/Trunk Assessment   Upper Extremity Assessment Upper Extremity Assessment: Generalized weakness (functional UE strength to grip RW and open juice container)    Lower Extremity Assessment Lower Extremity Assessment: Generalized weakness    Cervical / Trunk Assessment Cervical / Trunk Assessment: Normal  Communication   Communication: No difficulties  Cognition Arousal/Alertness: Awake/alert Behavior During Therapy: WFL for tasks assessed/performed Overall Cognitive Status: Impaired/Different from baseline Area of Impairment: Attention, Memory, Safety/judgement, Awareness                   Current Attention Level: Sustained Memory: Decreased short-term memory   Safety/Judgement: Decreased awareness of deficits Awareness: Emergent   General Comments: pt hyperfocused on when lunch would arrive, able to recite situation for hospital admission        General Comments General comments (skin integrity, edema, etc.): Watch O2 sats -- started 3L [place], trialed RA with desat to 87%, titrated 2L [place] with readings >89%    Exercises     Assessment/Plan    PT Assessment Patient needs continued PT services  PT Problem List Decreased strength;Decreased range of motion;Decreased activity tolerance;Decreased balance;Decreased mobility       PT Treatment Interventions DME  instruction;Balance training;Gait training;Functional mobility training;Patient/family education;Therapeutic activities;Therapeutic exercise    PT Goals (Current goals can be found in the Care Plan section)  Acute Rehab PT Goals Patient Stated Goal: to go back to ALF PT Goal Formulation: With patient Time For Goal Achievement: 02/01/22 Potential to Achieve Goals: Good    Frequency Min 3X/week     Co-evaluation               AM-PAC PT "6 Clicks" Mobility  Outcome Measure Help needed turning from your back to your side while in a flat bed without using bedrails?: A Little Help needed moving from lying on your back to sitting on the side of a flat bed without using bedrails?: A Little Help needed moving to and from a bed to a chair (including a wheelchair)?: A Little Help needed standing up from a chair using your arms (e.g., wheelchair or bedside chair)?: A Little Help needed to walk in hospital room?: A Little Help needed climbing 3-5 steps with a railing? : A Lot 6 Click Score: 17    End of Session Equipment Utilized During Treatment: Gait belt;Oxygen Activity Tolerance: Patient tolerated treatment well Patient left: in chair;with call bell/phone within reach;with chair alarm set Nurse Communication: Mobility status;Other (comment) (O2 status) PT Visit Diagnosis: Other abnormalities of gait and mobility (R26.89);Muscle weakness (generalized) (M62.81);Unsteadiness on feet (R26.81)    Time: 9562-1308 PT Time Calculation (min) (ACUTE ONLY): 41 min   Charges:   PT Evaluation $PT Eval Moderate Complexity: 1 Mod PT Treatments $Therapeutic Activity: 8-22 mins  [name], SPT   Thi Sisemore 01/18/2022, 1:30 PM

## 2022-01-18 NOTE — Progress Notes (Signed)
NAME:  [name], MRN:  [id number], DOB:  September 22, 1940, LOS: 3 ADMISSION DATE:  01/14/2022, CONSULTATION DATE:  10/9 REFERRING MD:  Christy Gentles, CHIEF COMPLAINT:  hyponatremia    History of Present Illness:  81 year old female patient who presented to [facility] from assisted care.  Presented with altered mental status, following about 2 weeks of progressive weakness.  In the emergency department was found to be short of breath, requiring supplemental oxygen, sodium was 111, BUN 10, creatinine 0.62 CT of head was negative chest x-ray demonstrating mild pulmonary edema.  Because of her symptomatic hyponatremia she was transferred to [facility] for treatment  Pertinent  Medical History  Systolic heart failure with a EF 20 to 25%, CKD stage II, GERD, hypertension, anxiety, diverticulosis. Resides at assisted living typically ambulatory  Significant Hospital Events: Including procedures, antibiotic start and stop dates in addition to other pertinent events   10/9 presented from assisted living with altered mental status, and approximately 2 weeks of progressive weakness.  Working diagnosis decompensated heart failure with symptomatic hyponatremia, urine studies sent, administered 3% sodium on arrival due to symptomatic hyponatremia.  Urine Osmo 605, received 2 doses of 3% saline.  IV diuresis 10/10 sodium up to 115 from its lowest point of 110.  Mental status improved.  Cortisol and TSH normal.  Blood pressure borderline following 3 doses of diuresis.  Evaluation suggesting at least some component of SIADH.  SSRI stopped, had already been holding trazodone.  Had episode of choking, SLP consulted 10/11 more awake, oriented.  Able to say she is here because of hyponatremia no distress. 10/12 d/c 3% saline, transfer to progressive care  Interim History / Subjective:  Slept well.  Coughing spells with neb treatments.  Otherwise feels better.  Objective   Blood pressure 117/65, pulse 78,  temperature 98.2 F (36.8 C), resp. rate 18, height '5\' 2"'$  (1.575 m), weight 82.3 kg, SpO2 97 %. CVP:  [12 mmHg-14 mmHg] 12 mmHg  FiO2 (%):  [32 %] 32 %   Intake/Output Summary (Last 24 hours) at 01/18/2022 0752 Last data filed at 01/18/2022 0600 Gross per 24 hour  Intake 1181.68 ml  Output 5155 ml  Net -3973.32 ml   Filed Weights   01/16/22 0444 01/17/22 0600 01/18/22 0500  Weight: 83.2 kg 85.4 kg 82.3 kg    Examination:  General - alert Eyes - pupils reactive ENT - no sinus tenderness, no stridor Cardiac - regular rate/rhythm, no murmur Chest - equal breath sounds b/l, no wheezing or rales Abdomen - soft, non tender, + bowel sounds Extremities - no cyanosis, clubbing, or edema Skin - no rashes Neuro - normal strength, moves extremities, follows commands Psych - normal mood and behavior  Resolved Hospital Problem list   Acute metabolic encephalopathy from hyponatremia, Acute pulmonary edema  Assessment & Plan:   Hyponatremia. - mixed picture of SIADH and CHF - lexapro and trazodone d/c'ed - d/c 3% saline - lasix 40 mg IV x one today - NS at 20 ml - f/u BMET  Acute on chronic systolic CHF. - continue negative fluid balance as tolerated  Dysphagia. - D3 diet - f/u with speech  CKD stage II - f/u BMET  History of COPD - transition to anoro  - prn albuterol  History of bipolar type I with psychotic behaviors, and extraparametal symptoms - seen by psychiatry on 10/11 - continue risperdal, symmetrel  Deconditioning. - PT/OT assessment  Transfer to progressive care.  Will ask [facility] to assume care  from 10/13 and PCCM off.   Best Practice (right click and "Reselect all SmartList Selections" daily)   Diet/type: dysphagia diet (see orders) DVT prophylaxis: prophylactic heparin  GI prophylaxis: N/A Lines: Central line Foley:  N/A Code Status:  full code Last date of multidisciplinary goals of care discussion [pending   Labs:      Latest Ref Rng &  Units 01/18/2022    6:40 AM 01/18/2022    1:22 AM 01/17/2022    9:33 PM  CMP  Glucose 70 - 99 mg/dL 139     BUN 8 - 23 mg/dL 6     Creatinine 0.44 - 1.00 mg/dL 0.80     Sodium 135 - 145 mmol/L 128  125  127   Potassium 3.5 - 5.1 mmol/L 3.8     Chloride 98 - 111 mmol/L 86     CO2 22 - 32 mmol/L 31     Calcium 8.9 - 10.3 mg/dL 8.4          Latest Ref Rng & Units 01/16/2022    4:29 AM 01/15/2022    1:05 AM 12/19/2021   12:53 PM  CBC  WBC 4.0 - 10.5 K/uL 5.8  7.6  6.8   Hemoglobin 12.0 - 15.0 g/dL 11.3  12.6  13.4   Hematocrit 36.0 - 46.0 % 33.0  35.4  38.9   Platelets 150 - 400 K/uL 201  214  250     Signature:  Chesley Mires, MD [place] Pager - [phone] 01/18/2022, 8:00 AM

## 2022-01-19 DIAGNOSIS — E871 Hypo-osmolality and hyponatremia: Secondary | ICD-10-CM | POA: Diagnosis not present

## 2022-01-19 LAB — CBC WITH DIFFERENTIAL/PLATELET
Abs Immature Granulocytes: 0.02 10*3/uL (ref 0.00–0.07)
Basophils Absolute: 0.1 10*3/uL (ref 0.0–0.1)
Basophils Relative: 1 %
Eosinophils Absolute: 0.2 10*3/uL (ref 0.0–0.5)
Eosinophils Relative: 3 %
HCT: 34.7 % — ABNORMAL LOW (ref 36.0–46.0)
Hemoglobin: 11.6 g/dL — ABNORMAL LOW (ref 12.0–15.0)
Immature Granulocytes: 0 %
Lymphocytes Relative: 13 %
Lymphs Abs: 0.8 10*3/uL (ref 0.7–4.0)
MCH: 30.7 pg (ref 26.0–34.0)
MCHC: 33.4 g/dL (ref 30.0–36.0)
MCV: 91.8 fL (ref 80.0–100.0)
Monocytes Absolute: 0.8 10*3/uL (ref 0.1–1.0)
Monocytes Relative: 14 %
Neutro Abs: 4.3 10*3/uL (ref 1.7–7.7)
Neutrophils Relative %: 69 %
Platelets: 158 10*3/uL (ref 150–400)
RBC: 3.78 MIL/uL — ABNORMAL LOW (ref 3.87–5.11)
RDW: 12.2 % (ref 11.5–15.5)
WBC: 6.1 10*3/uL (ref 4.0–10.5)
nRBC: 0 % (ref 0.0–0.2)

## 2022-01-19 LAB — OSMOLALITY, URINE: Osmolality, Ur: 271 mOsm/kg — ABNORMAL LOW (ref 300–900)

## 2022-01-19 LAB — BASIC METABOLIC PANEL
Anion gap: 7 (ref 5–15)
BUN: 9 mg/dL (ref 8–23)
CO2: 33 mmol/L — ABNORMAL HIGH (ref 22–32)
Calcium: 8.4 mg/dL — ABNORMAL LOW (ref 8.9–10.3)
Chloride: 87 mmol/L — ABNORMAL LOW (ref 98–111)
Creatinine, Ser: 0.68 mg/dL (ref 0.44–1.00)
GFR, Estimated: >60 mL/min (ref >60)
Glucose, Bld: 100 mg/dL — ABNORMAL HIGH (ref 70–99)
Potassium: 4.1 mmol/L (ref 3.5–5.1)
Sodium: 127 mmol/L — ABNORMAL LOW (ref 135–145)

## 2022-01-19 LAB — BRAIN NATRIURETIC PEPTIDE: B Natriuretic Peptide: 616.7 pg/mL — ABNORMAL HIGH (ref 0.0–100.0)

## 2022-01-19 LAB — URIC ACID: Uric Acid, Serum: 4 mg/dL (ref 2.5–7.1)

## 2022-01-19 LAB — CREATININE, URINE, RANDOM: Creatinine, Urine: 46 mg/dL

## 2022-01-19 LAB — SODIUM, URINE, RANDOM: Sodium, Ur: 48 mmol/L

## 2022-01-19 LAB — MAGNESIUM: Magnesium: 1.6 mg/dL — ABNORMAL LOW (ref 1.7–2.4)

## 2022-01-19 MED ORDER — PANTOPRAZOLE SODIUM 40 MG PO TBEC
40.0000 mg | DELAYED_RELEASE_TABLET | Freq: Every day | ORAL | Status: DC
  Administered 2022-01-19 – 2022-01-23 (×5): 40 mg via ORAL
  Filled 2022-01-19 (×5): qty 1

## 2022-01-19 MED ORDER — MELATONIN 3 MG PO TABS
3.0000 mg | ORAL_TABLET | Freq: Every evening | ORAL | Status: DC | PRN
  Administered 2022-01-22: 3 mg via ORAL
  Filled 2022-01-19: qty 1

## 2022-01-19 MED ORDER — MELATONIN 3 MG PO TABS
3.0000 mg | ORAL_TABLET | Freq: Once | ORAL | Status: AC
  Administered 2022-01-19: 3 mg via ORAL
  Filled 2022-01-19: qty 1

## 2022-01-19 MED ORDER — FUROSEMIDE 10 MG/ML IJ SOLN
40.0000 mg | Freq: Once | INTRAMUSCULAR | Status: AC
  Administered 2022-01-19: 40 mg via INTRAVENOUS
  Filled 2022-01-19: qty 4

## 2022-01-19 MED ORDER — ALBUTEROL SULFATE HFA 108 (90 BASE) MCG/ACT IN AERS: 2.0000 | INHALATION_SPRAY | Freq: Four times a day (QID) | RESPIRATORY_TRACT | Status: DC | PRN

## 2022-01-19 MED ORDER — MIRTAZAPINE 15 MG PO TABS
7.5000 mg | ORAL_TABLET | Freq: Every day | ORAL | Status: DC
  Administered 2022-01-19 – 2022-01-22 (×4): 7.5 mg via ORAL
  Filled 2022-01-19 (×4): qty 1

## 2022-01-19 MED ORDER — VITAMIN B-12 1000 MCG PO TABS
1000.0000 ug | ORAL_TABLET | Freq: Every day | ORAL | Status: DC
  Administered 2022-01-19 – 2022-01-23 (×5): 1000 ug via ORAL
  Filled 2022-01-19 (×5): qty 1

## 2022-01-19 MED ORDER — MAGNESIUM SULFATE 4 GM/100ML IV SOLN
4.0000 g | Freq: Once | INTRAVENOUS | Status: AC
  Administered 2022-01-19: 4 g via INTRAVENOUS
  Filled 2022-01-19: qty 100

## 2022-01-19 MED ORDER — VALBENAZINE TOSYLATE 40 MG PO CAPS
40.0000 mg | ORAL_CAPSULE | Freq: Every day | ORAL | Status: DC
  Administered 2022-01-20 – 2022-01-23 (×4): 40 mg via ORAL
  Filled 2022-01-19 (×5): qty 1

## 2022-01-19 NOTE — Care Management Important Message (Signed)
Important Message  Patient Details  Name: [name] MRN: [id number] Date of Birth: [birth year]   Medicare Important Message Given:  Yes     Orbie Pyo 01/19/2022, 3:18 PM

## 2022-01-19 NOTE — Progress Notes (Signed)
Per chart review, patient reside at [facility] in [place]. CSW spoke with Cristie Hem and she reported they will need an FL2 and DC Summary for return (F. [phone]). Patient does not normally have oxygen so she will check to see if they use a specific provider if patient requires it at discharge. They use [facility] for home health services.   Gilmore Laroche, MSW, [facility]

## 2022-01-19 NOTE — Progress Notes (Signed)
PROGRESS NOTE                                                                                                                                                                                                             Patient Demographics:    [name], is a 81 y.o. female, DOB - 04-23-1940, YKD:[id number]  Outpatient Primary MD for the patient is Lubertha Sayres, FNP    LOS - 4  Admit date - 01/14/2022    Chief Complaint  Patient presents with   Urinary Tract Infection       Brief Narrative (HPI from H&P)   81 year old female patient with known past medical history of chronic systolic heart failure EF 25%, CKD stage II, GERD, hypertension, anxiety, diverticulosis, schizophrenia who lives at an assisted living facility who presented to [facility] from assisted care.  Presented with altered mental status, following about 2 weeks of progressive weakness.  In the emergency department was found to be short of breath, requiring supplemental oxygen, sodium was 111, BUN 10, creatinine 0.62 CT of head was negative chest x-ray demonstrating mild pulmonary edema.  Because of her symptomatic hyponatremia she was transferred to [facility] for treatment.  Admitted by PCCM required 3% normal saline administration, her sodium levels have improved since mentation has improved and she was transferred to hospitalist service on 01/19/2022.   Subjective:    [name] today has, No headache, No chest pain, No abdominal pain - No Nausea, No new weakness tingling or numbness, no SOB.   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  She has been diuresed also required 3% normal saline in ICU, sodium levels have improved, mentation is close to baseline, no headache or focal deficits.  Continue fluid restriction and gentle diuresis  and monitor.  Acute on chronic systolic CHF.  EF known to be around 20%.  Blood pressure low, not on ACE/ARB or beta-blocker due to hypotension, diuretics as tolerated, continue to monitor.  HX of bipolar disorder with psychotic behavior, now diagnosed with schizophrenia and delusional disorder per psych.  Psych monitoring and managing medications.  Dysphagia.  Speech following dysphagia 3 diet.  COPD.  No wheezing.  Supportive care.  Try to titrate off oxygen.  Weakness and deconditioning.  PT OT.  May require SNF.  Hypomagnesemia.  Replaced.  Question underlying CKD stage II.  Currently creatinine within normal limits.      Condition - Fair  Family Communication  :  None present  Code Status :  Full  Consults  :  PCCM  PUD Prophylaxis :    Procedures  :     Right arm PICC line placed 01/15/2022        Disposition Plan  :    Status is: Inpatient  DVT Prophylaxis  :    heparin injection 5,000 Units Start: 01/15/22 1400   Lab Results  Component Value Date   PLT 158 01/19/2022    Diet :  Diet Order             DIET DYS 3 Room service appropriate? Yes; Fluid consistency: Thin; Fluid restriction: 1200 mL Fluid  Diet effective now                    Inpatient Medications  Scheduled Meds:  amantadine  100 mg Oral BID   Chlorhexidine Gluconate Cloth  6 each Topical Daily   fluticasone  2 spray Each Nare Daily   gabapentin  100 mg Oral QHS   heparin  5,000 Units Subcutaneous Q8H   mouth rinse  15 mL Mouth Rinse 4 times per day   risperiDONE  1 mg Oral QHS   umeclidinium-vilanterol  1 puff Inhalation Daily   Continuous Infusions:  magnesium sulfate bolus IVPB     PRN Meds:.acetaminophen, albuterol, docusate sodium, mouth rinse, polyethylene glycol, sodium chloride flush  Antibiotics  :    Anti-infectives (From admission, onward)    Start     Dose/Rate Route Frequency Ordered Stop   01/15/22 0230  cefTRIAXone (ROCEPHIN) 1 g in sodium  chloride 0.9 % 100 mL IVPB        1 g 200 mL/hr over 30 Minutes Intravenous  Once 01/15/22 0221 01/15/22 0309         Objective:   Vitals:   01/19/22 0300 01/19/22 0400 01/19/22 0456 01/19/22 0800  BP:  123/69  (!) 108/54  Pulse: 96 (!) 115  95  Resp: _0 Temp:  97.8 F (36.6 C)  97.9 F (36.6 C)  TempSrc:  Oral  Oral  SpO2: 97% 96%  98%  Weight:   84.1 kg   Height:        Wt Readings from Last 3 Encounters:  01/19/22 84.1 kg  01/09/22 83.6 kg  12/19/21 81.2 kg     Intake/Output Summary (Last 24 hours) at 01/19/2022 1043 Last data filed at 01/19/2022 0426 Gross per 24 hour  Intake 666.85 ml  Output 2940 ml  Net -2273.15 ml     Physical Exam  Awake Alert, No new F.N deficits, right arm PICC line, Foley catheter Howard.AT,PERRAL Supple Neck, No JVD,   Symmetrical Chest wall movement, Good air movement bilaterally, CTAB RRR,No Gallops,Rubs or new Murmurs,  +ve B.Sounds, Abd Soft, No tenderness,   No Cyanosis, Clubbing or edema     RN pressure injury documentation: Pressure Injury 01/15/22 Sacrum Medial Stage 1 -  Intact skin with non-blanchable redness of a localized area usually over a bony prominence. (Active)  01/15/22 1302  Location: Sacrum  Location Orientation: Medial  Staging: Stage 1 -  Intact skin with non-blanchable redness of a localized area usually over a bony prominence.  Wound Description (Comments):   Present on Admission:  Yes  Dressing Type Foam - Lift dressing to assess site every shift 01/19/22 0800      Data Review:    CBC Recent Labs  Lab 01/15/22 0105 01/16/22 0429 01/19/22 0519  WBC 7.6 5.8 6.1  HGB 12.6 11.3* 11.6*  HCT 35.4* 33.0* 34.7*  PLT 214 201 158  MCV 86.6 88.9 91.8  MCH 30.8 30.5 30.7  MCHC 35.6 34.2 33.4  RDW 11.9 12.0 12.2  LYMPHSABS 0.5*  --  0.8  MONOABS 0.8  --  0.8  EOSABS 0.0  --  0.2  BASOSABS 0.0  --  0.1    Electrolytes Recent Labs  Lab 01/15/22 0105 01/15/22 0153 01/15/22 0155  01/15/22 0155 01/15/22 0301 01/15/22 1038 01/15/22 1525 01/16/22 0429 01/16/22 0556 01/17/22 0544 01/17/22 1242 01/17/22 2133 01/18/22 0122 01/18/22 0640 01/18/22 1706 01/19/22 0519 01/19/22 0639  NA  --   --  110*   < > 111* 112*   < > 118*   < > 122*   < > 127* 125* 128* 125* 127*  --   K  --   --  4.7   < > 4.5 4.7   < > 4.1  --  4.3  --   --   --  3.8 4.2 4.1  --   CL  --   --  79*   < > 77* 78*   < > 80*  --  86*  --   --   --  86* 84* 87*  --   CO2  --   --  22   < > 24 24   < > 28  --  29  --   --   --  31 34* 33*  --   GLUCOSE  --   --  118*   < > 118* 107*   < > 85  --  99  --   --   --  139* 112* 100*  --   BUN  --   --  10   < > 10 8   < > 9  --  10  --   --   --  6* 10 9  --   CREATININE  --   --  0.62   < > 0.65 0.70   < > 0.81  --  0.80  --   --   --  0.80 0.75 0.68  --   CALCIUM  --   --  8.1*   < > 8.2* 8.5*   < > 7.8*  --  7.9*  --   --   --  8.4* 8.2* 8.4*  --   AST  --   --  22  --  24 25  --   --   --   --   --   --   --   --   --   --   --   ALT  --   --  23  --  25 24  --   --   --   --   --   --   --   --   --   --   --   ALKPHOS  --   --  99  --  105 95  --   --   --   --   --   --   --   --   --   --   --   BILITOT  --   --  1.0  --  1.0 0.9  --   --   --   --   --   --   --   --   --   --   --   ALBUMIN  --   --  3.4*  --  3.7 3.4*  --   --   --   --   --   --   --   --   --   --   --   MG  --   --   --   --   --   --   --   --   --   --   --   --   --   --   --   --  1.6*  PROCALCITON  --   --   --   --   --  <0.10  --   --   --   --   --   --   --   --   --   --   --   LATICACIDVEN  --  0.8  --   --   --   --   --   --   --   --   --   --   --   --   --   --   --   TSH  --   --   --   --   --  1.121  --   --   --   --   --   --   --   --   --   --   --   AMMONIA  --  17  --   --   --   --   --   --   --   --   --   --   --   --   --   --   --   BNP 1,173.0*  --   --   --   --  1,425.3*  --  1,148.7*  --   --   --   --   --   --   --   --  616.7*   < > =  values in this interval not displayed.    Radiology Reports DG Swallowing Func-Speech Pathology  Result Date: 01/17/2022 Table formatting from the original result was not included. Objective Swallowing Evaluation: Type of Study: MBS-Modified Barium Swallow Study  Patient Details Name: [name] MRN: [id number] Date of Birth: 07/10/1940 Today's Date: 01/17/2022 Time: SLP Start Time (ACUTE ONLY): 1340 -SLP Stop Time (ACUTE ONLY): 1400 SLP Time Calculation (min) (ACUTE ONLY): 20 min Past Medical History: Past Medical History: Diagnosis Date  Anemia 1990  hx of  Anxiety   Arthritis 2004  Asthma 1995  uses inhaler  Chronic kidney disease 2010  LEFT kidney removed  Colon polyp   Complication of anesthesia 1966  first surgery she had- had a hard time waking up from surgery  Delusional disorder ([employer])   Six delusional disorder  Diverticulosis 2003  GERD (gastroesophageal reflux disease)   on meds  Schizophrenia [place] [facility])  Past Surgical History: Past Surgical History: Procedure Laterality Date  APPENDECTOMY  1966  CATARACT EXTRACTION    Bilateral  CHOLECYSTECTOMY  2004  COLON SURGERY  2008  COLONOSCOPY  2013  JP-MAC-moviprep(good)-TA  COLOSTOMY    COLOSTOMY TAKEDOWN    DIAGNOSTIC LAPAROSCOPY    exploratory  for endometriosis  HEEL SPUR SURGERY    Right, Metal Plate in Heel  [place]  2010  NEPHRECTOMY Left 8299  As complication of partial colectomy for diverticulitis  POLYPECTOMY  2013  TA  TONSILLECTOMY    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  Procedure: LEFT TOTAL KNEE ARTHROPLASTY;  Surgeon: Susa Day, MD;  Location: [facility];  Service: Orthopedics;  Laterality: Left;  Adductor Block HPI: Patient is a 81  y.o. female with PMH: HFrEF, GERD, HTN, anxiety, schizophrenia, CKD stage II presented to [facility] from her ALF with AMS.  Found to have severe hyponatremia and pulmonary/peripheral edema.  Transferred to [facility] on 01/15/22 for further management. On 10/10 RN observed patient to have a choking episode  while eating lunch meal during which patient became blue in the face, had desaturation of oxygen into 70's with cyanosis. MD changed patient to NPO awaiting SLP to evaluate swallow function.  Subjective: pleasant, alert  Recommendations for follow up therapy are one component of a multi-disciplinary discharge planning process, led by the attending physician.  Recommendations may be updated based on patient status, additional functional criteria and insurance authorization. Assessment / Plan / Recommendation   01/17/2022   5:21 PM Clinical Impressions Clinical Impression Patient presents with a mild pharyngeal phase dysphagia but with oral phase of swallow that appears [facility]. During pharyngeal phase, patient exhibited swallow intiation delays at level of vallecular sinus with puree solids nectar thick liquids and swallow initiation delays to level of pyriform with thin liquids (intermittently with nectar thick liquids). Trace, silent aspiration occured with thin liquids during the swallow when patient taking large sips and tilting head backwards. When patient kept head in neutral position and took small to average sized sips, no aspiration observed. Only trace vallecular and pyriform sinus residuals observed with thin liquids however subsequent swallows cleared residuals fully. Barium tablet taken with puree solids and soft solid textures both transited pharyngeally and through cervical esophagus without difficulty. SLP did not observe any swallow impairment during this MBS that would explain her recent choking event with solid texture PO's. Recommendation is for Dys 3 (mechanical soft)  solids and thin liquids. In addition, it is recommended that patient sit upright in bed or preferably, recliner chair during PO intake. SLP Visit Diagnosis Dysphagia, unspecified (R13.10) Impact on safety and function Mild aspiration risk     01/17/2022   5:21 PM Treatment Recommendations Treatment Recommendations Therapy as outlined  in treatment plan below     01/17/2022   5:28 PM Prognosis Prognosis for Safe Diet Advancement Good   01/17/2022   5:21 PM Diet Recommendations SLP Diet Recommendations Dysphagia 3 (Mech soft) solids;Thin liquid Liquid Administration via Straw;Cup Medication Administration Whole meds with puree Compensations Slow rate;Small sips/bites;Minimize environmental distractions Postural Changes Seated upright at 90 degrees     01/17/2022   5:21 PM Other Recommendations Oral Care Recommendations Oral care BID Follow Up Recommendations Follow physician's recommendations for discharge plan and follow up therapies Assistance recommended at discharge Frequent or constant Supervision/Assistance Functional Status Assessment Patient has had a recent decline in their functional status and demonstrates the ability to make significant improvements in function in a reasonable and predictable amount of time.   01/17/2022   5:21 PM Frequency and Duration  Speech Therapy Frequency (ACUTE ONLY) min 2x/week Treatment Duration 1 week     01/17/2022   5:18 PM Oral Phase Oral Phase [facility]    01/17/2022   5:18 PM Pharyngeal Phase Pharyngeal Phase Impaired Pharyngeal- Nectar  Cup Delayed swallow initiation-vallecula;Delayed swallow initiation-pyriform sinuses Pharyngeal- Thin Cup Delayed swallow initiation-pyriform sinuses;Reduced airway/laryngeal closure;Penetration/Aspiration during swallow Pharyngeal Material enters airway, passes BELOW cords without attempt by patient to eject out (silent aspiration) Pharyngeal- Thin Straw Delayed swallow initiation-pyriform sinuses;Penetration/Aspiration during swallow;Trace aspiration Pharyngeal Material enters airway, passes BELOW cords without attempt by patient to eject out (silent aspiration) Pharyngeal- Puree Delayed swallow initiation-vallecula Pharyngeal- Mechanical Soft WFL Pharyngeal- Pill [facility]    01/17/2022   5:20 PM Cervical Esophageal Phase  Cervical Esophageal Phase Impaired Cervical Esophageal  Comment prominent cricopharyngeal bar observed which did not appear to significantly impede bolus transit Sonia Baller, MA, CCC-SLP Speech Therapy                     DG Chest Port 1 View  Result Date: 01/17/2022 CLINICAL DATA:  64353 CHF (congestive heart failure) ([place]) 91225 EXAM: PORTABLE CHEST 1 VIEW COMPARISON:  January 15, 2022 FINDINGS: The cardiomediastinal silhouette is unchanged and enlarged in contour.RIGHT upper extremity PICC with tip terminating over the superior cavoatrial junction. Favored small bilateral pleural effusions. No pneumothorax. Perihilar vascular fullness, peribronchial cuffing and interstitial prominence, similar in comparison to prior. Visualized abdomen is unremarkable. IMPRESSION: 1.  Support apparatus as described above. 2. Pulmonary edema with small bilateral pleural effusions and scattered atelectasis. Electronically Signed   By: Valentino Saxon M.D.   On: 01/17/2022 07:46   [place] EKG SITE RITE  Result Date: 01/15/2022 If Site Rite image not attached, placement could not be confirmed due to current cardiac rhythm.     Signature  Lala Lund M.D on 01/19/2022 at 10:43 AM   -  To page go to [email or web address]

## 2022-01-19 NOTE — Progress Notes (Addendum)
[place] Progress Note Patient Name: [name] DOB: February 15, 1941 MRN: [id number]   Date of Service  01/19/2022  HPI/Events of Note  Patient requests sleep aid. Ordered Risperdal not effective.   eICU Interventions  Plan: Melatonin 3 mg PO X 1.     Intervention Category Major Interventions: Other:  Lysle Dingwall 01/19/2022, 12:49 AM

## 2022-01-19 NOTE — Consult Note (Signed)
[facility] New Face-to-Face Psychiatric Evaluation   Service Date: January 19, 2022 LOS:  LOS: 4 days    Assessment  [name] is a 81 y.o. female admitted medically for 01/14/2022 11:59 PM for encephalopathy likely secondary to severe hyponatremia. She carries the psychiatric diagnoses of paranoid schizophrenia, type 1 bipolar disorder and has a past medical history of CKD stage 2, systolic heart failure, pressure injuries.Psychiatry was consulted for psychotropic medication adjustments in setting of severe hyponatremia by Salvadore Dom, NP.    She meets criteria for paranoid schizophrenia and delusional disorder based on guardian/nieces collateral information.  Current outpatient psychotropic medications include Lexapro, risperidone, trazodone. Historically she has had a good response to these medications. She was compliant with medications prior to admission as evidenced by being given these at her assisted living facility. On initial examination, patient does not appear to be responding to internal stimuli nor endorsing paranoia/psychosis. Please see plan below for detailed recommendations.   Diagnoses:  Active Hospital problems: Principal Problem:   Hyponatremia Active Problems:   Paranoid schizophrenia (HCC)   Severe manic bipolar 1 disorder with psychotic behavior (HCC)   Pressure injury of skin   SIADH (syndrome of inappropriate ADH production) (HCC)   Systolic heart failure (HCC)   CKD (chronic kidney disease), stage II     Plan  ## Safety and Observation Level:  - Based on my clinical evaluation, I estimate the patient to be at minimal risk of self harm in the current setting  ## Medications:  -- Continue risperidone 1 mg daily for paranoid schizophrenia  -Given her 30+ year history of being on risperidone and relative psychiatric stability for the past year, continuing the risperidone outweighs the risk of a contributing to SIADH.  -Qtc 516 but  risk of decompensation of paranoid schizophrenia outweighs risk -- START mirtazapine 7.5 mg qhs. Need to balance risk for SIADH (beers criteria) vs decompensation of schizophrenia. -- START Ingrezza 40 mg daily for TD -- START melatonin 3 mg for refractory insomnia -- Minimize agents that may contribute to SIADH but need to balance with risk of psychotic decompensation  ## Medical Decision Making Capacity:  Did not formally assess  ## Further Work-up:  -- per primary team -- most recent EKG on 10/9 had QtC of 516 -- Pertinent labwork reviewed earlier this admission includes: Na 123 (on admission 110), BNP 1425.3>1148.7.  ## Disposition:  -- ALF once medically stable  ##Legal Status Patient has guardian (heather chambers, niece)  Thank you for this consult request. Recommendations have been communicated to the primary team.  We will continue to follow at this time.   France Ravens, MD   Follow Up  Relevant Aspects of Hospital Course:  Admitted on 01/14/2022 for severe hypoosmolar hyponatremia likely secondary to SIADH.  Patient Report:  Patient reports insomnia last night.  Patient reports that melatonin 3 mg did help with her ability sleep.  Patient was agreeable to starting Ingrezza for tardive dyskinesia and Remeron for anxiety with the added benefits of sedation.     Collateral information:  Insomnia last night per chart review. Received melatonin with good effect.  Psychiatric History:  Diagnosis of schizophrenia since age of 66, significant EPS with haldol, multiple psychiatric hospitalizations and IVC. No substance use history of note.  Social History:  Tobacco use: denies Alcohol use: denies Drug use: denies  Family History:  The patient's family history includes COPD in her father; Colon polyps in an other family member; Diabetes in  her paternal aunt and paternal grandmother; Heart attack in her father; Heart failure in her mother; Hyperlipidemia in her mother;  Hypertension in her father and mother; Stroke in her mother.  Medical History: Past Medical History:  Diagnosis Date   Anemia 1990   hx of   Anxiety    Arthritis 2004   Asthma 1995   uses inhaler   Chronic kidney disease 2010   LEFT kidney removed   Colon polyp    Complication of anesthesia 1966   first surgery she had- had a hard time waking up from surgery   Delusional disorder ([place])    Six delusional disorder   Diverticulosis 2003   GERD (gastroesophageal reflux disease)    on meds   Schizophrenia [facility])     Surgical History: Past Surgical History:  Procedure Laterality Date   APPENDECTOMY  1966   CATARACT EXTRACTION     Bilateral   CHOLECYSTECTOMY  2004   COLON SURGERY  2008   COLONOSCOPY  2013   JP-MAC-moviprep(good)-TA   COLOSTOMY     COLOSTOMY TAKEDOWN     DIAGNOSTIC LAPAROSCOPY     exploratory for endometriosis   HEEL SPUR SURGERY     Right, Metal Plate in Heel   [place]  2010   NEPHRECTOMY Left 1610   As complication of partial colectomy for diverticulitis   POLYPECTOMY  2013   TA   TONSILLECTOMY     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   Procedure: LEFT TOTAL KNEE ARTHROPLASTY;  Surgeon: Susa Day, MD;  Location: [facility];  Service: Orthopedics;  Laterality: Left;  Adductor Block    Medications:   Current Facility-Administered Medications:    acetaminophen (TYLENOL) tablet 650 mg, 650 mg, Oral, Q6H PRN, Halford Chessman, Vineet, MD   albuterol (PROVENTIL) (2.5 MG/3ML) 0.083% nebulizer solution 2.5 mg, 2.5 mg, Nebulization, Q4H PRN, Halford Chessman, Vineet, MD   amantadine (SYMMETREL) capsule 100 mg, 100 mg, Oral, BID, Halford Chessman, Vineet, MD, 100 mg at 01/19/22 0920   Chlorhexidine Gluconate Cloth 2 % PADS 6 each, 6 each, Topical, Daily, Chesley Mires, MD, 6 each at 01/19/22 9604   cyanocobalamin (VITAMIN B12) tablet 1,000 mcg, 1,000 mcg, Oral, Daily, Lala Lund K, MD, 1,000 mcg at 01/19/22 1215   docusate sodium (COLACE) capsule 100 mg, 100 mg, Oral,  BID PRN, Chesley Mires, MD   fluticasone (FLONASE) 50 MCG/ACT nasal spray 2 spray, 2 spray, Each Nare, Daily, Chesley Mires, MD, 2 spray at 01/19/22 0921   gabapentin (NEURONTIN) capsule 100 mg, 100 mg, Oral, QHS, Sood, Vineet, MD, 100 mg at 01/18/22 2210   heparin injection 5,000 Units, 5,000 Units, Subcutaneous, Q8H, Sood, Vineet, MD, 5,000 Units at 01/19/22 1336   melatonin tablet 3 mg, 3 mg, Oral, QHS PRN, France Ravens, MD   mirtazapine (REMERON) tablet 7.5 mg, 7.5 mg, Oral, QHS, France Ravens, MD   Oral care mouth rinse, 15 mL, Mouth Rinse, 4 times per day, Chesley Mires, MD, 15 mL at 01/19/22 1215   Oral care mouth rinse, 15 mL, Mouth Rinse, PRN, Halford Chessman, Vineet, MD   pantoprazole (PROTONIX) EC tablet 40 mg, 40 mg, Oral, Daily, Lala Lund K, MD, 40 mg at 01/19/22 1215   polyethylene glycol (MIRALAX / GLYCOLAX) packet 17 g, 17 g, Oral, Daily PRN, Chesley Mires, MD   risperiDONE (RISPERDAL) tablet 1 mg, 1 mg, Oral, QHS, Sood, Vineet, MD, 1 mg at 01/18/22 2210   sodium chloride flush (NS) 0.9 % injection 10-40 mL, 10-40 mL, Intracatheter, PRN, Sood, Vineet,  MD   umeclidinium-vilanterol (ANORO ELLIPTA) 62.5-25 MCG/ACT 1 puff, 1 puff, Inhalation, Daily, Sood, Vineet, MD   valbenazine [facility]) capsule 40 mg, 40 mg, Oral, Daily, France Ravens, MD  Allergies: Allergies  Allergen Reactions   Haloperidol Anaphylaxis   Aripiprazole Other (See Comments)    Bad thoughts    Chlorpheniramine-Pse-Ibuprofen     Other reaction(s): Cough       Objective  Vital signs:  Temp:  [97.7 F (36.5 C)-98.8 F (37.1 C)] 97.9 F (36.6 C) (10/13 0800) Pulse Rate:  [72-115] 80 (10/13 1622) Resp:  [9-20] 17 (10/13 1622) BP: (108-132)/(50-112) 119/60 (10/13 1622) SpO2:  [93 %-100 %] 96 % (10/13 1622) Weight:  [84.1 kg] 84.1 kg (10/13 0456)  Psychiatric Specialty Exam:  Presentation  General Appearance: Appropriate for Environment; Casual  Eye Contact:Good  Speech:Clear and Coherent; Normal Rate  Speech  Volume:Normal  Handedness:No data recorded  Mood and Affect  Mood:Anxious  Affect:Flat   Thought Process  Thought Processes:Coherent; Goal Directed  Descriptions of Associations:Intact  Orientation:Full (Time, Place and Person)  Thought Content:Logical  History of Schizophrenia/Schizoaffective disorder:No data recorded Duration of Psychotic Symptoms:No data recorded Hallucinations:Hallucinations: None  Ideas of Reference:None  Suicidal Thoughts:Suicidal Thoughts: No  Homicidal Thoughts:Homicidal Thoughts: No   Sensorium  Memory:Immediate Good; Recent Poor; Remote Poor  Judgment:Impaired  Insight:Fair   Executive Functions  Concentration:Fair  Attention Span:Fair  [place]   Psychomotor Activity  Psychomotor Activity:Psychomotor Activity: Normal   Assets  Assets:Communication Skills; Housing; Social Support   Sleep  Sleep:Sleep: Fair    Physical Exam: Physical Exam ROS Blood pressure 119/60, pulse 80, temperature 97.9 F (36.6 C), temperature source Oral, resp. rate 17, height _0  (1.575 m), weight 84.1 kg, SpO2 96 %. Body mass index is 33.91 kg/m.

## 2022-01-19 NOTE — Progress Notes (Signed)
PICC line removed per order, pressure held to achieve hemostasis, aftercare instructions reviewed.  Gauze and vaseline with tegaderm applied.

## 2022-01-19 NOTE — Evaluation (Addendum)
Occupational Therapy Evaluation Patient Details Name: [name] MRN: [id number] DOB: 1940-09-29 Today's Date: 01/19/2022   History of Present Illness 81 yo female admitted 01/15/22 with AMS, weakness, encephalopathy with severe hyponatremia. PMHx: paranoid schizophrenia, bipolar disorder, CKD, CHF, solitary Rt kidney, Lt TKA   Clinical Impression   Patient admitted for the diagnosis above.  PTA she lives at a local ALF and has a roommate.  Patient states staff assists with medications and showers.  She does not need an AD at baseline, but is using one currently in the acute setting, stating she is fearful of falling.  She is able to complete basic ADL with [employer].  OT will follow in the acute setting, and SNF can be considered, but if the ALF can provide increased assist, she could transition back to her ALF with [facility] OT.       Recommendations for follow up therapy are one component of a multi-disciplinary discharge planning process, led by the attending physician.  Recommendations may be updated based on patient status, additional functional criteria and insurance authorization.   Follow Up Recommendations  Skilled nursing-short term rehab (<3 hours/day)    Assistance Recommended at Discharge Intermittent Supervision/Assistance  Patient can return home with the following Assist for transportation;A little help with bathing/dressing/bathroom;A little help with walking and/or transfers;Direct supervision/assist for medications management;Assistance with cooking/housework    Functional Status Assessment  Patient has had a recent decline in their functional status and demonstrates the ability to make significant improvements in function in a reasonable and predictable amount of time.  Equipment Recommendations  Tub/shower seat    Recommendations for Other Services       Precautions / Restrictions Precautions Precautions: Fall Restrictions Weight Bearing Restrictions: No       Mobility Bed Mobility Overal bed mobility: Needs Assistance Bed Mobility: Supine to Sit     Supine to sit: Min assist       Patient Response: Cooperative  Transfers Overall transfer level: Needs assistance Equipment used: Rolling walker (2 wheels) Transfers: Sit to/from Stand, Bed to chair/wheelchair/BSC Sit to Stand: Min guard     Step pivot transfers: Min guard            Balance Overall balance assessment: Needs assistance Sitting-balance support: Feet supported, Bilateral upper extremity supported Sitting balance-Leahy Scale: Good     Standing balance support: Reliant on assistive device for balance Standing balance-Leahy Scale: Poor                             ADL either performed or assessed with clinical judgement   ADL Overall ADL's : Needs assistance/impaired Eating/Feeding: Independent;Sitting   Grooming: Wash/dry hands;Wash/dry face;Oral care;Supervision/safety;Standing           Upper Body Dressing : Set up;Sitting   Lower Body Dressing: Min guard;Sit to/from stand   Toilet Transfer: Supervision/safety;Ambulation;Rolling walker (2 wheels)                   Vision Patient Visual Report: No change from baseline       Perception Perception Perception: Within Functional Limits   Praxis Praxis Praxis: Intact    Pertinent Vitals/Pain Pain Assessment Pain Assessment: No/denies pain Pain Intervention(s): Monitored during session     Hand Dominance Right   Extremity/Trunk Assessment Upper Extremity Assessment Upper Extremity Assessment: Overall WFL for tasks assessed   Lower Extremity Assessment Lower Extremity Assessment: Defer to PT evaluation   Cervical / Trunk  Assessment Cervical / Trunk Assessment: Kyphotic   Communication Communication Communication: No difficulties   Cognition Arousal/Alertness: Awake/alert Behavior During Therapy: WFL for tasks assessed/performed Overall Cognitive Status: No  family/caregiver present to determine baseline cognitive functioning                                 General Comments: following commands, decreased safety.     General Comments   HR to 165 with mobility, and patient ranged 88-91% on RA and 94% with O2.      Exercises     Shoulder Instructions      Home Living Family/patient expects to be discharged to:: Assisted living Living Arrangements: Other (Comment) (roommate) Available Help at Discharge: Available PRN/intermittently;Other (Comment)         Home Layout: One level     Bathroom Shower/Tub: Occupational psychologist: Standard         Additional Comments: [facility] ALF      Prior Functioning/Environment Prior Level of Function : Needs assist           ADLs (physical): Grooming;Bathing;Dressing;IADLs Mobility Comments: patient reports she would walk to the dining hall for meals ADLs Comments: staff prepares meals, assist with bathing        OT Problem List: Impaired balance (sitting and/or standing);Decreased safety awareness;Decreased strength;Decreased activity tolerance      OT Treatment/Interventions: Self-care/ADL training;Balance training;Therapeutic activities;DME and/or AE instruction    OT Goals(Current goals can be found in the care plan section) Acute Rehab OT Goals Patient Stated Goal: Return home OT Goal Formulation: With patient Time For Goal Achievement: 02/02/22 Potential to Achieve Goals: Good ADL Goals Pt Will Perform Grooming: with modified independence;standing Pt Will Perform Lower Body Dressing: with modified independence;sit to/from stand Pt Will Transfer to Toilet: with modified independence;ambulating;regular height toilet  OT Frequency: Min 2X/week    Co-evaluation              AM-PAC OT "6 Clicks" Daily Activity     Outcome Measure Help from another person eating meals?: None Help from another person taking care of personal grooming?: A  Little Help from another person toileting, which includes using toliet, bedpan, or urinal?: A Little Help from another person bathing (including washing, rinsing, drying)?: A Little Help from another person to put on and taking off regular upper body clothing?: None Help from another person to put on and taking off regular lower body clothing?: A Little 6 Click Score: 20   End of Session Equipment Utilized During Treatment: Gait belt;Rolling walker (2 wheels) Nurse Communication: Mobility status  Activity Tolerance: Patient tolerated treatment well Patient left: in chair;with call bell/phone within reach;with chair alarm set  OT Visit Diagnosis: Unsteadiness on feet (R26.81);Muscle weakness (generalized) (M62.81);Other symptoms and signs involving cognitive function                Time: 9563-8756 OT Time Calculation (min): 27 min Charges:  OT General Charges $OT Visit: 1 Visit OT Evaluation $OT Eval Moderate Complexity: 1 Mod OT Treatments $Self Care/Home Management : 8-22 mins  01/19/2022  RP, OTR/L  Acute Rehabilitation Services  Office:  [phone]   Metta Clines 01/19/2022, 9:58 AM

## 2022-01-20 ENCOUNTER — Inpatient Hospital Stay (HOSPITAL_COMMUNITY): Payer: Medicare Other

## 2022-01-20 DIAGNOSIS — E871 Hypo-osmolality and hyponatremia: Secondary | ICD-10-CM | POA: Diagnosis not present

## 2022-01-20 DIAGNOSIS — G9340 Encephalopathy, unspecified: Secondary | ICD-10-CM

## 2022-01-20 LAB — CULTURE, BLOOD (ROUTINE X 2)
Culture: NO GROWTH
Culture: NO GROWTH
Special Requests: ADEQUATE
Special Requests: ADEQUATE

## 2022-01-20 LAB — CBC WITH DIFFERENTIAL/PLATELET
Abs Immature Granulocytes: 0.03 10*3/uL (ref 0.00–0.07)
Basophils Absolute: 0.1 10*3/uL (ref 0.0–0.1)
Basophils Relative: 1 %
Eosinophils Absolute: 0.3 10*3/uL (ref 0.0–0.5)
Eosinophils Relative: 5 %
HCT: 34.6 % — ABNORMAL LOW (ref 36.0–46.0)
Hemoglobin: 11.3 g/dL — ABNORMAL LOW (ref 12.0–15.0)
Immature Granulocytes: 1 %
Lymphocytes Relative: 13 %
Lymphs Abs: 0.8 10*3/uL (ref 0.7–4.0)
MCH: 30.1 pg (ref 26.0–34.0)
MCHC: 32.7 g/dL (ref 30.0–36.0)
MCV: 92.3 fL (ref 80.0–100.0)
Monocytes Absolute: 0.8 10*3/uL (ref 0.1–1.0)
Monocytes Relative: 12 %
Neutro Abs: 4.4 10*3/uL (ref 1.7–7.7)
Neutrophils Relative %: 68 %
Platelets: 201 10*3/uL (ref 150–400)
RBC: 3.75 MIL/uL — ABNORMAL LOW (ref 3.87–5.11)
RDW: 12.4 % (ref 11.5–15.5)
WBC: 6.4 10*3/uL (ref 4.0–10.5)
nRBC: 0 % (ref 0.0–0.2)

## 2022-01-20 LAB — BASIC METABOLIC PANEL
Anion gap: 8 (ref 5–15)
BUN: 11 mg/dL (ref 8–23)
CO2: 36 mmol/L — ABNORMAL HIGH (ref 22–32)
Calcium: 9.2 mg/dL (ref 8.9–10.3)
Chloride: 85 mmol/L — ABNORMAL LOW (ref 98–111)
Creatinine, Ser: 0.78 mg/dL (ref 0.44–1.00)
GFR, Estimated: >60 mL/min (ref >60)
Glucose, Bld: 96 mg/dL (ref 70–99)
Potassium: 4.2 mmol/L (ref 3.5–5.1)
Sodium: 129 mmol/L — ABNORMAL LOW (ref 135–145)

## 2022-01-20 LAB — UREA NITROGEN, URINE: Urea Nitrogen, Ur: 338 mg/dL

## 2022-01-20 LAB — BRAIN NATRIURETIC PEPTIDE: B Natriuretic Peptide: 778.5 pg/mL — ABNORMAL HIGH (ref 0.0–100.0)

## 2022-01-20 LAB — MAGNESIUM: Magnesium: 2.2 mg/dL (ref 1.7–2.4)

## 2022-01-20 MED ORDER — SPIRONOLACTONE 25 MG PO TABS
25.0000 mg | ORAL_TABLET | Freq: Every day | ORAL | Status: DC
  Administered 2022-01-20: 25 mg via ORAL
  Filled 2022-01-20: qty 1

## 2022-01-20 MED ORDER — FUROSEMIDE 10 MG/ML IJ SOLN
40.0000 mg | Freq: Once | INTRAMUSCULAR | Status: AC
  Administered 2022-01-20: 40 mg via INTRAVENOUS
  Filled 2022-01-20: qty 4

## 2022-01-20 MED ORDER — FUROSEMIDE 10 MG/ML IJ SOLN
20.0000 mg | Freq: Once | INTRAMUSCULAR | Status: AC
  Administered 2022-01-20: 20 mg via INTRAVENOUS
  Filled 2022-01-20: qty 2

## 2022-01-20 NOTE — Consult Note (Signed)
[facility] follow up Face-to-Face Psychiatric Evaluation   Service Date: January 20, 2022 LOS:  LOS: 5 days    Assessment  [name] is a 81 y.o. female admitted medically for 01/14/2022 11:59 PM for encephalopathy likely secondary to severe hyponatremia. She carries the psychiatric diagnoses of paranoid schizophrenia, type 1 bipolar disorder and has a past medical history of CKD stage 2, systolic heart failure, pressure injuries.Psychiatry was consulted for psychotropic medication adjustments in setting of severe hyponatremia by Salvadore Dom, NP.   01/20/22 Patient seen face to face in her hospital room sitting up in her bed. Today, she is alert, oriented, calm and cooperative. She reports favorable response to her current medications as evidenced by improved symptoms. She states that she had a good night sleep, ate her breakfast, denies psychosis, delusions and self harming thoughts. She requested to be discharge back to her current assisted living facility after she is medically cleared. Lab reports shows that Na level has been trending up nicely from 125 on 10/12 to 129 today but still below normal level.   Diagnoses:  Active Hospital problems: Principal Problem:   Hyponatremia Active Problems:   Paranoid schizophrenia (HCC)   Severe manic bipolar 1 disorder with psychotic behavior (HCC)   Pressure injury of skin   SIADH (syndrome of inappropriate ADH production) (HCC)   Systolic heart failure (HCC)   CKD (chronic kidney disease), stage II     Plan  ## Safety and Observation Level:  - Based on my clinical evaluation, I estimate the patient to be at minimal risk of self harm in the current setting  ## Medications:  -- Continue risperidone 1 mg daily for paranoid schizophrenia  -Given her 30+ year history of being on risperidone and relative psychiatric stability for the past year, continuing the risperidone outweighs the risk of a contributing to  SIADH.  -Qtc 516 but risk of decompensation of paranoid schizophrenia outweighs risk -- Continue  mirtazapine 7.5 mg qhs. Need to balance risk for SIADH (beers criteria) vs decompensation of schizophrenia. -- Continue Ingrezza 40 mg daily for TD -- Continue melatonin 3 mg for refractory insomnia -- Minimize agents that may contribute to SIADH but need to balance with risk of psychotic decompensation  ## Medical Decision Making Capacity:  Did not formally assess  ## Further Work-up:  -- per primary team -- most recent EKG on 10/9 had QtC of 516 -- Pertinent labwork reviewed earlier this admission includes: Na 123 (on admission 110), BNP 1425.3>1148.7.  ## Disposition:  -- ALF once medically stable  ##Legal Status Patient has guardian (heather chambers, niece)  Thank you for this consult request. Recommendations have been communicated to the primary team.  We will continue to follow at this time.   Corena Pilgrim, MD   Follow Up  Relevant Aspects of Hospital Course:  Admitted on 01/14/2022 for severe hypoosmolar hyponatremia likely secondary to SIADH.  Patient Report:  Patient reports insomnia last night.  Patient reports that melatonin 3 mg did help with her ability sleep.  Patient was agreeable to starting Ingrezza for tardive dyskinesia and Remeron for anxiety with the added benefits of sedation.     Collateral information:  Insomnia last night per chart review. Received melatonin with good effect.  Psychiatric History:  Diagnosis of schizophrenia since age of 71, significant EPS with haldol, multiple psychiatric hospitalizations and IVC. No substance use history of note.  Social History:  Tobacco use: denies Alcohol use: denies Drug use: denies  Family History:  The patient's family history includes COPD in her father; Colon polyps in an other family member; Diabetes in her paternal aunt and paternal grandmother; Heart attack in her father; Heart failure in her mother;  Hyperlipidemia in her mother; Hypertension in her father and mother; Stroke in her mother.  Medical History: Past Medical History:  Diagnosis Date   Anemia 1990   hx of   Anxiety    Arthritis 2004   Asthma 1995   uses inhaler   Chronic kidney disease 2010   LEFT kidney removed   Colon polyp    Complication of anesthesia 1966   first surgery she had- had a hard time waking up from surgery   Delusional disorder ([place])    Six delusional disorder   Diverticulosis 2003   GERD (gastroesophageal reflux disease)    on meds   Schizophrenia [facility])     Surgical History: Past Surgical History:  Procedure Laterality Date   APPENDECTOMY  1966   CATARACT EXTRACTION     Bilateral   CHOLECYSTECTOMY  2004   COLON SURGERY  2008   COLONOSCOPY  2013   JP-MAC-moviprep(good)-TA   COLOSTOMY     COLOSTOMY TAKEDOWN     DIAGNOSTIC LAPAROSCOPY     exploratory for endometriosis   HEEL SPUR SURGERY     Right, Metal Plate in Heel   [place]  2010   NEPHRECTOMY Left 6387   As complication of partial colectomy for diverticulitis   POLYPECTOMY  2013   TA   TONSILLECTOMY     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   Procedure: LEFT TOTAL KNEE ARTHROPLASTY;  Surgeon: Susa Day, MD;  Location: [facility];  Service: Orthopedics;  Laterality: Left;  Adductor Block    Medications:   Current Facility-Administered Medications:    acetaminophen (TYLENOL) tablet 650 mg, 650 mg, Oral, Q6H PRN, Halford Chessman, Vineet, MD   albuterol (PROVENTIL) (2.5 MG/3ML) 0.083% nebulizer solution 2.5 mg, 2.5 mg, Nebulization, Q4H PRN, Halford Chessman, Vineet, MD   amantadine (SYMMETREL) capsule 100 mg, 100 mg, Oral, BID, Halford Chessman, Vineet, MD, 100 mg at 01/20/22 5643   Chlorhexidine Gluconate Cloth 2 % PADS 6 each, 6 each, Topical, Daily, Chesley Mires, MD, 6 each at 01/20/22 0953   cyanocobalamin (VITAMIN B12) tablet 1,000 mcg, 1,000 mcg, Oral, Daily, Lala Lund K, MD, 1,000 mcg at 01/20/22 3295   docusate sodium (COLACE)  capsule 100 mg, 100 mg, Oral, BID PRN, Chesley Mires, MD   fluticasone (FLONASE) 50 MCG/ACT nasal spray 2 spray, 2 spray, Each Nare, Daily, Sood, Vineet, MD, 2 spray at 01/19/22 0921   furosemide (LASIX) injection 20 mg, 20 mg, Intravenous, Once, Thurnell Lose, MD   gabapentin (NEURONTIN) capsule 100 mg, 100 mg, Oral, QHS, Sood, Vineet, MD, 100 mg at 01/19/22 2151   heparin injection 5,000 Units, 5,000 Units, Subcutaneous, Q8H, Sood, Vineet, MD, 5,000 Units at 01/20/22 0523   melatonin tablet 3 mg, 3 mg, Oral, QHS PRN, France Ravens, MD   mirtazapine (REMERON) tablet 7.5 mg, 7.5 mg, Oral, QHS, France Ravens, MD, 7.5 mg at 01/19/22 2152   Oral care mouth rinse, 15 mL, Mouth Rinse, 4 times per day, Chesley Mires, MD, 15 mL at 01/20/22 1127   Oral care mouth rinse, 15 mL, Mouth Rinse, PRN, Halford Chessman, Vineet, MD   pantoprazole (PROTONIX) EC tablet 40 mg, 40 mg, Oral, Daily, Lala Lund K, MD, 40 mg at 01/20/22 0952   polyethylene glycol (MIRALAX / GLYCOLAX) packet 17 g, 17 g, Oral,  Daily PRN, Chesley Mires, MD   risperiDONE (RISPERDAL) tablet 1 mg, 1 mg, Oral, QHS, Sood, Vineet, MD, 1 mg at 01/19/22 2152   sodium chloride flush (NS) 0.9 % injection 10-40 mL, 10-40 mL, Intracatheter, PRN, Chesley Mires, MD   spironolactone (ALDACTONE) tablet 25 mg, 25 mg, Oral, Daily, Lala Lund K, MD, 25 mg at 01/20/22 1127   umeclidinium-vilanterol (ANORO ELLIPTA) 62.5-25 MCG/ACT 1 puff, 1 puff, Inhalation, Daily, Sood, Vineet, MD, 1 puff at 01/20/22 1004   valbenazine (INGREZZA) capsule 40 mg, 40 mg, Oral, Daily, France Ravens, MD, 40 mg at 01/20/22 5498  Allergies: Allergies  Allergen Reactions   Haloperidol Anaphylaxis   Aripiprazole Other (See Comments)    Bad thoughts    Chlorpheniramine-Pse-Ibuprofen     Other reaction(s): Cough       Objective  Vital signs:  Temp:  [98 F (36.7 C)-98.7 F (37.1 C)] 98.5 F (36.9 C) (10/14 1100) Pulse Rate:  [72-98] 72 (10/14 1100) Resp:  [11-20] 14 (10/14  1100) BP: (107-139)/(49-78) 131/62 (10/14 1100) SpO2:  [90 %-96 %] 90 % (10/14 1100) Weight:  [85.1 kg] 85.1 kg (10/14 0431)  Psychiatric Specialty Exam:  Presentation  General Appearance: Appropriate for Environment; Casual  Eye Contact:Good  Speech:Clear and Coherent; Normal Rate  Speech Volume:Normal  Handedness:No data recorded  Mood and Affect  Mood: calm   Affect:Flat   Thought Process  Thought Processes:Coherent; Goal Directed  Descriptions of Associations:Intact  Orientation:Full (Time, Place and Person)  Thought Content:Logical  History of Schizophrenia/Schizoaffective disorder:No data recorded Duration of Psychotic Symptoms:No data recorded Hallucinations:No data recorded  Ideas of Reference:None  Suicidal Thoughts:denies  Homicidal Thoughts:denies   Sensorium  Memory:Immediate Good; Recent Poor; Remote Poor  Judgment:Impaired  Insight:Fair   Executive Functions  Concentration:Fair  Attention Span:Fair  [place]   Psychomotor Activity  Psychomotor Activity:No data recorded   Assets  Assets:Communication Skills; Housing; Social Support   Sleep  Sleep:No data recorded    Physical Exam: Physical Exam ROS Blood pressure 131/62, pulse 72, temperature 98.5 F (36.9 C), temperature source Oral, resp. rate 14, height _0  (1.575 m), weight 85.1 kg, SpO2 90 %. Body mass index is 34.31 kg/m.

## 2022-01-20 NOTE — Plan of Care (Signed)
  Problem: Clinical Measurements: Goal: Ability to maintain clinical measurements within normal limits will improve Outcome: Progressing Goal: Diagnostic test results will improve Outcome: Progressing Goal: Respiratory complications will improve Outcome: Progressing Goal: Cardiovascular complication will be avoided Outcome: Progressing   Problem: Coping: Goal: Level of anxiety will decrease Outcome: Progressing   

## 2022-01-20 NOTE — Plan of Care (Signed)

## 2022-01-20 NOTE — TOC Initial Note (Signed)
Transition of Care [facility]) - Initial/Assessment Note    Patient Details  Name: [name] MRN: [id number] Date of Birth: 10-25-1940  Transition of Care [facility]) CM/SW Contact:    Coralee Pesa, [place] Phone Number: 01/20/2022, 12:53 PM  Clinical Narrative:                 CSW spoke with Niece [name], and confirmed she is pt's Legal Guardian. Discharge plans were discussed and LG would prefer for pt to return to ALF at discharge. She states pt would not want to leave and go somewhere else. CSW and niece discussed pt's current functioning and she noted pt can get some PT at the ALF when she discharges. Niece unable to transport, agreeable to SAFE transport at discharge. Pt will need FL2 prior to DC. ALF info in prior CSW note. TOC will continue to follow for DC needs.  Expected Discharge Plan: Assisted Living Barriers to Discharge: Continued Medical Work up   Patient Goals and CMS Choice Patient states their goals for this hospitalization and ongoing recovery are:: LG wants pt to be able to return to ALF.   Choice offered to / list presented to : [name] POA / Guardian  Expected Discharge Plan and Services Expected Discharge Plan: Assisted Living     Post Acute Care Choice: Resumption of Svcs/PTA Provider Living arrangements for the past 2 months: Assisted Living Facility                                      Prior Living Arrangements/Services Living arrangements for the past 2 months: [place] Lives with:: Facility Resident Patient language and need for interpreter reviewed:: Yes Do you feel safe going back to the place where you live?: Yes      Need for Family Participation in Patient Care: Yes (Comment) Care giver support system in place?: Yes (comment) Current home services: Home OT, Home PT Criminal Activity/Legal Involvement Pertinent to Current Situation/Hospitalization: No - Comment as needed  Activities of Daily Living   ADL Screening  (condition at time of admission) Is the patient deaf or have difficulty hearing?: No Does the patient have difficulty seeing, even when wearing glasses/contacts?: No Does the patient have difficulty concentrating, remembering, or making decisions?: No Does the patient have difficulty dressing or bathing?: Yes Does the patient have difficulty walking or climbing stairs?: Yes  Permission Sought/Granted Permission sought to share information with : Guardian Permission granted to share information with : Yes, Verbal Permission Granted  Share Information with NAME: [name]     Permission granted to share info w Relationship: Niece/ LG  Permission granted to share info w Contact Information: [phone]  Emotional Assessment Appearance:: Appears stated age     Orientation: : Oriented to Self, Oriented to Place, Oriented to  Time, Oriented to Situation Alcohol / Substance Use: Not Applicable Psych Involvement: Yes (comment)  Admission diagnosis:  Delirium [R41.0] Hyponatremia [T61.4] Acute systolic congestive heart failure ([place]) [I50.21] Acute cystitis without hematuria [N30.00] Acute respiratory failure with hypoxia ([place]) [J96.01] Patient Active Problem List   Diagnosis Date Noted   SIADH (syndrome of inappropriate ADH production) ([place]) 43/15/4008   Systolic heart failure ([place]) 01/16/2022   CKD (chronic kidney disease), stage II 01/16/2022   Hyponatremia 01/15/2022   Pressure injury of skin 01/15/2022   hyperthyroidism  67/61/9509   Acute metabolic encephalopathy - RESOLVED 10/24/2021   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  Elevated troponin 10/24/2021   Pain due to onychomycosis of toenails of both feet 09/12/2020   Chronic low back pain without sciatica 10/02/2017   Essential hypertension 10/02/2017   Paranoid schizophrenia ([place]) 09/19/2017   Delusional disorder, 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[place]) 09/18/2017   Behavioral change 09/18/2017   Severe manic bipolar 1 disorder with psychotic behavior ([place])  08/01/2017   History of left inguinal hernia repair 08/06/2016   H/O total knee replacement, left 06/19/2016   Primary osteoarthritis of left knee 04/04/2016   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/2016   Anxiety 11/15/2015   Gastroesophageal reflux disease without esophagitis 11/15/2015   Solitary right kidney 07/07/2015   Urge incontinence of urine 07/07/2015   Asthma 12/07/2011   Arthritis 12/07/2011   Thoracic spondylosis - mild to moderate 2009 12/07/2011   Obesity  12/07/2011   Incisional hernia 03/29/2011   DIVERTICULOSIS-COLON 12/01/2008   ABDOMINAL PAIN-RLQ 12/01/2008   PCP:  Lubertha Sayres, FNP Pharmacy:   [id number] [place] [place]Phone: [phone]: [id number]: [phone]: [phone]    Social Determinants of Health (SDOH) Interventions    Readmission Risk Interventions     No data to display

## 2022-01-20 NOTE — Progress Notes (Signed)
PROGRESS NOTE                                                                                                                                                                                                             Patient Demographics:    [name], is a 81 y.o. female, DOB - [birth year], FKC:[id number]  Outpatient Primary MD for the patient is Lubertha Sayres, FNP    LOS - 5  Admit date - 01/14/2022    Chief Complaint  Patient presents with   Urinary Tract Infection       Brief Narrative (HPI from H&P)   81 year old female patient with known past medical history of chronic systolic heart failure EF 25%, CKD stage II, GERD, hypertension, anxiety, diverticulosis, schizophrenia who lives at an assisted living facility who presented to [facility] from assisted care.  Presented with altered mental status, following about 2 weeks of progressive weakness.  In the emergency department was found to be short of breath, requiring supplemental oxygen, sodium was 111, BUN 10, creatinine 0.62 CT of head was negative chest x-ray demonstrating mild pulmonary edema.  Because of her symptomatic hyponatremia she was transferred to [facility] for treatment.  Admitted by PCCM required 3% normal saline administration, her sodium levels have improved since mentation has improved and she was transferred to hospitalist service on 01/19/2022.   Subjective:   Patient in bed, appears comfortable, denies any headache, no fever, no chest pain or pressure, no shortness of breath , no abdominal pain. No new focal weakness.   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  She has been diuresed also required 3% normal saline in ICU, sodium levels have improved, mentation is close to baseline, no headache or focal deficits.  Continue fluid  restriction and gentle diuresis and monitor.  Acute on chronic systolic CHF.  EF known to be around 20%.  Blood pressure low, not on ACE/ARB or beta-blocker due to hypotension, diuretics as tolerated, continue to monitor.  HX of bipolar disorder with psychotic behavior, now diagnosed with schizophrenia and delusional disorder per psych.  Psych monitoring and managing medications.  Dysphagia.  Speech following dysphagia 3 diet.  COPD.  No wheezing.  Supportive care.  Try to titrate  off oxygen.    Weakness and deconditioning.  PT OT.  May require SNF.  Hypomagnesemia.  Replaced.  Question underlying CKD stage II.  Currently creatinine within normal limits.      Condition - Fair  Family Communication  :  None present  Code Status :  Full  Consults  :  PCCM  PUD Prophylaxis :    Procedures  :     Right arm PICC line placed 01/15/2022 - 01/20/23       Disposition Plan  :    Status is: Inpatient  DVT Prophylaxis  :    heparin injection 5,000 Units Start: 01/15/22 1400   Lab Results  Component Value Date   PLT 201 01/20/2022    Diet :  Diet Order             DIET DYS 3 Room service appropriate? Yes; Fluid consistency: Thin; Fluid restriction: 1200 mL Fluid  Diet effective now                    Inpatient Medications  Scheduled Meds:  amantadine  100 mg Oral BID   Chlorhexidine Gluconate Cloth  6 each Topical Daily   vitamin B-12  1,000 mcg Oral Daily   fluticasone  2 spray Each Nare Daily   furosemide  20 mg Intravenous Once   furosemide  40 mg Intravenous Once   gabapentin  100 mg Oral QHS   heparin  5,000 Units Subcutaneous Q8H   mirtazapine  7.5 mg Oral QHS   mouth rinse  15 mL Mouth Rinse 4 times per day   pantoprazole  40 mg Oral Daily   risperiDONE  1 mg Oral QHS   spironolactone  25 mg Oral Daily   umeclidinium-vilanterol  1 puff Inhalation Daily   valbenazine  40 mg Oral Daily   Continuous Infusions:   PRN Meds:.acetaminophen,  albuterol, docusate sodium, melatonin, mouth rinse, polyethylene glycol, sodium chloride flush  Antibiotics  :    Anti-infectives (From admission, onward)    Start     Dose/Rate Route Frequency Ordered Stop   01/15/22 0230  cefTRIAXone (ROCEPHIN) 1 g in sodium chloride 0.9 % 100 mL IVPB        1 g 200 mL/hr over 30 Minutes Intravenous  Once 01/15/22 0221 01/15/22 0309         Objective:   Vitals:   01/20/22 0321 01/20/22 0400 01/20/22 0431 01/20/22 0806  BP: 124/78 (!) 126/52  139/76  Pulse: 98 84  88  Resp: _0 Temp: 98.7 F (37.1 C) 98 F (36.7 C)  98.1 F (36.7 C)  TempSrc: Oral Oral  Oral  SpO2: 90% 92%  90%  Weight:   85.1 kg   Height:        Wt Readings from Last 3 Encounters:  01/20/22 85.1 kg  01/09/22 83.6 kg  12/19/21 81.2 kg     Intake/Output Summary (Last 24 hours) at 01/20/2022 0931 Last data filed at 01/20/2022 0600 Gross per 24 hour  Intake --  Output 3450 ml  Net -3450 ml     Physical Exam  Awake Alert, No new F.N deficits, Foley catheter [place].AT,PERRAL Supple Neck, No JVD,   Symmetrical Chest wall movement, Good air movement bilaterally, CTAB RRR,No Gallops, Rubs or new Murmurs,  +ve B.Sounds, Abd Soft, No tenderness,   Trace edema      RN pressure injury documentation: Pressure Injury 01/15/22 Sacrum Medial Stage 1 -  Intact  skin with non-blanchable redness of a localized area usually over a bony prominence. (Active)  01/15/22 1302  Location: Sacrum  Location Orientation: Medial  Staging: Stage 1 -  Intact skin with non-blanchable redness of a localized area usually over a bony prominence.  Wound Description (Comments):   Present on Admission: Yes  Dressing Type Foam - Lift dressing to assess site every shift 01/19/22 2033      Data Review:    CBC Recent Labs  Lab 01/15/22 0105 01/16/22 0429 01/19/22 0519 01/20/22 0436  WBC 7.6 5.8 6.1 6.4  HGB 12.6 11.3* 11.6* 11.3*  HCT 35.4* 33.0* 34.7* 34.6*  PLT 214 201  158 201  MCV 86.6 88.9 91.8 92.3  MCH 30.8 30.5 30.7 30.1  MCHC 35.6 34.2 33.4 32.7  RDW 11.9 12.0 12.2 12.4  LYMPHSABS 0.5*  --  0.8 0.8  MONOABS 0.8  --  0.8 0.8  EOSABS 0.0  --  0.2 0.3  BASOSABS 0.0  --  0.1 0.1    Electrolytes Recent Labs  Lab 01/15/22 0105 01/15/22 0153 01/15/22 0155 01/15/22 0155 01/15/22 0301 01/15/22 1038 01/15/22 1525 01/16/22 0429 01/16/22 0556 01/17/22 0544 01/17/22 1242 01/18/22 0122 01/18/22 0640 01/18/22 1706 01/19/22 0519 01/19/22 0639 01/20/22 0436  NA  --   --  110*   < > 111* 112*   < > 118*   < > 122*   < > 125* 128* 125* 127*  --  129*  K  --   --  4.7   < > 4.5 4.7   < > 4.1  --  4.3  --   --  3.8 4.2 4.1  --  4.2  CL  --   --  79*   < > 77* 78*   < > 80*  --  86*  --   --  86* 84* 87*  --  85*  CO2  --   --  22   < > 24 24   < > 28  --  29  --   --  31 34* 33*  --  36*  GLUCOSE  --   --  118*   < > 118* 107*   < > 85  --  99  --   --  139* 112* 100*  --  96  BUN  --   --  10   < > 10 8   < > 9  --  10  --   --  6* 10 9  --  11  CREATININE  --   --  0.62   < > 0.65 0.70   < > 0.81  --  0.80  --   --  0.80 0.75 0.68  --  0.78  CALCIUM  --   --  8.1*   < > 8.2* 8.5*   < > 7.8*  --  7.9*  --   --  8.4* 8.2* 8.4*  --  9.2  AST  --   --  22  --  24 25  --   --   --   --   --   --   --   --   --   --   --   ALT  --   --  23  --  25 24  --   --   --   --   --   --   --   --   --   --   --  ALKPHOS  --   --  99  --  105 95  --   --   --   --   --   --   --   --   --   --   --   BILITOT  --   --  1.0  --  1.0 0.9  --   --   --   --   --   --   --   --   --   --   --   ALBUMIN  --   --  3.4*  --  3.7 3.4*  --   --   --   --   --   --   --   --   --   --   --   MG  --   --   --   --   --   --   --   --   --   --   --   --   --   --   --  1.6* 2.2  PROCALCITON  --   --   --   --   --  <0.10  --   --   --   --   --   --   --   --   --   --   --   LATICACIDVEN  --  0.8  --   --   --   --   --   --   --   --   --   --   --   --   --   --   --    TSH  --   --   --   --   --  1.121  --   --   --   --   --   --   --   --   --   --   --   AMMONIA  --  17  --   --   --   --   --   --   --   --   --   --   --   --   --   --   --   BNP 1,173.0*  --   --   --   --  1,425.3*  --  1,148.7*  --   --   --   --   --   --   --  616.7* 778.5*   < > = values in this interval not displayed.    Radiology Reports DG Swallowing Func-Speech Pathology  Result Date: 01/17/2022 Table formatting from the original result was not included. Objective Swallowing Evaluation: Type of Study: MBS-Modified Barium Swallow Study  Patient Details Name: TIYA SCHRUPP MRN: [id number] Date of Birth: [birth year] Today's Date: 01/17/2022 Time: SLP Start Time (ACUTE ONLY): 1340 -SLP Stop Time (ACUTE ONLY): 1400 SLP Time Calculation (min) (ACUTE ONLY): 20 min Past Medical History: Past Medical History: Diagnosis Date  Anemia 1990  hx of  Anxiety   Arthritis 2004  Asthma 1995  uses inhaler  Chronic kidney disease 2010  LEFT kidney removed  Colon polyp   Complication of anesthesia 1966  first surgery she had- had a hard time waking up from surgery  Delusional disorder ([place])   [age] delusional disorder  Diverticulosis 2003  GERD (gastroesophageal reflux disease)   on meds  Schizophrenia ([place])  Past  Surgical History: Past Surgical History: Procedure Laterality Date  APPENDECTOMY  1966  CATARACT EXTRACTION    Bilateral  CHOLECYSTECTOMY  2004  COLON SURGERY  2008  COLONOSCOPY  2013  JP-MAC-moviprep(good)-TA  COLOSTOMY    COLOSTOMY TAKEDOWN    DIAGNOSTIC LAPAROSCOPY    exploratory for endometriosis  HEEL SPUR SURGERY    Right, Metal Plate in [place]  2010  NEPHRECTOMY Left 2595  As complication of partial colectomy for diverticulitis  POLYPECTOMY  2013  TA  TONSILLECTOMY    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  Procedure: LEFT TOTAL KNEE ARTHROPLASTY;  Surgeon: Susa Day, MD;  Location: [facility];  Service: Orthopedics;  Laterality: Left;  Adductor Block HPI: Patient is  a 81  y.o. female with PMH: HFrEF, GERD, HTN, anxiety, schizophrenia, CKD stage II presented to [facility] from her ALF with AMS.  Found to have severe hyponatremia and pulmonary/peripheral edema.  Transferred to [facility] on 01/15/22 for further management. On 10/10 RN observed patient to have a choking episode while eating lunch meal during which patient became blue in the face, had desaturation of oxygen into 70's with cyanosis. MD changed patient to NPO awaiting SLP to evaluate swallow function.  Subjective: pleasant, alert  Recommendations for follow up therapy are one component of a multi-disciplinary discharge planning process, led by the attending physician.  Recommendations may be updated based on patient status, additional functional criteria and insurance authorization. Assessment / Plan / Recommendation   01/17/2022   5:21 PM Clinical Impressions Clinical Impression Patient presents with a mild pharyngeal phase dysphagia but with oral phase of swallow that appears [facility]. During pharyngeal phase, patient exhibited swallow intiation delays at level of vallecular sinus with puree solids nectar thick liquids and swallow initiation delays to level of pyriform with thin liquids (intermittently with nectar thick liquids). Trace, silent aspiration occured with thin liquids during the swallow when patient taking large sips and tilting head backwards. When patient kept head in neutral position and took small to average sized sips, no aspiration observed. Only trace vallecular and pyriform sinus residuals observed with thin liquids however subsequent swallows cleared residuals fully. Barium tablet taken with puree solids and soft solid textures both transited pharyngeally and through cervical esophagus without difficulty. SLP did not observe any swallow impairment during this MBS that would explain her recent choking event with solid texture PO's. Recommendation is for Dys 3 (mechanical soft)  solids and thin liquids. In addition,  it is recommended that patient sit upright in bed or preferably, recliner chair during PO intake. SLP Visit Diagnosis Dysphagia, unspecified (R13.10) Impact on safety and function Mild aspiration risk     01/17/2022   5:21 PM Treatment Recommendations Treatment Recommendations Therapy as outlined in treatment plan below     01/17/2022   5:28 PM Prognosis Prognosis for Safe Diet Advancement Good   01/17/2022   5:21 PM Diet Recommendations SLP Diet Recommendations Dysphagia 3 (Mech soft) solids;Thin liquid Liquid Administration via Straw;Cup Medication Administration Whole meds with puree Compensations Slow rate;Small sips/bites;Minimize environmental distractions Postural Changes Seated upright at 90 degrees     01/17/2022   5:21 PM Other Recommendations Oral Care Recommendations Oral care BID Follow Up Recommendations Follow physician's recommendations for discharge plan and follow up therapies Assistance recommended at discharge Frequent or constant Supervision/Assistance Functional Status Assessment Patient has had a recent decline in their functional status and demonstrates the ability to make significant improvements in function in a reasonable and predictable amount of time.  01/17/2022   5:21 PM Frequency and Duration  Speech Therapy Frequency (ACUTE ONLY) min 2x/week Treatment Duration 1 week     01/17/2022   5:18 PM Oral Phase Oral Phase [facility]    01/17/2022   5:18 PM Pharyngeal Phase Pharyngeal Phase Impaired Pharyngeal- Nectar Cup Delayed swallow initiation-vallecula;Delayed swallow initiation-pyriform sinuses Pharyngeal- Thin Cup Delayed swallow initiation-pyriform sinuses;Reduced airway/laryngeal closure;Penetration/Aspiration during swallow Pharyngeal Material enters airway, passes BELOW cords without attempt by patient to eject out (silent aspiration) Pharyngeal- Thin Straw Delayed swallow initiation-pyriform sinuses;Penetration/Aspiration during swallow;Trace aspiration Pharyngeal Material enters  airway, passes BELOW cords without attempt by patient to eject out (silent aspiration) Pharyngeal- Puree Delayed swallow initiation-vallecula Pharyngeal- Mechanical Soft WFL Pharyngeal- Pill [facility]    01/17/2022   5:20 PM Cervical Esophageal Phase  Cervical Esophageal Phase Impaired Cervical Esophageal Comment prominent cricopharyngeal bar observed which did not appear to significantly impede bolus transit Sonia Baller, MA, CCC-SLP Speech Therapy                     DG Chest Port 1 View  Result Date: 01/17/2022 CLINICAL DATA:  69678 CHF (congestive heart failure) ([place]) [address] EXAM: PORTABLE CHEST 1 VIEW COMPARISON:  January 15, 2022 FINDINGS: The cardiomediastinal silhouette is unchanged and enlarged in contour.RIGHT upper extremity PICC with tip terminating over the superior cavoatrial junction. Favored small bilateral pleural effusions. No pneumothorax. Perihilar vascular fullness, peribronchial cuffing and interstitial prominence, similar in comparison to prior. Visualized abdomen is unremarkable. IMPRESSION: 1.  Support apparatus as described above. 2. Pulmonary edema with small bilateral pleural effusions and scattered atelectasis. Electronically Signed   By: Valentino Saxon M.D.   On: 01/17/2022 07:46      Signature  Lala Lund M.D on 01/20/2022 at 9:31 AM   -  To page go to [email or web address]

## 2022-01-21 DIAGNOSIS — E871 Hypo-osmolality and hyponatremia: Secondary | ICD-10-CM | POA: Diagnosis not present

## 2022-01-21 LAB — CBC WITH DIFFERENTIAL/PLATELET
Abs Immature Granulocytes: 0.02 10*3/uL (ref 0.00–0.07)
Basophils Absolute: 0 10*3/uL (ref 0.0–0.1)
Basophils Relative: 0 %
Eosinophils Absolute: 0.3 10*3/uL (ref 0.0–0.5)
Eosinophils Relative: 3 %
HCT: 39.7 % (ref 36.0–46.0)
Hemoglobin: 13.1 g/dL (ref 12.0–15.0)
Immature Granulocytes: 0 %
Lymphocytes Relative: 10 %
Lymphs Abs: 1 10*3/uL (ref 0.7–4.0)
MCH: 30.2 pg (ref 26.0–34.0)
MCHC: 33 g/dL (ref 30.0–36.0)
MCV: 91.5 fL (ref 80.0–100.0)
Monocytes Absolute: 1 10*3/uL (ref 0.1–1.0)
Monocytes Relative: 10 %
Neutro Abs: 7.3 10*3/uL (ref 1.7–7.7)
Neutrophils Relative %: 77 %
Platelets: 228 10*3/uL (ref 150–400)
RBC: 4.34 MIL/uL (ref 3.87–5.11)
RDW: 12.3 % (ref 11.5–15.5)
WBC: 9.5 10*3/uL (ref 4.0–10.5)
nRBC: 0 % (ref 0.0–0.2)

## 2022-01-21 LAB — BASIC METABOLIC PANEL
Anion gap: 9 (ref 5–15)
BUN: 22 mg/dL (ref 8–23)
CO2: 36 mmol/L — ABNORMAL HIGH (ref 22–32)
Calcium: 9.1 mg/dL (ref 8.9–10.3)
Chloride: 84 mmol/L — ABNORMAL LOW (ref 98–111)
Creatinine, Ser: 1.19 mg/dL — ABNORMAL HIGH (ref 0.44–1.00)
GFR, Estimated: 47 mL/min — ABNORMAL LOW (ref >60)
Glucose, Bld: 99 mg/dL (ref 70–99)
Potassium: 4.4 mmol/L (ref 3.5–5.1)
Sodium: 129 mmol/L — ABNORMAL LOW (ref 135–145)

## 2022-01-21 LAB — BRAIN NATRIURETIC PEPTIDE: B Natriuretic Peptide: 660.1 pg/mL — ABNORMAL HIGH (ref 0.0–100.0)

## 2022-01-21 LAB — MAGNESIUM: Magnesium: 2 mg/dL (ref 1.7–2.4)

## 2022-01-21 MED ORDER — FUROSEMIDE 40 MG PO TABS
40.0000 mg | ORAL_TABLET | Freq: Once | ORAL | Status: AC
  Administered 2022-01-21: 40 mg via ORAL
  Filled 2022-01-21: qty 1

## 2022-01-21 NOTE — Progress Notes (Signed)
PROGRESS NOTE                                                                                                                                                                                                             Patient Demographics:    [name], is a 81 y.o. female, DOB - 12-22-1940, OVF:[id number]  Outpatient Primary MD for the patient is Lubertha Sayres, FNP    LOS - 6  Admit date - 01/14/2022    Chief Complaint  Patient presents with   Urinary Tract Infection       Brief Narrative (HPI from H&P)   81 year old female patient with known past medical history of chronic systolic heart failure EF 25%, CKD stage II, GERD, hypertension, anxiety, diverticulosis, schizophrenia who lives at an assisted living facility who presented to [facility] from assisted care.  Presented with altered mental status, following about 2 weeks of progressive weakness.  In the emergency department was found to be short of breath, requiring supplemental oxygen, sodium was 111, BUN 10, creatinine 0.62 CT of head was negative chest x-ray demonstrating mild pulmonary edema.  Because of her symptomatic hyponatremia she was transferred to [facility] for treatment.  Admitted by PCCM required 3% normal saline administration, her sodium levels have improved since mentation has improved and she was transferred to hospitalist service on 01/19/2022.   Subjective:   Patient in bed, appears comfortable, denies any headache, no fever, no chest pain or pressure, no shortness of breath , no abdominal pain. No new focal weakness.    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  She has been diuresed also required 3% normal saline in ICU, sodium levels have improved, mentation is close to baseline, no headache or focal deficits.  Continue fluid  restriction and gentle diuresis and monitor.  Acute on chronic systolic CHF.  EF known to be around 20%.  Blood pressure low, not on ACE/ARB or beta-blocker due to hypotension, diuretics as tolerated, continue to monitor.  HX of bipolar disorder with psychotic behavior, now diagnosed with schizophrenia and delusional disorder per psych.  Psych monitoring and managing medications.  Dysphagia.  Speech following dysphagia 3 diet.  COPD.  No wheezing.  Supportive care.  Try to  titrate off oxygen.    Weakness and deconditioning.  PT OT.  May require SNF.  Hypomagnesemia.  Replaced.  Question underlying CKD stage II.  Currently creatinine within normal limits.      Condition - Fair  Family Communication  :  None present  Code Status :  Full  Consults  :  PCCM  PUD Prophylaxis :    Procedures  :     Right arm PICC line placed 01/15/2022 - 01/20/23       Disposition Plan  :    Status is: Inpatient  DVT Prophylaxis  :    heparin injection 5,000 Units Start: 01/15/22 1400   Lab Results  Component Value Date   PLT 228 01/21/2022    Diet :  Diet Order             DIET DYS 3 Room service appropriate? Yes; Fluid consistency: Thin; Fluid restriction: 1200 mL Fluid  Diet effective now                    Inpatient Medications  Scheduled Meds:  amantadine  100 mg Oral BID   Chlorhexidine Gluconate Cloth  6 each Topical Daily   vitamin B-12  1,000 mcg Oral Daily   fluticasone  2 spray Each Nare Daily   gabapentin  100 mg Oral QHS   heparin  5,000 Units Subcutaneous Q8H   mirtazapine  7.5 mg Oral QHS   mouth rinse  15 mL Mouth Rinse 4 times per day   pantoprazole  40 mg Oral Daily   risperiDONE  1 mg Oral QHS   umeclidinium-vilanterol  1 puff Inhalation Daily   valbenazine  40 mg Oral Daily   Continuous Infusions:   PRN Meds:.acetaminophen, albuterol, docusate sodium, melatonin, mouth rinse, polyethylene glycol, sodium chloride flush  Antibiotics  :     Anti-infectives (From admission, onward)    Start     Dose/Rate Route Frequency Ordered Stop   01/15/22 0230  cefTRIAXone (ROCEPHIN) 1 g in sodium chloride 0.9 % 100 mL IVPB        1 g 200 mL/hr over 30 Minutes Intravenous  Once 01/15/22 0221 01/15/22 0309         Objective:   Vitals:   01/20/22 2332 01/21/22 0323 01/21/22 0442 01/21/22 0800  BP: (!) 104/56 119/70  108/72  Pulse: 86 97  79  Resp: _0 Temp: 97.8 F (36.6 C) 97.8 F (36.6 C)    TempSrc: Oral Oral  Oral  SpO2: 90% 92%  91%  Weight:   80.1 kg   Height:        Wt Readings from Last 3 Encounters:  01/21/22 80.1 kg  01/09/22 83.6 kg  12/19/21 81.2 kg     Intake/Output Summary (Last 24 hours) at 01/21/2022 0956 Last data filed at 01/21/2022 0800 Gross per 24 hour  Intake 360 ml  Output 1800 ml  Net -1440 ml     Physical Exam  Awake Alert, No new F.N deficits, Normal affect [place].AT,PERRAL Supple Neck, No JVD,   Symmetrical Chest wall movement, Good air movement bilaterally, CTAB RRR,No Gallops, Rubs or new Murmurs,  +ve B.Sounds, Abd Soft, No tenderness,   No Cyanosis, Clubbing or edema      RN pressure injury documentation: Pressure Injury 01/15/22 Sacrum Medial Stage 1 -  Intact skin with non-blanchable redness of a localized area usually over a bony prominence. (Active)  01/15/22 1302  Location: Sacrum  Location  Orientation: Medial  Staging: Stage 1 -  Intact skin with non-blanchable redness of a localized area usually over a bony prominence.  Wound Description (Comments):   Present on Admission: Yes  Dressing Type Foam - Lift dressing to assess site every shift 01/21/22 0800      Data Review:    CBC Recent Labs  Lab 01/15/22 0105 01/16/22 0429 01/19/22 0519 01/20/22 0436 01/21/22 0420  WBC 7.6 5.8 6.1 6.4 9.5  HGB 12.6 11.3* 11.6* 11.3* 13.1  HCT 35.4* 33.0* 34.7* 34.6* 39.7  PLT 214 201 158 201 228  MCV 86.6 88.9 91.8 92.3 91.5  MCH 30.8 30.5 30.7 30.1 30.2  MCHC  35.6 34.2 33.4 32.7 33.0  RDW 11.9 12.0 12.2 12.4 12.3  LYMPHSABS 0.5*  --  0.8 0.8 1.0  MONOABS 0.8  --  0.8 0.8 1.0  EOSABS 0.0  --  0.2 0.3 0.3  BASOSABS 0.0  --  0.1 0.1 0.0    Electrolytes Recent Labs  Lab 01/15/22 0153 01/15/22 0155 01/15/22 0155 01/15/22 0301 01/15/22 1038 01/15/22 1525 01/16/22 0429 01/16/22 0556 01/18/22 0640 01/18/22 1706 01/19/22 0519 01/19/22 0639 01/20/22 0436 01/21/22 0420  NA  --  110*   < > 111* 112*   < > 118*   < > 128* 125* 127*  --  129* 129*  K  --  4.7   < > 4.5 4.7   < > 4.1   < > 3.8 4.2 4.1  --  4.2 4.4  CL  --  79*   < > 77* 78*   < > 80*   < > 86* 84* 87*  --  85* 84*  CO2  --  22   < > 24 24   < > 28   < > 31 34* 33*  --  36* 36*  GLUCOSE  --  118*   < > 118* 107*   < > 85   < > 139* 112* 100*  --  96 99  BUN  --  10   < > 10 8   < > 9   < > 6* 10 9  --  11 22  CREATININE  --  0.62   < > 0.65 0.70   < > 0.81   < > 0.80 0.75 0.68  --  0.78 1.19*  CALCIUM  --  8.1*   < > 8.2* 8.5*   < > 7.8*   < > 8.4* 8.2* 8.4*  --  9.2 9.1  AST  --  22  --  24 25  --   --   --   --   --   --   --   --   --   ALT  --  23  --  25 24  --   --   --   --   --   --   --   --   --   ALKPHOS  --  99  --  105 95  --   --   --   --   --   --   --   --   --   BILITOT  --  1.0  --  1.0 0.9  --   --   --   --   --   --   --   --   --   ALBUMIN  --  3.4*  --  3.7 3.4*  --   --   --   --   --   --   --   --   --  MG  --   --   --   --   --   --   --   --   --   --   --  1.6* 2.2 2.0  PROCALCITON  --   --   --   --  <0.10  --   --   --   --   --   --   --   --   --   LATICACIDVEN 0.8  --   --   --   --   --   --   --   --   --   --   --   --   --   TSH  --   --   --   --  1.121  --   --   --   --   --   --   --   --   --   AMMONIA 17  --   --   --   --   --   --   --   --   --   --   --   --   --   BNP  --   --   --   --  1,425.3*  --  1,148.7*  --   --   --   --  616.7* 778.5* 660.1*   < > = values in this interval not displayed.    Radiology Reports DG  Chest Port 1 View  Result Date: 01/20/2022 CLINICAL DATA:  Asthma. EXAM: PORTABLE CHEST 1 VIEW COMPARISON:  01/17/2022 FINDINGS: 0732 hours. Low volume film. The cardio pericardial silhouette is enlarged. Interstitial markings are diffusely coarsened with chronic features. Left base atelectasis with tiny bilateral pleural effusions. Telemetry leads overlie the chest. IMPRESSION: Low volume film with left base atelectasis and tiny bilateral pleural effusions. Electronically Signed   By: Misty Stanley M.D.   On: 01/20/2022 09:43   DG Swallowing Func-Speech Pathology  Result Date: 01/17/2022 Table formatting from the original result was not included. Objective Swallowing Evaluation: Type of Study: MBS-Modified Barium Swallow Study  Patient Details Name: ZIYANA MORIKAWA MRN: [id number] Date of Birth: 07/13/1940 Today's Date: 01/17/2022 Time: SLP Start Time (ACUTE ONLY): 1340 -SLP Stop Time (ACUTE ONLY): 1400 SLP Time Calculation (Elizabeth) (ACUTE ONLY): 20 Elizabeth Past Medical History: Past Medical History: Diagnosis Date  Anemia 1990  hx of  Anxiety   Arthritis 2004  Asthma 1995  uses inhaler  Chronic kidney disease 2010  LEFT kidney removed  Colon polyp   Complication of anesthesia 1966  first surgery she had- had a hard time waking up from surgery  Delusional disorder ([place])   Six delusional disorder  Diverticulosis 2003  GERD (gastroesophageal reflux disease)   on meds  Schizophrenia [facility])  Past Surgical History: Past Surgical History: Procedure Laterality Date  APPENDECTOMY  1966  CATARACT EXTRACTION    Bilateral  CHOLECYSTECTOMY  2004  COLON SURGERY  2008  COLONOSCOPY  2013  JP-MAC-moviprep(good)-TA  COLOSTOMY    COLOSTOMY TAKEDOWN    DIAGNOSTIC LAPAROSCOPY    exploratory for endometriosis  HEEL SPUR SURGERY    Right, Metal Plate in Heel  [place]  2010  NEPHRECTOMY Left 9563  As complication of partial colectomy for diverticulitis  POLYPECTOMY  2013  TA  TONSILLECTOMY    TOTAL KNEE ARTHROPLASTY Left  04/04/2016  Procedure: LEFT TOTAL KNEE ARTHROPLASTY;  Surgeon: Susa Day, MD;  Location: [facility]S;  Service: Orthopedics;  Laterality: Left;  Adductor Block HPI: Patient is a 81  y.o. female with PMH: HFrEF, GERD, HTN, anxiety, schizophrenia, CKD stage II presented to [facility] from her ALF with AMS.  Found to have severe hyponatremia and pulmonary/peripheral edema.  Transferred to [facility] on 01/15/22 for further management. On 10/10 RN observed patient to have a choking episode while eating lunch meal during which patient became blue in the face, had desaturation of oxygen into 70's with cyanosis. MD changed patient to NPO awaiting SLP to evaluate swallow function.  Subjective: pleasant, alert  Recommendations for follow up therapy are one component of a multi-disciplinary discharge planning process, led by the attending physician.  Recommendations may be updated based on patient status, additional functional criteria and insurance authorization. Assessment / Plan / Recommendation   01/17/2022   5:21 PM Clinical Impressions Clinical Impression Patient presents with a mild pharyngeal phase dysphagia but with oral phase of swallow that appears [facility]. During pharyngeal phase, patient exhibited swallow intiation delays at level of vallecular sinus with puree solids nectar thick liquids and swallow initiation delays to level of pyriform with thin liquids (intermittently with nectar thick liquids). Trace, silent aspiration occured with thin liquids during the swallow when patient taking large sips and tilting head backwards. When patient kept head in neutral position and took small to average sized sips, no aspiration observed. Only trace vallecular and pyriform sinus residuals observed with thin liquids however subsequent swallows cleared residuals fully. Barium tablet taken with puree solids and soft solid textures both transited pharyngeally and through cervical esophagus without difficulty. SLP did not observe any swallow  impairment during this MBS that would explain her recent choking event with solid texture PO's. Recommendation is for Dys 3 (mechanical soft)  solids and thin liquids. In addition, it is recommended that patient sit upright in bed or preferably, recliner chair during PO intake. SLP Visit Diagnosis Dysphagia, unspecified (R13.10) Impact on safety and function Mild aspiration risk     01/17/2022   5:21 PM Treatment Recommendations Treatment Recommendations Therapy as outlined in treatment plan below     01/17/2022   5:28 PM Prognosis Prognosis for Safe Diet Advancement Good   01/17/2022   5:21 PM Diet Recommendations SLP Diet Recommendations Dysphagia 3 (Mech soft) solids;Thin liquid Liquid Administration via Straw;Cup Medication Administration Whole meds with puree Compensations Slow rate;Small sips/bites;Minimize environmental distractions Postural Changes Seated upright at 90 degrees     01/17/2022   5:21 PM Other Recommendations Oral Care Recommendations Oral care BID Follow Up Recommendations Follow physician's recommendations for discharge plan and follow up therapies Assistance recommended at discharge Frequent or constant Supervision/Assistance Functional Status Assessment Patient has had a recent decline in their functional status and demonstrates the ability to make significant improvements in function in a reasonable and predictable amount of time.   01/17/2022   5:21 PM Frequency and Duration  Speech Therapy Frequency (ACUTE ONLY) Elizabeth 2x/week Treatment Duration 1 week     01/17/2022   5:18 PM Oral Phase Oral Phase [facility]    01/17/2022   5:18 PM Pharyngeal Phase Pharyngeal Phase Impaired Pharyngeal- Nectar Cup Delayed swallow initiation-vallecula;Delayed swallow initiation-pyriform sinuses Pharyngeal- Thin Cup Delayed swallow initiation-pyriform sinuses;Reduced airway/laryngeal closure;Penetration/Aspiration during swallow Pharyngeal Material enters airway, passes BELOW cords without attempt by patient to  eject out (silent aspiration) Pharyngeal- Thin Straw Delayed swallow initiation-pyriform sinuses;Penetration/Aspiration during swallow;Trace aspiration Pharyngeal Material enters airway, passes BELOW cords without attempt by patient to eject out (silent aspiration) Pharyngeal- Puree Delayed swallow  initiation-vallecula Pharyngeal- Mechanical Soft WFL Pharyngeal- Pill [facility]    01/17/2022   5:20 PM Cervical Esophageal Phase  Cervical Esophageal Phase Impaired Cervical Esophageal Comment prominent cricopharyngeal bar observed which did not appear to significantly impede bolus transit Sonia Baller, MA, CCC-SLP Speech Therapy                        Signature  Lala Lund M.D on 01/21/2022 at 9:56 AM   -  To page go to [email or web address]

## 2022-01-22 LAB — CBC WITH DIFFERENTIAL/PLATELET
Abs Immature Granulocytes: 0.02 10*3/uL (ref 0.00–0.07)
Basophils Absolute: 0.1 10*3/uL (ref 0.0–0.1)
Basophils Relative: 1 %
Eosinophils Absolute: 0.4 10*3/uL (ref 0.0–0.5)
Eosinophils Relative: 5 %
HCT: 36.5 % (ref 36.0–46.0)
Hemoglobin: 12.5 g/dL (ref 12.0–15.0)
Immature Granulocytes: 0 %
Lymphocytes Relative: 12 %
Lymphs Abs: 1 10*3/uL (ref 0.7–4.0)
MCH: 30.9 pg (ref 26.0–34.0)
MCHC: 34.2 g/dL (ref 30.0–36.0)
MCV: 90.1 fL (ref 80.0–100.0)
Monocytes Absolute: 1 10*3/uL (ref 0.1–1.0)
Monocytes Relative: 11 %
Neutro Abs: 6.1 10*3/uL (ref 1.7–7.7)
Neutrophils Relative %: 71 %
Platelets: 216 10*3/uL (ref 150–400)
RBC: 4.05 MIL/uL (ref 3.87–5.11)
RDW: 12.5 % (ref 11.5–15.5)
WBC: 8.7 10*3/uL (ref 4.0–10.5)
nRBC: 0 % (ref 0.0–0.2)

## 2022-01-22 LAB — BASIC METABOLIC PANEL
Anion gap: 12 (ref 5–15)
BUN: 26 mg/dL — ABNORMAL HIGH (ref 8–23)
CO2: 34 mmol/L — ABNORMAL HIGH (ref 22–32)
Calcium: 9.3 mg/dL (ref 8.9–10.3)
Chloride: 85 mmol/L — ABNORMAL LOW (ref 98–111)
Creatinine, Ser: 1.26 mg/dL — ABNORMAL HIGH (ref 0.44–1.00)
GFR, Estimated: 43 mL/min — ABNORMAL LOW (ref >60)
Glucose, Bld: 101 mg/dL — ABNORMAL HIGH (ref 70–99)
Potassium: 4.7 mmol/L (ref 3.5–5.1)
Sodium: 131 mmol/L — ABNORMAL LOW (ref 135–145)

## 2022-01-22 LAB — BRAIN NATRIURETIC PEPTIDE: B Natriuretic Peptide: 379.7 pg/mL — ABNORMAL HIGH (ref 0.0–100.0)

## 2022-01-22 LAB — MAGNESIUM: Magnesium: 2 mg/dL (ref 1.7–2.4)

## 2022-01-22 MED ORDER — SODIUM CHLORIDE 0.9 % IV BOLUS
250.0000 mL | Freq: Once | INTRAVENOUS | Status: AC
  Administered 2022-01-22: 250 mL via INTRAVENOUS

## 2022-01-22 MED ORDER — SODIUM CHLORIDE 0.9 % IV BOLUS
500.0000 mL | Freq: Once | INTRAVENOUS | Status: AC
  Administered 2022-01-22: 500 mL via INTRAVENOUS

## 2022-01-22 MED ORDER — MIDODRINE HCL 5 MG PO TABS
10.0000 mg | ORAL_TABLET | Freq: Three times a day (TID) | ORAL | Status: DC
  Administered 2022-01-22 – 2022-01-23 (×4): 10 mg via ORAL
  Filled 2022-01-22 (×4): qty 2

## 2022-01-22 NOTE — Consult Note (Signed)
[facility] New Face-to-Face Psychiatric Evaluation   Service Date: January 22, 2022 LOS:  LOS: 7 days    Assessment  [name] is a 81 y.o. female admitted medically for 01/14/2022 11:59 PM for encephalopathy likely secondary to severe hyponatremia. She carries the psychiatric diagnoses of paranoid schizophrenia, type 1 bipolar disorder and has a past medical history of CKD stage 2, systolic heart failure, pressure injuries.Psychiatry was consulted for psychotropic medication adjustments in setting of severe hyponatremia by Salvadore Dom, NP.    She meets criteria for paranoid schizophrenia and delusional disorder based on guardian/nieces collateral information. Slept well, anxiety well controlled, and lipsmacking have resolved since starting ingrezza.  Please see plan below for detailed recommendations.   Diagnoses:  Active Hospital problems: Principal Problem:   Hyponatremia Active Problems:   Paranoid schizophrenia (HCC)   Severe manic bipolar 1 disorder with psychotic behavior (HCC)   Pressure injury of skin   SIADH (syndrome of inappropriate ADH production) (HCC)   Systolic heart failure (HCC)   CKD (chronic kidney disease), stage II     Plan  ## Safety and Observation Level:  - Based on my clinical evaluation, I estimate the patient to be at minimal risk of self harm in the current setting  ## Medications:  -- Continue risperidone 1 mg daily for paranoid schizophrenia  -Given her 30+ year history of being on risperidone and relative psychiatric stability for the past year, continuing the risperidone outweighs the risk of a contributing to SIADH.  -Qtc 516 but risk of decompensation of paranoid schizophrenia outweighs risk -- Continue mirtazapine 7.5 mg qhs. Need to balance risk for SIADH (beers criteria) vs decompensation of schizophrenia. -- Continue Ingrezza 40 mg daily for TD -- Continue melatonin 3 mg for refractory insomnia -- Minimize agents  that may contribute to SIADH but need to balance with risk of psychotic decompensation  ## Medical Decision Making Capacity:  Did not formally assess  ## Further Work-up:  -- per primary team -- most recent EKG on 10/9 had QtC of 516 -- Pertinent labwork reviewed earlier this admission includes: Na 123 (on admission 110), BNP 1425.3>1148.7.  ## Disposition:  -- ALF once medically stable  ##Legal Status Patient has guardian (heather chambers, niece)  Thank you for this consult request. Recommendations have been communicated to the primary team.  We will continue to follow at a distance at this time.   France Ravens, MD   Follow Up  Relevant Aspects of Hospital Course:  Admitted on 01/14/2022 for severe hypoosmolar hyponatremia likely secondary to SIADH.  Patient Report:  Patient reports sleeping well last night. No acute concerns today.  Reports anxiety is well controlled with current medication regimen.  Patient is alert and oriented x4.  Patient reports she is no longer lipsmacking so [name] is working well for her.  Denies SI/HI/AVH.    Collateral information:  Per chart review, no agitation  Psychiatric History:  Diagnosis of schizophrenia since age of 71, significant EPS with haldol, multiple psychiatric hospitalizations and IVC. No substance use history of note.  Social History:  Tobacco use: denies Alcohol use: denies Drug use: denies  Family History:  The patient's family history includes COPD in her father; Colon polyps in an other family member; Diabetes in her paternal aunt and paternal grandmother; Heart attack in her father; Heart failure in her mother; Hyperlipidemia in her mother; Hypertension in her father and mother; Stroke in her mother.  Medical History: Past Medical History:  Diagnosis  Date   Anemia 1990   hx of   Anxiety    Arthritis 2004   Asthma 1995   uses inhaler   Chronic kidney disease 2010   LEFT kidney removed   Colon polyp     Complication of anesthesia 1966   first surgery she had- had a hard time waking up from surgery   Delusional disorder ([place])    Six delusional disorder   Diverticulosis 2003   GERD (gastroesophageal reflux disease)    on meds   Schizophrenia [facility])     Surgical History: Past Surgical History:  Procedure Laterality Date   APPENDECTOMY  1966   CATARACT EXTRACTION     Bilateral   CHOLECYSTECTOMY  2004   COLON SURGERY  2008   COLONOSCOPY  2013   JP-MAC-moviprep(good)-TA   COLOSTOMY     COLOSTOMY TAKEDOWN     DIAGNOSTIC LAPAROSCOPY     exploratory for endometriosis   HEEL SPUR SURGERY     Right, Metal Plate in Heel   [place]  2010   NEPHRECTOMY Left 6789   As complication of partial colectomy for diverticulitis   POLYPECTOMY  2013   TA   TONSILLECTOMY     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   Procedure: LEFT TOTAL KNEE ARTHROPLASTY;  Surgeon: Susa Day, MD;  Location: [facility];  Service: Orthopedics;  Laterality: Left;  Adductor Block    Medications:   Current Facility-Administered Medications:    acetaminophen (TYLENOL) tablet 650 mg, 650 mg, Oral, Q6H PRN, Halford Chessman, Vineet, MD   albuterol (PROVENTIL) (2.5 MG/3ML) 0.083% nebulizer solution 2.5 mg, 2.5 mg, Nebulization, Q4H PRN, Halford Chessman, Vineet, MD   amantadine (SYMMETREL) capsule 100 mg, 100 mg, Oral, BID, Halford Chessman, Vineet, MD, 100 mg at 01/22/22 3810   Chlorhexidine Gluconate Cloth 2 % PADS 6 each, 6 each, Topical, Daily, Chesley Mires, MD, 6 each at 01/22/22 0913   cyanocobalamin (VITAMIN B12) tablet 1,000 mcg, 1,000 mcg, Oral, Daily, Lala Lund K, MD, 1,000 mcg at 01/22/22 0911   docusate sodium (COLACE) capsule 100 mg, 100 mg, Oral, BID PRN, Chesley Mires, MD   fluticasone (FLONASE) 50 MCG/ACT nasal spray 2 spray, 2 spray, Each Nare, Daily, Sood, Vineet, MD, 2 spray at 01/22/22 0912   gabapentin (NEURONTIN) capsule 100 mg, 100 mg, Oral, QHS, Sood, Vineet, MD, 100 mg at 01/21/22 2145   heparin injection  5,000 Units, 5,000 Units, Subcutaneous, Q8H, Sood, Vineet, MD, 5,000 Units at 01/22/22 1348   melatonin tablet 3 mg, 3 mg, Oral, QHS PRN, France Ravens, MD   midodrine (PROAMATINE) tablet 10 mg, 10 mg, Oral, TID WC, Thurnell Lose, MD   mirtazapine (REMERON) tablet 7.5 mg, 7.5 mg, Oral, QHS, France Ravens, MD, 7.5 mg at 01/21/22 2144   Oral care mouth rinse, 15 mL, Mouth Rinse, 4 times per day, Chesley Mires, MD, 15 mL at 01/22/22 0912   Oral care mouth rinse, 15 mL, Mouth Rinse, PRN, Halford Chessman, Vineet, MD   pantoprazole (PROTONIX) EC tablet 40 mg, 40 mg, Oral, Daily, Lala Lund K, MD, 40 mg at 01/22/22 0911   polyethylene glycol (MIRALAX / GLYCOLAX) packet 17 g, 17 g, Oral, Daily PRN, Chesley Mires, MD   risperiDONE (RISPERDAL) tablet 1 mg, 1 mg, Oral, QHS, Sood, Vineet, MD, 1 mg at 01/21/22 2144   sodium chloride flush (NS) 0.9 % injection 10-40 mL, 10-40 mL, Intracatheter, PRN, Chesley Mires, MD   umeclidinium-vilanterol (ANORO ELLIPTA) 62.5-25 MCG/ACT 1 puff, 1 puff, Inhalation, Daily, Chesley Mires, MD,  1 puff at 01/21/22 0832   valbenazine (INGREZZA) capsule 40 mg, 40 mg, Oral, Daily, France Ravens, MD, 40 mg at 01/22/22 2703  Allergies: Allergies  Allergen Reactions   Haloperidol Anaphylaxis   Aripiprazole Other (See Comments)    Bad thoughts    Chlorpheniramine-Pse-Ibuprofen     Other reaction(s): Cough       Objective  Vital signs:  Temp:  [97.2 F (36.2 C)-98.2 F (36.8 C)] 97.2 F (36.2 C) (10/16 0800) Pulse Rate:  [70-79] 79 (10/16 1300) Resp:  [12-18] 12 (10/16 0800) BP: (95-115)/(46-64) 95/46 (10/16 1300) SpO2:  [90 %-96 %] 90 % (10/16 0900) Weight:  [81 kg] 81 kg (10/16 0438)  Psychiatric Specialty Exam:  Presentation  General Appearance: Appropriate for Environment; Casual  Eye Contact:Good  Speech:Clear and Coherent; Normal Rate  Speech Volume:Normal  Handedness:No data recorded  Mood and Affect  Mood:Anxious  Affect:Flat   Thought Process  Thought  Processes:Coherent; Goal Directed  Descriptions of Associations:Intact  Orientation:Full (Time, Place and Person)  Thought Content:Logical  History of Schizophrenia/Schizoaffective disorder:No data recorded Duration of Psychotic Symptoms:No data recorded Hallucinations:No data recorded  Ideas of Reference:None  Suicidal Thoughts:No data recorded  Homicidal Thoughts:No data recorded   Sensorium  Memory:Immediate Good; Recent Poor; Remote Poor  Judgment:Impaired  Insight:Fair   Executive Functions  Concentration:Fair  Attention Span:Fair  [place]   Psychomotor Activity  Psychomotor Activity:No data recorded   Assets  Assets:Communication Skills; Housing; Social Support   Sleep  Sleep:No data recorded    Physical Exam: Physical Exam ROS Blood pressure (!) 95/46, pulse 79, temperature (!) 97.2 F (36.2 C), temperature source Oral, resp. rate 12, height _0  (1.575 m), weight 81 kg, SpO2 90 %. Body mass index is 32.66 kg/m.

## 2022-01-22 NOTE — Progress Notes (Signed)
Speech Language Pathology Treatment: Dysphagia  Patient Details Name: [name] MRN: [id number] DOB: March 12, 1941 Today's Date: 01/22/2022 Time: 5747-3403 SLP Time Calculation (min) (ACUTE ONLY): 10 min  Assessment / Plan / Recommendation Clinical Impression  Ms. Stotler is doing well with swallowing.  Assisted her with tray set-up and she fed herself a sandwich and drank thin liquids with no overt s/s of aspiration or obvious dysphagia. Voice was clear and strong.  Breath sounds have remained clear and pt has not had subsequent choking episodes since initial event on 10/10.  Recommend that she continue current diet. No further SLP f/u is needed at this time. Our service will sign off.   HPI HPI: Patient is a 81  y.o. female with PMH: HFrEF, GERD, HTN, anxiety, schizophrenia, CKD stage II presented to [facility] from her ALF with AMS.  Found to have severe hyponatremia and pulmonary/peripheral edema.  Transferred to [facility] on 01/15/22 for further management. On 10/10 RN observed patient to have a choking episode while eating lunch meal during which patient became blue in the face, had desaturation of oxygen into 70's with cyanosis. MD changed patient to NPO awaiting SLP to evaluate swallow function.      SLP Plan  All goals met      Recommendations for follow up therapy are one component of a multi-disciplinary discharge planning process, led by the attending physician.  Recommendations may be updated based on patient status, additional functional criteria and insurance authorization.    Recommendations  Diet recommendations: Thin liquid;Dysphagia 3 (mechanical soft) Liquids provided via: Cup;Straw Medication Administration: Whole meds with puree Supervision: Patient able to self feed;Intermittent supervision to cue for compensatory strategies Compensations: Slow rate;Small sips/bites;Minimize environmental distractions Postural Changes and/or Swallow Maneuvers: Seated upright 90 degrees                 Oral Care Recommendations: Oral care BID Follow Up Recommendations: No SLP follow up Plan: All goals met         Storie Heffern L. Tivis Ringer, MA CCC/SLP Clinical Specialist - Acute Care SLP Acute Rehabilitation Services Office number [phone]   Juan Quam Laurice  01/22/2022, 1:43 PM

## 2022-01-22 NOTE — Progress Notes (Signed)
PROGRESS NOTE                                                                                                                                                                                                             Patient Demographics:    [name], is a 81 y.o. female, DOB - 1940-06-21, WJX:[id number]  Outpatient Primary MD for the patient is Lubertha Sayres, FNP    LOS - 7  Admit date - 01/14/2022    Chief Complaint  Patient presents with   Urinary Tract Infection       Brief Narrative (HPI from H&P)   81 year old female patient with known past medical history of chronic systolic heart failure EF 25%, CKD stage II, GERD, hypertension, anxiety, diverticulosis, schizophrenia who lives at an assisted living facility who presented to [facility] from assisted care.  Presented with altered mental status, following about 2 weeks of progressive weakness.  In the emergency department was found to be short of breath, requiring supplemental oxygen, sodium was 111, BUN 10, creatinine 0.62 CT of head was negative chest x-ray demonstrating mild pulmonary edema.  Because of her symptomatic hyponatremia she was transferred to [facility] for treatment.  Admitted by PCCM required 3% normal saline administration, her sodium levels have improved since mentation has improved and she was transferred to hospitalist service on 01/19/2022.   Subjective:   Patient in bed, appears comfortable, denies any headache, no fever, no chest pain or pressure, no shortness of breath , no abdominal pain. No focal weakness.   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  She has been diuresed also required 3% normal saline in ICU, sodium levels have improved, mentation is close to baseline, no headache or focal deficits.  Continue to 1.2 L  fluid restriction per day, as needed diuresis.  No further diuresis on 01/22/2022 as creatinine has bumped slightly.  Acute on chronic systolic CHF.  EF known to be around 20%.  Blood pressure low, not on ACE/ARB or beta-blocker due to hypotension, diuretics as tolerated by blood pressure and renal function, continue to monitor.  Mild AKI on 01/22/2022.  Hold further diuretics and nephrotoxins.    HX of bipolar disorder with psychotic behavior, now diagnosed with  schizophrenia and delusional disorder per psych.  Psych monitoring and managing medications.  Dysphagia.  Speech following dysphagia 3 diet.  COPD.  No wheezing.  Supportive care.  Try to titrate off oxygen.    Weakness and deconditioning.  PT OT.  May require SNF.  Hypomagnesemia.  Replaced.  Question underlying CKD stage II.  Currently creatinine within normal limits.      Condition - Fair  Family Communication  :  None present  Code Status :  Full  Consults  :  PCCM  PUD Prophylaxis :    Procedures  :     Right arm PICC line placed 01/15/2022 - 01/20/23       Disposition Plan  :    Status is: Inpatient  DVT Prophylaxis  :    heparin injection 5,000 Units Start: 01/15/22 1400   Lab Results  Component Value Date   PLT 216 01/22/2022    Diet :  Diet Order             DIET DYS 3 Room service appropriate? Yes; Fluid consistency: Thin; Fluid restriction: 1200 mL Fluid  Diet effective now                    Inpatient Medications  Scheduled Meds:  amantadine  100 mg Oral BID   Chlorhexidine Gluconate Cloth  6 each Topical Daily   vitamin B-12  1,000 mcg Oral Daily   fluticasone  2 spray Each Nare Daily   gabapentin  100 mg Oral QHS   heparin  5,000 Units Subcutaneous Q8H   mirtazapine  7.5 mg Oral QHS   mouth rinse  15 mL Mouth Rinse 4 times per day   pantoprazole  40 mg Oral Daily   risperiDONE  1 mg Oral QHS   umeclidinium-vilanterol  1 puff Inhalation Daily   valbenazine  40 mg Oral  Daily   Continuous Infusions:   PRN Meds:.acetaminophen, albuterol, docusate sodium, melatonin, mouth rinse, polyethylene glycol, sodium chloride flush  Antibiotics  :    Anti-infectives (From admission, onward)    Start     Dose/Rate Route Frequency Ordered Stop   01/15/22 0230  cefTRIAXone (ROCEPHIN) 1 g in sodium chloride 0.9 % 100 mL IVPB        1 g 200 mL/hr over 30 Minutes Intravenous  Once 01/15/22 0221 01/15/22 0309         Objective:   Vitals:   01/22/22 0400 01/22/22 0438 01/22/22 0800 01/22/22 0900  BP: (!) 100/54  (!) 105/54   Pulse: 74  72   Resp: 14  12   Temp: 97.9 F (36.6 C)  (!) 97.2 F (36.2 C)   TempSrc: Oral  Oral   SpO2: 96%  90% 90%  Weight:  81 kg    Height:        Wt Readings from Last 3 Encounters:  01/22/22 81 kg  01/09/22 83.6 kg  12/19/21 81.2 kg     Intake/Output Summary (Last 24 hours) at 01/22/2022 1011 Last data filed at 01/22/2022 0300 Gross per 24 hour  Intake --  Output 650 ml  Net -650 ml     Physical Exam  Awake Alert, No new F.N deficits, Normal affect [place].AT,PERRAL Supple Neck, No JVD,   Symmetrical Chest wall movement, Good air movement bilaterally, CTAB RRR,No Gallops, Rubs or new Murmurs,  +ve B.Sounds, Abd Soft, No tenderness,   No Cyanosis, Clubbing or edema     RN pressure injury documentation: Pressure  Injury 01/15/22 Sacrum Medial Stage 1 -  Intact skin with non-blanchable redness of a localized area usually over a bony prominence. (Active)  01/15/22 1302  Location: Sacrum  Location Orientation: Medial  Staging: Stage 1 -  Intact skin with non-blanchable redness of a localized area usually over a bony prominence.  Wound Description (Comments):   Present on Admission: Yes  Dressing Type Foam - Lift dressing to assess site every shift 01/22/22 0917      Data Review:    CBC Recent Labs  Lab 01/16/22 0429 01/19/22 0519 01/20/22 0436 01/21/22 0420 01/22/22 0345  WBC 5.8 6.1 6.4 9.5 8.7  HGB  11.3* 11.6* 11.3* 13.1 12.5  HCT 33.0* 34.7* 34.6* 39.7 36.5  PLT 201 158 201 228 216  MCV 88.9 91.8 92.3 91.5 90.1  MCH 30.5 30.7 30.1 30.2 30.9  MCHC 34.2 33.4 32.7 33.0 34.2  RDW 12.0 12.2 12.4 12.3 12.5  LYMPHSABS  --  0.8 0.8 1.0 1.0  MONOABS  --  0.8 0.8 1.0 1.0  EOSABS  --  0.2 0.3 0.3 0.4  BASOSABS  --  0.1 0.1 0.0 0.1    Electrolytes Recent Labs  Lab 01/15/22 1038 01/15/22 1525 01/16/22 0429 01/16/22 0556 01/18/22 1706 01/19/22 0519 01/19/22 0639 01/20/22 0436 01/21/22 0420 01/22/22 0345  NA 112*   < > 118*   < > 125* 127*  --  129* 129* 131*  K 4.7   < > 4.1   < > 4.2 4.1  --  4.2 4.4 4.7  CL 78*   < > 80*   < > 84* 87*  --  85* 84* 85*  CO2 24   < > 28   < > 34* 33*  --  36* 36* 34*  GLUCOSE 107*   < > 85   < > 112* 100*  --  96 99 101*  BUN 8   < > 9   < > 10 9  --  11 22 26*  CREATININE 0.70   < > 0.81   < > 0.75 0.68  --  0.78 1.19* 1.26*  CALCIUM 8.5*   < > 7.8*   < > 8.2* 8.4*  --  9.2 9.1 9.3  AST 25  --   --   --   --   --   --   --   --   --   ALT 24  --   --   --   --   --   --   --   --   --   ALKPHOS 95  --   --   --   --   --   --   --   --   --   BILITOT 0.9  --   --   --   --   --   --   --   --   --   ALBUMIN 3.4*  --   --   --   --   --   --   --   --   --   MG  --   --   --   --   --   --  1.6* 2.2 2.0 2.0  PROCALCITON <0.10  --   --   --   --   --   --   --   --   --   TSH 1.121  --   --   --   --   --   --   --   --   --  BNP 1,425.3*  --  1,148.7*  --   --   --  616.7* 778.5* 660.1* 379.7*   < > = values in this interval not displayed.    Radiology Reports DG Chest Port 1 View  Result Date: 01/20/2022 CLINICAL DATA:  Asthma. EXAM: PORTABLE CHEST 1 VIEW COMPARISON:  01/17/2022 FINDINGS: 0732 hours. Low volume film. The cardio pericardial silhouette is enlarged. Interstitial markings are diffusely coarsened with chronic features. Left base atelectasis with tiny bilateral pleural effusions. Telemetry leads overlie the chest. IMPRESSION:  Low volume film with left base atelectasis and tiny bilateral pleural effusions. Electronically Signed   By: Misty Stanley M.D.   On: 01/20/2022 09:43      Signature  Lala Lund M.D on 01/22/2022 at 10:11 AM   -  To page go to [email or web address]

## 2022-01-22 NOTE — Progress Notes (Signed)
Post 250 bolus   01/22/22 1400  Vitals  BP (!) 88/61  MAP (mmHg) 70  BP Location Left Arm  BP Method Automatic  Patient Position (if appropriate) Lying  ECG Heart Rate 80  Resp 18  MEWS COLOR  MEWS Score Color Green  MEWS Score  MEWS Temp 0  MEWS Systolic 1  MEWS Pulse 0  MEWS RR 0  MEWS LOC 0  MEWS Score 1

## 2022-01-22 NOTE — Progress Notes (Signed)
Mobility Specialist Progress Note:   01/22/22 1528  Mobility  Activity Contraindicated/medical hold   Pt inappropriate for mobility specialist today d/t precautions as advised by nursing team. Mobility specialist to hold today, will continue to follow for readiness.   Pinky Ravan Mobility Specialist-Acute Rehab Secure Chat only

## 2022-01-22 NOTE — Progress Notes (Signed)
MD notified  01/22/22 1300  Vitals  BP (!) 95/46 (post bolus)  MAP (mmHg) (!) 60  BP Location Left Arm  BP Method Automatic  Patient Position (if appropriate) Lying  Pulse Rate 79  Level of Consciousness  Level of Consciousness Alert  MEWS COLOR  MEWS Score Color Yellow  MEWS Score  MEWS Temp 0  MEWS Systolic 1  MEWS Pulse 0  MEWS RR 1  MEWS LOC 0  MEWS Score 2

## 2022-01-22 NOTE — Progress Notes (Signed)
Physical Therapy Treatment Patient Details Name: [name] MRN: [id number] DOB: 1940-09-21 Today's Date: 01/22/2022   History of Present Illness 81 yo female admitted 01/15/22 with AMS, weakness, encephalopathy with severe hyponatremia. PMHx: paranoid schizophrenia, bipolar disorder, CKD, CHF, solitary Rt kidney, Lt TKA    PT Comments    Pt with low BP all day but on entry 103/60 and agreeable to sit EoB with therapy for seated exercise. Pt requires modA for coming to [place]. No reports of dizziness and BP 111/70. Performed seated exercise. Requested pt stand with therapy and take steps to [facility], pt stood with min A however when she began stepping to HoB experienced bilateral knee buckling requiring modA for steadying. Pt returned to supine for RN to don compression hose. D/c plans remain appropriate given decreased ability to ambulate today. PT will continue to follow acutely.   Recommendations for follow up therapy are one component of a multi-disciplinary discharge planning process, led by the attending physician.  Recommendations may be updated based on patient status, additional functional criteria and insurance authorization.  Follow Up Recommendations  Skilled nursing-short term rehab (<3 hours/day)     Assistance Recommended at Discharge Intermittent Supervision/Assistance  Patient can return home with the following A little help with walking and/or transfers;A little help with bathing/dressing/bathroom;Help with stairs or ramp for entrance;Assist for transportation;Assistance with cooking/housework   Equipment Recommendations  Rolling walker (2 wheels)       Precautions / Restrictions Precautions Precautions: Fall Restrictions Weight Bearing Restrictions: No     Mobility  Bed Mobility Overal bed mobility: Needs Assistance Bed Mobility: Supine to Sit     Supine to sit: Min assist, Mod assist     General bed mobility comments: min A for trunk support and modA for  scooting EOB    Transfers Overall transfer level: Needs assistance Equipment used: Rolling walker (2 wheels) Transfers: Sit to/from Stand, Bed to chair/wheelchair/BSC Sit to Stand: Min assist           General transfer comment: min A for power up to RW    Ambulation/Gait Ambulation/Gait assistance: Min assist, Mod assist Gait Distance (Feet): 2 Feet Assistive device: Rolling walker (2 wheels) Gait Pattern/deviations: Shuffle, Knees buckling Gait velocity: decreased Gait velocity interpretation: <1.31 ft/sec, indicative of household ambulator   General Gait Details: min A progressing to modA for stepping 3 feet towards HoB as pt with onset of bilateral knee buckling after 2 steps      Balance Overall balance assessment: Needs assistance Sitting-balance support: Feet supported, Bilateral upper extremity supported Sitting balance-Leahy Scale: Good Sitting balance - Comments: pt not reliant on UE support but prefers it for steadying due to fear of falling   Standing balance support: Reliant on assistive device for balance Standing balance-Leahy Scale: Poor Standing balance comment: reliant on RW for upright static balance                            Cognition Arousal/Alertness: Awake/alert Behavior During Therapy: WFL for tasks assessed/performed Overall Cognitive Status: No family/caregiver present to determine baseline cognitive functioning Area of Impairment: Attention, Memory, Safety/judgement, Awareness                   Current Attention Level: Sustained Memory: Decreased short-term memory   Safety/Judgement: Decreased awareness of deficits Awareness: Emergent   General Comments: following commands, decreased safety.        Exercises General Exercises - Lower Extremity Long Arc  Quad: AROM, Both, 10 reps, Seated Hip Flexion/Marching: AROM, Both, 10 reps, Seated Toe Raises: AROM, Both, 10 reps, Seated Heel Raises: AROM, Both, 10 reps,  Seated    General Comments General comments (skin integrity, edema, etc.): BP low all day, 111/70 with pt sitting EoB      Pertinent Vitals/Pain Pain Assessment Pain Assessment: No/denies pain Breathing: normal Negative Vocalization: none Facial Expression: smiling or inexpressive Body Language: relaxed Consolability: no need to console PAINAD Score: 0     PT Goals (current goals can now be found in the care plan section) Acute Rehab PT Goals Patient Stated Goal: to go back to ALF PT Goal Formulation: With patient Time For Goal Achievement: 02/01/22 Potential to Achieve Goals: Good    Frequency    Min 3X/week      PT Plan Current plan remains appropriate       AM-PAC PT "6 Clicks" Mobility   Outcome Measure  Help needed turning from your back to your side while in a flat bed without using bedrails?: A Little Help needed moving from lying on your back to sitting on the side of a flat bed without using bedrails?: A Little Help needed moving to and from a bed to a chair (including a wheelchair)?: A Little Help needed standing up from a chair using your arms (e.g., wheelchair or bedside chair)?: A Little Help needed to walk in hospital room?: A Little Help needed climbing 3-5 steps with a railing? : A Lot 6 Click Score: 17    End of Session Equipment Utilized During Treatment: Gait belt;Oxygen Activity Tolerance: Patient tolerated treatment well Patient left: with call bell/phone within reach;with chair alarm set;in bed Nurse Communication: Mobility status;Other (comment) (knee buckling) PT Visit Diagnosis: Other abnormalities of gait and mobility (R26.89);Muscle weakness (generalized) (M62.81);Unsteadiness on feet (R26.81)     Time: 4081-4481 PT Time Calculation (min) (ACUTE ONLY): 28 min  Charges:  $Therapeutic Exercise: 8-22 mins $Therapeutic Activity: 8-22 mins                     Karys Meckley B. Migdalia Dk PT, DPT Acute Rehabilitation Services Please use  secure chat or  Call Office [phone]    [place] 01/22/2022, 4:34 PM

## 2022-01-23 ENCOUNTER — Other Ambulatory Visit (HOSPITAL_COMMUNITY): Payer: Self-pay

## 2022-01-23 LAB — CBC WITH DIFFERENTIAL/PLATELET
Abs Immature Granulocytes: 0.03 10*3/uL (ref 0.00–0.07)
Basophils Absolute: 0.1 10*3/uL (ref 0.0–0.1)
Basophils Relative: 1 %
Eosinophils Absolute: 0.4 10*3/uL (ref 0.0–0.5)
Eosinophils Relative: 6 %
HCT: 35.2 % — ABNORMAL LOW (ref 36.0–46.0)
Hemoglobin: 11.7 g/dL — ABNORMAL LOW (ref 12.0–15.0)
Immature Granulocytes: 0 %
Lymphocytes Relative: 16 %
Lymphs Abs: 1.3 10*3/uL (ref 0.7–4.0)
MCH: 30.6 pg (ref 26.0–34.0)
MCHC: 33.2 g/dL (ref 30.0–36.0)
MCV: 92.1 fL (ref 80.0–100.0)
Monocytes Absolute: 1 10*3/uL (ref 0.1–1.0)
Monocytes Relative: 12 %
Neutro Abs: 5.2 10*3/uL (ref 1.7–7.7)
Neutrophils Relative %: 65 %
Platelets: 202 10*3/uL (ref 150–400)
RBC: 3.82 MIL/uL — ABNORMAL LOW (ref 3.87–5.11)
RDW: 12.6 % (ref 11.5–15.5)
WBC: 8 10*3/uL (ref 4.0–10.5)
nRBC: 0 % (ref 0.0–0.2)

## 2022-01-23 LAB — BASIC METABOLIC PANEL
Anion gap: 11 (ref 5–15)
BUN: 31 mg/dL — ABNORMAL HIGH (ref 8–23)
CO2: 28 mmol/L (ref 22–32)
Calcium: 8.9 mg/dL (ref 8.9–10.3)
Chloride: 93 mmol/L — ABNORMAL LOW (ref 98–111)
Creatinine, Ser: 1.23 mg/dL — ABNORMAL HIGH (ref 0.44–1.00)
GFR, Estimated: 45 mL/min — ABNORMAL LOW (ref >60)
Glucose, Bld: 95 mg/dL (ref 70–99)
Potassium: 4.8 mmol/L (ref 3.5–5.1)
Sodium: 132 mmol/L — ABNORMAL LOW (ref 135–145)

## 2022-01-23 LAB — BRAIN NATRIURETIC PEPTIDE: B Natriuretic Peptide: 229.6 pg/mL — ABNORMAL HIGH (ref 0.0–100.0)

## 2022-01-23 LAB — MAGNESIUM: Magnesium: 2 mg/dL (ref 1.7–2.4)

## 2022-01-23 MED ORDER — SODIUM CHLORIDE 0.9 % IV BOLUS
500.0000 mL | Freq: Once | INTRAVENOUS | Status: AC
  Administered 2022-01-23: 500 mL via INTRAVENOUS

## 2022-01-23 MED ORDER — MIRTAZAPINE 7.5 MG PO TABS
7.5000 mg | ORAL_TABLET | Freq: Every day | ORAL | 0 refills | Status: AC
  Filled 2022-01-23: qty 30, 30d supply, fill #0

## 2022-01-23 MED ORDER — VALBENAZINE TOSYLATE 40 MG PO CAPS: 40.0000 mg | ORAL_CAPSULE | Freq: Every day | ORAL | 0 refills | Status: AC

## 2022-01-23 MED ORDER — MIDODRINE HCL 5 MG PO TABS
5.0000 mg | ORAL_TABLET | Freq: Two times a day (BID) | ORAL | 0 refills | Status: DC
  Filled 2022-01-23: qty 60, 30d supply, fill #0

## 2022-01-23 NOTE — NC FL2 (Addendum)
[place] LEVEL OF CARE SCREENING TOOL     IDENTIFICATION  Patient Name: [name] Birthdate: [birth year] Sex: female Admission Date (Current Location): 01/14/2022  [facility] and [place] Number:  [employer] and Address:  [facility] [place], [place] [address] [place], [place] [address]      Provider Number: [id number]  Attending Physician Name and Address:  Thurnell Lose, MD  Relative Name and Phone Number:       Current Level of Care: Hospital Recommended Level of Care: Assisted Living Facility Prior Approval Number:    Date Approved/Denied:   PASRR Number:    Discharge Plan: Other (Comment) (ALF)    Current Diagnoses: Patient Active Problem List   Diagnosis Date Noted   SIADH (syndrome of inappropriate ADH production) ([place]) 51/76/1607   Systolic heart failure ([place]) 01/16/2022   CKD (chronic kidney disease), stage II 01/16/2022   Hyponatremia 01/15/2022   Pressure injury of skin 01/15/2022   hyperthyroidism  37/01/6268   Acute metabolic encephalopathy - RESOLVED 10/24/2021   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   Elevated troponin 10/24/2021   Pain due to onychomycosis of toenails of both feet 09/12/2020   Chronic low back pain without sciatica 10/02/2017   Essential hypertension 10/02/2017   Paranoid schizophrenia ([place]) 09/19/2017   Delusional disorder, 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[place]) 09/18/2017   Behavioral change 09/18/2017   Severe manic bipolar 1 disorder with psychotic behavior ([place]) 08/01/2017   History of left inguinal hernia repair 08/06/2016   H/O total knee replacement, left 06/19/2016   Primary osteoarthritis of left knee 04/04/2016   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/2016   Anxiety 11/15/2015   Gastroesophageal reflux disease without esophagitis 11/15/2015   Solitary right kidney 07/07/2015   Urge incontinence of urine 07/07/2015   Asthma 12/07/2011   Arthritis 12/07/2011   Thoracic spondylosis - mild to moderate 2009 12/07/2011    Obesity  12/07/2011   Incisional hernia 03/29/2011   DIVERTICULOSIS-COLON 12/01/2008   ABDOMINAL PAIN-RLQ 12/01/2008    Orientation RESPIRATION BLADDER Height & Weight     Self, Time, Situation, Place  Normal Incontinent Weight: 178 lb 6 oz (80.9 kg) Height:  '5\' 2"'$  (157.5 cm)  BEHAVIORAL SYMPTOMS/MOOD NEUROLOGICAL BOWEL NUTRITION STATUS      Incontinent Diet (mechanical soft; fluid restriction per DC Summary)  AMBULATORY STATUS COMMUNICATION OF NEEDS Skin   Limited Assist Verbally PU Stage and Appropriate Care (Stage I on sacrum)                       Personal Care Assistance Level of Assistance  Bathing, Feeding, Dressing Bathing Assistance: Limited assistance Feeding assistance: Limited assistance Dressing Assistance: Limited assistance     Functional Limitations Info             Green  PT (By licensed PT), OT (By licensed OT)     PT Frequency: Home Health 2x/week OT Frequency: Home Health 2x/week            Contractures Contractures Info: Not present    Additional Factors Info  Code Status, Allergies Code Status Info: Full Allergies Info: Haloperidol, Aripiprazole, Chlorpheniramine-pse-ibuprofen           Current Medications (01/23/2022):    Discharge Medications:  escitalopram 10 MG tablet Commonly known as: LEXAPRO    traZODone 150 MG tablet Commonly known as: DESYREL           TAKE these medications     acetaminophen 325 MG  tablet Commonly known as: TYLENOL Take 650 mg by mouth every 6 (six) hours as needed.    amantadine 100 MG capsule Commonly known as: SYMMETREL Take 100 mg by mouth 2 (two) times daily.    antiseptic oral rinse Liqd 15 mLs by Mouth Rinse route as needed for dry mouth.    Breo Ellipta 200-25 MCG/ACT Aepb Generic drug: fluticasone furoate-vilanterol Inhale 1 puff into the lungs daily.    cetirizine 10 MG tablet Commonly known as: ZYRTEC Take 10 mg by mouth daily.     cyanocobalamin 1000 MCG tablet Take 1,000 mcg by mouth daily.    fluticasone 50 MCG/ACT nasal spray Commonly known as: FLONASE Place 2 sprays into both nostrils daily.    gabapentin 100 MG capsule Commonly known as: NEURONTIN Take 100 mg by mouth at bedtime.    melatonin 3 MG Tabs tablet Take 3 mg by mouth at bedtime.    midodrine 5 MG tablet Commonly known as: PROAMATINE Take 1 tablet (5 mg total) by mouth 2 (two) times daily with a meal.    mirtazapine 7.5 MG tablet Commonly known as: REMERON Take 1 tablet (7.5 mg total) by mouth at bedtime.    omeprazole 20 MG capsule Commonly known as: PRILOSEC Take 1 capsule (20 mg total) by mouth daily.    PreserVision AREDS Tabs Take 1 tablet by mouth in the morning and at bedtime.    risperiDONE 1 MG tablet Commonly known as: RISPERDAL Take 1 tablet (1 mg total) by mouth at bedtime.      Relevant Imaging Results:  Relevant Lab Results:   Additional Information SS# [id number]  Benard Halsted, LCSW

## 2022-01-23 NOTE — TOC Progression Note (Addendum)
Transition of Care (TOC) - Progression Note  Spoke with [facility] and faxed DC summary and FL2 to facility [phone]). Alex reported she will contact CSW once they review paperwork. MSW intern spoke with patient's niece and she requested [facility] for transport.    Expected Discharge Plan: Assisted Living Barriers to Discharge: Barriers Resolved  Expected Discharge Plan and Services Expected Discharge Plan: Assisted Living     Post Acute Care Choice: Resumption of Svcs/PTA Provider Living arrangements for the past 2 months: [place] Expected Discharge Date: 01/23/22                                     Social Determinants of Health (SDOH) Interventions    Readmission Risk Interventions     No data to display

## 2022-01-23 NOTE — TOC Transition Note (Signed)
Transition of Care [facility]) - CM/SW Discharge Note   Patient Details  Name: [name] MRN: [id number] Date of Birth: 01-25-1941  [facility] CM/SW Contact:  Benard Halsted, LCSW Phone Number: 01/23/2022, 1:28 PM   Clinical Narrative:    Patient will DC to: [facility]F Anticipated DC date: 01/23/22 Family notified: Niece Transport by: [name]   Per MD patient ready for DC to [facility] [place]. RN to call report prior to discharge [phone]). RN, patient, patient's family, and facility notified of DC. Discharge Summary and FL2 sent to facility. DC packet on chart. [name] has been e-prescribed to [facility], facility aware. Ambulance transport requested for patient.   CSW will sign off for now as social work intervention is no longer needed. Please consult [place] again if new needs arise.     Final next level of care: Skilled Nursing Facility Barriers to Discharge: Barriers Resolved   Patient Goals and CMS Choice Patient states their goals for this hospitalization and ongoing recovery are:: Patient wants to return to ALF. CMS Medicare.gov Compare Post Acute Care list provided to:: Patient Choice offered to / list presented to : [name] / [name]  Discharge Placement              Patient chooses bed at: [name] and [name] Patient to be transferred to facility by: [name] Name of family member notified: Niece Patient and family notified of of transfer: 01/23/22  Discharge Plan and Services     Post Acute Care Choice: Resumption of Svcs/PTA Provider                               Social Determinants of Health (SDOH) Interventions     Readmission Risk Interventions     No data to display

## 2022-01-23 NOTE — Discharge Instructions (Signed)
Follow with Primary MD Elizabeth Sayres, FNP in 7 days, also follow-up with your audiologist and psychiatrist within 7 to 10 days.  Get CBC, CMP, 2 view Chest X ray -  checked next visit within 1 week by Primary MD   Activity: As tolerated with Full fall precautions use walker/cane & assistance as needed  Disposition ALF  Diet: Dysphagia 3 diet with strict 1.2 L fluid restriction per day with feeding assistance and aspiration precautions.  Special Instructions: If you have smoked or chewed Tobacco  in the last 2 yrs please stop smoking, stop any regular Alcohol  and or any Recreational drug use.  On your next visit with your primary care physician please Get Medicines reviewed and adjusted.  Please request your Prim.MD to go over all Hospital Tests and Procedure/Radiological results at the follow up, please get all Hospital records sent to your Prim MD by signing hospital release before you go home.  If you experience worsening of your admission symptoms, develop shortness of breath, life threatening emergency, suicidal or homicidal thoughts you must seek medical attention immediately by calling 911 or calling your MD immediately  if symptoms less severe.  You Must read complete instructions/literature along with all the possible adverse reactions/side effects for all the Medicines you take and that have been prescribed to you. Take any new Medicines after you have completely understood and accpet all the possible adverse reactions/side effects.

## 2022-01-23 NOTE — Discharge Summary (Signed)
[name] QPY:[id number] DOB: [birth year] DOA: 01/14/2022  PCP: Lubertha Sayres, FNP  Admit date: 01/14/2022  Discharge date: 01/23/2022  Admitted From: ALF   Disposition:  ALF   Recommendations for Outpatient Follow-up:   Follow up with PCP in 1-2 weeks  PCP Please obtain BMP/CBC, 2 view CXR in 1week,  (see Discharge instructions)   PCP Please follow up on the following pending results: Monitor BMP closely, close outpatient follow-up with your cardiologist and psychiatrist within a week.  1.2 L strict fluid restriction per day.   Home Health: PT, RN Equipment/Devices: as below Consultations: Psychiatry, PCCM Discharge Condition: Stable    CODE STATUS: Full    Diet Recommendation: Daja 3 diet with 1.2 L strict fluid restriction per day   Chief Complaint  Patient presents with   Urinary Tract Infection     Brief history of present illness from the day of admission and additional interim summary    81 year old female patient with known past medical history of chronic systolic heart failure EF 25%, CKD stage II, GERD, hypertension, anxiety, diverticulosis, schizophrenia who lives at an assisted living facility who presented to [facility] from assisted care.  Presented with altered mental status, following about 2 weeks of progressive weakness.  In the emergency department was found to be short of breath, requiring supplemental oxygen, sodium was 111, BUN 10, creatinine 0.62 CT of head was negative chest x-ray demonstrating mild pulmonary edema.  Because of her symptomatic hyponatremia she was transferred to [facility] for treatment.  Admitted by PCCM required 3% normal saline administration, her sodium levels have improved since mentation has improved and she was transferred to hospitalist service on  01/19/2022.                                                                 Hospital Course   Metabolic encephalopathy caused due to severe hyponatremia in the setting of SIADH and initially likely some CHF decompensation.  She has been diuresed also required 3% normal saline in ICU, sodium levels have improved, mentation is close to baseline, no headache or focal deficits.  Continue to 1.2 L fluid restriction per day, sodium improving with fluid restriction, she has been strictly counseled to restrict her fluid intake to 1.2 L.  PCP to monitor this closely.   Acute on chronic systolic CHF.  EF known to be around 20%.  Blood pressure low, not on ACE/ARB or beta-blocker due to hypotension, diuretics as tolerated by blood pressure and renal function, continue to monitor.  Blood pressure stable on low-dose midodrine.  She is symptom-free.   Mild AKI on 01/22/2022 questionable CKD 3A.  Hold further diuretics and nephrotoxins.  Fattening has plateaued around 1.2, symptom-free PCP to monitor BMP closely.   HX of bipolar disorder with psychotic  behavior, now diagnosed with schizophrenia and delusional disorder per psych.  Psych monitoring and managing medications.  Theatric team will order Ingrezza directly to her pharmacy in addition to the medications below.   Dysphagia.  Speech following dysphagia 3 diet.   COPD.  No wheezing.  Able with supportive care currently 92% on room air and symptom-free.   Weakness and deconditioning.  PT OT.  Will get the same at ALF.   Hypomagnesemia.  Replaced.     Discharge diagnosis     Principal Problem:   Hyponatremia Active Problems:   SIADH (syndrome of inappropriate ADH production) (HCC)   Systolic heart failure (HCC)   CKD (chronic kidney disease), stage II   Paranoid schizophrenia (HCC)   Severe manic bipolar 1 disorder with psychotic behavior ([place])   Pressure injury of skin    Discharge instructions    Discharge Instructions     Discharge  instructions   Complete by: As directed    Follow with Primary MD Lubertha Sayres, FNP in 7 days, also follow-up with your audiologist and psychiatrist within 7 to 10 days.  Get CBC, CMP, 2 view Chest X ray -  checked next visit within 1 week by Primary MD   Activity: As tolerated with Full fall precautions use walker/cane & assistance as needed  Disposition ALF  Diet: Dysphagia 3 diet with strict 1.2 L fluid restriction per day with feeding assistance and aspiration precautions.  Special Instructions: If you have smoked or chewed Tobacco  in the last 2 yrs please stop smoking, stop any regular Alcohol  and or any Recreational drug use.  On your next visit with your primary care physician please Get Medicines reviewed and adjusted.  Please request your Prim.MD to go over all Hospital Tests and Procedure/Radiological results at the follow up, please get all Hospital records sent to your Prim MD by signing hospital release before you go home.  If you experience worsening of your admission symptoms, develop shortness of breath, life threatening emergency, suicidal or homicidal thoughts you must seek medical attention immediately by calling 911 or calling your MD immediately  if symptoms less severe.  You Must read complete instructions/literature along with all the possible adverse reactions/side effects for all the Medicines you take and that have been prescribed to you. Take any new Medicines after you have completely understood and accpet all the possible adverse reactions/side effects.   Increase activity slowly   Complete by: As directed    No wound care   Complete by: As directed        Discharge Medications   Allergies as of 01/23/2022       Reactions   Haloperidol Anaphylaxis   Aripiprazole Other (See Comments)   Bad thoughts    Chlorpheniramine-pse-ibuprofen    Other reaction(s): Cough        Medication List     STOP taking these medications    escitalopram 10 MG  tablet Commonly known as: LEXAPRO   traZODone 150 MG tablet Commonly known as: DESYREL       TAKE these medications    acetaminophen 325 MG tablet Commonly known as: TYLENOL Take 650 mg by mouth every 6 (six) hours as needed.   amantadine 100 MG capsule Commonly known as: SYMMETREL Take 100 mg by mouth 2 (two) times daily.   antiseptic oral rinse Liqd 15 mLs by Mouth Rinse route as needed for dry mouth.   Breo Ellipta 200-25 MCG/ACT Aepb Generic drug: fluticasone furoate-vilanterol Inhale 1 puff  into the lungs daily.   cetirizine 10 MG tablet Commonly known as: ZYRTEC Take 10 mg by mouth daily.   cyanocobalamin 1000 MCG tablet Take 1,000 mcg by mouth daily.   fluticasone 50 MCG/ACT nasal spray Commonly known as: FLONASE Place 2 sprays into both nostrils daily.   gabapentin 100 MG capsule Commonly known as: NEURONTIN Take 100 mg by mouth at bedtime.   melatonin 3 MG Tabs tablet Take 3 mg by mouth at bedtime.   midodrine 5 MG tablet Commonly known as: PROAMATINE Take 1 tablet (5 mg total) by mouth 2 (two) times daily with a meal.   mirtazapine 7.5 MG tablet Commonly known as: REMERON Take 1 tablet (7.5 mg total) by mouth at bedtime.   omeprazole 20 MG capsule Commonly known as: PRILOSEC Take 1 capsule (20 mg total) by mouth daily.   PreserVision AREDS Tabs Take 1 tablet by mouth in the morning and at bedtime.   risperiDONE 1 MG tablet Commonly known as: RISPERDAL Take 1 tablet (1 mg total) by mouth at bedtime.         Follow-up Information     Lubertha Sayres, FNP. Schedule an appointment as soon as possible for a visit in 1 week(s).   Specialty: Family Medicine Why: You must also follow-up with your cardiologist and psychiatrist within 7 to 10 days Contact information: [address] [place] [address] [phone]                 Major procedures and Radiology Reports - PLEASE review detailed and final reports thoroughly  -       DG  Chest Port 1 View  Result Date: 01/20/2022 CLINICAL DATA:  Asthma. EXAM: PORTABLE CHEST 1 VIEW COMPARISON:  01/17/2022 FINDINGS: 0732 hours. Low volume film. The cardio pericardial silhouette is enlarged. Interstitial markings are diffusely coarsened with chronic features. Left base atelectasis with tiny bilateral pleural effusions. Telemetry leads overlie the chest. IMPRESSION: Low volume film with left base atelectasis and tiny bilateral pleural effusions. Electronically Signed   By: Misty Stanley M.D.   On: 01/20/2022 09:43   DG Swallowing Func-Speech Pathology  Result Date: 01/17/2022 Table formatting from the original result was not included. Objective Swallowing Evaluation: Type of Study: MBS-Modified Barium Swallow Study  Patient Details Name: [name] MRN: [id number] Date of Birth: Apr 24, 1940 Today's Date: 01/17/2022 Time: SLP Start Time (ACUTE ONLY): 1340 -SLP Stop Time (ACUTE ONLY): 1400 SLP Time Calculation (min) (ACUTE ONLY): 20 min Past Medical History: Past Medical History: Diagnosis Date  Anemia 1990  hx of  Anxiety   Arthritis 2004  Asthma 1995  uses inhaler  Chronic kidney disease 2010  LEFT kidney removed  Colon polyp   Complication of anesthesia 1966  first surgery she had- had a hard time waking up from surgery  Delusional disorder ([place])   Six delusional disorder  Diverticulosis 2003  GERD (gastroesophageal reflux disease)   on meds  Schizophrenia [facility])  Past Surgical History: Past Surgical History: Procedure Laterality Date  APPENDECTOMY  1966  CATARACT EXTRACTION    Bilateral  CHOLECYSTECTOMY  2004  COLON SURGERY  2008  COLONOSCOPY  2013  JP-MAC-moviprep(good)-TA  COLOSTOMY    COLOSTOMY TAKEDOWN    DIAGNOSTIC LAPAROSCOPY    exploratory for endometriosis  HEEL SPUR SURGERY    Right, Metal Plate in Heel  [place]  2010  NEPHRECTOMY Left 9798  As complication of partial colectomy for diverticulitis  POLYPECTOMY  2013  TA  TONSILLECTOMY  TOTAL KNEE ARTHROPLASTY Left  04/04/2016  Procedure: LEFT TOTAL KNEE ARTHROPLASTY;  Surgeon: Susa Day, MD;  Location: [facility] ORS;  Service: Orthopedics;  Laterality: Left;  Adductor Block HPI: Patient is a 81  y.o. female with PMH: HFrEF, GERD, HTN, anxiety, schizophrenia, CKD stage II presented to [facility] from her ALF with AMS.  Found to have severe hyponatremia and pulmonary/peripheral edema.  Transferred to [facility] on 01/15/22 for further management. On 10/10 RN observed patient to have a choking episode while eating lunch meal during which patient became blue in the face, had desaturation of oxygen into 70's with cyanosis. MD changed patient to NPO awaiting SLP to evaluate swallow function.  Subjective: pleasant, alert  Recommendations for follow up therapy are one component of a multi-disciplinary discharge planning process, led by the attending physician.  Recommendations may be updated based on patient status, additional functional criteria and insurance authorization. Assessment / Plan / Recommendation   01/17/2022   5:21 PM Clinical Impressions Clinical Impression Patient presents with a mild pharyngeal phase dysphagia but with oral phase of swallow that appears [facility]. During pharyngeal phase, patient exhibited swallow intiation delays at level of vallecular sinus with puree solids nectar thick liquids and swallow initiation delays to level of pyriform with thin liquids (intermittently with nectar thick liquids). Trace, silent aspiration occured with thin liquids during the swallow when patient taking large sips and tilting head backwards. When patient kept head in neutral position and took small to average sized sips, no aspiration observed. Only trace vallecular and pyriform sinus residuals observed with thin liquids however subsequent swallows cleared residuals fully. Barium tablet taken with puree solids and soft solid textures both transited pharyngeally and through cervical esophagus without difficulty. SLP did not observe any swallow  impairment during this MBS that would explain her recent choking event with solid texture PO's. Recommendation is for Dys 3 (mechanical soft)  solids and thin liquids. In addition, it is recommended that patient sit upright in bed or preferably, recliner chair during PO intake. SLP Visit Diagnosis Dysphagia, unspecified (R13.10) Impact on safety and function Mild aspiration risk     01/17/2022   5:21 PM Treatment Recommendations Treatment Recommendations Therapy as outlined in treatment plan below     01/17/2022   5:28 PM Prognosis Prognosis for Safe Diet Advancement Good   01/17/2022   5:21 PM Diet Recommendations SLP Diet Recommendations Dysphagia 3 (Mech soft) solids;Thin liquid Liquid Administration via Straw;Cup Medication Administration Whole meds with puree Compensations Slow rate;Small sips/bites;Minimize environmental distractions Postural Changes Seated upright at 90 degrees     01/17/2022   5:21 PM Other Recommendations Oral Care Recommendations Oral care BID Follow Up Recommendations Follow physician's recommendations for discharge plan and follow up therapies Assistance recommended at discharge Frequent or constant Supervision/Assistance Functional Status Assessment Patient has had a recent decline in their functional status and demonstrates the ability to make significant improvements in function in a reasonable and predictable amount of time.   01/17/2022   5:21 PM Frequency and Duration  Speech Therapy Frequency (ACUTE ONLY) min 2x/week Treatment Duration 1 week     01/17/2022   5:18 PM Oral Phase Oral Phase [facility]    01/17/2022   5:18 PM Pharyngeal Phase Pharyngeal Phase Impaired Pharyngeal- Nectar Cup Delayed swallow initiation-vallecula;Delayed swallow initiation-pyriform sinuses Pharyngeal- Thin Cup Delayed swallow initiation-pyriform sinuses;Reduced airway/laryngeal closure;Penetration/Aspiration during swallow Pharyngeal Material enters airway, passes BELOW cords without attempt by patient to  eject out (silent aspiration) Pharyngeal- Thin Straw Delayed swallow initiation-pyriform sinuses;Penetration/Aspiration during  swallow;Trace aspiration Pharyngeal Material enters airway, passes BELOW cords without attempt by patient to eject out (silent aspiration) Pharyngeal- Puree Delayed swallow initiation-vallecula Pharyngeal- Mechanical Soft WFL Pharyngeal- Pill [facility]    01/17/2022   5:20 PM Cervical Esophageal Phase  Cervical Esophageal Phase Impaired Cervical Esophageal Comment prominent cricopharyngeal bar observed which did not appear to significantly impede bolus transit Sonia Baller, MA, CCC-SLP Speech Therapy                     DG Chest Port 1 View  Result Date: 01/17/2022 CLINICAL DATA:  47654 CHF (congestive heart failure) ([place]) [address]35 EXAM: PORTABLE CHEST 1 VIEW COMPARISON:  January 15, 2022 FINDINGS: The cardiomediastinal silhouette is unchanged and enlarged in contour.RIGHT upper extremity PICC with tip terminating over the superior cavoatrial junction. Favored small bilateral pleural effusions. No pneumothorax. Perihilar vascular fullness, peribronchial cuffing and interstitial prominence, similar in comparison to prior. Visualized abdomen is unremarkable. IMPRESSION: 1.  Support apparatus as described above. 2. Pulmonary edema with small bilateral pleural effusions and scattered atelectasis. Electronically Signed   By: Valentino Saxon M.D.   On: 01/17/2022 07:46   [place] EKG SITE RITE  Result Date: 01/15/2022 If Site Rite image not attached, placement could not be confirmed due to current cardiac rhythm.  CT Head Wo Contrast  Result Date: 01/15/2022 CLINICAL DATA:  Delirium with possible urinary tract infection. EXAM: CT HEAD WITHOUT CONTRAST TECHNIQUE: Contiguous axial images were obtained from the base of the skull through the vertex without intravenous contrast. RADIATION DOSE REDUCTION: This exam was performed according to the departmental dose-optimization program which includes  automated exposure control, adjustment of the mA and/or kV according to patient size and/or use of iterative reconstruction technique. COMPARISON:  Head CT 10/24/2021 FINDINGS: Brain: There is mild atrophy with extensive chronic small-vessel disease of the cerebral white matter, and several small scattered bilateral gangliocapsular chronic lacunar infarcts. No new asymmetry is seen worrisome for a cortical based acute infarct, hemorrhage or midline shift. No mass or mass effect are identified. There is a mildly expanded empty sella. Ventricles are normal in size and position. Relatively unremarkable cerebellum, brainstem. Vascular: There are scattered calcifications in the siphons. There are no hyperdense central vessels. Skull: Intact with hyperostosis frontalis interna. Sinuses/Orbits: There is mild membrane disease in the sinuses without fluid levels or mastoid effusion. Old lens replacements. Other: None. IMPRESSION: No acute intracranial CT findings or interval changes. Atrophy with extensive chronic small vessel disease and old central lacunae. Expanded empty sella, which may be seen with idiopathic intracranial hypertension. This was seen previously. By report was present as far back as MRI brain 07/15/2000. Carotid atherosclerosis. Electronically Signed   By: Telford Nab M.D.   On: 01/15/2022 01:25   DG Chest Port 1 View  Result Date: 01/15/2022 CLINICAL DATA:  Increased confusion. EXAM: PORTABLE CHEST 1 VIEW COMPARISON:  Chest 10/24/2021. FINDINGS: The heart is enlarged. There is increased central vascular prominence, flow cephalization and mild interstitial edema in the central and basal lungs with small pleural effusions. There are increased patchy opacities in the lung bases. The lung opacities could be atelectasis, pneumonia or edema. No other focal infiltrate is seen. The mediastinum is normally outlined. There is aortic atherosclerosis. Thoracic spondylosis. IMPRESSION: Mild CHF pattern with  central and basilar interstitial edema and small pleural effusions. Opacities in the lung bases which could be atelectasis, pneumonia or airspace edema. Progress chest films recommended depending on clinical response. Electronically Signed   By: Lanny Hurst  Chesser M.D.   On: 01/15/2022 00:51   US THYROID  Result Date: 01/08/2022 CLINICAL DATA:  Prior ultrasound follow-up. Multinodular thyroid gland. Patient previously underwent biopsy of right mid/inferior thyroid nodule in March of 2023. EXAM: THYROID ULTRASOUND TECHNIQUE: Ultrasound examination of the thyroid gland and adjacent soft tissues was performed. COMPARISON:  Prior thyroid ultrasound 06/08/2021 FINDINGS: Parenchymal Echotexture: Mildly heterogenous Isthmus: 0.4 cm Right lobe: 5.5 x 3.0 x 3.4 cm Left lobe: 5.2 x 2.3 x 1.6 cm _________________________________________________________ Estimated total number of nodules >/= 1 cm: 3 Number of spongiform nodules >/=  2 cm not described below (TR1): 0 Number of mixed cystic and solid nodules >/= 1.5 cm not described below ([place]): 0 _________________________________________________________ Nodule # 1: Stable solid nodule in the right upper gland at 2 x 1.7 x 1.8 cm compared to 2 x 1.7 x 1.7 cm previously. Nodule # 2: No significant interval change in the size or appearance of the previously biopsied mass in the right mid to lower gland which measures 3.9 x 2.4 x 3.5 cm. Nodule # 3: Cluster of mixed cystic and solid versus spongiform nodules in the left lower gland. Overall appearance is similar compared to prior imaging and does not warrant biopsy or dedicated imaging surveillance. IMPRESSION: 1. No significant interval change in the size or appearance of the previously biopsied nodule in the right mid gland. 2. Continued stability of solid nodule in the right upper gland. The present exam confirms 6 months of stability. Recommend follow-up ultrasound in 1 year. The above is in keeping with the ACR TI-RADS  recommendations - J Am Coll Radiol 2017;14:587-595. Electronically Signed   By: Jacqulynn Cadet M.D.   On: 01/08/2022 12:56    Micro Results    Recent Results (from the past 240 hour(s))  Urine Culture     Status: None   Collection Time: 01/15/22 12:31 AM   Specimen: In/Out Cath Urine  Result Value Ref Range Status   Specimen Description   Final    IN/OUT CATH URINE Performed at [facility], [address], [place], [place] [address]    Special Requests   Final    NONE Performed at [facility], [address] [place], [place] [address]    Culture   Final    NO GROWTH Performed at [facility], [place] [address] [place], [place] [address]    Report Status 01/16/2022 FINAL  Final  Blood Culture (routine x 2)     Status: None   Collection Time: 01/15/22  1:53 AM   Specimen: BLOOD LEFT HAND  Result Value Ref Range Status   Specimen Description   Final    BLOOD LEFT HAND BOTTLES DRAWN AEROBIC AND ANAEROBIC   Special Requests Blood Culture adequate volume  Final   Culture   Final    NO GROWTH 5 DAYS Performed at [facility], [address] [place], [place] [address]    Report Status 01/20/2022 FINAL  Final  Blood Culture (routine x 2)     Status: None   Collection Time: 01/15/22  1:56 AM   Specimen: BLOOD RIGHT HAND  Result Value Ref Range Status   Specimen Description   Final    BLOOD RIGHT HAND BOTTLES DRAWN AEROBIC AND ANAEROBIC   Special Requests Blood Culture adequate volume  Final   Culture   Final    NO GROWTH 5 DAYS Performed at [facility], [address] [place],  [place] [address]    Report Status 01/20/2022 FINAL  Final  MRSA Next Gen by PCR, Nasal     Status: None   Collection Time: 01/15/22  9:07 AM   Specimen: Nasal Mucosa; Nasal Swab  Result Value Ref Range Status   MRSA by PCR Next Gen NOT DETECTED NOT DETECTED Final    Comment: (NOTE) The GeneXpert MRSA Assay (FDA approved for NASAL specimens only), is one component of a comprehensive MRSA  colonization surveillance program. It is not intended to diagnose MRSA infection nor to guide or monitor treatment for MRSA infections. Test performance is not FDA approved in patients less than 48 years old. Performed at [facility], [place] [address], [place], [place] [address]     Today   Subjective    [name] today has no headache,no chest abdominal pain,no new weakness tingling or numbness, feels much better wants to go home today.    Objective   Blood pressure (!) 112/53, pulse 71, temperature 98.5 F (36.9 C), temperature source Oral, resp. rate 15, height _0  (1.575 m), weight 80.9 kg, SpO2 92 %.   Intake/Output Summary (Last 24 hours) at 01/23/2022 0914 Last data filed at 01/22/2022 1700 Gross per 24 hour  Intake 120 ml  Output 500 ml  Net -380 ml    Exam  Awake Alert, No new F.N deficits,    .AT,PERRAL Supple Neck,   Symmetrical Chest wall movement, Good air movement bilaterally, CTAB RRR,No Gallops,   +ve B.Sounds, Abd Soft, Non tender,  No Cyanosis, Clubbing or edema    Data Review   Recent Labs  Lab 01/19/22 0519 01/20/22 0436 01/21/22 0420 01/22/22 0345 01/23/22 0527  WBC 6.1 6.4 9.5 8.7 8.0  HGB 11.6* 11.3* 13.1 12.5 11.7*  HCT 34.7* 34.6* 39.7 36.5 35.2*  PLT 158 201 228 216 202  MCV 91.8 92.3 91.5 90.1 92.1  MCH 30.7 30.1 30.2 30.9 30.6  MCHC 33.4 32.7 33.0 34.2 33.2  RDW 12.2 12.4 12.3 12.5 12.6  LYMPHSABS 0.8 0.8 1.0 1.0 1.3  MONOABS 0.8 0.8 1.0 1.0 1.0  EOSABS 0.2 0.3 0.3 0.4 0.4  BASOSABS 0.1 0.1 0.0 0.1 0.1    Recent Labs  Lab 01/19/22 0519 01/19/22 0639 01/20/22 0436 01/21/22 0420 01/22/22 0345 01/23/22 0259  NA 127*  --  129* 129* 131* 132*  K 4.1  --  4.2 4.4 4.7 4.8  CL 87*  --  85* 84* 85* 93*  CO2 33*  --  36* 36* 34* 28  GLUCOSE 100*  --  96 99 101* 95  BUN 9  --  11 22 26* 31*  CREATININE 0.68  --  0.78 1.19* 1.26* 1.23*  CALCIUM 8.4*  --  9.2 9.1 9.3 8.9  MG  --  1.6* 2.2 2.0 2.0 2.0  BNP  --   616.7* 778.5* 660.1* 379.7* 229.6*    Total Time in preparing paper work, data evaluation and todays exam - 35 minutes  Lala Lund M.D on 01/23/2022 at 9:14 AM  [facility]

## 2022-01-23 NOTE — Progress Notes (Signed)
Patient discharging to [facility]F. RN removed IV and took patient off heart monitor. Called and updated facility with report. No further questions at this time.

## 2022-01-23 NOTE — Progress Notes (Signed)
Occupational Therapy Treatment Patient Details Name: [name] MRN: [id number] DOB: 1940/07/18 Today's Date: 01/23/2022   History of present illness 81 yo female admitted 01/15/22 with AMS, weakness, encephalopathy with severe hyponatremia. PMHx: paranoid schizophrenia, bipolar disorder, CKD, CHF, solitary Rt kidney, Lt TKA   OT comments  Pt progressing towards OT goals this session. BP held from 108/53 to 106/52 after transfer to recliner with min A and RW. Pt was max A to don ted hose and socks at bed level, min A to change gown and set up for grooming. POC to go to SNF post-acute to maximize safety and independence in ADL and functional transfers remains appropriate at this time. OT will continue to follow acutely.   Recommendations for follow up therapy are one component of a multi-disciplinary discharge planning process, led by the attending physician.  Recommendations may be updated based on patient status, additional functional criteria and insurance authorization.    Follow Up Recommendations  Skilled nursing-short term rehab (<3 hours/day)    Assistance Recommended at Discharge Intermittent Supervision/Assistance  Patient can return home with the following  Assist for transportation;A little help with bathing/dressing/bathroom;A little help with walking and/or transfers;Direct supervision/assist for medications management;Assistance with cooking/housework   Equipment Recommendations  Tub/shower seat    Recommendations for Other Services      Precautions / Restrictions Precautions Precautions: Fall Restrictions Weight Bearing Restrictions: No       Mobility Bed Mobility Overal bed mobility: Needs Assistance Bed Mobility: Supine to Sit     Supine to sit: Min assist     General bed mobility comments: min A for trunk elevation    Transfers Overall transfer level: Needs assistance Equipment used: Rolling walker (2 wheels) Transfers: Sit to/from Stand, Bed to  chair/wheelchair/BSC Sit to Stand: Min assist     Step pivot transfers: Min guard     General transfer comment: min A for power up to RW     Balance Overall balance assessment: Needs assistance Sitting-balance support: Feet supported, Bilateral upper extremity supported Sitting balance-Leahy Scale: Good Sitting balance - Comments: pt not reliant on UE support but prefers it for steadying due to fear of falling   Standing balance support: Reliant on assistive device for balance Standing balance-Leahy Scale: Poor Standing balance comment: reliant on RW for upright static balance                           ADL either performed or assessed with clinical judgement   ADL Overall ADL's : Needs assistance/impaired     Grooming: Wash/dry face;Wash/dry hands;Sitting;Set up Grooming Details (indicate cue type and reason): in recliner             Lower Body Dressing: Maximal assistance;Bed level Lower Body Dressing Details (indicate cue type and reason): to don ted hose and socks prior to bed mobility Toilet Transfer: Min guard;Stand-pivot;Rolling walker (2 wheels)   Toileting- Clothing Manipulation and Hygiene: Minimal assistance;Sit to/from stand Toileting - Clothing Manipulation Details (indicate cue type and reason): min A for balance and assist for rear     Functional mobility during ADLs: Min guard;Rolling walker (2 wheels)      Extremity/Trunk Assessment Upper Extremity Assessment Upper Extremity Assessment: Overall WFL for tasks assessed;Generalized weakness   Lower Extremity Assessment Lower Extremity Assessment: Defer to PT evaluation (reports that her left knee loves to buckle)        Vision       Perception  Praxis      Cognition Arousal/Alertness: Awake/alert Behavior During Therapy: WFL for tasks assessed/performed Overall Cognitive Status: No family/caregiver present to determine baseline cognitive functioning Area of Impairment:  Attention, Memory, Safety/judgement, Awareness                   Current Attention Level: Sustained Memory: Decreased short-term memory   Safety/Judgement: Decreased awareness of deficits Awareness: Emergent   General Comments: pleasant, following commands and pleasant throughout session        Exercises      Shoulder Instructions       General Comments donned ted hose and BP 108/53 and 106/52 after transfer    Pertinent Vitals/ Pain       Pain Assessment Pain Assessment: No/denies pain Pain Intervention(s): Monitored during session, Repositioned  Home Living                                          Prior Functioning/Environment              Frequency  Min 2X/week        Progress Toward Goals  OT Goals(current goals can now be found in the care plan section)  Progress towards OT goals: Progressing toward goals  Acute Rehab OT Goals Patient Stated Goal: get stronger again OT Goal Formulation: With patient Time For Goal Achievement: 02/02/22 Potential to Achieve Goals: Good  Plan Discharge plan remains appropriate    Co-evaluation                 AM-PAC OT "6 Clicks" Daily Activity     Outcome Measure   Help from another person eating meals?: None Help from another person taking care of personal grooming?: A Little Help from another person toileting, which includes using toliet, bedpan, or urinal?: A Lot Help from another person bathing (including washing, rinsing, drying)?: A Lot Help from another person to put on and taking off regular upper body clothing?: None Help from another person to put on and taking off regular lower body clothing?: A Lot 6 Click Score: 17    End of Session Equipment Utilized During Treatment: Gait belt;Rolling walker (2 wheels)  OT Visit Diagnosis: Unsteadiness on feet (R26.81);Muscle weakness (generalized) (M62.81);Other symptoms and signs involving cognitive function   Activity  Tolerance Patient tolerated treatment well   Patient Left in chair;with call bell/phone within reach;with chair alarm set   Nurse Communication Mobility status        Time: 1100-1126 OT Time Calculation (min): 26 min  Charges: OT General Charges $OT Visit: 1 Visit OT Treatments $Self Care/Home Management : 23-37 mins  Jesse Sans OTR/L Acute Rehabilitation Services Office: [place] 01/23/2022, 11:35 AM

## 2022-01-28 NOTE — Progress Notes (Deleted)
Cardiology Office Note:    Date:  01/28/2022   ID:  [name], DOB January 22, 1941, MRN [id number]  PCP:  Lubertha Sayres, FNP   [facility] Providers Cardiologist:  Fransico Him, MD { Click to update primary MD,subspecialty MD or APP then REFRESH:1}    Referring MD: Lubertha Sayres, FNP   Chief Complaint: ***  History of Present Illness:    [name] is a *** 81 y.o. female with a hx of CKD post left nephrectomy, severe LV dysfunction LVEF 25% found on echo, mitral regurgitation, anxiety and schizophrenia.   Referred to cardiology and seen by Dr. Radford Pax on 09/06/21. PCP ordered CXR which showed cardiomegaly and she was referred for echo. She reported some DOE with exertion, mainly with walking. 2D echo revealed LVEF 25% with severely dilated LV. PET cardiac CT ordered for evaluation of CAD and showed no ischemia. cMRI was ordered to rule out infiltrative process.  Placed on low-dose losartan, no SGLT2i due to recent UTI and no coronary CTA due to 1 kidney. Additional labs ordered to r/o potential causes.   Seen back in office 11/29/21 by Ermalinda Barrios, PA at whicht ime she had recent admission for acute metabolic encephalopathy and myoclonic jerks that resolved.  Gabapentin was stopped.  She had not completed cardiac testing which had been ordered and low BP prohibited adding Entresto or other medication titration. Her Lasix was reduced.    Cardiac PET CT 12/05/21 revealed no ischemia. Return office visit 12/19/21 she reported dizziness when she stands. Came in with an aide from SNF.  Had fallen when going to the toilet.  Patient was orthostatic in the office with BP decreased from 118/70 to 88/50 when standing. Losartan and Lasix were d/ced.  She was advised to wear compression stockings and increase hydration. Return office visit 01/09/22 with Amie Portland, PA.  Patient had not gotten compression stockings.  Did not appear volume overloaded on exam.  Continuing to have dizziness and  unsteadiness on her feet. Symptoms felt to be 2/2 psychiatric meds. Advised to return in 2 months.   Admission 10/9-10/17/23 who presented to [facility] from assisted living with altered mental status and progressive weakness for the previous 2 weeks.  Upon arrival in ED he was found to be short of breath requiring supplemental oxygen, sodium was 111, creatinine 0.62.  CT of head was negative, CXR demonstrated mild pulmonary edema.  She was transferred to [facility] for management of hyponatremia. Advised to limit fluids to 1.2 L daily. Psychiatric medications adjusted.   Today, she is here for post-hospital follow-up.   Past Medical History:  Diagnosis Date   Anemia 1990   hx of   Anxiety    Arthritis 2004   Asthma 1995   uses inhaler   Chronic kidney disease 2010   LEFT kidney removed   Colon polyp    Complication of anesthesia 1966   first surgery she had- had a hard time waking up from surgery   Delusional disorder ([place])    Six delusional disorder   Diverticulosis 2003   GERD (gastroesophageal reflux disease)    on meds   Schizophrenia [facility])     Past Surgical History:  Procedure Laterality Date   APPENDECTOMY  1966   CATARACT EXTRACTION     Bilateral   CHOLECYSTECTOMY  2004   COLON SURGERY  2008   COLONOSCOPY  2013   JP-MAC-moviprep(good)-TA   COLOSTOMY     COLOSTOMY TAKEDOWN     DIAGNOSTIC LAPAROSCOPY  exploratory for endometriosis   HEEL SPUR SURGERY     Right, Metal Plate in Heel   [place]  2010   NEPHRECTOMY Left 4193   As complication of partial colectomy for diverticulitis   POLYPECTOMY  2013   TA   TONSILLECTOMY     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   Procedure: LEFT TOTAL KNEE ARTHROPLASTY;  Surgeon: Susa Day, MD;  Location: [facility];  Service: Orthopedics;  Laterality: Left;  Adductor Block    Current Medications: No outpatient medications have been marked as taking for the 01/31/22 encounter (Appointment) with Ann Maki, Lanice Schwab, NP.      Allergies:   Haloperidol, Aripiprazole, and Chlorpheniramine-pse-ibuprofen   Social History   Socioeconomic History   Marital status: Legally Separated    Spouse name: Not on file   Number of children: 10   Years of education: Not on file   Highest education level: Not on file  Occupational History   Occupation: retired  Tobacco Use   Smoking status: Never   Smokeless tobacco: Never  Vaping Use   Vaping Use: Never used  Substance and Sexual Activity   Alcohol use: Not Currently    Comment: occassionally   Drug use: No   Sexual activity: Not Currently  Other Topics Concern   Not on file  Social History Narrative   Not on file   Social Determinants of Health   Financial Resource Strain: Not on file  Food Insecurity: Not on file  Transportation Needs: Not on file  Physical Activity: Not on file  Stress: Not on file  Social Connections: Not on file     Family History: The patient's ***family history includes COPD in her father; Colon polyps in an other family member; Diabetes in her paternal aunt and paternal grandmother; Heart attack in her father; Heart failure in her mother; Hyperlipidemia in her mother; Hypertension in her father and mother; Stroke in her mother. There is no history of Stomach cancer, Rectal cancer, Esophageal cancer, or Colon cancer.  ROS:   Please see the history of present illness.    *** All other systems reviewed and are negative.  Labs/Other Studies Reviewed:    The following studies were reviewed today:  NM PET CT Cardiac 12/05/21    Rest left ventricular function is abnormal. Rest global function is severely reduced. There were no regional wall motion abnormalities. Rest EF: 28 %. Stress left ventricular function is abnormal. Stress global function is severely reduced. There were no regional wall abnormalities. Stress EF: 35 %. End diastolic cavity size is severely enlarged. End systolic cavity size is normal.   Myocardial blood flow  was computed to be 0.23m/g/min at rest and 1.360mg/min at stress. Global myocardial blood flow reserve was 1.51 and was abnormal. This finding can be seen in both microvascular disease and in severe LV dysfunction; given LVEF at rest, the latter is suspected.   Coronary calcium was present on the attenuation correction CT images. Severe coronary calcifications were present. Coronary calcifications were present in the left anterior descending artery and right coronary artery distribution(s).   Findings are consistent with no prior ischemia. The study is high risk due to LVEF and, to a lesser extent, due to MBFR.   Recent Labs: 01/15/2022: ALT 24; TSH 1.121 01/23/2022: B Natriuretic Peptide 229.6; BUN 31; Creatinine, Ser 1.23; Hemoglobin 11.7; Magnesium 2.0; Platelets 202; Potassium 4.8; Sodium 132  Recent Lipid Panel    Component Value Date/Time   CHOL 139 10/29/2016 1553  TRIG 141 10/29/2016 1553   HDL 43 10/29/2016 1553   CHOLHDL 3.2 10/29/2016 1553   LDLCALC 68 10/29/2016 1553     Risk Assessment/Calculations:   {Does this patient have ATRIAL FIBRILLATION?:[phone]}       Physical Exam:    VS:  There were no vitals taken for this visit.    Wt Readings from Last 3 Encounters:  01/23/22 178 lb 6 oz (80.9 kg)  01/09/22 184 lb 6 oz (83.6 kg)  12/19/21 179 lb (81.2 kg)     GEN: *** Well nourished, well developed in no acute distress HEENT: Normal NECK: No JVD; No carotid bruits CARDIAC: ***RRR, no murmurs, rubs, gallops RESPIRATORY:  Clear to auscultation without rales, wheezing or rhonchi  ABDOMEN: Soft, non-tender, non-distended MUSCULOSKELETAL:  No edema; No deformity. *** pedal pulses, ***bilaterally SKIN: Warm and dry NEUROLOGIC:  Alert and oriented x 3 PSYCHIATRIC:  Normal affect   EKG:  EKG is *** ordered today.  The ekg ordered today demonstrates ***  No BP recorded.  {Refresh Note OR Click here to enter BP  :1}***    Diagnoses:    No diagnosis  found. Assessment and Plan:     Chronic HFrEF: Hyponatremia:   {Are you ordering a CV Procedure (e.g. stress test, cath, DCCV, TEE, etc)?   Press F2        :[id number]}   Disposition:  Medication Adjustments/Labs and Tests Ordered: Current medicines are reviewed at length with the patient today.  Concerns regarding medicines are outlined above.  No orders of the defined types were placed in this encounter.  No orders of the defined types were placed in this encounter.   There are no Patient Instructions on file for this visit.   Signed, Emmaline Life, NP  01/28/2022 3:07 PM    [place]

## 2022-01-29 ENCOUNTER — Inpatient Hospital Stay (HOSPITAL_COMMUNITY)
Admission: EM | Admit: 2022-01-29 | Discharge: 2022-02-02 | DRG: 086 | Disposition: A | Discharge status: Nursing Home (Includes ICF) | Payer: Medicare Other | Source: Skilled Nursing Facility | Attending: Internal Medicine | Admitting: Internal Medicine

## 2022-01-29 ENCOUNTER — Observation Stay (HOSPITAL_COMMUNITY): Payer: Medicare Other

## 2022-01-29 ENCOUNTER — Encounter (HOSPITAL_COMMUNITY): Payer: Self-pay

## 2022-01-29 ENCOUNTER — Emergency Department (HOSPITAL_COMMUNITY): Payer: Medicare Other

## 2022-01-29 ENCOUNTER — Encounter (HOSPITAL_COMMUNITY): Payer: Self-pay | Admitting: Emergency Medicine

## 2022-01-29 ENCOUNTER — Other Ambulatory Visit: Payer: Self-pay

## 2022-01-29 DIAGNOSIS — D72829 Elevated white blood cell count, unspecified: Secondary | ICD-10-CM

## 2022-01-29 DIAGNOSIS — Z9841 Cataract extraction status, right eye: Secondary | ICD-10-CM

## 2022-01-29 DIAGNOSIS — F319 Bipolar disorder, unspecified: Secondary | ICD-10-CM | POA: Diagnosis present

## 2022-01-29 DIAGNOSIS — I5022 Chronic systolic (congestive) heart failure: Secondary | ICD-10-CM | POA: Diagnosis present

## 2022-01-29 DIAGNOSIS — Z96652 Presence of left artificial knee joint: Secondary | ICD-10-CM | POA: Diagnosis present

## 2022-01-29 DIAGNOSIS — M25531 Pain in right wrist: Secondary | ICD-10-CM | POA: Diagnosis present

## 2022-01-29 DIAGNOSIS — I9589 Other hypotension: Secondary | ICD-10-CM | POA: Diagnosis present

## 2022-01-29 DIAGNOSIS — R402142 Coma scale, eyes open, spontaneous, at arrival to emergency department: Secondary | ICD-10-CM | POA: Diagnosis present

## 2022-01-29 DIAGNOSIS — K219 Gastro-esophageal reflux disease without esophagitis: Secondary | ICD-10-CM | POA: Diagnosis present

## 2022-01-29 DIAGNOSIS — Z905 Acquired absence of kidney: Secondary | ICD-10-CM

## 2022-01-29 DIAGNOSIS — S065X0A Traumatic subdural hemorrhage without loss of consciousness, initial encounter: Principal | ICD-10-CM | POA: Diagnosis present

## 2022-01-29 DIAGNOSIS — Z8249 Family history of ischemic heart disease and other diseases of the circulatory system: Secondary | ICD-10-CM

## 2022-01-29 DIAGNOSIS — M1990 Unspecified osteoarthritis, unspecified site: Secondary | ICD-10-CM | POA: Diagnosis present

## 2022-01-29 DIAGNOSIS — R402362 Coma scale, best motor response, obeys commands, at arrival to emergency department: Secondary | ICD-10-CM | POA: Diagnosis present

## 2022-01-29 DIAGNOSIS — S065XAA Traumatic subdural hemorrhage with loss of consciousness status unknown, initial encounter: Secondary | ICD-10-CM | POA: Diagnosis not present

## 2022-01-29 DIAGNOSIS — N1831 Chronic kidney disease, stage 3a: Secondary | ICD-10-CM | POA: Diagnosis present

## 2022-01-29 DIAGNOSIS — Y92009 Unspecified place in unspecified non-institutional (private) residence as the place of occurrence of the external cause: Secondary | ICD-10-CM

## 2022-01-29 DIAGNOSIS — W19XXXA Unspecified fall, initial encounter: Secondary | ICD-10-CM

## 2022-01-29 DIAGNOSIS — Z79899 Other long term (current) drug therapy: Secondary | ICD-10-CM

## 2022-01-29 DIAGNOSIS — Y92091 Bathroom in other non-institutional residence as the place of occurrence of the external cause: Secondary | ICD-10-CM

## 2022-01-29 DIAGNOSIS — Z888 Allergy status to other drugs, medicaments and biological substances status: Secondary | ICD-10-CM

## 2022-01-29 DIAGNOSIS — W1839XA Other fall on same level, initial encounter: Secondary | ICD-10-CM | POA: Diagnosis present

## 2022-01-29 DIAGNOSIS — F200 Paranoid schizophrenia: Secondary | ICD-10-CM | POA: Diagnosis present

## 2022-01-29 DIAGNOSIS — Z83719 Family history of colon polyps, unspecified: Secondary | ICD-10-CM

## 2022-01-29 DIAGNOSIS — Z9842 Cataract extraction status, left eye: Secondary | ICD-10-CM

## 2022-01-29 DIAGNOSIS — Z825 Family history of asthma and other chronic lower respiratory diseases: Secondary | ICD-10-CM

## 2022-01-29 DIAGNOSIS — Z9049 Acquired absence of other specified parts of digestive tract: Secondary | ICD-10-CM

## 2022-01-29 DIAGNOSIS — K5900 Constipation, unspecified: Secondary | ICD-10-CM | POA: Diagnosis present

## 2022-01-29 DIAGNOSIS — R402252 Coma scale, best verbal response, oriented, at arrival to emergency department: Secondary | ICD-10-CM | POA: Diagnosis present

## 2022-01-29 DIAGNOSIS — J45909 Unspecified asthma, uncomplicated: Secondary | ICD-10-CM | POA: Diagnosis present

## 2022-01-29 DIAGNOSIS — M7918 Myalgia, other site: Secondary | ICD-10-CM | POA: Diagnosis present

## 2022-01-29 DIAGNOSIS — Z7951 Long term (current) use of inhaled steroids: Secondary | ICD-10-CM

## 2022-01-29 DIAGNOSIS — N390 Urinary tract infection, site not specified: Secondary | ICD-10-CM | POA: Diagnosis present

## 2022-01-29 DIAGNOSIS — F439 Reaction to severe stress, unspecified: Secondary | ICD-10-CM | POA: Diagnosis present

## 2022-01-29 DIAGNOSIS — I6200 Nontraumatic subdural hemorrhage, unspecified: Principal | ICD-10-CM

## 2022-01-29 DIAGNOSIS — S01111A Laceration without foreign body of right eyelid and periocular area, initial encounter: Secondary | ICD-10-CM | POA: Diagnosis present

## 2022-01-29 DIAGNOSIS — Z86010 Personal history of colonic polyps: Secondary | ICD-10-CM

## 2022-01-29 LAB — BASIC METABOLIC PANEL
Anion gap: 9 (ref 5–15)
BUN: 21 mg/dL (ref 8–23)
CO2: 27 mmol/L (ref 22–32)
Calcium: 8.6 mg/dL — ABNORMAL LOW (ref 8.9–10.3)
Chloride: 98 mmol/L (ref 98–111)
Creatinine, Ser: 1.08 mg/dL — ABNORMAL HIGH (ref 0.44–1.00)
GFR, Estimated: 52 mL/min — ABNORMAL LOW (ref >60)
Glucose, Bld: 96 mg/dL (ref 70–99)
Potassium: 4.2 mmol/L (ref 3.5–5.1)
Sodium: 134 mmol/L — ABNORMAL LOW (ref 135–145)

## 2022-01-29 LAB — CBC WITH DIFFERENTIAL/PLATELET
Abs Immature Granulocytes: 0.05 10*3/uL (ref 0.00–0.07)
Basophils Absolute: 0.1 10*3/uL (ref 0.0–0.1)
Basophils Relative: 1 %
Eosinophils Absolute: 0.2 10*3/uL (ref 0.0–0.5)
Eosinophils Relative: 1 %
HCT: 38.7 % (ref 36.0–46.0)
Hemoglobin: 12.3 g/dL (ref 12.0–15.0)
Immature Granulocytes: 0 %
Lymphocytes Relative: 8 %
Lymphs Abs: 1 10*3/uL (ref 0.7–4.0)
MCH: 30.1 pg (ref 26.0–34.0)
MCHC: 31.8 g/dL (ref 30.0–36.0)
MCV: 94.6 fL (ref 80.0–100.0)
Monocytes Absolute: 1.3 10*3/uL — ABNORMAL HIGH (ref 0.1–1.0)
Monocytes Relative: 10 %
Neutro Abs: 10.7 10*3/uL — ABNORMAL HIGH (ref 1.7–7.7)
Neutrophils Relative %: 80 %
Platelets: 301 10*3/uL (ref 150–400)
RBC: 4.09 MIL/uL (ref 3.87–5.11)
RDW: 13 % (ref 11.5–15.5)
WBC: 13.3 10*3/uL — ABNORMAL HIGH (ref 4.0–10.5)
nRBC: 0 % (ref 0.0–0.2)

## 2022-01-29 MED ORDER — AMANTADINE HCL 100 MG PO CAPS
100.0000 mg | ORAL_CAPSULE | Freq: Two times a day (BID) | ORAL | Status: DC
  Administered 2022-01-30 – 2022-02-02 (×7): 100 mg via ORAL
  Filled 2022-01-29 (×7): qty 1

## 2022-01-29 MED ORDER — ALBUTEROL SULFATE (2.5 MG/3ML) 0.083% IN NEBU: 2.5000 mg | INHALATION_SOLUTION | RESPIRATORY_TRACT | Status: DC | PRN

## 2022-01-29 MED ORDER — RISPERIDONE 1 MG PO TABS
1.0000 mg | ORAL_TABLET | Freq: Every day | ORAL | Status: DC
  Administered 2022-01-30 – 2022-02-01 (×3): 1 mg via ORAL
  Filled 2022-01-29 (×3): qty 1

## 2022-01-29 MED ORDER — VALBENAZINE TOSYLATE 40 MG PO CAPS: 40.0000 mg | ORAL_CAPSULE | Freq: Every day | ORAL | Status: DC

## 2022-01-29 MED ORDER — MIDODRINE HCL 5 MG PO TABS
5.0000 mg | ORAL_TABLET | Freq: Two times a day (BID) | ORAL | Status: DC
  Administered 2022-01-30 – 2022-02-02 (×7): 5 mg via ORAL
  Filled 2022-01-29 (×7): qty 1

## 2022-01-29 MED ORDER — MIRTAZAPINE 15 MG PO TABS
7.5000 mg | ORAL_TABLET | Freq: Every day | ORAL | Status: DC
  Administered 2022-01-30 – 2022-02-01 (×3): 7.5 mg via ORAL
  Filled 2022-01-29 (×3): qty 1

## 2022-01-29 MED ORDER — GABAPENTIN 100 MG PO CAPS
100.0000 mg | ORAL_CAPSULE | Freq: Every day | ORAL | Status: DC
  Administered 2022-01-30 – 2022-02-01 (×3): 100 mg via ORAL
  Filled 2022-01-29 (×3): qty 1

## 2022-01-29 MED ORDER — OXYCODONE HCL 5 MG PO TABS
5.0000 mg | ORAL_TABLET | ORAL | Status: DC | PRN
  Administered 2022-01-31 – 2022-02-02 (×4): 5 mg via ORAL
  Filled 2022-01-29 (×4): qty 1

## 2022-01-29 MED ORDER — ONDANSETRON HCL 4 MG/2ML IJ SOLN: 4.0000 mg | Freq: Four times a day (QID) | INTRAMUSCULAR | Status: DC | PRN

## 2022-01-29 MED ORDER — LIDOCAINE-EPINEPHRINE-TETRACAINE (LET) TOPICAL GEL
3.0000 mL | Freq: Once | TOPICAL | Status: AC
  Administered 2022-01-29: 3 mL via TOPICAL
  Filled 2022-01-29: qty 3

## 2022-01-29 MED ORDER — PANTOPRAZOLE SODIUM 40 MG PO TBEC
40.0000 mg | DELAYED_RELEASE_TABLET | Freq: Every day | ORAL | Status: DC
  Administered 2022-01-30 – 2022-02-02 (×4): 40 mg via ORAL
  Filled 2022-01-29 (×4): qty 1

## 2022-01-29 MED ORDER — MELATONIN 3 MG PO TABS
3.0000 mg | ORAL_TABLET | Freq: Every day | ORAL | Status: DC
  Administered 2022-01-30 – 2022-02-01 (×3): 3 mg via ORAL
  Filled 2022-01-29 (×3): qty 1

## 2022-01-29 MED ORDER — MORPHINE SULFATE (PF) 2 MG/ML IV SOLN: 2.0000 mg | INTRAVENOUS | Status: DC | PRN

## 2022-01-29 MED ORDER — FLUTICASONE FUROATE-VILANTEROL 200-25 MCG/ACT IN AEPB
1.0000 | INHALATION_SPRAY | Freq: Every day | RESPIRATORY_TRACT | Status: DC
  Administered 2022-01-30 – 2022-02-02 (×4): 1 via RESPIRATORY_TRACT
  Filled 2022-01-29: qty 28

## 2022-01-29 MED ORDER — ACETAMINOPHEN 650 MG RE SUPP: 650.0000 mg | Freq: Four times a day (QID) | RECTAL | Status: DC | PRN

## 2022-01-29 MED ORDER — MENTHOL 3 MG MT LOZG: 1.0000 | LOZENGE | OROMUCOSAL | Status: DC | PRN

## 2022-01-29 MED ORDER — ACETAMINOPHEN 325 MG PO TABS: 650.0000 mg | ORAL_TABLET | Freq: Four times a day (QID) | ORAL | Status: DC | PRN

## 2022-01-29 MED ORDER — ONDANSETRON HCL 4 MG PO TABS: 4.0000 mg | ORAL_TABLET | Freq: Four times a day (QID) | ORAL | Status: DC | PRN

## 2022-01-29 NOTE — Assessment & Plan Note (Signed)
-   Continue Remeron, Risperdal, Ingrezza, Symmetrel - Continue to monitor

## 2022-01-29 NOTE — ED Provider Notes (Signed)
[facility] EMERGENCY DEPARTMENT Provider Note   CSN: [id number] Arrival date & time: 01/29/22  1445     History  Chief Complaint  Patient presents with   [name] is a 81 y.o. female.  Patient has a history of schizophrenia and renal disease.  She lives in an assisted living.  Patient fell from a standing position and hit her head.  Unknown loss of consciousness.  Patient is alert now  The history is provided by the patient and medical records. No language interpreter was used.  Fall This is a new problem. The current episode started less than 1 hour ago. The problem occurs rarely. The problem has been resolved. Pertinent negatives include no chest pain, no abdominal pain and no headaches. Nothing aggravates the symptoms. Nothing relieves the symptoms. She has tried nothing for the symptoms.       Home Medications Prior to Admission medications   Medication Sig Start Date End Date Taking? Authorizing Provider  acetaminophen (TYLENOL) 325 MG tablet Take 650 mg by mouth every 6 (six) hours as needed.    [provider]  amantadine (SYMMETREL) 100 MG capsule Take 100 mg by mouth 2 (two) times daily. 05/03/21   [provider]  antiseptic oral rinse (BIOTENE) LIQD 15 mLs by Mouth Rinse route as needed for dry mouth.    [provider]  BREO ELLIPTA 200-25 MCG/INH AEPB Inhale 1 puff into the lungs daily. 06/22/20   [provider]  cetirizine (ZYRTEC) 10 MG tablet Take 10 mg by mouth daily.    [provider]  cyanocobalamin 1000 MCG tablet Take 1,000 mcg by mouth daily.    [provider]  fluticasone (FLONASE) 50 MCG/ACT nasal spray Place 2 sprays into both nostrils daily. 05/02/21   [provider]  gabapentin (NEURONTIN) 100 MG capsule Take 100 mg by mouth at bedtime.    [provider]  melatonin 3 MG TABS tablet Take 3 mg by mouth at bedtime.    [provider]  midodrine  (PROAMATINE) 5 MG tablet Take 1 tablet (5 mg total) by mouth 2 (two) times daily with a meal. 01/23/22   Thurnell Lose, MD  mirtazapine (REMERON) 7.5 MG tablet Take 1 tablet (7.5 mg total) by mouth at bedtime. 01/23/22   Thurnell Lose, MD  Multiple Vitamins-Minerals (PRESERVISION AREDS) TABS Take 1 tablet by mouth in the morning and at bedtime.    [provider]  omeprazole (PRILOSEC) 20 MG capsule Take 1 capsule (20 mg total) by mouth daily. 06/19/19   Palumbo, April, MD  risperiDONE (RISPERDAL) 1 MG tablet Take 1 tablet (1 mg total) by mouth at bedtime. 10/27/21   Johnson, Clanford L, MD  valbenazine (INGREZZA) 40 MG capsule Take 1 capsule (40 mg total) by mouth daily. 01/23/22 02/22/22  France Ravens, MD      Allergies    Haloperidol, Aripiprazole, and Chlorpheniramine-pse-ibuprofen    Review of Systems   Review of Systems  Constitutional:  Negative for appetite change and fatigue.  HENT:  Negative for congestion, ear discharge and sinus pressure.        Headache  Eyes:  Negative for discharge.  Respiratory:  Negative for cough.   Cardiovascular:  Negative for chest pain.  Gastrointestinal:  Negative for abdominal pain and diarrhea.  Genitourinary:  Negative for frequency and hematuria.  Musculoskeletal:  Negative for back pain.       Wrist pain on right  Skin:  Negative for rash.  Neurological:  Negative for seizures and headaches.  Psychiatric/Behavioral:  Negative for hallucinations.     Physical Exam Updated Vital Signs BP 118/63   Pulse 93   Temp 98.2 F (36.8 C)   Resp 20   SpO2 93%  Physical Exam Vitals reviewed.  Constitutional:      Appearance: She is well-developed.  HENT:     Head: Normocephalic.     Comments: 3 cm laceration of forehead    Mouth/Throat:     Mouth: Mucous membranes are moist.  Eyes:     General: No scleral icterus.    Conjunctiva/sclera: Conjunctivae normal.  Neck:     Thyroid: No thyromegaly.  Cardiovascular:     Rate and  Rhythm: Normal rate and regular rhythm.     Heart sounds: No murmur heard.    No friction rub. No gallop.  Pulmonary:     Breath sounds: No stridor. No wheezing or rales.  Chest:     Chest wall: No tenderness.  Abdominal:     General: There is no distension.     Tenderness: There is no abdominal tenderness. There is no rebound.  Musculoskeletal:        General: Normal range of motion.     Cervical back: Neck supple.     Comments: Tenderness left wrist  Lymphadenopathy:     Cervical: No cervical adenopathy.  Skin:    Findings: No erythema or rash.  Neurological:     Mental Status: She is oriented to person, place, and time.     Motor: No abnormal muscle tone.     Coordination: Coordination normal.  Psychiatric:        Behavior: Behavior normal.     ED Results / Procedures / Treatments   Labs (all labs ordered are listed, but only abnormal results are displayed) Labs Reviewed  CBC WITH DIFFERENTIAL/PLATELET - Abnormal; Notable for the following components:      Result Value   WBC 13.3 (*)    Neutro Abs 10.7 (*)    Monocytes Absolute 1.3 (*)    All other components within normal limits  BASIC METABOLIC PANEL - Abnormal; Notable for the following components:   Sodium 134 (*)    Creatinine, Ser 1.08 (*)    Calcium 8.6 (*)    GFR, Estimated 52 (*)    All other components within normal limits    EKG None  Radiology CT Head Wo Contrast  Result Date: 01/29/2022 CLINICAL DATA:  Fall from standing with laceration above right eyebrow EXAM: CT HEAD WITHOUT CONTRAST CT CERVICAL SPINE WITHOUT CONTRAST TECHNIQUE: Multidetector CT imaging of the head and cervical spine was performed following the standard protocol without intravenous contrast. Multiplanar CT image reconstructions of the cervical spine were also generated. RADIATION DOSE REDUCTION: This exam was performed according to the departmental dose-optimization program which includes automated exposure control, adjustment of  the mA and/or kV according to patient size and/or use of iterative reconstruction technique. COMPARISON:  CT head and cervical spine 10/24/2021 FINDINGS: CT HEAD FINDINGS Brain: There is acute subdural blood along the left tentorial leaflet measuring up to 6 mm in thickness without mass effect on the underlying parenchyma or parenchymal edema. There is trace subdural blood measuring 1-2 mm in thickness overlying the left cerebral hemisphere laterally (4-34). There is no other acute intracranial hemorrhage or extra-axial fluid collection. There is no acute territorial infarct. Background parenchymal volume is normal for age. The ventricles are normal in size. Patchy  and confluent hypodensity in the supratentorial white matter likely reflects sequela of moderate chronic small vessel ischemic change, stable. There is no mass lesion.  There is no midline shift. Vascular: No hyperdense vessel or unexpected calcification. Skull: Normal. Negative for fracture or focal lesion. Sinuses/Orbits: The paranasal sinuses are clear. Bilateral lens implants are in place. The globes and orbits are otherwise unremarkable. Other: Surgical staples are noted along the right lateral orbital rim. CT CERVICAL SPINE FINDINGS Alignment: Normal. There is no jumped or perched facet or other evidence of traumatic malalignment. Skull base and vertebrae: Skull base alignment is maintained. Vertebral body heights are preserved. There is no evidence of acute fracture. There is no suspicious osseous lesion. Soft tissues and spinal canal: No prevertebral fluid or swelling. No visible canal hematoma. Disc levels: There is disc space narrowing degenerative endplate change most advanced at C5-C6 and C6-C7, stable. There is no evidence of high-grade spinal canal stenosis. Upper chest: There is interlobular septal thickening in the lung apices with partially imaged opacity in the right lung apex. Other: Right thyroid lobe is enlarged and heterogeneous,  unchanged since 2018. No specific imaging follow-up is required. IMPRESSION: 1. Acute subdural hematoma primarily layering along the left tentorial leaflet measuring up to 6 mm without mass effect or parenchymal edema. 2. No acute fracture or traumatic malalignment of the cervical spine. 3. Interlobular septal thickening in the lung apices may reflect pulmonary edema. Incompletely imaged opacity in the right lung apex. Consider dedicated imaging of the chest. Critical Value/emergent results were called by telephone at the time of interpretation on 01/29/2022 at 5:06 pm to provider [name] , who verbally acknowledged these results. Electronically Signed   By: Valetta Mole M.D.   On: 01/29/2022 17:08   CT Cervical Spine Wo Contrast  Result Date: 01/29/2022 CLINICAL DATA:  Fall from standing with laceration above right eyebrow EXAM: CT HEAD WITHOUT CONTRAST CT CERVICAL SPINE WITHOUT CONTRAST TECHNIQUE: Multidetector CT imaging of the head and cervical spine was performed following the standard protocol without intravenous contrast. Multiplanar CT image reconstructions of the cervical spine were also generated. RADIATION DOSE REDUCTION: This exam was performed according to the departmental dose-optimization program which includes automated exposure control, adjustment of the mA and/or kV according to patient size and/or use of iterative reconstruction technique. COMPARISON:  CT head and cervical spine 10/24/2021 FINDINGS: CT HEAD FINDINGS Brain: There is acute subdural blood along the left tentorial leaflet measuring up to 6 mm in thickness without mass effect on the underlying parenchyma or parenchymal edema. There is trace subdural blood measuring 1-2 mm in thickness overlying the left cerebral hemisphere laterally (4-34). There is no other acute intracranial hemorrhage or extra-axial fluid collection. There is no acute territorial infarct. Background parenchymal volume is normal for age. The ventricles are  normal in size. Patchy and confluent hypodensity in the supratentorial white matter likely reflects sequela of moderate chronic small vessel ischemic change, stable. There is no mass lesion.  There is no midline shift. Vascular: No hyperdense vessel or unexpected calcification. Skull: Normal. Negative for fracture or focal lesion. Sinuses/Orbits: The paranasal sinuses are clear. Bilateral lens implants are in place. The globes and orbits are otherwise unremarkable. Other: Surgical staples are noted along the right lateral orbital rim. CT CERVICAL SPINE FINDINGS Alignment: Normal. There is no jumped or perched facet or other evidence of traumatic malalignment. Skull base and vertebrae: Skull base alignment is maintained. Vertebral body heights are preserved. There is no evidence of acute fracture.  There is no suspicious osseous lesion. Soft tissues and spinal canal: No prevertebral fluid or swelling. No visible canal hematoma. Disc levels: There is disc space narrowing degenerative endplate change most advanced at C5-C6 and C6-C7, stable. There is no evidence of high-grade spinal canal stenosis. Upper chest: There is interlobular septal thickening in the lung apices with partially imaged opacity in the right lung apex. Other: Right thyroid lobe is enlarged and heterogeneous, unchanged since 2018. No specific imaging follow-up is required. IMPRESSION: 1. Acute subdural hematoma primarily layering along the left tentorial leaflet measuring up to 6 mm without mass effect or parenchymal edema. 2. No acute fracture or traumatic malalignment of the cervical spine. 3. Interlobular septal thickening in the lung apices may reflect pulmonary edema. Incompletely imaged opacity in the right lung apex. Consider dedicated imaging of the chest. Critical Value/emergent results were called by telephone at the time of interpretation on 01/29/2022 at 5:06 pm to provider [name] , who verbally acknowledged these results.  Electronically Signed   By: Valetta Mole M.D.   On: 01/29/2022 17:08   DG Wrist Complete Right  Result Date: 01/29/2022 CLINICAL DATA:  Post fall today. EXAM: RIGHT WRIST - COMPLETE 3+ VIEW COMPARISON:  None available FINDINGS: Degenerative changes throughout the wrist and first carpometacarpal joint. No signs of soft tissue swelling, fracture or of dislocation. IMPRESSION: Degenerative changes without acute fracture or dislocation. Electronically Signed   By: Zetta Bills M.D.   On: 01/29/2022 15:46    Procedures .Marland KitchenLaceration Repair  Date/Time: 01/29/2022 7:11 PM  Performed by: Milton Ferguson, MD Authorized by: Milton Ferguson, MD   Comments:     Patient had a 3 cm laceration to her right forehead.  There area was anesthetized with LET.  3 staples were used to close laceration.  Patient tolerated the procedure well     Medications Ordered in ED Medications  lidocaine-EPINEPHrine-tetracaine (LET) topical gel (3 mLs Topical Given 01/29/22 1629)    ED Course/ Medical Decision Making/ A&P  CRITICAL CARE Performed by: Milton Ferguson Total critical care time: 40 minutes Critical care time was exclusive of separately billable procedures and treating other patients. Critical care was necessary to treat or prevent imminent or life-threatening deterioration. Critical care was time spent personally by me on the following activities: development of treatment plan with patient and/or surrogate as well as nursing, discussions with consultants, evaluation of patient's response to treatment, examination of patient, obtaining history from patient or surrogate, ordering and performing treatments and interventions, ordering and review of laboratory studies, ordering and review of radiographic studies, pulse oximetry and re-evaluation of patient's condition.   Patient has a subdural hematoma of 6 mm in size.  I spoke with neurosurgery and they recommended having the hospitalist admit the patient for  observation and getting a new CT of the head tomorrow.                         Medical Decision Making Amount and/or Complexity of Data Reviewed Labs: ordered. Radiology: ordered.  Risk Decision regarding hospitalization.  This patient presents to the ED for concern of fall., this involves an extensive number of treatment options, and is a complaint that carries with it a high risk of complications and morbidity.  The differential diagnosis includes head injury,   Co morbidities that complicate the patient evaluation  Schizophrenia renal disease   Additional history obtained:  Additional history obtained from patient External records from outside source obtained and  reviewed including hospital records   Lab Tests:  I Ordered, and personally interpreted labs.  The pertinent results include: White blood count 13 chemistries show GFR 52   Imaging Studies ordered:  I ordered imaging studies including CT head and cervical spine with plain films of right wrist I independently visualized and interpreted imaging which showed negative I agree with the radiologist interpretation   Cardiac Monitoring: / EKG:  The patient was maintained on a cardiac monitor.  I personally viewed and interpreted the cardiac monitored which showed an underlying rhythm of: Normal sinus rhythm   Consultations Obtained:  I requested consultation with the hospitalist and neurosurgery,  and discussed lab and imaging findings as well as pertinent plan - they recommend: Admit for observation   Problem List / ED Course / Critical interventions / Medication management  Fall and head injury, schizophrenia I ordered medication including no medicines ordered Reevaluation of the patient after these medicines showed that the patient stayed the same I have reviewed the patients home medicines and have made adjustments as needed   Social Determinants of Health:  From assisted living   Test / Admission -  Considered:  No additional test needed  Fall with small subdural hematoma.  Patient also has a small laceration to her forehead.  Staples can be removed in 6 to 8 days        Final Clinical Impression(s) / ED Diagnoses Final diagnoses:  Subdural bleeding [facility])    Rx / DC Orders ED Discharge Orders     None         Milton Ferguson, MD 02/04/22 1038

## 2022-01-29 NOTE — ED Notes (Signed)
Gave dinner tray  °

## 2022-01-29 NOTE — ED Triage Notes (Signed)
Pt BIB [facility] from [facility] with reports of fall from standing position. PT has laceration to the right brow.

## 2022-01-29 NOTE — Assessment & Plan Note (Signed)
-   6 mm hematoma on CT - Neurosurgery consulted and advises observation overnight with repeat CT head in the a.m. - Neurochecks - [facility] to [facility] with bed to become available - Continue monitor

## 2022-01-29 NOTE — ED Notes (Signed)
Pt

## 2022-01-29 NOTE — Assessment & Plan Note (Signed)
-  light headed prior to fall -Possibly near syncopal event -Did not lose consciousness -Did hit head -Not on blood thinners -CT scan shows sundural hematoma - see associated assessment and plan -Echo in the AM for near syncope -PT eval and treat

## 2022-01-29 NOTE — ED Notes (Signed)
Patient has laceration to right eyebrow; bleeding. Wound cleaned and right side of the head cleaned at this time. No other wounds found on the head or neck.

## 2022-01-29 NOTE — Assessment & Plan Note (Signed)
-   13.3 - Likely reactive in setting of acute fall - UA pending - No respiratory symptoms - Continue to monitor

## 2022-01-29 NOTE — H&P (Signed)
History and Physical    Patient: [name] UEA:[id number] DOB: Feb 25, 1941 DOA: 01/29/2022 DOS: the patient was seen and examined on 01/29/2022 PCP: Lubertha Sayres, FNP  Patient coming from: ALF/ILF  Chief Complaint:  Chief Complaint  Patient presents with   Fall   HPI: [name] is a 81 y.o. female with medical history significant of anxiety, CKD, schizophrenia and bipolar disorder, GERD, and more presents the ED with a chief complaint of fall.  Patient reports that she was in her normal state of health.  She ambulates with a walker, and she had gotten up to go to the bathroom.  When she got to the bathroom she turned around to turn on the light, felt lightheaded and then was on the ground.  Patient reports she did not lose consciousness.  She did hit her head over her right eyebrow.  She is not on blood thinners.  Patient denies chest pain and loss of consciousness.  She did admit to palpitations after the fall, while laying on the floor.  Patient reports no change in vision, no change in hearing.  She has not had any asymmetric weakness.  She denies difficulty swallowing or speaking.  Patient did hurt her right wrist that she tried to catch herself.  She reports she has sharp pain in her right wrist.  X-ray did not show acute fracture.  On review of systems patient does complain of dryness of her mouth especially at night.  This is likely from medications.  It has been ongoing.  Patient has no other complaints at this time.  Patient does not smoke, does not drink, does not use illicit drugs.  She is vaccinated for COVID.  Patient is full code. Review of Systems: As mentioned in the history of present illness. All other systems reviewed and are negative. Past Medical History:  Diagnosis Date   Anemia 1990   hx of   Anxiety    Arthritis 2004   Asthma 1995   uses inhaler   Chronic kidney disease 2010   LEFT kidney removed   Colon polyp    Complication of anesthesia 1966    first surgery she had- had a hard time waking up from surgery   Delusional disorder ([place])    Six delusional disorder   Diverticulosis 2003   GERD (gastroesophageal reflux disease)    on meds   Schizophrenia [facility])    Past Surgical History:  Procedure Laterality Date   APPENDECTOMY  1966   CATARACT EXTRACTION     Bilateral   CHOLECYSTECTOMY  2004   COLON SURGERY  2008   COLONOSCOPY  2013   JP-MAC-moviprep(good)-TA   COLOSTOMY     COLOSTOMY TAKEDOWN     DIAGNOSTIC LAPAROSCOPY     exploratory for endometriosis   HEEL SPUR SURGERY     Right, Metal Plate in Heel   [place]  2010   NEPHRECTOMY Left 4782   As complication of partial colectomy for diverticulitis   POLYPECTOMY  2013   TA   TONSILLECTOMY     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   Procedure: LEFT TOTAL KNEE ARTHROPLASTY;  Surgeon: Susa Day, MD;  Location: [facility];  Service: Orthopedics;  Laterality: Left;  Adductor Block   Social History:  reports that she has never smoked. She has never used smokeless tobacco. She reports that she does not currently use alcohol. She reports that she does not use drugs.  Allergies  Allergen Reactions   Haloperidol Anaphylaxis  Aripiprazole Other (See Comments)    Bad thoughts    Chlorpheniramine-Pse-Ibuprofen     Other reaction(s): Cough    Family History  Problem Relation Age of Onset   Hypertension Mother    Heart failure Mother    Hyperlipidemia Mother    Stroke Mother    Hypertension Father    Heart attack Father    COPD Father    Diabetes Paternal Aunt    Diabetes Paternal Grandmother    Colon polyps Other        niece   Stomach cancer Neg Hx    Rectal cancer Neg Hx    Esophageal cancer Neg Hx    Colon cancer Neg Hx     Prior to Admission medications   Medication Sig Start Date End Date Taking? Authorizing Provider  acetaminophen (TYLENOL) 325 MG tablet Take 650 mg by mouth every 6 (six) hours as needed.    [provider]   amantadine (SYMMETREL) 100 MG capsule Take 100 mg by mouth 2 (two) times daily. 05/03/21   [provider]  antiseptic oral rinse (BIOTENE) LIQD 15 mLs by Mouth Rinse route as needed for dry mouth.    [provider]  BREO ELLIPTA 200-25 MCG/INH AEPB Inhale 1 puff into the lungs daily. 06/22/20   [provider]  cetirizine (ZYRTEC) 10 MG tablet Take 10 mg by mouth daily.    [provider]  cyanocobalamin 1000 MCG tablet Take 1,000 mcg by mouth daily.    [provider]  fluticasone (FLONASE) 50 MCG/ACT nasal spray Place 2 sprays into both nostrils daily. 05/02/21   [provider]  gabapentin (NEURONTIN) 100 MG capsule Take 100 mg by mouth at bedtime.    [provider]  melatonin 3 MG TABS tablet Take 3 mg by mouth at bedtime.    [provider]  midodrine (PROAMATINE) 5 MG tablet Take 1 tablet (5 mg total) by mouth 2 (two) times daily with a meal. 01/23/22   Thurnell Lose, MD  mirtazapine (REMERON) 7.5 MG tablet Take 1 tablet (7.5 mg total) by mouth at bedtime. 01/23/22   Thurnell Lose, MD  Multiple Vitamins-Minerals (PRESERVISION AREDS) TABS Take 1 tablet by mouth in the morning and at bedtime.    [provider]  omeprazole (PRILOSEC) 20 MG capsule Take 1 capsule (20 mg total) by mouth daily. 06/19/19   Palumbo, April, MD  risperiDONE (RISPERDAL) 1 MG tablet Take 1 tablet (1 mg total) by mouth at bedtime. 10/27/21   Johnson, Clanford L, MD  valbenazine (INGREZZA) 40 MG capsule Take 1 capsule (40 mg total) by mouth daily. 01/23/22 02/22/22  France Ravens, MD    Physical Exam: Vitals:   01/29/22 2200 01/29/22 2209 01/29/22 2215 01/29/22 2221  BP: 126/69     Pulse: 86 97 88 85  Resp:      Temp:      TempSrc:      SpO2:  (!) 77% (!) 87% 94%   1.  General: Patient lying supine in bed,  no acute distress   2. Psychiatric: Alert and oriented x 3, mood and behavior normal for situation, pleasant and  cooperative with exam   3. Neurologic: Speech and language are normal, face is symmetric, moves all 4 extremities voluntarily, at baseline without acute deficits on limited exam   4. HEENMT:  3 staples above right eyebrow, normocephalic, pupils reactive to light, neck is supple, trachea is midline, mucous membranes are moist   5.  Respiratory : Lungs are clear to auscultation bilaterally without wheezing, rhonchi, rales, no cyanosis, no increase in work of breathing or accessory muscle use   6. Cardiovascular : Heart rate normal, rhythm is regular, no murmurs, rubs or gallops, no peripheral edema, peripheral pulses palpated   7. Gastrointestinal:  Abdomen is soft, nondistended, nontender to palpation bowel sounds active, no masses or organomegaly palpated   8. Skin:  Skin is warm, dry and intact without rashes, acute lesions, or ulcers on limited exam   9.Musculoskeletal:  Minimal swelling of right wrist, no asymmetry in tone, no peripheral edema, peripheral pulses palpated, no tenderness to palpation in the extremities  Data Reviewed: In the ED Temp 98.2, heart rate 80-89, respiratory rate 15-19, blood pressure 124/65-151/78 Leukocytosis at 13.3, hemoglobin stable at 12.3, platelets 301 Chemistry is unremarkable CT head shows acute subdural hematoma primarily layering along the left tentorial leaflet measuring up to 6 mm without mass effect or parenchymal edema.  No traumatic changes of the cervical spine Wrist x-ray shows degenerative changes without acute fracture or dislocation Patient had lack repair Admission requested as neurosurgery recommended observation overnight with repeat CT head in the a.m.  Assessment and Plan: * Subdural hematoma (HCC) - 6 mm hematoma on CT - Neurosurgery consulted and advises observation overnight with repeat CT head in the a.m. - Neurochecks - [facility] to [facility] with bed to become available - Continue monitor  Leukocytosis - 13.3 - Likely  reactive in setting of acute fall - UA pending - No respiratory symptoms - Continue to monitor  Fall at home, initial encounter -light headed prior to fall -Possibly near syncopal event -Did not lose consciousness -Did hit head -Not on blood thinners -CT scan shows sundural hematoma - see associated assessment and plan -Echo in the AM for near syncope -PT eval and treat   Paranoid schizophrenia (HCC) - Continue Remeron, Risperdal, Ingrezza, Symmetrel - Continue to monitor  Gastroesophageal reflux disease without esophagitis - Continue Protonix      Advance Care Planning:   Code Status: Full Code  Consults: Neurosurgery  Family Communication: No family at bedside  Severity of Illness: The appropriate patient status for this patient is OBSERVATION. Observation status is judged to be reasonable and necessary in order to provide the required intensity of service to ensure the patient's safety. The patient's presenting symptoms, physical exam findings, and initial radiographic and laboratory data in the context of their medical condition is felt to place them at decreased risk for further clinical deterioration. Furthermore, it is anticipated that the patient will be medically stable for discharge from the hospital within 2 midnights of admission.   Author: Rolla Plate, DO 01/29/2022 10:54 PM  For on call review [email or web address].

## 2022-01-29 NOTE — ED Notes (Signed)
Rn to room- pt was asleep and sats were 78%. When pt was woken up, sats went up to 89%. When asked if she had sleep apnea, pt said yes. Pt then was asked if she wears a mask at night, she declined.  Placed on 3l . 94% while awake.

## 2022-01-29 NOTE — Assessment & Plan Note (Signed)
Continue Protonix °

## 2022-01-30 ENCOUNTER — Encounter (HOSPITAL_COMMUNITY): Payer: Self-pay | Admitting: Family Medicine

## 2022-01-30 ENCOUNTER — Observation Stay (HOSPITAL_COMMUNITY): Payer: Medicare Other

## 2022-01-30 ENCOUNTER — Observation Stay (HOSPITAL_BASED_OUTPATIENT_CLINIC_OR_DEPARTMENT_OTHER): Payer: Medicare Other

## 2022-01-30 ENCOUNTER — Other Ambulatory Visit (HOSPITAL_COMMUNITY): Payer: Self-pay | Admitting: *Deleted

## 2022-01-30 DIAGNOSIS — R402142 Coma scale, eyes open, spontaneous, at arrival to emergency department: Secondary | ICD-10-CM | POA: Diagnosis present

## 2022-01-30 DIAGNOSIS — R402362 Coma scale, best motor response, obeys commands, at arrival to emergency department: Secondary | ICD-10-CM | POA: Diagnosis present

## 2022-01-30 DIAGNOSIS — Z9049 Acquired absence of other specified parts of digestive tract: Secondary | ICD-10-CM | POA: Diagnosis not present

## 2022-01-30 DIAGNOSIS — Z905 Acquired absence of kidney: Secondary | ICD-10-CM | POA: Diagnosis not present

## 2022-01-30 DIAGNOSIS — S01111A Laceration without foreign body of right eyelid and periocular area, initial encounter: Secondary | ICD-10-CM | POA: Diagnosis present

## 2022-01-30 DIAGNOSIS — F319 Bipolar disorder, unspecified: Secondary | ICD-10-CM | POA: Diagnosis present

## 2022-01-30 DIAGNOSIS — R55 Syncope and collapse: Secondary | ICD-10-CM

## 2022-01-30 DIAGNOSIS — N390 Urinary tract infection, site not specified: Secondary | ICD-10-CM | POA: Diagnosis present

## 2022-01-30 DIAGNOSIS — Z96652 Presence of left artificial knee joint: Secondary | ICD-10-CM | POA: Diagnosis present

## 2022-01-30 DIAGNOSIS — I5022 Chronic systolic (congestive) heart failure: Secondary | ICD-10-CM | POA: Diagnosis present

## 2022-01-30 DIAGNOSIS — W1839XA Other fall on same level, initial encounter: Secondary | ICD-10-CM | POA: Diagnosis present

## 2022-01-30 DIAGNOSIS — I9589 Other hypotension: Secondary | ICD-10-CM | POA: Diagnosis present

## 2022-01-30 DIAGNOSIS — S065XAA Traumatic subdural hemorrhage with loss of consciousness status unknown, initial encounter: Secondary | ICD-10-CM | POA: Diagnosis present

## 2022-01-30 DIAGNOSIS — K219 Gastro-esophageal reflux disease without esophagitis: Secondary | ICD-10-CM | POA: Diagnosis present

## 2022-01-30 DIAGNOSIS — F200 Paranoid schizophrenia: Secondary | ICD-10-CM | POA: Diagnosis present

## 2022-01-30 DIAGNOSIS — N1831 Chronic kidney disease, stage 3a: Secondary | ICD-10-CM | POA: Diagnosis present

## 2022-01-30 DIAGNOSIS — M25531 Pain in right wrist: Secondary | ICD-10-CM | POA: Diagnosis present

## 2022-01-30 DIAGNOSIS — Z86010 Personal history of colonic polyps: Secondary | ICD-10-CM | POA: Diagnosis not present

## 2022-01-30 DIAGNOSIS — Z825 Family history of asthma and other chronic lower respiratory diseases: Secondary | ICD-10-CM | POA: Diagnosis not present

## 2022-01-30 DIAGNOSIS — S065X0A Traumatic subdural hemorrhage without loss of consciousness, initial encounter: Secondary | ICD-10-CM | POA: Diagnosis present

## 2022-01-30 DIAGNOSIS — M1990 Unspecified osteoarthritis, unspecified site: Secondary | ICD-10-CM | POA: Diagnosis present

## 2022-01-30 DIAGNOSIS — K5900 Constipation, unspecified: Secondary | ICD-10-CM | POA: Diagnosis present

## 2022-01-30 DIAGNOSIS — J45909 Unspecified asthma, uncomplicated: Secondary | ICD-10-CM | POA: Diagnosis present

## 2022-01-30 DIAGNOSIS — Y92091 Bathroom in other non-institutional residence as the place of occurrence of the external cause: Secondary | ICD-10-CM | POA: Diagnosis not present

## 2022-01-30 DIAGNOSIS — Z8249 Family history of ischemic heart disease and other diseases of the circulatory system: Secondary | ICD-10-CM | POA: Diagnosis not present

## 2022-01-30 DIAGNOSIS — Z83719 Family history of colon polyps, unspecified: Secondary | ICD-10-CM | POA: Diagnosis not present

## 2022-01-30 DIAGNOSIS — F439 Reaction to severe stress, unspecified: Secondary | ICD-10-CM | POA: Diagnosis present

## 2022-01-30 DIAGNOSIS — R402252 Coma scale, best verbal response, oriented, at arrival to emergency department: Secondary | ICD-10-CM | POA: Diagnosis present

## 2022-01-30 LAB — URINALYSIS, ROUTINE W REFLEX MICROSCOPIC
Bilirubin Urine: NEGATIVE
Glucose, UA: NEGATIVE mg/dL
Ketones, ur: NEGATIVE mg/dL
Nitrite: POSITIVE — AB
Protein, ur: 100 mg/dL — AB
Specific Gravity, Urine: 1.013 (ref 1.005–1.030)
WBC, UA: >50 WBC/hpf — ABNORMAL HIGH (ref 0–5)
pH: 6 (ref 5.0–8.0)

## 2022-01-30 LAB — ECHOCARDIOGRAM COMPLETE
AR max vel: 2.92 cm2
AV Area VTI: 2.76 cm2
AV Area mean vel: 2.78 cm2
AV Mean grad: 4 mmHg
AV Peak grad: 7.2 mmHg
Ao pk vel: 1.34 m/s
Area-P 1/2: 5.97 cm2
Calc EF: 32.6 %
MV M vel: 4.63 m/s
MV Peak grad: 85.7 mmHg
S' Lateral: 5.2 cm
Single Plane A2C EF: 31.1 %
Single Plane A4C EF: 31.7 %

## 2022-01-30 LAB — COMPREHENSIVE METABOLIC PANEL
ALT: 16 U/L (ref 0–44)
AST: 13 U/L — ABNORMAL LOW (ref 15–41)
Albumin: 3.2 g/dL — ABNORMAL LOW (ref 3.5–5.0)
Alkaline Phosphatase: 77 U/L (ref 38–126)
Anion gap: 7 (ref 5–15)
BUN: 19 mg/dL (ref 8–23)
CO2: 28 mmol/L (ref 22–32)
Calcium: 8.6 mg/dL — ABNORMAL LOW (ref 8.9–10.3)
Chloride: 101 mmol/L (ref 98–111)
Creatinine, Ser: 1.01 mg/dL — ABNORMAL HIGH (ref 0.44–1.00)
GFR, Estimated: 57 mL/min — ABNORMAL LOW (ref >60)
Glucose, Bld: 96 mg/dL (ref 70–99)
Potassium: 4.4 mmol/L (ref 3.5–5.1)
Sodium: 136 mmol/L (ref 135–145)
Total Bilirubin: 0.5 mg/dL (ref 0.3–1.2)
Total Protein: 5.7 g/dL — ABNORMAL LOW (ref 6.5–8.1)

## 2022-01-30 LAB — CBC WITH DIFFERENTIAL/PLATELET
Abs Immature Granulocytes: 0.04 10*3/uL (ref 0.00–0.07)
Basophils Absolute: 0.1 10*3/uL (ref 0.0–0.1)
Basophils Relative: 1 %
Eosinophils Absolute: 0.1 10*3/uL (ref 0.0–0.5)
Eosinophils Relative: 1 %
HCT: 35.5 % — ABNORMAL LOW (ref 36.0–46.0)
Hemoglobin: 11.3 g/dL — ABNORMAL LOW (ref 12.0–15.0)
Immature Granulocytes: 0 %
Lymphocytes Relative: 9 %
Lymphs Abs: 0.9 10*3/uL (ref 0.7–4.0)
MCH: 30.1 pg (ref 26.0–34.0)
MCHC: 31.8 g/dL (ref 30.0–36.0)
MCV: 94.7 fL (ref 80.0–100.0)
Monocytes Absolute: 1.3 10*3/uL — ABNORMAL HIGH (ref 0.1–1.0)
Monocytes Relative: 12 %
Neutro Abs: 8.1 10*3/uL — ABNORMAL HIGH (ref 1.7–7.7)
Neutrophils Relative %: 77 %
Platelets: 265 10*3/uL (ref 150–400)
RBC: 3.75 MIL/uL — ABNORMAL LOW (ref 3.87–5.11)
RDW: 13 % (ref 11.5–15.5)
WBC: 10.4 10*3/uL (ref 4.0–10.5)
nRBC: 0 % (ref 0.0–0.2)

## 2022-01-30 LAB — MAGNESIUM: Magnesium: 2 mg/dL (ref 1.7–2.4)

## 2022-01-30 NOTE — Plan of Care (Signed)
  Problem: Education: Goal: Knowledge of General Education information will improve Description: Including pain rating scale, medication(s)/side effects and non-pharmacologic comfort measures Outcome: Progressing   Problem: Clinical Measurements: Goal: Ability to maintain clinical measurements within normal limits will improve Outcome: Progressing   

## 2022-01-30 NOTE — Hospital Course (Signed)
PMH of anxiety, CKD 3A, GERD, schizophrenia presented to hospital with complaints of mechanical fall.  Found to have SDH.  Neurosurgery was consulted currently recommend conservative measures.

## 2022-01-30 NOTE — Plan of Care (Signed)
  Problem: Acute Rehab PT Goals(only PT should resolve) Goal: Pt Will Go Supine/Side To Sit Outcome: Progressing Flowsheets (Taken 01/30/2022 1532) Pt will go Supine/Side to Sit:  with supervision  with min guard assist Goal: Patient Will Transfer Sit To/From Stand Outcome: Progressing Flowsheets (Taken 01/30/2022 1532) Patient will transfer sit to/from stand:  with supervision  with min guard assist Goal: Pt Will Transfer Bed To Chair/Chair To Bed Outcome: Progressing Flowsheets (Taken 01/30/2022 1532) Pt will Transfer Bed to Chair/Chair to Bed:  with supervision  min guard assist Goal: Pt Will Ambulate Outcome: Progressing Flowsheets (Taken 01/30/2022 1532) Pt will Ambulate:  50 feet  with min guard assist  with minimal assist  with rolling walker   3:32 PM, 01/30/22 Lonell Grandchild, MPT Physical Therapist with [facility] [phone] office [phone] mobile phone

## 2022-01-30 NOTE — TOC Initial Note (Addendum)
Transition of Care [facility]) - Initial/Assessment Note    Patient Details  Name: [name] MRN: [id number] Date of Birth: 11/02/1940  [facility] of [facility]) CM/SW Contact:    Iona Beard, [place] Phone Number: 01/30/2022, 1:48 PM  Clinical Narrative:                 Pt admitted from [facility] assisted living. CSW spoke to [place] with [facility] who states pt has been with them for about a year. Pt was recently at [facility] and at discharge she needed a walker to assist with walking. CSW left voicemail with legal guardian. TOC to follow for needs.   Addendum: CSW updated by PT that pt is recommended for SNF if ALF cannot manage min assist with transfers and sitting up. CSW spoke to [place] with ALF who states they can manage pts care as this is her baseline with them. CSW to follow up with pts legal guardian once return call has been made.   Expected Discharge Plan: Assisted Living Barriers to Discharge: Continued Medical Work up   Patient Goals and CMS Choice Patient states their goals for this hospitalization and ongoing recovery are:: return to ALF CMS Medicare.gov Compare Post Acute Care list provided to:: Patient Choice offered to / list presented to : Patient  Expected Discharge Plan and Services Expected Discharge Plan: Assisted Living In-house Referral: Clinical Social Work Discharge Planning Services: CM Consult Post Acute Care Choice: Resumption of Svcs/PTA Provider Living arrangements for the past 2 months: [place]                                      Prior Living Arrangements/Services Living arrangements for the past 2 months: [place] Lives with:: Facility Resident Patient language and need for interpreter reviewed:: Yes Do you feel safe going back to the place where you live?: Yes      Need for Family Participation in Patient Care: Yes (Comment) Care giver support system in place?: Yes (comment)   Criminal  Activity/Legal Involvement Pertinent to Current Situation/Hospitalization: No - Comment as needed  Activities of Daily Living      Permission Sought/Granted                  Emotional Assessment Appearance:: Appears stated age       Alcohol / Substance Use: Not Applicable Psych Involvement: No (comment)  Admission diagnosis:  Subdural hematoma ([place]) [S06.5XAA] Patient Active Problem List   Diagnosis Date Noted   Subdural hematoma ([place]) 01/29/2022   Fall at home, initial encounter 01/29/2022   Leukocytosis 01/29/2022   SIADH (syndrome of inappropriate ADH production) ([place]) 02/03/2535   Systolic heart failure ([place]) 01/16/2022   CKD (chronic kidney disease), stage II 01/16/2022   Hyponatremia 01/15/2022   Pressure injury of skin 01/15/2022   hyperthyroidism  64/40/3474   Acute metabolic encephalopathy - RESOLVED 10/24/2021   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   Elevated troponin 10/24/2021   Pain due to onychomycosis of toenails of both feet 09/12/2020   Chronic low back pain without sciatica 10/02/2017   Essential hypertension 10/02/2017   Paranoid schizophrenia ([place]) 09/19/2017   Delusional disorder, 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[place]) 09/18/2017   Behavioral change 09/18/2017   Severe manic bipolar 1 disorder with psychotic behavior ([place]) 08/01/2017   History of left inguinal hernia repair 08/06/2016   H/O total knee replacement, left 06/19/2016   Primary osteoarthritis  of left knee 04/04/2016   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/2016   Anxiety 11/15/2015   Gastroesophageal reflux disease without esophagitis 11/15/2015   Solitary right kidney 07/07/2015   Urge incontinence of urine 07/07/2015   Asthma 12/07/2011   Arthritis 12/07/2011   Thoracic spondylosis - mild to moderate 2009 12/07/2011   Obesity  12/07/2011   Incisional hernia 03/29/2011   DIVERTICULOSIS-COLON 12/01/2008   ABDOMINAL PAIN-RLQ 12/01/2008   PCP:  Lubertha Sayres, FNP Pharmacy:   [id number] [place]Phone: [phone]: [phone] [address] [address]Phone: [phone]: [phone] [facility] [address]Phone: [phone]: [phone]    Social Determinants of Health (SDOH) Interventions    Readmission Risk Interventions     No data to display

## 2022-01-30 NOTE — Progress Notes (Signed)
Pt came from ED to 300 via wheelchair. Pt was able to ambulate to bathroom and chair with assistance. She complains of burning sensation when urinating, urine is cloudy. Patient is A&Ox4, pleasant and has no other complaints or in distress at this time. Vitals obtained and urine sample collected to be sent to lab.

## 2022-01-30 NOTE — ED Notes (Signed)
PT working with the pt

## 2022-01-30 NOTE — ED Notes (Signed)
Change and Clean the bed and the Pt. Put a purewick on pt. And gave breakfast tray.

## 2022-01-30 NOTE — Progress Notes (Signed)
*  PRELIMINARY RESULTS* Echocardiogram 2D Echocardiogram has been performed.  [name] 01/30/2022, 10:26 AM

## 2022-01-30 NOTE — Evaluation (Signed)
Physical Therapy Evaluation Patient Details Name: [name] MRN: [id number] DOB: Apr 12, 1940 Today's Date: 01/30/2022  History of Present Illness  [name] is a 81 y.o. female with medical history significant of anxiety, CKD, schizophrenia and bipolar disorder, GERD, and more presents the ED with a chief complaint of fall.  Patient reports that she was in her normal state of health.  She ambulates with a walker, and she had gotten up to go to the bathroom.  When she got to the bathroom she turned around to turn on the light, felt lightheaded and then was on the ground.  Patient reports she did not lose consciousness.  She did hit her head over her right eyebrow.  She is not on blood thinners.  Patient denies chest pain and loss of consciousness.  She did admit to palpitations after the fall, while laying on the floor.  Patient reports no change in vision, no change in hearing.  She has not had any asymmetric weakness.  She denies difficulty swallowing or speaking.  Patient did hurt her right wrist that she tried to catch herself.  She reports she has sharp pain in her right wrist.  X-ray did not show acute fracture.  On review of systems patient does complain of dryness of her mouth especially at night.  This is likely from medications.  It has been ongoing.  Patient has no other complaints at this time.   Clinical Impression  Patient demonstrates labored movement for sitting up at bedside requiring Min/mod assist due to c/o generalized pain all over body, limited to a few slow labored steps at bedside before requesting to sit due to c/o fatigue and tolerated sitting up in chair after therapy - RN aware.  Patient will benefit from continued skilled physical therapy in hospital and recommended venue below to increase strength, balance, endurance for safe ADLs and gait.        Recommendations for follow up therapy are one component of a multi-disciplinary discharge planning process, led by  the attending physician.  Recommendations may be updated based on patient status, additional functional criteria and insurance authorization.  Follow Up Recommendations Skilled nursing-short term rehab (<3 hours/day) Can patient physically be transported by private vehicle: Yes    Assistance Recommended at Discharge Set up Supervision/Assistance  Patient can return home with the following  A little help with walking and/or transfers;A little help with bathing/dressing/bathroom;Help with stairs or ramp for entrance;Assistance with cooking/housework    Equipment Recommendations None recommended by PT  Recommendations for Other Services       Functional Status Assessment Patient has had a recent decline in their functional status and demonstrates the ability to make significant improvements in function in a reasonable and predictable amount of time.     Precautions / Restrictions Precautions Precautions: Fall Restrictions Weight Bearing Restrictions: No      Mobility  Bed Mobility Overal bed mobility: Needs Assistance Bed Mobility: Supine to Sit     Supine to sit: Min assist, Mod assist, HOB elevated     General bed mobility comments: increased time, labored movement    Transfers Overall transfer level: Needs assistance Equipment used: Rolling walker (2 wheels) Transfers: Sit to/from Stand, Bed to chair/wheelchair/BSC Sit to Stand: Min assist   Step pivot transfers: Min assist       General transfer comment: increased time, slow labored movement    Ambulation/Gait Ambulation/Gait assistance: Min assist Gait Distance (Feet): 5 Feet Assistive device: Rolling walker (2 wheels) Gait  Pattern/deviations: Decreased step length - right, Decreased step length - left, Decreased stride length Gait velocity: decreased     General Gait Details: limited to a few slow labored side steps at bedside mostly due c/o fatigue  Stairs            Wheelchair Mobility     Modified Rankin (Stroke Patients Only)       Balance Overall balance assessment: Needs assistance Sitting-balance support: Feet supported, No upper extremity supported Sitting balance-Leahy Scale: Fair Sitting balance - Comments: fair/good seated at EOB   Standing balance support: During functional activity, Bilateral upper extremity supported Standing balance-Leahy Scale: Fair Standing balance comment: fair/good using RW                             Pertinent Vitals/Pain Pain Assessment Pain Assessment: Faces Faces Pain Scale: Hurts little more Pain Location: c/o generalized pain all over body Pain Descriptors / Indicators: Sore Pain Intervention(s): Limited activity within patient's tolerance, Monitored during session, Repositioned    Home Living Family/patient expects to be discharged to:: Assisted living                        Prior Function Prior Level of Function : Needs assist       Physical Assist : Mobility (physical);ADLs (physical)     Mobility Comments: household ambulator using RW ADLs Comments: Assisted by ALF staff     Hand Dominance   Dominant Hand: Right    Extremity/Trunk Assessment   Upper Extremity Assessment Upper Extremity Assessment: Generalized weakness    Lower Extremity Assessment Lower Extremity Assessment: Generalized weakness    Cervical / Trunk Assessment Cervical / Trunk Assessment: Kyphotic  Communication   Communication: No difficulties  Cognition Arousal/Alertness: Awake/alert Behavior During Therapy: WFL for tasks assessed/performed Overall Cognitive Status: Within Functional Limits for tasks assessed                                          General Comments      Exercises     Assessment/Plan    PT Assessment Patient needs continued PT services  PT Problem List Decreased strength;Decreased activity tolerance;Decreased balance;Decreased mobility       PT Treatment  Interventions DME instruction;Gait training;Stair training;Functional mobility training;Therapeutic activities;Therapeutic exercise;Patient/family education;Balance training    PT Goals (Current goals can be found in the Care Plan section)  Acute Rehab PT Goals Patient Stated Goal: return to ALF after rehab PT Goal Formulation: With patient Time For Goal Achievement: 02/13/22 Potential to Achieve Goals: Good    Frequency Min 3X/week     Co-evaluation               AM-PAC PT "6 Clicks" Mobility  Outcome Measure Help needed turning from your back to your side while in a flat bed without using bedrails?: A Little Help needed moving from lying on your back to sitting on the side of a flat bed without using bedrails?: A Lot Help needed moving to and from a bed to a chair (including a wheelchair)?: A Little Help needed standing up from a chair using your arms (e.g., wheelchair or bedside chair)?: A Little Help needed to walk in hospital room?: A Little Help needed climbing 3-5 steps with a railing? : A Lot 6 Click Score: 16  End of Session   Activity Tolerance: Patient tolerated treatment well;Patient limited by fatigue Patient left: in chair;with call bell/phone within reach Nurse Communication: Mobility status PT Visit Diagnosis: Other abnormalities of gait and mobility (R26.89);Muscle weakness (generalized) (M62.81);Unsteadiness on feet (R26.81)    Time: 1610-9604 PT Time Calculation (min) (ACUTE ONLY): 23 min   Charges:   PT Evaluation $PT Eval Moderate Complexity: 1 Mod PT Treatments $Therapeutic Activity: 23-37 mins        3:30 PM, 01/30/22 Lonell Grandchild, MPT Physical Therapist with [facility] [phone] office [phone] mobile phone

## 2022-01-30 NOTE — Progress Notes (Signed)
  Progress Note Patient: [name] [id number]:[id number] DOB: Jul 28, 1940 DOA: 01/29/2022  DOS: the patient was seen and examined on 01/30/2022  Brief hospital course: PMH of anxiety, CKD 3A, GERD, schizophrenia presented to hospital with complaints of mechanical fall.  Found to have SDH.  Neurosurgery was consulted currently recommend conservative measures.  Assessment and Plan: Subdural hematoma (HCC) Small left tentorial subdural hematoma. No mass effect no edema. No focal deficit at the time of my evaluation. Discussed with neurosurgery Dr. Venetia Constable, recommend no further work-up necessary and recommend outpatient follow-up. Also recommended the patient actually stay at [facility].  No need for transfer to [facility]. Continue serial neurochecks. PT OT and speech consultation. Monitor.  Leukocytosis Likely stress reaction. Monitor for now.   Fall at home, initial encounter Patient tells me that she was in the bathroom and suddenly like a whirlwind she fell down. Denies any focal deficit after the fall. Denies any prior similar episode. Echocardiogram ordered. Monitor on telemetry.  Scalp injury. Patient has stitches. Will require removal in 7 to 10 days.   Paranoid schizophrenia ([place]) Continue Remeron, Risperdal, Ingrezza, Symmetrel   Gastroesophageal reflux disease without esophagitis Continue Protonix   Subjective: No nausea no vomiting no fever no chills.  Reports pain in her eyelid area where the stitches are.  Physical Exam: Vitals:   01/30/22 1300 01/30/22 1400 01/30/22 1415 01/30/22 1524  BP: 123/84 120/66  114/69  Pulse: 77  88 77  Resp:   16 14  Temp:    99.2 F (37.3 C)  TempSrc:      SpO2: 93%  95% 96%   General: Appear in mild distress; no visible Abnormal Neck Mass Or lumps, Conjunctiva normal Cardiovascular: S1 and S2 Present, no Murmur, Respiratory: good respiratory effort, Bilateral Air entry present and CTA, no Crackles, no  wheezes Abdomen: Bowel Sound present, Non tender  Extremities: no Pedal edema Neurology: alert and oriented to time, place, and person Gait not checked due to patient safety concerns   Data Reviewed: I have Reviewed nursing notes, Vitals, and Lab results since pt's last encounter. Pertinent lab results CBC and BMP I have ordered test including CBC and BMP I have discussed pt's care plan and test results with neurosurgery Dr. Venetia Constable.   Family Communication: No one at bedside  Disposition: Status is: Observation  SCDs Start: 01/29/22 2244   Level of care: Telemetry continue due to presentation with subdural hematoma. Author: Berle Mull, MD 01/30/2022 6:53 PM Please look on [email or web address] to find out who is on call.

## 2022-01-30 NOTE — ED Notes (Signed)
Patient transported to CT 

## 2022-01-31 ENCOUNTER — Inpatient Hospital Stay (HOSPITAL_COMMUNITY): Payer: Medicare Other

## 2022-01-31 ENCOUNTER — Ambulatory Visit: Payer: Medicare Other | Admitting: Nurse Practitioner

## 2022-01-31 DIAGNOSIS — S065XAA Traumatic subdural hemorrhage with loss of consciousness status unknown, initial encounter: Secondary | ICD-10-CM | POA: Diagnosis not present

## 2022-01-31 LAB — BASIC METABOLIC PANEL
Anion gap: 9 (ref 5–15)
BUN: 19 mg/dL (ref 8–23)
CO2: 25 mmol/L (ref 22–32)
Calcium: 8.5 mg/dL — ABNORMAL LOW (ref 8.9–10.3)
Chloride: 99 mmol/L (ref 98–111)
Creatinine, Ser: 0.97 mg/dL (ref 0.44–1.00)
GFR, Estimated: 59 mL/min — ABNORMAL LOW (ref >60)
Glucose, Bld: 92 mg/dL (ref 70–99)
Potassium: 4.2 mmol/L (ref 3.5–5.1)
Sodium: 133 mmol/L — ABNORMAL LOW (ref 135–145)

## 2022-01-31 LAB — CBC
HCT: 35 % — ABNORMAL LOW (ref 36.0–46.0)
Hemoglobin: 11.4 g/dL — ABNORMAL LOW (ref 12.0–15.0)
MCH: 30.2 pg (ref 26.0–34.0)
MCHC: 32.6 g/dL (ref 30.0–36.0)
MCV: 92.8 fL (ref 80.0–100.0)
Platelets: 256 10*3/uL (ref 150–400)
RBC: 3.77 MIL/uL — ABNORMAL LOW (ref 3.87–5.11)
RDW: 12.9 % (ref 11.5–15.5)
WBC: 8.7 10*3/uL (ref 4.0–10.5)
nRBC: 0 % (ref 0.0–0.2)

## 2022-01-31 MED ORDER — SODIUM CHLORIDE 0.9 % IV SOLN
1.0000 g | INTRAVENOUS | Status: DC
  Administered 2022-01-31 – 2022-02-01 (×2): 1 g via INTRAVENOUS
  Filled 2022-01-31 (×2): qty 10

## 2022-01-31 MED ORDER — NAPHAZOLINE-GLYCERIN 0.012-0.25 % OP SOLN: 1.0000 [drp] | Freq: Four times a day (QID) | OPHTHALMIC | Status: DC | PRN

## 2022-01-31 MED ORDER — POLYETHYLENE GLYCOL 3350 17 G PO PACK
17.0000 g | PACK | Freq: Every day | ORAL | Status: DC
  Administered 2022-01-31 – 2022-02-02 (×3): 17 g via ORAL
  Filled 2022-01-31 (×3): qty 1

## 2022-01-31 MED ORDER — SENNOSIDES-DOCUSATE SODIUM 8.6-50 MG PO TABS
1.0000 | ORAL_TABLET | Freq: Two times a day (BID) | ORAL | Status: AC
  Administered 2022-01-31 – 2022-02-01 (×4): 1 via ORAL
  Filled 2022-01-31 (×4): qty 1

## 2022-01-31 NOTE — Progress Notes (Signed)
  Progress Note Patient: [name] SHF:[id number] DOB: [birth year] DOA: 01/29/2022  DOS: the patient was seen and examined on 01/31/2022  Brief hospital course: PMH of anxiety, CKD 3A, GERD, schizophrenia presented to hospital with complaints of mechanical fall.  Found to have SDH.  Neurosurgery was consulted currently recommend conservative measures.  Assessment and Plan:  Subdural hematoma (HCC) Small left tentorial subdural hematoma  No mass effect no edema. No focal deficit at the time of my evaluation. Discussed with neurosurgery Dr. Venetia Constable, recommend no further work-up necessary and recommend outpatient follow-up. Also recommended the patient actually stay at [facility].  No need for transfer to [facility]. Continue serial neurochecks. Seen by PT who recommended return to ALF with HH    Scalp injury. Patient has staples Will require removal in 7 to 10 days.  UTI: Leukocytosis Likely stress reaction, reports urinary symptoms , turbid urine with large leuk and + nitrite, many bacteria Urine culture  rocephin F/u on urine culture   Fall at home, initial encounter Patient tells me that she was in the bathroom and suddenly like a whirlwind she fell down. Denies any focal deficit after the fall. Denies any prior similar episode. Echocardiogram showed reduced ef at 35% Monitor on telemetry.  Chronic systolic chf Compensated  Reports her cardiologist is Dr Radford Pax   Paranoid schizophrenia ([place]) Continue Remeron, Risperdal, Ingrezza, Symmetrel   Gastroesophageal reflux disease without esophagitis Continue Protonix   Subjective: urinary symptoms , right eye is burning, reports she uses clear eyes, reports right side flank pain after the fall, reports being constipated No nausea no vomiting no fever no chills.   Physical Exam: Vitals:   01/30/22 2008 01/30/22 2346 01/31/22 0717 01/31/22 0818  BP: 125/78 (!) 141/83  113/79  Pulse: 96 99  76  Resp: '18  16  14  '$ Temp: (!) 96.9 F (36.1 C) (!) 97.1 F (36.2 C)  98.3 F (36.8 C)  TempSrc:    Oral  SpO2: 95% 96% 93% 98%   General: appear weak, but NAD, aaox3 Cardiovascular: S1 and S2 Present, no Murmur, Respiratory: good respiratory effort, Bilateral Air entry present and CTA, no Crackles, no wheezes Abdomen: Bowel Sound present, Non tender , mild suprapubic tenderness Extremities: no Pedal edema Neurology: alert and oriented to time, place, and person Gait not checked due to patient safety concerns   Data Reviewed: I have Reviewed nursing notes, Vitals, and Lab results since pt's last encounter. Pertinent lab results CBC and BMP I have ordered test including CBC and BMP I have discussed pt's care plan and test results with neurosurgery Dr. Venetia Constable.   Family Communication: No one at bedside  Disposition: Return to ALF with [facility] once medically stable ,currently reports urinary symptom, urine culture pending,   SCDs Start: 01/29/22 2244   Level of care: Telemetry continue due to presentation with subdural hematoma. Author: Florencia Reasons, MD PhD FACP 01/31/2022 11:42 AM Please look on [email or web address] to find out who is on call.

## 2022-01-31 NOTE — Progress Notes (Signed)
Physical Therapy Treatment Patient Details Name: [name] MRN: [id number] DOB: 06/28/1940 Today's Date: 01/31/2022   History of Present Illness [name] is a 80 y.o. female with medical history significant of anxiety, CKD, schizophrenia and bipolar disorder, GERD, and more presents the ED with a chief complaint of fall.  Patient reports that she was in her normal state of health.  She ambulates with a walker, and she had gotten up to go to the bathroom.  When she got to the bathroom she turned around to turn on the light, felt lightheaded and then was on the ground.  Patient reports she did not lose consciousness.  She did hit her head over her right eyebrow.  She is not on blood thinners.  Patient denies chest pain and loss of consciousness.  She did admit to palpitations after the fall, while laying on the floor.  Patient reports no change in vision, no change in hearing.  She has not had any asymmetric weakness.  She denies difficulty swallowing or speaking.  Patient did hurt her right wrist that she tried to catch herself.  She reports she has sharp pain in her right wrist.  X-ray did not show acute fracture.  On review of systems patient does complain of dryness of her mouth especially at night.  This is likely from medications.  It has been ongoing.  Patient has no other complaints at this time.    PT Comments    Excellent progress towards functional goals which have been met and further updated for greater independence with aim for return to ALF with HHPT at d/c. Ambulated 85 feet with RW. No loss of balance while using AD with sit<>stand and pivot transfers today. Oriented x4. States she would prefer to return to ALF and receive some additional PT there and I anticipate she will continue to improve and will have adequate support at ALF. Will continue to follow and monitor for functional changes. Recs will be updated as appropriate. Patient will continue to benefit from skilled  physical therapy services to further improve independence with functional mobility..    Recommendations for follow up therapy are one component of a multi-disciplinary discharge planning process, led by the attending physician.  Recommendations may be updated based on patient status, additional functional criteria and insurance authorization.  Follow Up Recommendations  Home health PT (Return to ALF) Can patient physically be transported by private vehicle: Yes   Assistance Recommended at Discharge Set up Supervision/Assistance  Patient can return home with the following A little help with walking and/or transfers;A little help with bathing/dressing/bathroom;Help with stairs or ramp for entrance;Assistance with cooking/housework;Assist for transportation   Equipment Recommendations  None recommended by PT    Recommendations for Other Services       Precautions / Restrictions Precautions Precautions: Fall Restrictions Weight Bearing Restrictions: No     Mobility  Bed Mobility Overal bed mobility: Needs Assistance Bed Mobility: Supine to Sit     Supine to sit: Supervision, HOB elevated     General bed mobility comments: Supervision for safety, no physical assist required.    Transfers Overall transfer level: Needs assistance Equipment used: Rolling walker (2 wheels) Transfers: Sit to/from Stand, Bed to chair/wheelchair/BSC Sit to Stand: Min guard Stand pivot transfers: Min guard         General transfer comment: min guard for safety with sit<>stand cues for hand placement. Pivoted to BSC due to urinary urgency. min guard with pivot.    Ambulation/Gait Ambulation/Gait   assistance: Supervision Gait Distance (Feet): 85 Feet Assistive device: Rolling walker (2 wheels) Gait Pattern/deviations: Decreased step length - right, Decreased step length - left, Decreased stride length Gait velocity: decreased Gait velocity interpretation: <1.8 ft/sec, indicate of risk for  recurrent falls   General Gait Details: Slower gait but stable with use of RW for support. No overt LOB or gross instability noted while using AD (Baseline report). Supervison for safety throughout distance, cues for larger step and gait symmerty as able.   Stairs             Wheelchair Mobility    Modified Rankin (Stroke Patients Only)       Balance Overall balance assessment: Needs assistance Sitting-balance support: Feet supported, No upper extremity supported, Feet unsupported Sitting balance-Leahy Scale: Good Sitting balance - Comments: EOB and BSC without LOB   Standing balance support: During functional activity, No upper extremity supported Standing balance-Leahy Scale: Fair Standing balance comment: Able to perform self pericare after voiding                            Cognition Arousal/Alertness: Awake/alert Behavior During Therapy: WFL for tasks assessed/performed Overall Cognitive Status: Within Functional Limits for tasks assessed                                 General Comments: WNL, fully oriented.        Exercises      General Comments        Pertinent Vitals/Pain Pain Assessment Pain Assessment: Faces Faces Pain Scale: Hurts little more Pain Location: back Pain Descriptors / Indicators: Sore Pain Intervention(s): Monitored during session, Repositioned    Home Living                          Prior Function            PT Goals (current goals can now be found in the care plan section) Acute Rehab PT Goals Patient Stated Goal: Go back to ALF PT Goal Formulation: With patient Time For Goal Achievement: 02/13/22 Potential to Achieve Goals: Good Progress towards PT goals: Progressing toward goals    Frequency    Min 3X/week      PT Plan Discharge plan needs to be updated    Co-evaluation              AM-PAC PT "6 Clicks" Mobility   Outcome Measure  Help needed turning from your  back to your side while in a flat bed without using bedrails?: A Little Help needed moving from lying on your back to sitting on the side of a flat bed without using bedrails?: A Little Help needed moving to and from a bed to a chair (including a wheelchair)?: A Little Help needed standing up from a chair using your arms (e.g., wheelchair or bedside chair)?: A Little Help needed to walk in hospital room?: A Little Help needed climbing 3-5 steps with a railing? : A Little 6 Click Score: 18    End of Session Equipment Utilized During Treatment: Gait belt Activity Tolerance: Patient tolerated treatment well Patient left: in chair;with call bell/phone within reach;with chair alarm set Nurse Communication: Mobility status PT Visit Diagnosis: Other abnormalities of gait and mobility (R26.89);Muscle weakness (generalized) (M62.81);Unsteadiness on feet (R26.81)     Time: 0846-0901 PT Time Calculation (min) (ACUTE ONLY):   15 min  Charges:  $Gait Training: 8-22 mins                     Candie Mile, PT, DPT Physical Therapist Acute Rehabilitation Services [facility] 01/31/2022, 9:36 AM

## 2022-02-01 ENCOUNTER — Inpatient Hospital Stay (HOSPITAL_COMMUNITY): Payer: Medicare Other

## 2022-02-01 LAB — CBC
HCT: 36.5 % (ref 36.0–46.0)
Hemoglobin: 11.7 g/dL — ABNORMAL LOW (ref 12.0–15.0)
MCH: 30.2 pg (ref 26.0–34.0)
MCHC: 32.1 g/dL (ref 30.0–36.0)
MCV: 94.1 fL (ref 80.0–100.0)
Platelets: 226 10*3/uL (ref 150–400)
RBC: 3.88 MIL/uL (ref 3.87–5.11)
RDW: 13 % (ref 11.5–15.5)
WBC: 7.5 10*3/uL (ref 4.0–10.5)
nRBC: 0 % (ref 0.0–0.2)

## 2022-02-01 LAB — BASIC METABOLIC PANEL
Anion gap: 8 (ref 5–15)
BUN: 23 mg/dL (ref 8–23)
CO2: 27 mmol/L (ref 22–32)
Calcium: 8.5 mg/dL — ABNORMAL LOW (ref 8.9–10.3)
Chloride: 97 mmol/L — ABNORMAL LOW (ref 98–111)
Creatinine, Ser: 1.31 mg/dL — ABNORMAL HIGH (ref 0.44–1.00)
GFR, Estimated: 41 mL/min — ABNORMAL LOW (ref >60)
Glucose, Bld: 81 mg/dL (ref 70–99)
Potassium: 4.6 mmol/L (ref 3.5–5.1)
Sodium: 132 mmol/L — ABNORMAL LOW (ref 135–145)

## 2022-02-01 NOTE — Progress Notes (Addendum)
Progress Note Patient: [name] DGU:[id number] DOB: 1940/09/20 DOA: 01/29/2022  DOS: the patient was seen and examined on 02/01/2022  Brief hospital course: PMH of anxiety, CKD 3A, GERD, schizophrenia presented to hospital with complaints of mechanical fall.  Found to have SDH.  Neurosurgery was consulted currently recommend conservative measures.  Assessment and Plan:  Subdural hematoma (HCC) Small left tentorial subdural hematoma  No mass effect no edema. No focal deficit at the time of my evaluation. Discussed with neurosurgery Dr. Venetia Constable, recommend no further work-up necessary and recommend outpatient follow-up. Also recommended the patient actually stay at [facility]  No need for transfer to [facility]. Continue serial neurochecks. Seen by PT who recommended return to ALF with [facility]    Facial /Right eyebrow laceration  S/p repair with staples on 10/23 Will require removal in 7 to 10 days.  Right flank pain X ray no fracture, need ct renal stone to rule out renal stone, ordered   UTI: Leukocytosis Likely stress reaction, reports urinary symptoms , turbid urine with large leuk and + nitrite, many bacteria Urine culture  rocephin F/u on urine culture   Fall at home, initial encounter Patient tells me that she was in the bathroom and suddenly like a whirlwind she fell down. Denies any focal deficit after the fall. Denies any prior similar episode. Echocardiogram showed reduced ef at 35% Monitor on telemetry.  Chronic systolic chf Compensated  Appear to have cardiac MRI scheduled in 02/2022 Reports her cardiologist is Dr Radford Pax  H/o SIADH, treated with hypertonic saline at [facility] in  early October 2023 On fluids restriction 1.2liter  Monitor sodium level  Chronic hypotension On midodrine   Paranoid schizophrenia ([place]) Continue Remeron, Risperdal, Ingrezza, Symmetrel   Gastroesophageal reflux disease without esophagitis Continue  Protonix   Subjective:   Continue to c/o right side flank pain, pain worse with movement, denies ab pain, continue to report urinary symptoms ,  right eye burning has resolved,  No nausea no vomiting no fever no chills. Urine culture in process, sodium 132, cr 1.31 States she walked in the hallway with PT this am  Physical Exam: Vitals:   01/31/22 1412 01/31/22 2018 02/01/22 0455 02/01/22 0752  BP: 110/75 110/65 (!) 102/58   Pulse: 80 78 66 72  Resp: '16 19 18 16  '$ Temp: (!) 97.3 F (36.3 C) 98.3 F (36.8 C) 98.3 F (36.8 C)   TempSrc: Oral     SpO2: 96% 95% 93% 94%   General: appear weak, but NAD, aaox3 Cardiovascular: S1 and S2 Present, no Murmur, Respiratory: good respiratory effort, Bilateral Air entry present and CTA, no Crackles, no wheezes Abdomen: Bowel Sound present, Non tender , mild suprapubic tenderness Extremities: no Pedal edema Neurology: alert and oriented to time, place, and person Gait not checked due to patient safety concerns   Data Reviewed: I have Reviewed nursing notes, Vitals, and Lab results since pt's last encounter. Pertinent lab results CBC and BMP I have ordered test including CBC and BMP I have discussed pt's care plan and test results with neurosurgery Dr. Venetia Constable.   Family Communication: No one at bedside  Disposition: Return to ALF with [facility] once medically stable ,possible d/c on 10/27 pending on urine culture and ct renal stone study ( right flank pain)   SCDs Start: 01/29/22 2244   Level of care: Telemetry continue due to presentation with subdural hematoma. Author: Florencia Reasons, MD PhD FACP 02/01/2022 12:33 PM Please look on [email or web address] to find  out who is on call.

## 2022-02-01 NOTE — Progress Notes (Signed)
Pt alert and oriented x 4, vitals stable and pt slept through the night. Edema and ecchymosis present around eye.

## 2022-02-02 LAB — BASIC METABOLIC PANEL
Anion gap: 9 (ref 5–15)
BUN: 23 mg/dL (ref 8–23)
CO2: 27 mmol/L (ref 22–32)
Calcium: 8.6 mg/dL — ABNORMAL LOW (ref 8.9–10.3)
Chloride: 99 mmol/L (ref 98–111)
Creatinine, Ser: 1.03 mg/dL — ABNORMAL HIGH (ref 0.44–1.00)
GFR, Estimated: 55 mL/min — ABNORMAL LOW (ref >60)
Glucose, Bld: 79 mg/dL (ref 70–99)
Potassium: 4.7 mmol/L (ref 3.5–5.1)
Sodium: 135 mmol/L (ref 135–145)

## 2022-02-02 LAB — CBC
HCT: 35.9 % — ABNORMAL LOW (ref 36.0–46.0)
Hemoglobin: 11.7 g/dL — ABNORMAL LOW (ref 12.0–15.0)
MCH: 30.3 pg (ref 26.0–34.0)
MCHC: 32.6 g/dL (ref 30.0–36.0)
MCV: 93 fL (ref 80.0–100.0)
Platelets: 257 10*3/uL (ref 150–400)
RBC: 3.86 MIL/uL — ABNORMAL LOW (ref 3.87–5.11)
RDW: 12.9 % (ref 11.5–15.5)
WBC: 7.1 10*3/uL (ref 4.0–10.5)
nRBC: 0 % (ref 0.0–0.2)

## 2022-02-02 LAB — URINE CULTURE: Culture: >=100000 — AB

## 2022-02-02 MED ORDER — POLYETHYLENE GLYCOL 3350 17 G PO PACK: 17.0000 g | PACK | Freq: Every day | ORAL | 0 refills | Status: DC

## 2022-02-02 MED ORDER — NITROFURANTOIN MONOHYD MACRO 100 MG PO CAPS: 100.0000 mg | ORAL_CAPSULE | Freq: Two times a day (BID) | ORAL | 0 refills | Status: AC

## 2022-02-02 MED ORDER — MIDODRINE HCL 5 MG PO TABS: 5.0000 mg | ORAL_TABLET | Freq: Two times a day (BID) | ORAL | 0 refills | Status: DC

## 2022-02-02 MED ORDER — NITROFURANTOIN MONOHYD MACRO 100 MG PO CAPS: 100.0000 mg | ORAL_CAPSULE | Freq: Two times a day (BID) | ORAL | Status: DC

## 2022-02-02 NOTE — Care Management Important Message (Signed)
Important Message  Patient Details  Name: [name] MRN: [id number] Date of Birth: 07/24/1940   Medicare Important Message Given:  Yes     Tommy Medal 02/02/2022, 9:51 AM

## 2022-02-02 NOTE — Discharge Instructions (Signed)
Pressure off loading to prevent sacral pressure ulcer   Right eyebrow staple to be removed on 10/30

## 2022-02-02 NOTE — NC FL2 (Signed)
[place] LEVEL OF CARE SCREENING TOOL     IDENTIFICATION  Patient Name: [name] Birthdate: 03-18-1941 Sex: female Admission Date (Current Location): 01/29/2022  [place] and [place] Number:  [place] [facility] [id number] [place] and Address:  [place] [address] [place]      Provider Number: [id number]  Attending Physician Name and Address:  Florencia Reasons, MD  Relative Name and Phone Number:  chambers, Nira Conn (Niece)   [phone] (Mobile    Current Level of Care: Hospital Recommended Level of Care: [place] Prior Approval Number:    Date Approved/Denied:   PASRR Number:    Discharge Plan: Domiciliary (Rest home)    Current Diagnoses: Patient Active Problem List   Diagnosis Date Noted   SDH (subdural hematoma) ([place]) 01/30/2022   Subdural hematoma ([place]) 01/29/2022   Fall at home, initial encounter 01/29/2022   Leukocytosis 01/29/2022   SIADH (syndrome of inappropriate ADH production) ([place]) 17/51/0258   Systolic heart failure ([place]) 01/16/2022   CKD (chronic kidney disease), stage II 01/16/2022   Hyponatremia 01/15/2022   Pressure injury of skin 01/15/2022   hyperthyroidism  52/77/8242   Acute metabolic encephalopathy - RESOLVED 10/24/2021   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   Elevated troponin 10/24/2021   Pain due to onychomycosis of toenails of both feet 09/12/2020   Chronic low back pain without sciatica 10/02/2017   Essential hypertension 10/02/2017   Paranoid schizophrenia ([place]) 09/19/2017   Delusional disorder, 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[place]) 09/18/2017   Behavioral change 09/18/2017   Severe manic bipolar 1 disorder with psychotic behavior ([place]) 08/01/2017   History of left inguinal hernia repair 08/06/2016   H/O total knee replacement, left 06/19/2016   Primary osteoarthritis of left knee 04/04/2016   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/2016   Anxiety 11/15/2015   Gastroesophageal reflux disease without esophagitis 11/15/2015    Solitary right kidney 07/07/2015   Urge incontinence of urine 07/07/2015   Asthma 12/07/2011   Arthritis 12/07/2011   Thoracic spondylosis - mild to moderate 2009 12/07/2011   Obesity  12/07/2011   Incisional hernia 03/29/2011   DIVERTICULOSIS-COLON 12/01/2008   ABDOMINAL PAIN-RLQ 12/01/2008    Orientation RESPIRATION BLADDER Height & Weight     Self, Time, Situation, Place  Normal Incontinent Weight:   Height:     BEHAVIORAL SYMPTOMS/MOOD NEUROLOGICAL BOWEL NUTRITION STATUS      Incontinent Diet (heart healthy)  AMBULATORY STATUS COMMUNICATION OF NEEDS Skin   Limited Assist Verbally PU Stage and Appropriate Care (sacrum)                       Personal Care Assistance Level of Assistance  Bathing, Feeding, Dressing Bathing Assistance: Limited assistance Feeding assistance: Limited assistance Dressing Assistance: Limited assistance     Functional Limitations Info  Sight, Hearing, Speech Sight Info: Adequate Hearing Info: Adequate Speech Info: Adequate    SPECIAL CARE FACTORS FREQUENCY                       Contractures Contractures Info: Not present    Additional Factors Info  Code Status, Allergies Code Status Info: Full Code Allergies Info: haloperidol, aripiprazole, chlorpheniramine-pse-ibuprofen           Current Medications (02/02/2022):  This is the current hospital active medication list Current Facility-Administered Medications  Medication Dose Route Frequency Provider Last Rate Last Admin   acetaminophen (TYLENOL) tablet 650 mg  650 mg Oral Q6H PRN Zierle-Ghosh, Asia B, DO  Or   acetaminophen (TYLENOL) suppository 650 mg  650 mg Rectal Q6H PRN Zierle-Ghosh, Asia B, DO       albuterol (PROVENTIL) (2.5 MG/3ML) 0.083% nebulizer solution 2.5 mg  2.5 mg Nebulization Q2H PRN Zierle-Ghosh, Asia B, DO       amantadine (SYMMETREL) capsule 100 mg  100 mg Oral BID Zierle-Ghosh, Asia B, DO   100 mg at 02/02/22 0828   fluticasone  furoate-vilanterol (BREO ELLIPTA) 200-25 MCG/ACT 1 puff  1 puff Inhalation Daily Zierle-Ghosh, Asia B, DO   1 puff at 02/02/22 0720   gabapentin (NEURONTIN) capsule 100 mg  100 mg Oral QHS Zierle-Ghosh, Asia B, DO   100 mg at 02/01/22 2109   melatonin tablet 3 mg  3 mg Oral QHS Zierle-Ghosh, Asia B, DO   3 mg at 02/01/22 2109   menthol-cetylpyridinium (CEPACOL) lozenge 3 mg  1 lozenge Oral PRN Zierle-Ghosh, Asia B, DO       midodrine (PROAMATINE) tablet 5 mg  5 mg Oral BID WC Zierle-Ghosh, Asia B, DO   5 mg at 02/02/22 9528   mirtazapine (REMERON) tablet 7.5 mg  7.5 mg Oral QHS Zierle-Ghosh, Asia B, DO   7.5 mg at 02/01/22 2111   morphine (PF) 2 MG/ML injection 2 mg  2 mg Intravenous Q2H PRN Zierle-Ghosh, Asia B, DO       naphazoline-glycerin (CLEAR EYES REDNESS) ophth solution 1-2 drop  1-2 drop Both Eyes QID PRN Florencia Reasons, MD       nitrofurantoin (macrocrystal-monohydrate) (MACROBID) capsule 100 mg  100 mg Oral Q12H Florencia Reasons, MD       ondansetron [facility]) tablet 4 mg  4 mg Oral Q6H PRN Zierle-Ghosh, Asia B, DO       Or   ondansetron (ZOFRAN) injection 4 mg  4 mg Intravenous Q6H PRN Zierle-Ghosh, Asia B, DO       oxyCODONE (Oxy IR/ROXICODONE) immediate release tablet 5 mg  5 mg Oral Q4H PRN Zierle-Ghosh, Asia B, DO   5 mg at 02/02/22 0636   pantoprazole (PROTONIX) EC tablet 40 mg  40 mg Oral Daily Zierle-Ghosh, Asia B, DO   40 mg at 02/02/22 0828   polyethylene glycol (MIRALAX / GLYCOLAX) packet 17 g  17 g Oral Daily Florencia Reasons, MD   17 g at 02/02/22 4132   risperiDONE (RISPERDAL) tablet 1 mg  1 mg Oral QHS Zierle-Ghosh, Asia B, DO   1 mg at 02/01/22 2110     Discharge Medications: TAKE these medications     acetaminophen 325 MG tablet Commonly known as: TYLENOL Take 650 mg by mouth every 6 (six) hours as needed.    amantadine 100 MG capsule Commonly known as: SYMMETREL Take 100 mg by mouth 2 (two) times daily.    antiseptic oral rinse Liqd 15 mLs by Mouth Rinse route as needed for dry  mouth.    Breo Ellipta 200-25 MCG/ACT Aepb Generic drug: fluticasone furoate-vilanterol Inhale 1 puff into the lungs daily.    cetirizine 10 MG tablet Commonly known as: ZYRTEC Take 10 mg by mouth daily.    Combivent Respimat 20-100 MCG/ACT Aers respimat Generic drug: Ipratropium-Albuterol Inhale 1 puff into the lungs every 6 (six) hours as needed for shortness of breath or wheezing.    fluticasone 50 MCG/ACT nasal spray Commonly known as: FLONASE Place 2 sprays into both nostrils daily.    ibuprofen 400 MG tablet Commonly known as: ADVIL Take 400 mg by mouth every 8 (eight) hours as needed for cramping.  melatonin 3 MG Tabs tablet Take 3 mg by mouth at bedtime.    midodrine 5 MG tablet Commonly known as: PROAMATINE Take 1 tablet (5 mg total) by mouth 2 (two) times daily with a meal. Her systolic blood pressure in the hospital is around 110 to 130 while taking midrodrine '5mg'$  bid, she may not need midodrine long term, pcp to continue to monitor What changed: additional instructions    mirtazapine 7.5 MG tablet Commonly known as: REMERON Take 1 tablet (7.5 mg total) by mouth at bedtime.    nitrofurantoin (macrocrystal-monohydrate) 100 MG capsule Commonly known as: MACROBID Take 1 capsule (100 mg total) by mouth every 12 (twelve) hours for 3 days.    omeprazole 20 MG capsule Commonly known as: PRILOSEC Take 1 capsule (20 mg total) by mouth daily.    polyethylene glycol 17 g packet Commonly known as: MIRALAX / GLYCOLAX Take 17 g by mouth daily. Start taking on: February 03, 2022    PreserVision AREDS Tabs Take 1 tablet by mouth in the morning and at bedtime.    risperiDONE 1 MG tablet Commonly known as: RISPERDAL Take 1 tablet (1 mg total) by mouth at bedtime.    valbenazine 40 MG capsule Commonly known as: Ingrezza Take 1 capsule (40 mg total) by mouth daily.    Relevant Imaging Results:  Relevant Lab Results:   Additional Information    Boleslaus Holloway,  Clydene Pugh, LCSW

## 2022-02-02 NOTE — Plan of Care (Signed)

## 2022-02-02 NOTE — Progress Notes (Signed)
Pt slept through the night, vitals stable. Pain reported in right side of face and neck. PRN oxycodone given.

## 2022-02-02 NOTE — TOC Transition Note (Signed)
Transition of Care [facility]) - CM/SW Discharge Note   Patient Details  Name: [name] MRN: [id number] Date of Birth: 12-Jan-1941  [facility]) CM/SW Contact:  Ihor Gully, LCSW Phone Number: 02/02/2022, 1:39 PM   Clinical Narrative:    D/c clinicals sent to facility. Message left for niece regarding d/c. [facility] staff to provide transport. TOC signing off.    Final next level of care: Assisted Living Barriers to Discharge: No Barriers Identified   Patient Goals and CMS Choice Patient states their goals for this hospitalization and ongoing recovery are:: return to ALF CMS Medicare.gov Compare Post Acute Care list provided to:: Patient Choice offered to / list presented to : Patient  Discharge Placement                       Discharge Plan and Services In-house Referral: Clinical Social Work Discharge Planning Services: CM Consult Post Acute Care Choice: Resumption of Svcs/PTA Provider                               Social Determinants of Health (SDOH) Interventions     Readmission Risk Interventions     No data to display

## 2022-02-02 NOTE — Discharge Summary (Addendum)
Discharge Summary  [name] XIP:[id number] DOB: [birth year]  PCP: Lubertha Sayres, FNP  Admit date: 01/29/2022 Discharge date: 02/02/2022  30 Day Unplanned Readmission Risk Score    Flowsheet Row ED to Hosp-Admission (Current) from 01/29/2022 in [facility]  30 Day Unplanned Readmission Risk Score (%) 16.59 Filed at 02/02/2022 0801       This score is the patient's risk of an unplanned readmission within 30 days of being discharged (0 -100%). The score is based on dignosis, age, lab data, medications, orders, and past utilization.   Low:  0-14.9   Medium: 15-21.9   High: 22-29.9   Extreme: 30 and above         Time spent: 25mns, more than 50% time spent on coordination of care, niece updated over the phone .  Recommendations for Outpatient Follow-up:  F/u with PCP within a week  for hospital discharge follow up, repeat cbc/bmp at follow up F/u with cardiology for heart failure management  F/U with neurosurgery Dr OZada Findersfor subdural hematoma     Discharge Diagnoses:  Active Hospital Problems   Diagnosis Date Noted   Subdural hematoma ([place] 01/29/2022   SDH (subdural hematoma) ([place] 01/30/2022   Fall at home, initial encounter 01/29/2022   Leukocytosis 01/29/2022   Paranoid schizophrenia ([place] 09/19/2017   Gastroesophageal reflux disease without esophagitis 11/15/2015    Resolved Hospital Problems  No resolved problems to display.    Discharge Condition: stable  Diet recommendation: heart healthy, 1.2liter fluids restriction   There were no vitals filed for this visit.  History of present illness: ( per admitting MD Dr ZClearence Ped JJack Quartois a 81y.o. female with medical history significant of anxiety, CKD, schizophrenia and bipolar disorder, GERD, and more presents the ED with a chief complaint of fall.  Patient reports that she was in her normal state of health.  She ambulates with a walker, and she had gotten up to go  to the bathroom.  When she got to the bathroom she turned around to turn on the light, felt lightheaded and then was on the ground.  Patient reports she did not lose consciousness.  She did hit her head over her right eyebrow.  She is not on blood thinners.  Patient denies chest pain and loss of consciousness.  She did admit to palpitations after the fall, while laying on the floor.  Patient reports no change in vision, no change in hearing.  She has not had any asymmetric weakness.  She denies difficulty swallowing or speaking.  Patient did hurt her right wrist that she tried to catch herself.  She reports she has sharp pain in her right wrist.  X-ray did not show acute fracture.  On review of systems patient does complain of dryness of her mouth especially at night.  This is likely from medications.  It has been ongoing.  Patient has no other complaints at this time.   Patient does not smoke, does not drink, does not use illicit drugs.  She is vaccinated for COVID.  Patient is full code.  Hospital Course:  Principal Problem:   Subdural hematoma (HCC) Active Problems:   Gastroesophageal reflux disease without esophagitis   Paranoid schizophrenia ([facility]   Fall at home, initial encounter   Leukocytosis   SDH (subdural hematoma) (HCC)   Assessment and Plan:   Subdural hematoma ([place] left tentorial subdural hematoma  No mass effect no edema. No focal deficit at the time of my  evaluation. Discussed with neurosurgery Dr. Venetia Constable, recommend no further work-up necessary and recommend outpatient follow-up. Also recommended the patient actually stay at [facility].  No need for transfer to [facility]. Continue serial neurochecks. Seen by PT who recommended return to ALF with [facility]      Facial /Right eyebrow laceration  S/p repair with staples on 10/23 Will require removal in 7 to 10 days.   Right flank pain X ray no fracture, ct renal stone  no acute findings, likely  musculoskeletal pain, improving     UTI: Leukocytosis  turbid urine with large leuk and + nitrite, many bacteria Urine culture  MDR Ecoli sensitive to nitrofurantoin, cefepime, imipenem, zosyn, resistance to the rest abx tested, details please see original report She is prescribed nitrofurantoin x 3 days, f/u with pcp   Fall at home ( from ALF), initial encounter Patient tells me that she was in the bathroom and suddenly like a whirlwind she fell down. Denies any focal deficit after the fall. Denies any prior similar episode. Seen by PT who recommended home health    Chronic systolic chf Echocardiogram showed reduced ef at 35% Compensated  Appear to have cardiac MRI scheduled in 02/2022 Reports her cardiologist is Dr Radford Pax   H/o SIADH, treated with hypertonic saline at [facility] in  early October 2023 On fluids restriction 1.2liter  Monitor sodium level   Chronic hypotension Her systolic blood pressure in the hospital is around 110 to 130 while taking midrodrine 83m bid, she may not need midodrine long term, pcp to continue to monitor    Paranoid schizophrenia ([place] Continue Remeron, Risperdal, Ingrezza, Symmetrel   Gastroesophageal reflux disease without esophagitis Continue Protonix    Discharge Exam: BP 115/63 (BP Location: Right Wrist)   Pulse 72   Temp 98.3 F (36.8 C) (Oral)   Resp 16   SpO2 95%   General: NAD, AAox3 Cardiovascular: RRR Respiratory: normal respiratory effort    Discharge Instructions     Diet - low sodium heart healthy   Complete by: As directed    Discharge wound care:   Complete by: As directed    Pressure off loading to prevent sacral pressure ulcer  Right eyebrow staple to be removed on 10/30   Increase activity slowly   Complete by: As directed       Allergies as of 02/02/2022       Reactions   Haloperidol Anaphylaxis   Aripiprazole Other (See Comments)   Bad thoughts    Chlorpheniramine-pse-ibuprofen    Other  reaction(s): Cough        Medication List     STOP taking these medications    loperamide 2 MG capsule Commonly known as: IMODIUM       TAKE these medications    acetaminophen 325 MG tablet Commonly known as: TYLENOL Take 650 mg by mouth every 6 (six) hours as needed.   amantadine 100 MG capsule Commonly known as: SYMMETREL Take 100 mg by mouth 2 (two) times daily.   antiseptic oral rinse Liqd 15 mLs by Mouth Rinse route as needed for dry mouth.   Breo Ellipta 200-25 MCG/ACT Aepb Generic drug: fluticasone furoate-vilanterol Inhale 1 puff into the lungs daily.   cetirizine 10 MG tablet Commonly known as: ZYRTEC Take 10 mg by mouth daily.   Combivent Respimat 20-100 MCG/ACT Aers respimat Generic drug: Ipratropium-Albuterol Inhale 1 puff into the lungs every 6 (six) hours as needed for shortness of breath or wheezing.   fluticasone  50 MCG/ACT nasal spray Commonly known as: FLONASE Place 2 sprays into both nostrils daily.   ibuprofen 400 MG tablet Commonly known as: ADVIL Take 400 mg by mouth every 8 (eight) hours as needed for cramping.   melatonin 3 MG Tabs tablet Take 3 mg by mouth at bedtime.   midodrine 5 MG tablet Commonly known as: PROAMATINE Take 1 tablet (5 mg total) by mouth 2 (two) times daily with a meal. Her systolic blood pressure in the hospital is around 110 to 130 while taking midrodrine 64m bid, she may not need midodrine long term, pcp to continue to monitor What changed: additional instructions   mirtazapine 7.5 MG tablet Commonly known as: REMERON Take 1 tablet (7.5 mg total) by mouth at bedtime.   nitrofurantoin (macrocrystal-monohydrate) 100 MG capsule Commonly known as: MACROBID Take 1 capsule (100 mg total) by mouth every 12 (twelve) hours for 3 days.   omeprazole 20 MG capsule Commonly known as: PRILOSEC Take 1 capsule (20 mg total) by mouth daily.   polyethylene glycol 17 g packet Commonly known as: MIRALAX / GLYCOLAX Take  17 g by mouth daily. Start taking on: February 03, 2022   PreserVision AREDS Tabs Take 1 tablet by mouth in the morning and at bedtime.   risperiDONE 1 MG tablet Commonly known as: RISPERDAL Take 1 tablet (1 mg total) by mouth at bedtime.   valbenazine 40 MG capsule Commonly known as: Ingrezza Take 1 capsule (40 mg total) by mouth daily.               Discharge Care Instructions  (From admission, onward)           Start     Ordered   02/02/22 0000  Discharge wound care:       Comments: Pressure off loading to prevent sacral pressure ulcer  Right eyebrow staple to be removed on 10/30   02/02/22 1144           Allergies  Allergen Reactions   Haloperidol Anaphylaxis   Aripiprazole Other (See Comments)    Bad thoughts    Chlorpheniramine-Pse-Ibuprofen     Other reaction(s): Cough    Follow-up Information     Mallipeddi, Vishnu P, MD Follow up in 1 month(s).   Specialties: Cardiology, Internal Medicine Why: for heart failure management Contact information: [phone]         SLubertha Sayres FNP Follow up.   Specialty: Family Medicine Why: staple need to be removed 7-10 days after placement ( placed on 10/23) Contact information: [address] [place] [phone]         OJudith Part MD Follow up.   Specialty: Neurosurgery Why: subdural hematoma Contact information: [place] [phone]                 The results of significant diagnostics from this hospitalization (including imaging, microbiology, ancillary and laboratory) are listed below for reference.    Significant Diagnostic Studies: CT RENAL STONE STUDY  Result Date: 02/01/2022 CLINICAL DATA:  Right flank pain. Kidney stones suspected. Right flank pain for 1 week. Recent fall. EXAM: CT ABDOMEN AND PELVIS WITHOUT CONTRAST TECHNIQUE: Multidetector CT imaging of the abdomen and pelvis was  performed following the standard protocol without IV contrast. RADIATION DOSE REDUCTION: This exam was performed according to the departmental dose-optimization program which includes automated exposure control, adjustment of the mA and/or kV according to patient  size and/or use of iterative reconstruction technique. COMPARISON:  05/08/2020 FINDINGS: Lower chest: Small bilateral pleural effusions with basilar atelectasis or consolidation, greater on the left. Mild interstitial changes in the lung bases could be due to fibrosis or edema. Cardiac enlargement. Hepatobiliary: Multiple simple appearing liver cysts, largest measuring 10.6 cm diameter. No change since prior study. Likely benign etiology. No imaging follow-up is indicated. Surgical absence of the gallbladder. No bile duct dilatation. Pancreas: Unremarkable. No pancreatic ductal dilatation or surrounding inflammatory changes. Spleen: Normal in size without focal abnormality. Adrenals/Urinary Tract: No adrenal gland nodules. Surgical absence of the left kidney. Right kidney and right ureter unremarkable. Small left anterior bladder diverticulum. No bladder wall thickening or filling defect. Stomach/Bowel: Stomach, small bowel, and colon are not abnormally distended. Scattered gas and stool in the colon. No wall thickening or inflammatory changes. Appendix is not identified. Vascular/Lymphatic: Aortic atherosclerosis. No enlarged abdominal or pelvic lymph nodes. Reproductive: Uterus and ovaries are not enlarged. Gas and fluid are demonstrated in the vagina. Nonspecific etiology, possibly infection, obstruction, or hemorrhage. Correlate with physical examination. Other: No free air or free fluid in the abdomen. Laxity of the abdominal wall musculature without discrete herniation. Scarring along the midline is consistent with postoperative change. Musculoskeletal: Degenerative changes in the spine and hips. No acute bony abnormalities. IMPRESSION: 1. Small  bilateral pleural effusions with basilar atelectasis or consolidation, greater on the left. Changes could represent pneumonia in the appropriate clinical setting. 2. Mild interstitial changes in the lung bases could indicate fibrosis or edema. 3. Cardiac enlargement. 4. Multiple benign-appearing liver cysts are unchanged. No imaging follow-up is indicated. 5. Surgical absence of the left kidney. No right renal or ureteral stone or obstruction. 6. Small bladder diverticulum.  No bladder wall thickening. 7. Aortic atherosclerosis. 8. Gas and fluid demonstrated in the vagina. Nonspecific etiology but could indicate infection, obstruction, or hemorrhage. Correlate with physical examination. Electronically Signed   By: Lucienne Capers M.D.   On: 02/01/2022 20:09   DG Abd 1 View  Result Date: 01/31/2022 CLINICAL DATA:  Right-sided back pain near kidney for 1 week EXAM: ABDOMEN - 1 VIEW COMPARISON:  CT abdomen pelvis, 05/08/2020 FINDINGS: Nonobstructive pattern of bowel gas. No large burden of stool. Multiple rounded densities project in the right upper quadrant adjacent to the colon; suspect that this reflects inspissated barium within colonic diverticula and not renal stones, particularly given absence of renal calculi on prior examination dated 05/08/2020. No obvious free air on single supine radiograph. IMPRESSION: 1. Nonobstructive pattern of bowel gas. No large burden of stool. 2. Multiple rounded densities project in the right upper quadrant adjacent to the colon; suspect that this reflects inspissated barium within colonic diverticula and not renal stones, particularly given absence of renal calculi on prior examination dated 05/08/2020. CT may be used to further evaluate for urinary tract calculi and other etiologies of pain if desired. Electronically Signed   By: Delanna Ahmadi M.D.   On: 01/31/2022 13:10   DG CHEST PORT 1 VIEW  Result Date: 01/31/2022 CLINICAL DATA:  Right-sided back pain for 1 week  EXAM: PORTABLE CHEST 1 VIEW COMPARISON:  01/20/2022 FINDINGS: Cardiomegaly. Unchanged small left pleural effusion with associated atelectasis or consolidation. The visualized skeletal structures are unremarkable. IMPRESSION: Cardiomegaly. Unchanged small left pleural effusion with associated atelectasis or consolidation. No new airspace opacity. Electronically Signed   By: Delanna Ahmadi M.D.   On: 01/31/2022 13:07   ECHOCARDIOGRAM COMPLETE  Result Date: 01/30/2022    ECHOCARDIOGRAM REPORT  Patient Name:   VERNER KOPISCHKE Date of Exam: 01/30/2022 Medical Rec #:  [id number]        Height:       62.0 in Accession #:    [id number]       Weight:       178.4 lb Date of Birth:  1940/09/03       BSA:          1.821 m Patient Age:    81 years         BP:           111/57 mmHg Patient Gender: F                HR:           77 bpm. Exam Location:  [facility] Procedure: 2D Echo, Cardiac Doppler and Color Doppler Indications:    Syncope R55  History:        Patient has no prior history of Echocardiogram examinations.                 Risk Factors:Hypertension. CKD (chronic kidney disease), stage                 II, Systolic heart failure ([employer]).  Sonographer:    Alvino Chapel RCS Referring Phys: [id number] ASIA B Funk  1. Left ventricular ejection fraction, by estimation, is 30 to 35%. The left ventricle has moderately decreased function. The left ventricle demonstrates global hypokinesis. Left ventricular diastolic parameters are consistent with Grade I diastolic dysfunction (impaired relaxation). Elevated left atrial pressure.  2. Right ventricular systolic function is normal. The right ventricular size is normal.  3. Left atrial size was moderately dilated.  4. Moderate pleural effusion in the left lateral region.  5. The mitral valve is abnormal. Mild to moderate mitral valve regurgitation. No evidence of mitral stenosis.  6. The aortic valve is tricuspid. Aortic valve regurgitation is not visualized.  No aortic stenosis is present.  7. The inferior vena cava is normal in size with greater than 50% respiratory variability, suggesting right atrial pressure of 3 mmHg. FINDINGS  Left Ventricle: Left ventricular ejection fraction, by estimation, is 30 to 35%. The left ventricle has moderately decreased function. The left ventricle demonstrates global hypokinesis. The left ventricular internal cavity size was normal in size. There is no left ventricular hypertrophy. Left ventricular diastolic parameters are consistent with Grade I diastolic dysfunction (impaired relaxation). Elevated left atrial pressure. Right Ventricle: The right ventricular size is normal. No increase in right ventricular wall thickness. Right ventricular systolic function is normal. Left Atrium: Left atrial size was moderately dilated. Right Atrium: Right atrial size was normal in size. Pericardium: There is no evidence of pericardial effusion. Mitral Valve: The mitral valve is abnormal. Mild to moderate mitral valve regurgitation. No evidence of mitral valve stenosis. Tricuspid Valve: The tricuspid valve is normal in structure. Tricuspid valve regurgitation is not demonstrated. No evidence of tricuspid stenosis. Aortic Valve: The aortic valve is tricuspid. Aortic valve regurgitation is not visualized. No aortic stenosis is present. Aortic valve mean gradient measures 4.0 mmHg. Aortic valve peak gradient measures 7.2 mmHg. Aortic valve area, by VTI measures 2.76 cm. Pulmonic Valve: The pulmonic valve was not well visualized. Pulmonic valve regurgitation is not visualized. No evidence of pulmonic stenosis. Aorta: The aortic root is normal in size and structure. Venous: The inferior vena cava is normal in size with greater than 50% respiratory variability, suggesting right atrial pressure of 3  mmHg. IAS/Shunts: No atrial level shunt detected by color flow Doppler. Additional Comments: There is a moderate pleural effusion in the left lateral region.   LEFT VENTRICLE PLAX 2D LVIDd:         6.30 cm      Diastology LVIDs:         5.20 cm      LV e' medial:    4.13 cm/s LV PW:         1.10 cm      LV E/e' medial:  22.1 LV IVS:        1.00 cm      LV e' lateral:   6.31 cm/s LVOT diam:     2.20 cm      LV E/e' lateral: 14.5 LV SV:         70 LV SV Index:   39 LVOT Area:     3.80 cm  LV Volumes (MOD) LV vol d, MOD A2C: 131.0 ml LV vol d, MOD A4C: 141.0 ml LV vol s, MOD A2C: 90.3 ml LV vol s, MOD A4C: 96.3 ml LV SV MOD A2C:     40.7 ml LV SV MOD A4C:     141.0 ml LV SV MOD BP:      45.0 ml RIGHT VENTRICLE RV S prime:     14.70 cm/s TAPSE (M-mode): 1.8 cm LEFT ATRIUM           Index        RIGHT ATRIUM           Index LA diam:      4.80 cm 2.64 cm/m   RA Area:     16.00 cm LA Vol (A4C): 86.7 ml 47.61 ml/m  RA Volume:   41.10 ml  22.57 ml/m  AORTIC VALVE AV Area (Vmax):    2.92 cm AV Area (Vmean):   2.78 cm AV Area (VTI):     2.76 cm AV Vmax:           134.16 cm/s AV Vmean:          92.753 cm/s AV VTI:            0.255 m AV Peak Grad:      7.2 mmHg AV Mean Grad:      4.0 mmHg LVOT Vmax:         103.00 cm/s LVOT Vmean:        67.900 cm/s LVOT VTI:          0.185 m LVOT/AV VTI ratio: 0.73  AORTA Ao Root diam: 3.30 cm MITRAL VALVE MV Area (PHT): 5.97 cm    SHUNTS MV Decel Time: 127 msec    Systemic VTI:  0.18 m MR Peak grad: 85.7 mmHg    Systemic Diam: 2.20 cm MR Mean grad: 50.0 mmHg MR Vmax:      463.00 cm/s MR Vmean:     321.0 cm/s MV E velocity: 91.30 cm/s MV A velocity: 99.80 cm/s MV E/A ratio:  0.91 Carlyle Dolly MD Electronically signed by Carlyle Dolly MD Signature Date/Time: 01/30/2022/1:33:45 PM    Final    CT HEAD WO CONTRAST (5MM)  Result Date: 01/30/2022 CLINICAL DATA:  81 year old female status post fall with left tentorial subdural hematoma. EXAM: CT HEAD WITHOUT CONTRAST TECHNIQUE: Contiguous axial images were obtained from the base of the skull through the vertex without intravenous contrast. RADIATION DOSE REDUCTION: This exam was performed  according to the departmental dose-optimization program which includes automated exposure control,  adjustment of the mA and/or kV according to patient size and/or use of iterative reconstruction technique. COMPARISON:  01/29/2022 head CT and earlier. FINDINGS: Brain: Left tentorial hyperdense subdural blood is stable, up to 7 mm in thickness but mostly 2 mm or less (coronal image 51). Trace if any left lateral hemispheric component. But there does appear to be mild mass effect on the left lateral ventricle, and there is trace rightward midline shift, stable. Normal basilar cisterns. Stable gray-white matter differentiation throughout the brain. No cortically based acute infarct identified. No IVH or ventriculomegaly. No new intracranial hemorrhage identified. Vascular: Mild Calcified atherosclerosis at the skull base. Skull: Stable.  No fracture identified. Sinuses/Orbits: Visualized paranasal sinuses and mastoids are stable and well aerated. Other: Skin staples along the right lateral orbit again noted. No new orbit or scalp soft tissue injury identified. IMPRESSION: 1. Stable small left tentorial subdural hematoma. No significant intracranial mass effect. 2. No skull fracture or new intracranial abnormality identified. Electronically Signed   By: Genevie Ann M.D.   On: 01/30/2022 08:37   CT Head Wo Contrast  Result Date: 01/29/2022 CLINICAL DATA:  Fall from standing with laceration above right eyebrow EXAM: CT HEAD WITHOUT CONTRAST CT CERVICAL SPINE WITHOUT CONTRAST TECHNIQUE: Multidetector CT imaging of the head and cervical spine was performed following the standard protocol without intravenous contrast. Multiplanar CT image reconstructions of the cervical spine were also generated. RADIATION DOSE REDUCTION: This exam was performed according to the departmental dose-optimization program which includes automated exposure control, adjustment of the mA and/or kV according to patient size and/or use of iterative  reconstruction technique. COMPARISON:  CT head and cervical spine 10/24/2021 FINDINGS: CT HEAD FINDINGS Brain: There is acute subdural blood along the left tentorial leaflet measuring up to 6 mm in thickness without mass effect on the underlying parenchyma or parenchymal edema. There is trace subdural blood measuring 1-2 mm in thickness overlying the left cerebral hemisphere laterally (4-34). There is no other acute intracranial hemorrhage or extra-axial fluid collection. There is no acute territorial infarct. Background parenchymal volume is normal for age. The ventricles are normal in size. Patchy and confluent hypodensity in the supratentorial white matter likely reflects sequela of moderate chronic small vessel ischemic change, stable. There is no mass lesion.  There is no midline shift. Vascular: No hyperdense vessel or unexpected calcification. Skull: Normal. Negative for fracture or focal lesion. Sinuses/Orbits: The paranasal sinuses are clear. Bilateral lens implants are in place. The globes and orbits are otherwise unremarkable. Other: Surgical staples are noted along the right lateral orbital rim. CT CERVICAL SPINE FINDINGS Alignment: Normal. There is no jumped or perched facet or other evidence of traumatic malalignment. Skull base and vertebrae: Skull base alignment is maintained. Vertebral body heights are preserved. There is no evidence of acute fracture. There is no suspicious osseous lesion. Soft tissues and spinal canal: No prevertebral fluid or swelling. No visible canal hematoma. Disc levels: There is disc space narrowing degenerative endplate change most advanced at C5-C6 and C6-C7, stable. There is no evidence of high-grade spinal canal stenosis. Upper chest: There is interlobular septal thickening in the lung apices with partially imaged opacity in the right lung apex. Other: Right thyroid lobe is enlarged and heterogeneous, unchanged since 2018. No specific imaging follow-up is required.  IMPRESSION: 1. Acute subdural hematoma primarily layering along the left tentorial leaflet measuring up to 6 mm without mass effect or parenchymal edema. 2. No acute fracture or traumatic malalignment of the cervical spine. 3. Interlobular  septal thickening in the lung apices may reflect pulmonary edema. Incompletely imaged opacity in the right lung apex. Consider dedicated imaging of the chest. Critical Value/emergent results were called by telephone at the time of interpretation on 01/29/2022 at 5:06 pm to provider [facility] ZAMMIT , who verbally acknowledged these results. Electronically Signed   By: Valetta Mole M.D.   On: 01/29/2022 17:08   CT Cervical Spine Wo Contrast  Result Date: 01/29/2022 CLINICAL DATA:  Fall from standing with laceration above right eyebrow EXAM: CT HEAD WITHOUT CONTRAST CT CERVICAL SPINE WITHOUT CONTRAST TECHNIQUE: Multidetector CT imaging of the head and cervical spine was performed following the standard protocol without intravenous contrast. Multiplanar CT image reconstructions of the cervical spine were also generated. RADIATION DOSE REDUCTION: This exam was performed according to the departmental dose-optimization program which includes automated exposure control, adjustment of the mA and/or kV according to patient size and/or use of iterative reconstruction technique. COMPARISON:  CT head and cervical spine 10/24/2021 FINDINGS: CT HEAD FINDINGS Brain: There is acute subdural blood along the left tentorial leaflet measuring up to 6 mm in thickness without mass effect on the underlying parenchyma or parenchymal edema. There is trace subdural blood measuring 1-2 mm in thickness overlying the left cerebral hemisphere laterally (4-34). There is no other acute intracranial hemorrhage or extra-axial fluid collection. There is no acute territorial infarct. Background parenchymal volume is normal for age. The ventricles are normal in size. Patchy and confluent hypodensity in the  supratentorial white matter likely reflects sequela of moderate chronic small vessel ischemic change, stable. There is no mass lesion.  There is no midline shift. Vascular: No hyperdense vessel or unexpected calcification. Skull: Normal. Negative for fracture or focal lesion. Sinuses/Orbits: The paranasal sinuses are clear. Bilateral lens implants are in place. The globes and orbits are otherwise unremarkable. Other: Surgical staples are noted along the right lateral orbital rim. CT CERVICAL SPINE FINDINGS Alignment: Normal. There is no jumped or perched facet or other evidence of traumatic malalignment. Skull base and vertebrae: Skull base alignment is maintained. Vertebral body heights are preserved. There is no evidence of acute fracture. There is no suspicious osseous lesion. Soft tissues and spinal canal: No prevertebral fluid or swelling. No visible canal hematoma. Disc levels: There is disc space narrowing degenerative endplate change most advanced at C5-C6 and C6-C7, stable. There is no evidence of high-grade spinal canal stenosis. Upper chest: There is interlobular septal thickening in the lung apices with partially imaged opacity in the right lung apex. Other: Right thyroid lobe is enlarged and heterogeneous, unchanged since 2018. No specific imaging follow-up is required. IMPRESSION: 1. Acute subdural hematoma primarily layering along the left tentorial leaflet measuring up to 6 mm without mass effect or parenchymal edema. 2. No acute fracture or traumatic malalignment of the cervical spine. 3. Interlobular septal thickening in the lung apices may reflect pulmonary edema. Incompletely imaged opacity in the right lung apex. Consider dedicated imaging of the chest. Critical Value/emergent results were called by telephone at the time of interpretation on 01/29/2022 at 5:06 pm to provider [facility] ZAMMIT , who verbally acknowledged these results. Electronically Signed   By: Valetta Mole M.D.   On: 01/29/2022  17:08   DG Wrist Complete Right  Result Date: 01/29/2022 CLINICAL DATA:  Post fall today. EXAM: RIGHT WRIST - COMPLETE 3+ VIEW COMPARISON:  None available FINDINGS: Degenerative changes throughout the wrist and first carpometacarpal joint. No signs of soft tissue swelling, fracture or of dislocation. IMPRESSION: Degenerative changes  without acute fracture or dislocation. Electronically Signed   By: Zetta Bills M.D.   On: 01/29/2022 15:46   DG Chest Port 1 View  Result Date: 01/20/2022 CLINICAL DATA:  Asthma. EXAM: PORTABLE CHEST 1 VIEW COMPARISON:  01/17/2022 FINDINGS: 0732 hours. Low volume film. The cardio pericardial silhouette is enlarged. Interstitial markings are diffusely coarsened with chronic features. Left base atelectasis with tiny bilateral pleural effusions. Telemetry leads overlie the chest. IMPRESSION: Low volume film with left base atelectasis and tiny bilateral pleural effusions. Electronically Signed   By: Misty Stanley M.D.   On: 01/20/2022 09:43   DG Swallowing Func-Speech Pathology  Result Date: 01/17/2022 Table formatting from the original result was not included. Objective Swallowing Evaluation: Type of Study: MBS-Modified Barium Swallow Study  Patient Details Name: KEISHAWN DARSEY MRN: [id number] Date of Birth: 08/06/1940 Today's Date: 01/17/2022 Time: SLP Start Time (ACUTE ONLY): 1340 -SLP Stop Time (ACUTE ONLY): 1400 SLP Time Calculation (min) (ACUTE ONLY): 20 min Past Medical History: Past Medical History: Diagnosis Date  Anemia 1990  hx of  Anxiety   Arthritis 2004  Asthma 1995  uses inhaler  Chronic kidney disease 2010  LEFT kidney removed  Colon polyp   Complication of anesthesia 1966  first surgery she had- had a hard time waking up from surgery  Delusional disorder ([place])   Six delusional disorder  Diverticulosis 2003  GERD (gastroesophageal reflux disease)   on meds  Schizophrenia [facility])  Past Surgical History: Past Surgical History: Procedure Laterality Date   APPENDECTOMY  1966  CATARACT EXTRACTION    Bilateral  CHOLECYSTECTOMY  2004  COLON SURGERY  2008  COLONOSCOPY  2013  JP-MAC-moviprep(good)-TA  COLOSTOMY    COLOSTOMY TAKEDOWN    DIAGNOSTIC LAPAROSCOPY    exploratory for endometriosis  HEEL SPUR SURGERY    Right, Metal Plate in Heel  [place]  2010  NEPHRECTOMY Left 8341  As complication of partial colectomy for diverticulitis  POLYPECTOMY  2013  TA  TONSILLECTOMY    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  Procedure: LEFT TOTAL KNEE ARTHROPLASTY;  Surgeon: Susa Day, MD;  Location: [facility]S;  Service: Orthopedics;  Laterality: Left;  Adductor Block HPI: Patient is a 81  y.o. female with PMH: HFrEF, GERD, HTN, anxiety, schizophrenia, CKD stage II presented to [facility] from her ALF with AMS.  Found to have severe hyponatremia and pulmonary/peripheral edema.  Transferred to [facility] on 01/15/22 for further management. On 10/10 RN observed patient to have a choking episode while eating lunch meal during which patient became blue in the face, had desaturation of oxygen into 70's with cyanosis. MD changed patient to NPO awaiting SLP to evaluate swallow function.  Subjective: pleasant, alert  Recommendations for follow up therapy are one component of a multi-disciplinary discharge planning process, led by the attending physician.  Recommendations may be updated based on patient status, additional functional criteria and insurance authorization. Assessment / Plan / Recommendation   01/17/2022   5:21 PM Clinical Impressions Clinical Impression Patient presents with a mild pharyngeal phase dysphagia but with oral phase of swallow that appears [facility]. During pharyngeal phase, patient exhibited swallow intiation delays at level of vallecular sinus with puree solids nectar thick liquids and swallow initiation delays to level of pyriform with thin liquids (intermittently with nectar thick liquids). Trace, silent aspiration occured with thin liquids during the swallow when  patient taking large sips and tilting head backwards. When patient kept head in neutral position and took small to average sized sips,  no aspiration observed. Only trace vallecular and pyriform sinus residuals observed with thin liquids however subsequent swallows cleared residuals fully. Barium tablet taken with puree solids and soft solid textures both transited pharyngeally and through cervical esophagus without difficulty. SLP did not observe any swallow impairment during this MBS that would explain her recent choking event with solid texture PO's. Recommendation is for Dys 3 (mechanical soft)  solids and thin liquids. In addition, it is recommended that patient sit upright in bed or preferably, recliner chair during PO intake. SLP Visit Diagnosis Dysphagia, unspecified (R13.10) Impact on safety and function Mild aspiration risk     01/17/2022   5:21 PM Treatment Recommendations Treatment Recommendations Therapy as outlined in treatment plan below     01/17/2022   5:28 PM Prognosis Prognosis for Safe Diet Advancement Good   01/17/2022   5:21 PM Diet Recommendations SLP Diet Recommendations Dysphagia 3 (Mech soft) solids;Thin liquid Liquid Administration via Straw;Cup Medication Administration Whole meds with puree Compensations Slow rate;Small sips/bites;Minimize environmental distractions Postural Changes Seated upright at 90 degrees     01/17/2022   5:21 PM Other Recommendations Oral Care Recommendations Oral care BID Follow Up Recommendations Follow physician's recommendations for discharge plan and follow up therapies Assistance recommended at discharge Frequent or constant Supervision/Assistance Functional Status Assessment Patient has had a recent decline in their functional status and demonstrates the ability to make significant improvements in function in a reasonable and predictable amount of time.   01/17/2022   5:21 PM Frequency and Duration  Speech Therapy Frequency (ACUTE ONLY) min 2x/week Treatment  Duration 1 week     01/17/2022   5:18 PM Oral Phase Oral Phase [facility]    01/17/2022   5:18 PM Pharyngeal Phase Pharyngeal Phase Impaired Pharyngeal- Nectar Cup Delayed swallow initiation-vallecula;Delayed swallow initiation-pyriform sinuses Pharyngeal- Thin Cup Delayed swallow initiation-pyriform sinuses;Reduced airway/laryngeal closure;Penetration/Aspiration during swallow Pharyngeal Material enters airway, passes BELOW cords without attempt by patient to eject out (silent aspiration) Pharyngeal- Thin Straw Delayed swallow initiation-pyriform sinuses;Penetration/Aspiration during swallow;Trace aspiration Pharyngeal Material enters airway, passes BELOW cords without attempt by patient to eject out (silent aspiration) Pharyngeal- Puree Delayed swallow initiation-vallecula Pharyngeal- Mechanical Soft WFL Pharyngeal- Pill [facility]    01/17/2022   5:20 PM Cervical Esophageal Phase  Cervical Esophageal Phase Impaired Cervical Esophageal Comment prominent cricopharyngeal bar observed which did not appear to significantly impede bolus transit Sonia Baller, MA, CCC-SLP Speech Therapy                     DG Chest Port 1 View  Result Date: 01/17/2022 CLINICAL DATA:  89169 CHF (congestive heart failure) ([place]) 45038 EXAM: PORTABLE CHEST 1 VIEW COMPARISON:  January 15, 2022 FINDINGS: The cardiomediastinal silhouette is unchanged and enlarged in contour.RIGHT upper extremity PICC with tip terminating over the superior cavoatrial junction. Favored small bilateral pleural effusions. No pneumothorax. Perihilar vascular fullness, peribronchial cuffing and interstitial prominence, similar in comparison to prior. Visualized abdomen is unremarkable. IMPRESSION: 1.  Support apparatus as described above. 2. Pulmonary edema with small bilateral pleural effusions and scattered atelectasis. Electronically Signed   By: Valentino Saxon M.D.   On: 01/17/2022 07:46   [place] EKG SITE RITE  Result Date: 01/15/2022 If Site Rite image not  attached, placement could not be confirmed due to current cardiac rhythm.  CT Head Wo Contrast  Result Date: 01/15/2022 CLINICAL DATA:  Delirium with possible urinary tract infection. EXAM: CT HEAD WITHOUT CONTRAST TECHNIQUE: Contiguous axial images were obtained from the base of  the skull through the vertex without intravenous contrast. RADIATION DOSE REDUCTION: This exam was performed according to the departmental dose-optimization program which includes automated exposure control, adjustment of the mA and/or kV according to patient size and/or use of iterative reconstruction technique. COMPARISON:  Head CT 10/24/2021 FINDINGS: Brain: There is mild atrophy with extensive chronic small-vessel disease of the cerebral white matter, and several small scattered bilateral gangliocapsular chronic lacunar infarcts. No new asymmetry is seen worrisome for a cortical based acute infarct, hemorrhage or midline shift. No mass or mass effect are identified. There is a mildly expanded empty sella. Ventricles are normal in size and position. Relatively unremarkable cerebellum, brainstem. Vascular: There are scattered calcifications in the siphons. There are no hyperdense central vessels. Skull: Intact with hyperostosis frontalis interna. Sinuses/Orbits: There is mild membrane disease in the sinuses without fluid levels or mastoid effusion. Old lens replacements. Other: None. IMPRESSION: No acute intracranial CT findings or interval changes. Atrophy with extensive chronic small vessel disease and old central lacunae. Expanded empty sella, which may be seen with idiopathic intracranial hypertension. This was seen previously. By report was present as far back as MRI brain 07/15/2000. Carotid atherosclerosis. Electronically Signed   By: Telford Nab M.D.   On: 01/15/2022 01:25   DG Chest Port 1 View  Result Date: 01/15/2022 CLINICAL DATA:  Increased confusion. EXAM: PORTABLE CHEST 1 VIEW COMPARISON:  Chest 10/24/2021.  FINDINGS: The heart is enlarged. There is increased central vascular prominence, flow cephalization and mild interstitial edema in the central and basal lungs with small pleural effusions. There are increased patchy opacities in the lung bases. The lung opacities could be atelectasis, pneumonia or edema. No other focal infiltrate is seen. The mediastinum is normally outlined. There is aortic atherosclerosis. Thoracic spondylosis. IMPRESSION: Mild CHF pattern with central and basilar interstitial edema and small pleural effusions. Opacities in the lung bases which could be atelectasis, pneumonia or airspace edema. Progress chest films recommended depending on clinical response. Electronically Signed   By: Telford Nab M.D.   On: 01/15/2022 00:51   US THYROID  Result Date: 01/08/2022 CLINICAL DATA:  Prior ultrasound follow-up. Multinodular thyroid gland. Patient previously underwent biopsy of right mid/inferior thyroid nodule in March of 2023. EXAM: THYROID ULTRASOUND TECHNIQUE: Ultrasound examination of the thyroid gland and adjacent soft tissues was performed. COMPARISON:  Prior thyroid ultrasound 06/08/2021 FINDINGS: Parenchymal Echotexture: Mildly heterogenous Isthmus: 0.4 cm Right lobe: 5.5 x 3.0 x 3.4 cm Left lobe: 5.2 x 2.3 x 1.6 cm _________________________________________________________ Estimated total number of nodules >/= 1 cm: 3 Number of spongiform nodules >/=  2 cm not described below (TR1): 0 Number of mixed cystic and solid nodules >/= 1.5 cm not described below ([place]): 0 _________________________________________________________ Nodule # 1: Stable solid nodule in the right upper gland at 2 x 1.7 x 1.8 cm compared to 2 x 1.7 x 1.7 cm previously. Nodule # 2: No significant interval change in the size or appearance of the previously biopsied mass in the right mid to lower gland which measures 3.9 x 2.4 x 3.5 cm. Nodule # 3: Cluster of mixed cystic and solid versus spongiform nodules in the left  lower gland. Overall appearance is similar compared to prior imaging and does not warrant biopsy or dedicated imaging surveillance. IMPRESSION: 1. No significant interval change in the size or appearance of the previously biopsied nodule in the right mid gland. 2. Continued stability of solid nodule in the right upper gland. The present exam confirms 6 months of  stability. Recommend follow-up ultrasound in 1 year. The above is in keeping with the ACR TI-RADS recommendations - J Am Coll Radiol 2017;14:587-595. Electronically Signed   By: Jacqulynn Cadet M.D.   On: 01/08/2022 12:56    Microbiology: Recent Results (from the past 240 hour(s))  Urine Culture     Status: Abnormal   Collection Time: 01/31/22  3:09 PM   Specimen: Urine, Clean Catch  Result Value Ref Range Status   Specimen Description   Final    URINE, CLEAN CATCH Performed at [facility], [address] [place], [place] [address]    Special Requests   Final    NONE Performed at [facility], [address] [place], [place] [address]    Culture (A)  Final    >=100,000 COLONIES/mL ESCHERICHIA COLI Confirmed Extended Spectrum Beta-Lactamase Producer (ESBL).  In bloodstream infections from ESBL organisms, carbapenems are preferred over piperacillin/tazobactam. They are shown to have a lower risk of mortality.    Report Status 02/02/2022 FINAL  Final   Organism ID, Bacteria ESCHERICHIA COLI (A)  Final      Susceptibility   Escherichia coli - MIC*    AMPICILLIN >=32 RESISTANT Resistant     CEFAZOLIN >=64 RESISTANT Resistant     CEFEPIME 0.5 SENSITIVE Sensitive     CEFTRIAXONE 32 RESISTANT Resistant     CIPROFLOXACIN >=4 RESISTANT Resistant     GENTAMICIN <=1 SENSITIVE Sensitive     IMIPENEM <=0.25 SENSITIVE Sensitive     NITROFURANTOIN <=16 SENSITIVE Sensitive     TRIMETH/SULFA >=320 RESISTANT Resistant     AMPICILLIN/SULBACTAM >=32 RESISTANT Resistant     PIP/TAZO 16 SENSITIVE Sensitive     * >=100,000 COLONIES/mL  ESCHERICHIA COLI     Labs: Basic Metabolic Panel: Recent Labs  Lab 01/29/22 1745 01/30/22 0244 01/31/22 0405 02/01/22 0349 02/02/22 0307  NA 134* 136 133* 132* 135  K 4.2 4.4 4.2 4.6 4.7  CL 98 101 99 97* 99  CO2 _0 GLUCOSE 96 96 92 81 79  BUN _1 CREATININE 1.08* 1.01* 0.97 1.31* 1.03*  CALCIUM 8.6* 8.6* 8.5* 8.5* 8.6*  MG  --  2.0  --   --   --    Liver Function Tests: Recent Labs  Lab 01/30/22 0244  AST 13*  ALT 16  ALKPHOS 77  BILITOT 0.5  PROT 5.7*  ALBUMIN 3.2*   No results for input(s): "LIPASE", "AMYLASE" in the last 168 hours. No results for input(s): "AMMONIA" in the last 168 hours. CBC: Recent Labs  Lab 01/29/22 1745 01/30/22 0244 01/31/22 0405 02/01/22 0349 02/02/22 0307  WBC 13.3* 10.4 8.7 7.5 7.1  NEUTROABS 10.7* 8.1*  --   --   --   HGB 12.3 11.3* 11.4* 11.7* 11.7*  HCT 38.7 35.5* 35.0* 36.5 35.9*  MCV 94.6 94.7 92.8 94.1 93.0  PLT 301 265 256 226 257   Cardiac Enzymes: No results for input(s): "CKTOTAL", "CKMB", "CKMBINDEX", "TROPONINI" in the last 168 hours. BNP: BNP (last 3 results) Recent Labs    01/21/22 0420 01/22/22 0345 01/23/22 0259  BNP 660.1* 379.7* 229.6*    ProBNP (last 3 results) No results for input(s): "PROBNP" in the last 8760 hours.  CBG: No results for input(s): "GLUCAP" in the last 168 hours.  FURTHER DISCHARGE INSTRUCTIONS:   Get Medicines reviewed and adjusted: Please take all your medications with you for your next visit with your Primary MD   Laboratory/radiological data: Please  request your Primary MD to go over all hospital tests and procedure/radiological results at the follow up, please ask your Primary MD to get all Hospital records sent to his/her office.   In some cases, they will be blood work, cultures and biopsy results pending at the time of your discharge. Please request that your primary care M.D. goes through all the records of your hospital data and follows up on  these results.   Also Note the following: If you experience worsening of your admission symptoms, develop shortness of breath, life threatening emergency, suicidal or homicidal thoughts you must seek medical attention immediately by calling 911 or calling your MD immediately  if symptoms less severe.   You must read complete instructions/literature along with all the possible adverse reactions/side effects for all the Medicines you take and that have been prescribed to you. Take any new Medicines after you have completely understood and accpet all the possible adverse reactions/side effects.    Do not drive when taking Pain medications or sleeping medications (Benzodaizepines)   Do not take more than prescribed Pain, Sleep and Anxiety Medications. It is not advisable to combine anxiety,sleep and pain medications without talking with your primary care practitioner   Special Instructions: If you have smoked or chewed Tobacco  in the last 2 yrs please stop smoking, stop any regular Alcohol  and or any Recreational drug use.   Wear Seat belts while driving.   Please note: You were cared for by a hospitalist during your hospital stay. Once you are discharged, your primary care physician will handle any further medical issues. Please note that NO REFILLS for any discharge medications will be authorized once you are discharged, as it is imperative that you return to your primary care physician (or establish a relationship with a primary care physician if you do not have one) for your post hospital discharge needs so that they can reassess your need for medications and monitor your lab values.     Signed:  Florencia Reasons MD, PhD, FACP  [facility] 02/02/2022, 12:09 PM

## 2022-02-26 NOTE — Progress Notes (Deleted)
Cardiology Office Note:    Date:  02/26/2022   ID:  ODYSSEY VASBINDER, DOB 05-14-1940, MRN [id number]  PCP:  Lubertha Sayres, [facility] Cardiologist:  Fransico Him, MD { Click to update primary MD,subspecialty MD or APP then REFRESH:1}  *** Referring MD: Lubertha Sayres, FNP   Chief Complaint:  No chief complaint on file. {Click here for Visit Info    :1}    History of Present Illness:   [name] is a 81 y.o. female with     a history of anxiety, asthma, anemia, CKD status post left nephrectomy and GERD who saw Dr. Radford Pax 09/06/21 for severe LVEF 25% found on echo. Could be viral but stress PET CTA ordered. She was started on losartan with plans to transition to entresto if tolerated and slowly add carvedilol and spiro. No SGLPT2i due to recent UTI and no coronary CTA b/c of 1 kidney   Patient was since admitted to hospital with acute metabolic encephalopathy and myoclonic jerks that resolved. Gabapentin stopped and was to see if Risperdal could by stopped by psychiatrist.     I saw the patient 11/29/21 and BP running low so I decreased lasix 20 mg daily.  PET CT stress showed no ischemia. Liver cysts noted on non cardiac portion to be f/u PCP.Dr. Radford Pax ordered cMRI to rule out infiltrative myocardial process.   I saw patient 12/19/21 with dizziness and fall and was orthostatic. I stopped lasix and losartan, compression hose, increase water. F/u 01/09/22 BP was better but she was still dizzy and unsteady. I didn't restart any meds.   Patient was admitted 01/29/22 with a small subdural hematoma after a fall.  She also had a UTI. SIADH treated with hypertonic saline.   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  Dr. Radford Pax referred her to advanced heart failure clinic    Current Medications: No outpatient medications have been marked as taking for the 03/12/22 encounter  (Appointment) with Imogene Burn, PA-C.    Allergies:   Haloperidol, Aripiprazole, and Chlorpheniramine-pse-ibuprofen   Social History   Tobacco Use   Smoking status: Never   Smokeless tobacco: Never  Vaping Use   Vaping Use: Never used  Substance Use Topics   Alcohol use: Not Currently    Comment: occassionally   Drug use: No    Family Hx: The patient's family history includes COPD in her father; Colon polyps in an other family member; Diabetes in her paternal aunt and paternal grandmother; Heart attack in her father; Heart failure in her mother; Hyperlipidemia in her mother; Hypertension in her father and mother; Stroke in her mother. There is no history of Stomach cancer, Rectal cancer, Esophageal cancer, or Colon cancer.  ROS     Physical Exam:    VS:  There were no vitals taken for this visit.    Wt Readings from Last 3 Encounters:  01/23/22 178 lb 6 oz (80.9 kg)  01/09/22 184 lb 6 oz (83.6 kg)  12/19/21 179 lb (81.2 kg)    Physical Exam  GEN: Well nourished, well developed, in no acute distress  HEENT: normal  Neck: no JVD, carotid bruits, or masses Cardiac:RRR; no murmurs, rubs, or gallops  Respiratory:  clear to auscultation bilaterally, normal work of breathing GI: soft, nontender, nondistended, + BS Ext: without cyanosis, clubbing, or edema, Good distal pulses bilaterally MS: no deformity or atrophy  Skin: warm and  dry, no rash Neuro:  Alert and Oriented x 3, Strength and sensation are intact Psych: euthymic mood, full affect        EKGs/Labs/Other Test Reviewed:    EKG:  EKG is *** ordered today.  The ekg ordered today demonstrates ***  Recent Labs: 01/15/2022: TSH 1.121 01/23/2022: B Natriuretic Peptide 229.6 01/30/2022: ALT 16; Magnesium 2.0 02/02/2022: BUN 23; Creatinine, Ser 1.03; Hemoglobin 11.7; Platelets 257; Potassium 4.7; Sodium 135   Recent Lipid Panel No results for input(s): "CHOL", "TRIG", "HDL", "VLDL", "LDLCALC", "LDLDIRECT" in  the last 8760 hours.   Prior CV Studies: {Select studies to display:26339}  Cardiac MRI 03/06/22 : IMPRESSION: 1.  Moderate bilateral pleural effusions.   2.  Cystic liver lesions noted as above.   3. Moderately dilated LV, diffuse hypokinesis with septal-lateral dyssynchrony consistent with LBBB; EF 23%.   4.  Normal RV size with EF 46%.   5. Nonspecific RV insertion site LGE, this can be seen with pressure/volume overload.   6. Borderline elevated extracellular volume percentage, suggesting some replacement fibrosis.   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   [name]     Electronically Signed   By: Loralie Champagne M.D.   On: 03/06/2022 16:21  PET stress 12/05/21  Rest left ventricular function is abnormal. Rest global function is severely reduced. There were no regional wall motion abnormalities. Rest EF: 28 %. Stress left ventricular function is abnormal. Stress global function is severely reduced. There were no regional wall abnormalities. Stress EF: 35 %. End diastolic cavity size is severely enlarged. End systolic cavity size is normal.   Myocardial blood flow was computed to be 0.35m/g/min at rest and 1.316mg/min at stress. Global myocardial blood flow reserve was 1.51 and was abnormal. This finding can be seen in both microvascular disease and in severe LV dysfunction; given LVEF at rest, the latter is suspected.   Coronary calcium was present on the attenuation correction CT images. Severe coronary calcifications were present. Coronary calcifications were present in the left anterior descending artery and right coronary artery distribution(s).   Findings are consistent with no prior ischemia. The study is high risk due to LVEF and, to a lesser extent, due to MBFR.   Read by MaRudean HaskellD FASE   CLINICAL DATA:  This over-read does not include interpretation of cardiac or coronary anatomy or pathology. The Cardiac PET CT interpretation  by the cardiologist is attached.   COMPARISON:  [facility] 05/08/2020   FINDINGS: Vascular: No acute abnormality.   Mediastinum/Nodes: No mass or adenopathy identified. Visualized portions of the lower airway and esophagus are unremarkable.   Lungs/Pleura: Cardiac enlargement is identified which has mass effect upon the lingula and left lower lobe with adjacent passive atelectasis. No signs of pleural effusion or airspace consolidation. No suspicious pulmonary nodule or mass.   Upper Abdomen: There are multiple cysts identified within both lobes of the liver. The largest is in the right lobe and measures at least 9.7 cm, image 83/3. These appear unchanged compared with the previous exam.   Musculoskeletal: No acute or suspicious osseous findings. Thoracic degenerative disc disease noted.   IMPRESSION: 1. No significant noncardiac supplemental findings. 2. Liver cysts.     Electronically Signed   By: TaKerby Moors.D.   On: 12/05/2021 12:05         Risk Assessment/Calculations/Metrics:   {Does this patient have ATRIAL FIBRILLATION?[phone]}     No BP recorded.  {Refresh Note OR Click here to  enter BP  :1}***    ASSESSMENT & PLAN:   No problem-specific Assessment & Plan notes found for this encounter.   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 Viral from covid 19. Stress PET CTA  8/29. no ischemia, cMRI 03/06/22 ordered by Dr. Radford Pax was consistent with nonischemic cardiomyopathy ejection fraction 23% and moderate bilateral pleural effusions.  She was referred to advanced heart failure clinic..Started on losartan.  Patient was orthostatic and hasn't tolerated treatment. She had a fall 01/29/22 with subdural hematoma. F/u in [facility] office and will transfer care since Dr. Radford Pax won't be going there any longer.   Orthostatic hypotension as above limiting heart failure treatment.   Mild MR   CKD Left nephrectomy              {Are you ordering a CV  Procedure (e.g. stress test, cath, DCCV, TEE, etc)?   Press F2        :[id number]}   Dispo:  No follow-ups on file.   Medication Adjustments/Labs and Tests Ordered: Current medicines are reviewed at length with the patient today.  Concerns regarding medicines are outlined above.  Tests Ordered: No orders of the defined types were placed in this encounter.  Medication Changes: No orders of the defined types were placed in this encounter.  Signed, Ermalinda Barrios, PA-C  02/26/2022 1:01 PM    [address], [place], [place]  [address] Phone: [phone]; Fax: [phone]

## 2022-02-28 ENCOUNTER — Telehealth: Payer: Self-pay

## 2022-02-28 NOTE — Telephone Encounter (Signed)
Called facility to f/u on faxed received on 11/17 requesting an apt for pt.  I spoke with [place] and she states the provider at the facility just wanted to confirm pt had a f/u scheduled.  Confirmed patient's upcoming apt.

## 2022-03-02 ENCOUNTER — Telehealth (HOSPITAL_COMMUNITY): Payer: Self-pay | Admitting: *Deleted

## 2022-03-02 ENCOUNTER — Other Ambulatory Visit (HOSPITAL_COMMUNITY): Payer: Medicare Other

## 2022-03-02 NOTE — Telephone Encounter (Signed)
Attempted to call patient's legal guarding regarding upcoming cardiac MRI appointment. Left message on voicemail with name and callback number  Gordy Clement RN Navigator Cardiac [facility] [phone] Office [phone] Cell

## 2022-03-02 NOTE — Telephone Encounter (Signed)
Reaching out to patient to offer assistance regarding upcoming cardiac imaging study; staff at the patient's facility verbalizes understanding of appt date/time.  They report she tested positive for COVID of Wednesday, November 22. Staff reports that patient is not allergic to anything.  Gordy Clement RN Navigator Cardiac Imaging [facility] [phone] office [phone] cell

## 2022-03-06 ENCOUNTER — Inpatient Hospital Stay (HOSPITAL_BASED_OUTPATIENT_CLINIC_OR_DEPARTMENT_OTHER)
Admission: RE | Admit: 2022-03-06 | Discharge: 2022-03-06 | Disposition: A | Discharge status: Home / Self Care | Payer: Medicare Other | Source: Ambulatory Visit | Attending: Cardiology | Admitting: Cardiology

## 2022-03-06 ENCOUNTER — Other Ambulatory Visit: Payer: Self-pay | Admitting: Cardiology

## 2022-03-06 DIAGNOSIS — R002 Palpitations: Secondary | ICD-10-CM | POA: Diagnosis not present

## 2022-03-06 DIAGNOSIS — I519 Heart disease, unspecified: Secondary | ICD-10-CM

## 2022-03-06 DIAGNOSIS — I130 Hypertensive heart and chronic kidney disease with heart failure and stage 1 through stage 4 chronic kidney disease, or unspecified chronic kidney disease: Secondary | ICD-10-CM | POA: Diagnosis not present

## 2022-03-06 MED ORDER — GADOBUTROL 1 MMOL/ML IV SOLN
10.0000 mL | Freq: Once | INTRAVENOUS | Status: AC | PRN
  Administered 2022-03-06: 7.5 mL via INTRAVENOUS

## 2022-03-07 ENCOUNTER — Encounter: Payer: Self-pay | Admitting: Cardiology

## 2022-03-07 DIAGNOSIS — I519 Heart disease, unspecified: Secondary | ICD-10-CM | POA: Insufficient documentation

## 2022-03-09 ENCOUNTER — Telehealth: Payer: Self-pay | Admitting: Cardiology

## 2022-03-09 ENCOUNTER — Inpatient Hospital Stay (HOSPITAL_COMMUNITY)
Admission: EM | Admit: 2022-03-09 | Discharge: 2022-03-12 | DRG: 291 | Disposition: A | Discharge status: Nursing Home (Includes ICF) | Payer: Medicare Other | Source: Skilled Nursing Facility | Attending: Internal Medicine | Admitting: Internal Medicine

## 2022-03-09 ENCOUNTER — Encounter (HOSPITAL_COMMUNITY): Payer: Self-pay | Admitting: Internal Medicine

## 2022-03-09 ENCOUNTER — Other Ambulatory Visit: Payer: Self-pay

## 2022-03-09 ENCOUNTER — Emergency Department (HOSPITAL_COMMUNITY): Payer: Medicare Other

## 2022-03-09 DIAGNOSIS — I5023 Acute on chronic systolic (congestive) heart failure: Secondary | ICD-10-CM | POA: Diagnosis present

## 2022-03-09 DIAGNOSIS — N1831 Chronic kidney disease, stage 3a: Secondary | ICD-10-CM | POA: Diagnosis present

## 2022-03-09 DIAGNOSIS — I130 Hypertensive heart and chronic kidney disease with heart failure and stage 1 through stage 4 chronic kidney disease, or unspecified chronic kidney disease: Principal | ICD-10-CM | POA: Diagnosis present

## 2022-03-09 DIAGNOSIS — I519 Heart disease, unspecified: Secondary | ICD-10-CM

## 2022-03-09 DIAGNOSIS — J4489 Other specified chronic obstructive pulmonary disease: Secondary | ICD-10-CM | POA: Diagnosis present

## 2022-03-09 DIAGNOSIS — E871 Hypo-osmolality and hyponatremia: Secondary | ICD-10-CM | POA: Diagnosis present

## 2022-03-09 DIAGNOSIS — Z833 Family history of diabetes mellitus: Secondary | ICD-10-CM

## 2022-03-09 DIAGNOSIS — R0602 Shortness of breath: Principal | ICD-10-CM

## 2022-03-09 DIAGNOSIS — Z905 Acquired absence of kidney: Secondary | ICD-10-CM | POA: Diagnosis not present

## 2022-03-09 DIAGNOSIS — E669 Obesity, unspecified: Secondary | ICD-10-CM | POA: Diagnosis present

## 2022-03-09 DIAGNOSIS — I428 Other cardiomyopathies: Secondary | ICD-10-CM | POA: Diagnosis present

## 2022-03-09 DIAGNOSIS — Z83438 Family history of other disorder of lipoprotein metabolism and other lipidemia: Secondary | ICD-10-CM

## 2022-03-09 DIAGNOSIS — Z886 Allergy status to analgesic agent status: Secondary | ICD-10-CM

## 2022-03-09 DIAGNOSIS — M1990 Unspecified osteoarthritis, unspecified site: Secondary | ICD-10-CM | POA: Diagnosis present

## 2022-03-09 DIAGNOSIS — Z79899 Other long term (current) drug therapy: Secondary | ICD-10-CM

## 2022-03-09 DIAGNOSIS — Z6832 Body mass index (BMI) 32.0-32.9, adult: Secondary | ICD-10-CM | POA: Diagnosis not present

## 2022-03-09 DIAGNOSIS — Z823 Family history of stroke: Secondary | ICD-10-CM | POA: Diagnosis not present

## 2022-03-09 DIAGNOSIS — K219 Gastro-esophageal reflux disease without esophagitis: Secondary | ICD-10-CM | POA: Diagnosis present

## 2022-03-09 DIAGNOSIS — Z8249 Family history of ischemic heart disease and other diseases of the circulatory system: Secondary | ICD-10-CM

## 2022-03-09 DIAGNOSIS — R0902 Hypoxemia: Secondary | ICD-10-CM

## 2022-03-09 DIAGNOSIS — I5020 Unspecified systolic (congestive) heart failure: Secondary | ICD-10-CM

## 2022-03-09 DIAGNOSIS — E222 Syndrome of inappropriate secretion of antidiuretic hormone: Secondary | ICD-10-CM | POA: Diagnosis present

## 2022-03-09 DIAGNOSIS — K769 Liver disease, unspecified: Secondary | ICD-10-CM | POA: Diagnosis present

## 2022-03-09 DIAGNOSIS — Z888 Allergy status to other drugs, medicaments and biological substances status: Secondary | ICD-10-CM

## 2022-03-09 DIAGNOSIS — J9601 Acute respiratory failure with hypoxia: Secondary | ICD-10-CM | POA: Insufficient documentation

## 2022-03-09 DIAGNOSIS — I447 Left bundle-branch block, unspecified: Secondary | ICD-10-CM | POA: Diagnosis present

## 2022-03-09 DIAGNOSIS — F200 Paranoid schizophrenia: Secondary | ICD-10-CM | POA: Diagnosis present

## 2022-03-09 DIAGNOSIS — Z86010 Personal history of colonic polyps: Secondary | ICD-10-CM

## 2022-03-09 DIAGNOSIS — I951 Orthostatic hypotension: Secondary | ICD-10-CM | POA: Diagnosis present

## 2022-03-09 DIAGNOSIS — Z83719 Family history of colon polyps, unspecified: Secondary | ICD-10-CM

## 2022-03-09 DIAGNOSIS — K5790 Diverticulosis of intestine, part unspecified, without perforation or abscess without bleeding: Secondary | ICD-10-CM | POA: Diagnosis present

## 2022-03-09 DIAGNOSIS — Z8616 Personal history of COVID-19: Secondary | ICD-10-CM | POA: Diagnosis not present

## 2022-03-09 DIAGNOSIS — Z96652 Presence of left artificial knee joint: Secondary | ICD-10-CM | POA: Diagnosis present

## 2022-03-09 DIAGNOSIS — Z825 Family history of asthma and other chronic lower respiratory diseases: Secondary | ICD-10-CM

## 2022-03-09 DIAGNOSIS — Z9049 Acquired absence of other specified parts of digestive tract: Secondary | ICD-10-CM

## 2022-03-09 DIAGNOSIS — R002 Palpitations: Secondary | ICD-10-CM

## 2022-03-09 DIAGNOSIS — Z7951 Long term (current) use of inhaled steroids: Secondary | ICD-10-CM

## 2022-03-09 DIAGNOSIS — Z1152 Encounter for screening for COVID-19: Secondary | ICD-10-CM

## 2022-03-09 LAB — URINALYSIS, ROUTINE W REFLEX MICROSCOPIC
Bacteria, UA: NONE SEEN
Bilirubin Urine: NEGATIVE
Glucose, UA: NEGATIVE mg/dL
Hgb urine dipstick: NEGATIVE
Ketones, ur: NEGATIVE mg/dL
Nitrite: NEGATIVE
Protein, ur: NEGATIVE mg/dL
Specific Gravity, Urine: 1.005 (ref 1.005–1.030)
pH: 5 (ref 5.0–8.0)

## 2022-03-09 LAB — COMPREHENSIVE METABOLIC PANEL
ALT: 22 U/L (ref 0–44)
AST: 22 U/L (ref 15–41)
Albumin: 3.6 g/dL (ref 3.5–5.0)
Alkaline Phosphatase: 96 U/L (ref 38–126)
Anion gap: 9 (ref 5–15)
BUN: 19 mg/dL (ref 8–23)
CO2: 25 mmol/L (ref 22–32)
Calcium: 8.5 mg/dL — ABNORMAL LOW (ref 8.9–10.3)
Chloride: 98 mmol/L (ref 98–111)
Creatinine, Ser: 1.02 mg/dL — ABNORMAL HIGH (ref 0.44–1.00)
GFR, Estimated: 56 mL/min — ABNORMAL LOW (ref >60)
Glucose, Bld: 97 mg/dL (ref 70–99)
Potassium: 4.3 mmol/L (ref 3.5–5.1)
Sodium: 132 mmol/L — ABNORMAL LOW (ref 135–145)
Total Bilirubin: 0.5 mg/dL (ref 0.3–1.2)
Total Protein: 5.9 g/dL — ABNORMAL LOW (ref 6.5–8.1)

## 2022-03-09 LAB — BRAIN NATRIURETIC PEPTIDE: B Natriuretic Peptide: 1059 pg/mL — ABNORMAL HIGH (ref 0.0–100.0)

## 2022-03-09 LAB — CBC
HCT: 38.3 % (ref 36.0–46.0)
Hemoglobin: 12.1 g/dL (ref 12.0–15.0)
MCH: 29.6 pg (ref 26.0–34.0)
MCHC: 31.6 g/dL (ref 30.0–36.0)
MCV: 93.6 fL (ref 80.0–100.0)
Platelets: 232 10*3/uL (ref 150–400)
RBC: 4.09 MIL/uL (ref 3.87–5.11)
RDW: 14 % (ref 11.5–15.5)
WBC: 7.2 10*3/uL (ref 4.0–10.5)
nRBC: 0 % (ref 0.0–0.2)

## 2022-03-09 LAB — SARS CORONAVIRUS 2 BY RT PCR: SARS Coronavirus 2 by RT PCR: NEGATIVE

## 2022-03-09 LAB — TROPONIN I (HIGH SENSITIVITY)
Troponin I (High Sensitivity): 32 ng/L — ABNORMAL HIGH (ref <18)
Troponin I (High Sensitivity): 29 ng/L — ABNORMAL HIGH (ref <18)

## 2022-03-09 MED ORDER — FLUTICASONE PROPIONATE 50 MCG/ACT NA SUSP
2.0000 | Freq: Every day | NASAL | Status: DC
  Administered 2022-03-09 – 2022-03-12 (×4): 2 via NASAL
  Filled 2022-03-09: qty 16

## 2022-03-09 MED ORDER — OCUVITE-LUTEIN PO CAPS
1.0000 | ORAL_CAPSULE | Freq: Two times a day (BID) | ORAL | Status: DC
  Administered 2022-03-09 – 2022-03-12 (×6): 1 via ORAL
  Filled 2022-03-09 (×6): qty 1

## 2022-03-09 MED ORDER — SODIUM CHLORIDE 0.9% FLUSH
3.0000 mL | Freq: Two times a day (BID) | INTRAVENOUS | Status: DC
  Administered 2022-03-09 – 2022-03-12 (×5): 3 mL via INTRAVENOUS

## 2022-03-09 MED ORDER — ACETAMINOPHEN 325 MG PO TABS
650.0000 mg | ORAL_TABLET | ORAL | Status: DC | PRN
  Administered 2022-03-10 – 2022-03-12 (×3): 650 mg via ORAL
  Filled 2022-03-09 (×3): qty 2

## 2022-03-09 MED ORDER — FUROSEMIDE 10 MG/ML IJ SOLN
20.0000 mg | Freq: Once | INTRAMUSCULAR | Status: AC
  Administered 2022-03-09: 20 mg via INTRAVENOUS
  Filled 2022-03-09: qty 2

## 2022-03-09 MED ORDER — SODIUM CHLORIDE 0.9% FLUSH: 3.0000 mL | INTRAVENOUS | Status: DC | PRN

## 2022-03-09 MED ORDER — MIDODRINE HCL 5 MG PO TABS
5.0000 mg | ORAL_TABLET | Freq: Two times a day (BID) | ORAL | Status: DC
  Administered 2022-03-09 – 2022-03-12 (×6): 5 mg via ORAL
  Filled 2022-03-09 (×6): qty 1

## 2022-03-09 MED ORDER — AMANTADINE HCL 100 MG PO CAPS
100.0000 mg | ORAL_CAPSULE | Freq: Two times a day (BID) | ORAL | Status: DC
  Administered 2022-03-09 – 2022-03-12 (×7): 100 mg via ORAL
  Filled 2022-03-09 (×7): qty 1

## 2022-03-09 MED ORDER — GUAIFENESIN-DM 100-10 MG/5ML PO SYRP
5.0000 mL | ORAL_SOLUTION | ORAL | Status: DC | PRN
  Administered 2022-03-09 – 2022-03-11 (×3): 5 mL via ORAL
  Filled 2022-03-09 (×3): qty 5

## 2022-03-09 MED ORDER — SODIUM CHLORIDE 0.9 % IV SOLN: 250.0000 mL | INTRAVENOUS | Status: DC | PRN

## 2022-03-09 MED ORDER — FUROSEMIDE 10 MG/ML IJ SOLN
20.0000 mg | Freq: Two times a day (BID) | INTRAMUSCULAR | Status: AC
  Administered 2022-03-09 – 2022-03-10 (×3): 20 mg via INTRAVENOUS
  Filled 2022-03-09 (×3): qty 2

## 2022-03-09 MED ORDER — MIRTAZAPINE 15 MG PO TABS
7.5000 mg | ORAL_TABLET | Freq: Every day | ORAL | Status: DC
  Administered 2022-03-09 – 2022-03-11 (×3): 7.5 mg via ORAL
  Filled 2022-03-09 (×3): qty 1

## 2022-03-09 MED ORDER — ONDANSETRON HCL 4 MG/2ML IJ SOLN: 4.0000 mg | Freq: Four times a day (QID) | INTRAMUSCULAR | Status: DC | PRN

## 2022-03-09 MED ORDER — PANTOPRAZOLE SODIUM 40 MG PO TBEC
40.0000 mg | DELAYED_RELEASE_TABLET | Freq: Every day | ORAL | Status: DC
  Administered 2022-03-09 – 2022-03-12 (×4): 40 mg via ORAL
  Filled 2022-03-09 (×4): qty 1

## 2022-03-09 MED ORDER — LORATADINE 10 MG PO TABS
10.0000 mg | ORAL_TABLET | Freq: Every day | ORAL | Status: DC
  Administered 2022-03-09 – 2022-03-12 (×4): 10 mg via ORAL
  Filled 2022-03-09 (×4): qty 1

## 2022-03-09 MED ORDER — MELATONIN 3 MG PO TABS
3.0000 mg | ORAL_TABLET | Freq: Every day | ORAL | Status: DC
  Administered 2022-03-09 – 2022-03-11 (×3): 3 mg via ORAL
  Filled 2022-03-09 (×3): qty 1

## 2022-03-09 MED ORDER — ENOXAPARIN SODIUM 40 MG/0.4ML IJ SOSY
40.0000 mg | PREFILLED_SYRINGE | INTRAMUSCULAR | Status: DC
  Administered 2022-03-09 – 2022-03-12 (×4): 40 mg via SUBCUTANEOUS
  Filled 2022-03-09 (×4): qty 0.4

## 2022-03-09 MED ORDER — RISPERIDONE 1 MG PO TABS
1.0000 mg | ORAL_TABLET | Freq: Every day | ORAL | Status: DC
  Administered 2022-03-09 – 2022-03-11 (×3): 1 mg via ORAL
  Filled 2022-03-09 (×3): qty 1

## 2022-03-09 MED ORDER — OYSTER SHELL CALCIUM/D3 500-5 MG-MCG PO TABS
1.0000 | ORAL_TABLET | Freq: Every day | ORAL | Status: DC
  Administered 2022-03-10 – 2022-03-12 (×3): 1 via ORAL
  Filled 2022-03-09 (×3): qty 1

## 2022-03-09 MED ORDER — ORAL CARE MOUTH RINSE: 15.0000 mL | OROMUCOSAL | Status: DC | PRN

## 2022-03-09 MED ORDER — FLUTICASONE FUROATE-VILANTEROL 200-25 MCG/ACT IN AEPB
1.0000 | INHALATION_SPRAY | Freq: Every day | RESPIRATORY_TRACT | Status: DC
  Administered 2022-03-10 – 2022-03-12 (×3): 1 via RESPIRATORY_TRACT
  Filled 2022-03-09: qty 28

## 2022-03-09 NOTE — ED Triage Notes (Signed)
Pt BIB [facility] from [facility]. Started having chest pain last night. Pt denies any pain in triage but states she felt SOB all night and was having trouble sleeping.   Per EMS pt tested + for COVID last week.

## 2022-03-09 NOTE — Telephone Encounter (Signed)
Pt niece calling back for MRI results

## 2022-03-09 NOTE — ED Notes (Signed)
Patient states complaints of burning with urination. EDP notified.

## 2022-03-09 NOTE — Consult Note (Signed)
Cardiology Consultation   Patient ID: [name] MRN: [id number]; DOB: [birth year]  Admit date: 03/09/2022 Date of Consult: 03/09/2022  PCP:  Lubertha Sayres, [place] [facility] Cardiologist:  Fransico Him, MD        Patient Profile:   [name] is a 81 y.o. female with a hx of HFrEF (EF 25% by echo in 08/2021 with Cardiac PET in 11/2021 showing no evidence of ischemia, EF at 30-35% in 01/2022 and LVEF 23% on cardiac MRI on 03/06/22 ), CKD (s/p left nephrectomy), anemia, asthma and GERD who is being seen 03/09/2022 for the evaluation of CHF at the request of Dr. Carles Collet.  History of Present Illness:   Ms. Paules was last examined by Ermalinda Barrios, PA-C on 01/09/2022 and reported her weight was variable while at a rehab facility and she was trying to increase her water consumption. Her weight was at 184 lbs and she was to remain off of Lasix and Losartan given significant orthostatic hypotension which had led to a fall. She was not on any cardiac medications for her cardiomyopathy at that time due to hypotension.   In the interim, she was admitted to [facility] later that month after suffering a fall and hitting her head. Was found to have a subdural hematoma but she did not require intervention from neurosurgery. Received fluids intermittently during admission due to hyponatremia. She was continued on Midodrine 5 mg twice daily at the time of discharge.    She did have a previously scheduled cardiac MRI performed on 03/06/2022 which showed moderate bilateral pleural effusions and cystic liver lesions. EF was estimated at 23% with diffuse hypokinesis and septal-lateral dyssynchrony consistent with LBBB. Study was overall consistent with nonischemic cardiomyopathy.  She presented to [facility] ED this morning from [facility] for evaluation of shortness of breath and chest pain. Mentions difficulty breathing at night and having to sleep propped up with more pillows  with improvement in symptoms. No specific abdominal distention or lower extremity edema. Reported having tested positive for COVID last week.  Oxygen saturations were initially at 84% on room air and she was placed on 4 L nasal cannula with improvement. Initial labs showed WBC 7.2, Hgb 12.1, platelets 232, Na+ 132, K+ 4.3 and creatinine 1.02 (close to baseline). BNP elevated to 1059 (previously 229 in 01/2022).Initial and repeat Hs Troponin values flat at 32 and 29. CXR consistent with CHF. EKG shows NSR, HR 90 with known LBBB.   She received IV Lasix 20 mg while in the ED and has been started on 20 mg twice daily by the admitting team.  Past Medical History:  Diagnosis Date   Anemia 1990   hx of   Anxiety    Arthritis 2004   Asthma 1995   uses inhaler   Chronic kidney disease 2010   LEFT kidney removed   Colon polyp    Complication of anesthesia 1966   first surgery she had- had a hard time waking up from surgery   Delusional disorder ([place])    Six delusional disorder   Diverticulosis 2003   GERD (gastroesophageal reflux disease)    on meds   Schizophrenia ([place])    Severe left ventricular systolic dysfunction (LVSD)    EF 23% by cardiac MRI    Past Surgical History:  Procedure Laterality Date   APPENDECTOMY  1966   CATARACT EXTRACTION     Bilateral   CHOLECYSTECTOMY  2004   COLON SURGERY  2008   COLONOSCOPY  2013   JP-MAC-moviprep(good)-TA   COLOSTOMY     COLOSTOMY TAKEDOWN     DIAGNOSTIC LAPAROSCOPY     exploratory for endometriosis   HEEL SPUR SURGERY     Right, Metal Plate in [place]  2010   NEPHRECTOMY Left 9562   As complication of partial colectomy for diverticulitis   POLYPECTOMY  2013   TA   TONSILLECTOMY     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   Procedure: LEFT TOTAL KNEE ARTHROPLASTY;  Surgeon: Susa Day, MD;  Location: [facility]S;  Service: Orthopedics;  Laterality: Left;  Adductor Block     Home Medications:  Prior to  Admission medications   Medication Sig Start Date End Date Taking? Authorizing Provider  acetaminophen (TYLENOL) 325 MG tablet Take 650 mg by mouth every 6 (six) hours as needed for mild pain, moderate pain or fever.   Yes [provider]  amantadine (SYMMETREL) 100 MG capsule Take 100 mg by mouth 2 (two) times daily. 05/03/21  Yes [provider]  antiseptic oral rinse (BIOTENE) LIQD 15 mLs by Mouth Rinse route See admin instructions. Use about 15 mL for 30 seconds then spit out up to 5 times daily as needed for dry cough and sore throat.   Yes [provider]  BREO ELLIPTA 200-25 MCG/INH AEPB Inhale 1 puff into the lungs daily. 06/22/20  Yes [provider]  Calcium Carb-Cholecalciferol (CALCIUM 600+D3 PO) Take 1 tablet by mouth daily.   Yes [provider]  cetirizine (ZYRTEC) 10 MG tablet Take 10 mg by mouth daily.   Yes [provider]  fluticasone (FLONASE) 50 MCG/ACT nasal spray Place 2 sprays into both nostrils daily. 05/02/21  Yes [provider]  ibuprofen (ADVIL) 400 MG tablet Take 400 mg by mouth every 8 (eight) hours as needed for cramping, moderate pain or fever.   Yes [provider]  Ipratropium-Albuterol (COMBIVENT RESPIMAT) 20-100 MCG/ACT AERS respimat Inhale 1 puff into the lungs every 6 (six) hours as needed for shortness of breath or wheezing.   Yes [provider]  melatonin 3 MG TABS tablet Take 3 mg by mouth at bedtime.   Yes [provider]  midodrine (PROAMATINE) 5 MG tablet Take 1 tablet (5 mg total) by mouth 2 (two) times daily with a meal. Her systolic blood pressure in the hospital is around 110 to 130 while taking midrodrine 51m bid, she may not need midodrine long term, pcp to continue to monitor 02/02/22  Yes XFlorencia Reasons MD  mirtazapine (REMERON) 7.5 MG tablet Take 1 tablet (7.5 mg total) by mouth at bedtime. 01/23/22  Yes SThurnell Lose MD  Multiple Vitamins-Minerals (PRESERVISION  AREDS 2) CAPS Take 1 capsule by mouth 2 (two) times daily with a meal.   Yes [provider]  omeprazole (PRILOSEC) 20 MG capsule Take 1 capsule (20 mg total) by mouth daily. Patient taking differently: Take 20 mg by mouth 2 (two) times daily. 06/19/19  Yes Palumbo, April, MD  risperiDONE (RISPERDAL) 1 MG tablet Take 1 tablet (1 mg total) by mouth at bedtime. 10/27/21  Yes Johnson, Clanford L, MD  polyethylene glycol (MIRALAX / GLYCOLAX) 17 g packet Take 17 g by mouth daily. Patient not taking: Reported on 03/09/2022 02/03/22   XFlorencia Reasons MD    Inpatient Medications: Scheduled Meds:  Continuous Infusions:  PRN Meds:   Allergies:    Allergies  Allergen Reactions   Haloperidol Anaphylaxis  Aripiprazole Other (See Comments)    Bad thoughts    Chlorpheniramine-Pse-Ibuprofen     Other reaction(s): Cough    Social History:   Social History   Socioeconomic History   Marital status: Legally Separated    Spouse name: Not on file   Number of children: 10   Years of education: Not on file   Highest education level: Not on file  Occupational History   Occupation: retired  Tobacco Use   Smoking status: Never   Smokeless tobacco: Never  Vaping Use   Vaping Use: Never used  Substance and Sexual Activity   Alcohol use: Not Currently    Comment: occassionally   Drug use: No   Sexual activity: Not Currently  Other Topics Concern   Not on file  Social History Narrative   Not on file   Social Determinants of Health   Financial Resource Strain: Not on file  Food Insecurity: No Food Insecurity (01/30/2022)   Hunger Vital Sign    Worried About Running Out of Food in the Last Year: Never true    Ran Out of Food in the Last Year: Never true  Transportation Needs: No Transportation Needs (01/30/2022)   PRAPARE - Hydrologist (Medical): No    Lack of Transportation (Non-Medical): No  Physical Activity: Not on file  Stress: Not on file  Social  Connections: Not on file  Intimate Partner Violence: Not At Risk (01/30/2022)   Humiliation, Afraid, Rape, and Kick questionnaire    Fear of Current or Ex-Partner: No    Emotionally Abused: No    Physically Abused: No    Sexually Abused: No    Family History:    Family History  Problem Relation Age of Onset   Hypertension Mother    Heart failure Mother    Hyperlipidemia Mother    Stroke Mother    Hypertension Father    Heart attack Father    COPD Father    Diabetes Paternal Aunt    Diabetes Paternal Grandmother    Colon polyps Other        niece   Stomach cancer Neg Hx    Rectal cancer Neg Hx    Esophageal cancer Neg Hx    Colon cancer Neg Hx      ROS:  Please see the history of present illness.   All other ROS reviewed and negative.     Physical Exam/Data:   Vitals:   03/09/22 0830 03/09/22 0900 03/09/22 0930 03/09/22 1010  BP: 130/84 (!) 143/99 137/89   Pulse: 85 93 95   Resp: (!) 24 (!) 21 20   Temp:    98.1 F (36.7 C)  TempSrc:    Oral  SpO2: 98% 100% 98%   Weight:      Height:       No intake or output data in the 24 hours ending 03/09/22 1129    03/09/2022    5:40 AM 01/23/2022    4:21 AM 01/22/2022    4:38 AM  Last 3 Weights  Weight (lbs) 178 lb 9.2 oz 178 lb 6 oz 178 lb 9.2 oz  Weight (kg) 81 kg 80.91 kg 81 kg     Body mass index is 32.66 kg/m.  General: Pleasant, elderly female appearing in no acute distress. HEENT: normal Neck:  JVD elevated till half way of neck Vascular: No carotid bruits; Distal pulses 2+ bilaterally Cardiac:  normal S1, S2; RRR;  Lungs:  clear to auscultation  bilaterally, no wheezing, rhonchi or rales  Abd: soft, nontender, no hepatomegaly  Ext: no edema Musculoskeletal:  No deformities, BUE and BLE strength normal and equal Skin: warm and dry  Neuro:  CNs 2-12 intact, no focal abnormalities noted Psych:  Normal affect   EKG:  The EKG was personally reviewed and demonstrates: NSR, HR 90 with known LBBB.    Relevant CV Studies:  Cardiac PET: 11/2021 Rest left ventricular function is abnormal. Rest global function is severely reduced. There were no regional wall motion abnormalities. Rest EF: 28 %. Stress left ventricular function is abnormal. Stress global function is severely reduced. There were no regional wall abnormalities. Stress EF: 35 %. End diastolic cavity size is severely enlarged. End systolic cavity size is normal.   Myocardial blood flow was computed to be 0.43m/g/min at rest and 1.332mg/min at stress. Global myocardial blood flow reserve was 1.51 and was abnormal. This finding can be seen in both microvascular disease and in severe LV dysfunction; given LVEF at rest, the latter is suspected.   Coronary calcium was present on the attenuation correction CT images. Severe coronary calcifications were present. Coronary calcifications were present in the left anterior descending artery and right coronary artery distribution(s).   Findings are consistent with no prior ischemia. The study is high risk due to LVEF and, to a lesser extent, due to MBFR.   Read by MaRudean HaskellD FASE   Echocardiogram: 01/2022 IMPRESSIONS     1. Left ventricular ejection fraction, by estimation, is 30 to 35%. The  left ventricle has moderately decreased function. The left ventricle  demonstrates global hypokinesis. Left ventricular diastolic parameters are  consistent with Grade I diastolic  dysfunction (impaired relaxation). Elevated left atrial pressure.   2. Right ventricular systolic function is normal. The right ventricular  size is normal.   3. Left atrial size was moderately dilated.   4. Moderate pleural effusion in the left lateral region.   5. The mitral valve is abnormal. Mild to moderate mitral valve  regurgitation. No evidence of mitral stenosis.   6. The aortic valve is tricuspid. Aortic valve regurgitation is not  visualized. No aortic stenosis is present.   7. The inferior  vena cava is normal in size with greater than 50%  respiratory variability, suggesting right atrial pressure of 3 mmHg.   Cardiac MRI: 03/06/2022 IMPRESSION: 1.  Moderate bilateral pleural effusions.   2.  Cystic liver lesions noted as above.   3. Moderately dilated LV, diffuse hypokinesis with septal-lateral dyssynchrony consistent with LBBB; EF 23%.   4.  Normal RV size with EF 46%.   5. Nonspecific RV insertion site LGE, this can be seen with pressure/volume overload.   6. Borderline elevated extracellular volume percentage, suggesting some replacement fibrosis.   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  Laboratory Data:  High Sensitivity Troponin:   Recent Labs  Lab 03/09/22 0555 03/09/22 0748  TROPONINIHS 32* 29*     Chemistry Recent Labs  Lab 03/09/22 0555  NA 132*  K 4.3  CL 98  CO2 25  GLUCOSE 97  BUN 19  CREATININE 1.02*  CALCIUM 8.5*  GFRNONAA 56*  ANIONGAP 9    Recent Labs  Lab 03/09/22 0555  PROT 5.9*  ALBUMIN 3.6  AST 22  ALT 22  ALKPHOS 96  BILITOT 0.5   Lipids No results for input(s): "CHOL", "TRIG", "HDL", "LABVLDL", "LDLCALC", "CHOLHDL" in the last 168 hours.  Hematology Recent Labs  Lab 03/09/22 0555  WBC  7.2  RBC 4.09  HGB 12.1  HCT 38.3  MCV 93.6  MCH 29.6  MCHC 31.6  RDW 14.0  PLT 232   Thyroid No results for input(s): "TSH", "FREET4" in the last 168 hours.  BNP Recent Labs  Lab 03/09/22 0555  BNP 1,059.0*    DDimer No results for input(s): "DDIMER" in the last 168 hours.   Radiology/Studies:  DG Chest Port 1 View  Result Date: 03/09/2022 CLINICAL DATA:  Shortness of breath EXAM: PORTABLE CHEST 1 VIEW COMPARISON:  01/31/2022 FINDINGS: Cardiomegaly and congested appearance of vessels. Dense opacity at the left base primarily attributed to pleural fluid based on abdominal CT 02/01/2022. No pneumothorax. IMPRESSION: CHF pattern with small left pleural effusion. Electronically Signed   By: Jorje Guild M.D.   On: 03/09/2022 06:25     Assessment and Plan:   Patient is 81 year old F known to have NICM LVEF 23% on cardiac MRI from 03/06/2022, CKD status post nephrectomy, asthma/COPD was sent from SNF for acute SOB since last night. She tested positive for COVID last week.  # ADHF/wet and warm/NICM LVEF 23% with no device/NYHA III-IV/ACC AHA stage C -Continue IV Lasix 20 mg once daily -Hold beta-blocker -Patient was taken off GDMT due to soft blood pressures. Continue midodrine to allow diuresis. -Daily weights, ins and outs and 1.2 L fluid restriction  # Acute hypoxic respiratory failure (differential diagnoses include ADHF and COVID as she was tested positive last week) -COVID management per primary team and ADHF management as above.  I have spent a total of 52 minutes with patient reviewing chart , telemetry, EKGs, labs and examining patient as well as establishing an assessment and plan that was discussed with the patient.  > 50% of time was spent in direct patient care.     For questions or updates, please contact [place] Please consult [email or web address] for contact info under    Signed, Harlie Buening Fidel Levy, MD 03/09/2022 11:29 AM

## 2022-03-09 NOTE — H&P (Addendum)
History and Physical    Patient: [name] NWG:[id number] DOB: 1941-04-08 DOA: 03/09/2022 DOS: the patient was seen and examined on 03/09/2022 PCP: Lubertha Sayres, FNP  Patient coming from: ALF/ILF  Chief Complaint: No chief complaint on file.  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  She also complained of some orthopnea type symptoms in the past few days.  She denies any fevers, chills, headache, chest pain, nausea, vomiting, diarrhea, abdominal pain, dysuria, hematuria, hematochezia, melena.  Notably, the patient was recently admitted to the hospital from 01/29/2022 to 02/02/2022 after mechanical fall resulting in subdural hematoma.  She was managed nonoperatively.  She was also treated for UTI.  Prior to that, she had a hospitalization from 01/14/2022 to 01/23/2022 secondary to hyponatremia which was felt to be due to SIADH and decompensated HFrEF.  The patient required hypertonic saline for short period of time and was diuresed.  Her sodium has ranged from 132-135. In the ED, the patient was afebrile and hemodynamically stable.  Oxygen saturation was 84% on room air.  She was placed on 4 L with saturation 96-97%.  BMP showed a sodium 132, potassium 4.3, bicarbonate 25, serum creatinine 1.02.  AST 22, ALT 22, alk phosphatase 96, total bilirubin 0.5.  WBC 7.2, hemoglobin 12.1, platelets 232.  Chest x-ray showed vascular congestion and small left pleural effusion.  BNP was 1059.  Troponin 32>> 29.  EKG shows sinus rhythm with left bundle branch block.  The patient was given furosemide 20 mg IV x 1.  Review of Systems: As mentioned in the history of present illness. All other systems reviewed and are negative. Past Medical History:  Diagnosis Date   Anemia 1990   hx of   Anxiety    Arthritis 2004    Asthma 1995   uses inhaler   Chronic kidney disease 2010   LEFT kidney removed   Colon polyp    Complication of anesthesia 1966   first surgery she had- had a hard time waking up from surgery   Delusional disorder ([place])    Six delusional disorder   Diverticulosis 2003   GERD (gastroesophageal reflux disease)    on meds   Schizophrenia ([place])    Severe left ventricular systolic dysfunction (LVSD)    EF 23% by cardiac MRI   Past Surgical History:  Procedure Laterality Date   APPENDECTOMY  1966   CATARACT EXTRACTION     Bilateral   CHOLECYSTECTOMY  2004   COLON SURGERY  2008   COLONOSCOPY  2013   JP-MAC-moviprep(good)-TA   COLOSTOMY     COLOSTOMY TAKEDOWN     DIAGNOSTIC LAPAROSCOPY     exploratory for endometriosis   HEEL SPUR SURGERY     Right, Metal Plate in [place]  2010   NEPHRECTOMY Left 5784   As complication of partial colectomy for diverticulitis   POLYPECTOMY  2013   TA   TONSILLECTOMY     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   Procedure: LEFT TOTAL KNEE ARTHROPLASTY;  Surgeon: Susa Day, MD;  Location: [facility];  Service: Orthopedics;  Laterality: Left;  Adductor Block   Social History:  reports that she has never smoked. She has never used smokeless tobacco. She reports that she does not currently use alcohol. She reports that she does not use  drugs.  Allergies  Allergen Reactions   Haloperidol Anaphylaxis   Aripiprazole Other (See Comments)    Bad thoughts    Chlorpheniramine-Pse-Ibuprofen     Other reaction(s): Cough    Family History  Problem Relation Age of Onset   Hypertension Mother    Heart failure Mother    Hyperlipidemia Mother    Stroke Mother    Hypertension Father    Heart attack Father    COPD Father    Diabetes Paternal Aunt    Diabetes Paternal Grandmother    Colon polyps Other        niece   Stomach cancer Neg Hx    Rectal cancer Neg Hx    Esophageal cancer Neg Hx    Colon cancer Neg Hx      Prior to Admission medications   Medication Sig Start Date End Date Taking? Authorizing Provider  acetaminophen (TYLENOL) 325 MG tablet Take 650 mg by mouth every 6 (six) hours as needed for mild pain, moderate pain or fever.   Yes [provider]  amantadine (SYMMETREL) 100 MG capsule Take 100 mg by mouth 2 (two) times daily. 05/03/21  Yes [provider]  antiseptic oral rinse (BIOTENE) LIQD 15 mLs by Mouth Rinse route See admin instructions. Use about 15 mL for 30 seconds then spit out up to 5 times daily as needed for dry cough and sore throat.   Yes [provider]  BREO ELLIPTA 200-25 MCG/INH AEPB Inhale 1 puff into the lungs daily. 06/22/20  Yes [provider]  Calcium Carb-Cholecalciferol (CALCIUM 600+D3 PO) Take 1 tablet by mouth daily.   Yes [provider]  cetirizine (ZYRTEC) 10 MG tablet Take 10 mg by mouth daily.   Yes [provider]  fluticasone (FLONASE) 50 MCG/ACT nasal spray Place 2 sprays into both nostrils daily. 05/02/21  Yes [provider]  ibuprofen (ADVIL) 400 MG tablet Take 400 mg by mouth every 8 (eight) hours as needed for cramping, moderate pain or fever.   Yes [provider]  Ipratropium-Albuterol (COMBIVENT RESPIMAT) 20-100 MCG/ACT AERS respimat Inhale 1 puff into the lungs every 6 (six) hours as needed for shortness of breath or wheezing.   Yes [provider]  melatonin 3 MG TABS tablet Take 3 mg by mouth at bedtime.   Yes [provider]  midodrine (PROAMATINE) 5 MG tablet Take 1 tablet (5 mg total) by mouth 2 (two) times daily with a meal. Her systolic blood pressure in the hospital is around 110 to 130 while taking midrodrine 10m bid, she may not need midodrine long term, pcp to continue to monitor 02/02/22  Yes XFlorencia Reasons MD  mirtazapine (REMERON) 7.5 MG tablet Take 1 tablet (7.5 mg total) by mouth at bedtime. 01/23/22  Yes SThurnell Lose MD  Multiple Vitamins-Minerals  (PRESERVISION AREDS 2) CAPS Take 1 capsule by mouth 2 (two) times daily with a meal.   Yes [provider]  omeprazole (PRILOSEC) 20 MG capsule Take 1 capsule (20 mg total) by mouth daily. Patient taking differently: Take 20 mg by mouth 2 (two) times daily. 06/19/19  Yes Palumbo, April, MD  risperiDONE (RISPERDAL) 1 MG tablet Take 1 tablet (1 mg total) by mouth at bedtime. 10/27/21  Yes Johnson, Clanford L, MD  polyethylene glycol (MIRALAX / GLYCOLAX) 17 g packet Take 17 g by mouth daily. Patient not taking: Reported on 03/09/2022 02/03/22   XFlorencia Reasons MD    Physical Exam: Vitals:   03/09/22 0830  03/09/22 0900 03/09/22 0930 03/09/22 1010  BP: 130/84 (!) 143/99 137/89   Pulse: 85 93 95   Resp: (!) 24 (!) 21 20   Temp:    98.1 F (36.7 C)  TempSrc:    Oral  SpO2: 98% 100% 98%   Weight:      Height:      GENERAL:  A&O x 3, NAD, well developed, cooperative, follows commands HEENT: [place]/AT, No thrush, No icterus, No oral ulcers Neck:  No neck mass, No meningismus, soft, supple CV: RRR, no S3, no S4, no rub, + JVD Lungs:  bibasilar crackles. No wheeze Abd: soft/NT +BS, nondistended Ext: 1 + LE edema, no lymphangitis, no cyanosis, no rashes Neuro:  CN II-XII intact, strength 4/5 in RUE, RLE, strength 4/5 LUE, LLE; sensation intact bilateral; no dysmetria; babinski equivocal  Data Reviewed: Data reviewed above in history  Assessment and Plan: Acute respiratory failure with hypoxia -Secondary to pulmonary edema -Stable on 4 L nasal cannula -Presented with tachypnea and oxygen saturation 84% on room air -Wean oxygen for saturation greater 90%  Acute on chronic HFrEF -03/06/2022 cardiac MRI--diffuse HK with LBBB, LVEF 23%, RVEF of 46%, study consistent with NICM -01/30/22 Echo EF 30-35%, G1DD, mild-mod MR -Start IV furosemide 20 mg IV twice daily -Accurate I's and O's -Daily weights -The patient's soft blood pressures and orthostatic hypotension has limited GDMT>> consult  cardiology -Echo  Paranoid schizophrenia -Continue amantadine, risperidone and valbenazine  CKD stage IIIa -Baseline creatinine 0.9-1.2 -Monitor with diuresis  COPD -Stable without exacerbation -Continue Breo with therapeutic interchange  H/o SIADH -treated with hypertonic saline at [facility] in  early October 2023 On fluids restriction 1.2liter  Monitor sodium level   Chronic hypotension Her systolic blood pressure in the hospital is around 110 to 130 while taking midrodrine 57m bid,   GERD -continue PPI  Obesity -BMI 32.66 -Lifestyle modification  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 Inpatient status is judged to be reasonable and necessary in order to provide the required intensity of service to ensure the patient's safety. The patient's presenting symptoms, physical exam findings, and initial radiographic and laboratory data in the context of their chronic comorbidities is felt to place them at high risk for further clinical deterioration. Furthermore, it is not anticipated that the patient will be medically stable for discharge from the hospital within 2 midnights of admission.   * I certify that at the point of admission it is my clinical judgment that the patient will require inpatient hospital care spanning beyond 2 midnights from the point of admission due to high intensity of service, high risk for further deterioration and high frequency of surveillance required.*  Author: DOrson Eva MD 03/09/2022 10:32 AM  For on call review [email or web address]

## 2022-03-09 NOTE — ED Notes (Signed)
Pt 84% on RA, does not wear o2 at home. Does have hx of CHF and COPD.

## 2022-03-09 NOTE — Telephone Encounter (Signed)
Per result note:  Cardiac MRI showed moderate pleural effusions, cystic liver lesions, moderately enlarged LV with severely reduced LV function 23%.  Findings consistent with nonischemic cardiomyopathy.  Please forward to PCP for evaluation of liver cysts.  Please refer to advanced heart failure

## 2022-03-09 NOTE — Hospital Course (Signed)
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  She also complained of some orthopnea type symptoms in the past few days.  She denies any fevers, chills, headache, chest pain, nausea, vomiting, diarrhea, abdominal pain, dysuria, hematuria, hematochezia, melena.  Notably, the patient was recently admitted to the hospital from 01/29/2022 to 02/02/2022 after mechanical fall resulting in subdural hematoma.  She was managed nonoperatively.  She was also treated for UTI.  Prior to that, she had a hospitalization from 01/14/2022 to 01/23/2022 secondary to hyponatremia which was felt to be due to SIADH and decompensated HFrEF.  The patient required hypertonic saline for short period of time and was diuresed.  Her sodium has ranged from 132-135. In the ED, the patient was afebrile and hemodynamically stable.  Oxygen saturation was 84% on room air.  She was placed on 4 L with saturation 96-97%.  BMP showed a sodium 132, potassium 4.3, bicarbonate 25, serum creatinine 1.02.  AST 22, ALT 22, alk phosphatase 96, total bilirubin 0.5.  WBC 7.2, hemoglobin 12.1, platelets 232.  Chest x-ray showed vascular congestion and small left pleural effusion.  BNP was 1059.  Troponin 32>> 29.  EKG shows sinus rhythm with left bundle branch block.  The patient was given furosemide 20 mg IV x 1.

## 2022-03-09 NOTE — Telephone Encounter (Signed)
Spoke to niece, okay per DPR, who verbalized understanding.

## 2022-03-09 NOTE — ED Provider Notes (Signed)
[facility] Emergency Department Provider Note MRN:  [id number]  Arrival date & time: 03/09/22     Chief Complaint   SOB  History of Present Illness   [name] is a 81 y.o. year-old female with a history of CHF presenting to the ED with chief complaint of shortness of breath.  Shortness of breath this evening, trouble sleeping due to the shortness of breath.  Also with some palpitations.  Denies chest pain, no headache or vision change, no abdominal pain, no recent leg pain or swelling, compliant with medications, denies any other symptoms.  Review of Systems  A thorough review of systems was obtained and all systems are negative except as noted in the HPI and PMH.   Patient's Health History    Past Medical History:  Diagnosis Date   Anemia 1990   hx of   Anxiety    Arthritis 2004   Asthma 1995   uses inhaler   Chronic kidney disease 2010   LEFT kidney removed   Colon polyp    Complication of anesthesia 1966   first surgery she had- had a hard time waking up from surgery   Delusional disorder ([place])    Six delusional disorder   Diverticulosis 2003   GERD (gastroesophageal reflux disease)    on meds   Schizophrenia ([place])    Severe left ventricular systolic dysfunction (LVSD)    EF 23% by cardiac MRI    Past Surgical History:  Procedure Laterality Date   APPENDECTOMY  1966   CATARACT EXTRACTION     Bilateral   CHOLECYSTECTOMY  2004   COLON SURGERY  2008   COLONOSCOPY  2013   JP-MAC-moviprep(good)-TA   COLOSTOMY     COLOSTOMY TAKEDOWN     DIAGNOSTIC LAPAROSCOPY     exploratory for endometriosis   HEEL SPUR SURGERY     Right, Metal Plate in [place]  2010   NEPHRECTOMY Left 2229   As complication of partial colectomy for diverticulitis   POLYPECTOMY  2013   TA   TONSILLECTOMY     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   Procedure: LEFT TOTAL KNEE ARTHROPLASTY;  Surgeon: Susa Day, MD;  Location: [facility]  ORS;  Service: Orthopedics;  Laterality: Left;  Adductor Block    Family History  Problem Relation Age of Onset   Hypertension Mother    Heart failure Mother    Hyperlipidemia Mother    Stroke Mother    Hypertension Father    Heart attack Father    COPD Father    Diabetes Paternal Aunt    Diabetes Paternal Grandmother    Colon polyps Other        niece   Stomach cancer Neg Hx    Rectal cancer Neg Hx    Esophageal cancer Neg Hx    Colon cancer Neg Hx     Social History   Socioeconomic History   Marital status: Legally Separated    Spouse name: Not on file   Number of children: 67   Years of education: Not on file   Highest education level: Not on file  Occupational History   Occupation: retired  Tobacco Use   Smoking status: Never   Smokeless tobacco: Never  Vaping Use   Vaping Use: Never used  Substance and Sexual Activity   Alcohol use: Not Currently    Comment: occassionally   Drug use: No   Sexual activity: Not Currently  Other Topics Concern  Not on file  Social History Narrative   Not on file   Social Determinants of Health   Financial Resource Strain: Not on file  Food Insecurity: No Food Insecurity (01/30/2022)   Hunger Vital Sign    Worried About Running Out of Food in the Last Year: Never true    Ran Out of Food in the Last Year: Never true  Transportation Needs: No Transportation Needs (01/30/2022)   PRAPARE - Hydrologist (Medical): No    Lack of Transportation (Non-Medical): No  Physical Activity: Not on file  Stress: Not on file  Social Connections: Not on file  Intimate Partner Violence: Not At Risk (01/30/2022)   Humiliation, Afraid, Rape, and Kick questionnaire    Fear of Current or Ex-Partner: No    Emotionally Abused: No    Physically Abused: No    Sexually Abused: No     Physical Exam   Vitals:   03/09/22 0630 03/09/22 0700  BP: 137/85 (!) 143/78  Pulse: 90 93  Resp: (!) 23 20  Temp:    SpO2:  93% 93%    CONSTITUTIONAL: Well-appearing, NAD NEURO/PSYCH:  Alert and oriented x 3, no focal deficits EYES:  eyes equal and reactive ENT/NECK:  no LAD, no JVD CARDIO: Regular rate, well-perfused, normal S1 and S2 PULM:  CTAB no wheezing or rhonchi GI/GU:  non-distended, non-tender MSK/SPINE:  No gross deformities, no edema SKIN:  no rash, atraumatic   *Additional and/or pertinent findings included in MDM below  Diagnostic and Interventional Summary    EKG Interpretation  Date/Time:  Friday March 09 2022 05:49:59 EST Ventricular Rate:  92 PR Interval:  183 QRS Duration: 147 QT Interval:  405 QTC Calculation: 502 R Axis:   35 Text Interpretation: Sinus rhythm Probable left atrial enlargement IVCD, consider atypical LBBB Confirmed by Gerlene Fee [phone]) on 03/09/2022 6:00:08 AM       Labs Reviewed  BRAIN NATRIURETIC PEPTIDE - Abnormal; Notable for the following components:      Result Value   B Natriuretic Peptide 1,059.0 (*)    All other components within normal limits  CBC  COMPREHENSIVE METABOLIC PANEL  TROPONIN I (HIGH SENSITIVITY)    DG Chest Port 1 View  Final Result      Medications  furosemide (LASIX) injection 20 mg (20 mg Intravenous Given 03/09/22 0651)     Procedures  /  Critical Care .Critical Care  Performed by: Maudie Flakes, MD Authorized by: Maudie Flakes, MD   Critical care provider statement:    Critical care time (minutes):  30   Critical care was necessary to treat or prevent imminent or life-threatening deterioration of the following conditions:  Respiratory failure   Critical care was time spent personally by me on the following activities:  Development of treatment plan with patient or surrogate, discussions with consultants, evaluation of patient's response to treatment, examination of patient, ordering and review of laboratory studies, ordering and review of radiographic studies, ordering and performing treatments and  interventions, pulse oximetry, re-evaluation of patient's condition and review of old charts   ED Course and Medical Decision Making  Initial Impression and Ddx Hypoxic respiratory failure, 84% on room air, requiring 4 L nasal cannula.  Differential diagnosis includes CHF exacerbation, PE, pneumonia, pneumothorax, atypical presentation of ACS.  Awaiting labs, chest x-ray.  Past medical/surgical history that increases complexity of ED encounter:  CHF  Interpretation of Diagnostics I personally reviewed the Chest Xray and my interpretation  is as follows:  edema noted  Labs reassuring with no significant blood count or electrolyte disturbance.  BNP elevated.  Patient Reassessment and Ultimate Disposition/Management     Plan is for medicine admission for CHF.  Awaiting labs.  Signed out to oncoming provider.  Patient management required discussion with the following services or consulting groups:  Hospitalist Service  Complexity of Problems Addressed Acute illness or injury that poses threat of life of bodily function  Additional Data Reviewed and Analyzed Further history obtained from: None  Additional Factors Impacting ED Encounter Risk Consideration of hospitalization  Barth Kirks. [place] Small, [place] [email or web address]  Final Clinical Impressions(s) / ED Diagnoses     ICD-10-CM   1. SOB (shortness of breath)  R06.02     2. Palpitations  R00.2     3. Hypoxia  R09.02       ED Discharge Orders     None        Discharge Instructions Discussed with and Provided to Patient:   Discharge Instructions   None      Maudie Flakes, MD 03/09/22 [phone]

## 2022-03-09 NOTE — Telephone Encounter (Signed)
Will fwd to provider for results.

## 2022-03-10 LAB — BASIC METABOLIC PANEL
Anion gap: 7 (ref 5–15)
BUN: 21 mg/dL (ref 8–23)
CO2: 28 mmol/L (ref 22–32)
Calcium: 8.4 mg/dL — ABNORMAL LOW (ref 8.9–10.3)
Chloride: 98 mmol/L (ref 98–111)
Creatinine, Ser: 1.02 mg/dL — ABNORMAL HIGH (ref 0.44–1.00)
GFR, Estimated: 56 mL/min — ABNORMAL LOW (ref >60)
Glucose, Bld: 94 mg/dL (ref 70–99)
Potassium: 4.2 mmol/L (ref 3.5–5.1)
Sodium: 133 mmol/L — ABNORMAL LOW (ref 135–145)

## 2022-03-10 LAB — GLUCOSE, CAPILLARY: Glucose-Capillary: 110 mg/dL — ABNORMAL HIGH (ref 70–99)

## 2022-03-10 LAB — MAGNESIUM: Magnesium: 1.9 mg/dL (ref 1.7–2.4)

## 2022-03-10 MED ORDER — FUROSEMIDE 20 MG PO TABS
20.0000 mg | ORAL_TABLET | Freq: Every day | ORAL | Status: DC
  Administered 2022-03-11 – 2022-03-12 (×2): 20 mg via ORAL
  Filled 2022-03-10 (×2): qty 1

## 2022-03-10 NOTE — Progress Notes (Addendum)
PROGRESS NOTE  [name] DJT:[id number] DOB: 19-Dec-1940 DOA: 03/09/2022 PCP: Lubertha Sayres, FNP  Brief History:  81 year old female with a history of HFrEF (EF 25%), SIADH, COPD, hypertension, paranoid schizophrenia and delusional disorder presenting with shortness of breath that woke her up in the early morning 03/09/2022.  She also complained of some orthopnea type symptoms in the past few days.  She denies any fevers, chills, headache, chest pain, nausea, vomiting, diarrhea, abdominal pain, dysuria, hematuria, hematochezia, melena.  Notably, the patient was recently admitted to the hospital from 01/29/2022 to 02/02/2022 after mechanical fall resulting in subdural hematoma.  She was managed nonoperatively.  She was also treated for UTI.  Prior to that, she had a hospitalization from 01/14/2022 to 01/23/2022 secondary to hyponatremia which was felt to be due to SIADH and decompensated HFrEF.  The patient required hypertonic saline for short period of time and was diuresed.  Her sodium has ranged from 132-135. In the ED, the patient was afebrile and hemodynamically stable.  Oxygen saturation was 84% on room air.  She was placed on 4 L with saturation 96-97%.  BMP showed a sodium 132, potassium 4.3, bicarbonate 25, serum creatinine 1.02.  AST 22, ALT 22, alk phosphatase 96, total bilirubin 0.5.  WBC 7.2, hemoglobin 12.1, platelets 232.  Chest x-ray showed vascular congestion and small left pleural effusion.  BNP was 1059.  Troponin 32>> 29.  EKG shows sinus rhythm with left bundle branch block.  The patient was given furosemide 20 mg IV x 1.  She was continued on IV lasix with stable renal function and clinical improvement.     Assessment/Plan:  Acute respiratory failure with hypoxia -Secondary to pulmonary edema -Stable on 4 L nasal cannula -Presented with tachypnea and oxygen saturation 84% on room air -Wean oxygen for saturation greater 90% -12/2--down to 2L with saturation 95-96%    Acute on chronic HFrEF -03/06/2022 cardiac MRI--diffuse HK with LBBB, LVEF 23%, RVEF of 46%, study consistent with NICM -01/30/22 Echo EF 30-35%, G1DD, mild-mod MR -continue IV furosemide 20 mg IV twice daily -Accurate I's and O's--NEG 2.5L -Daily weights -The patient's soft blood pressures and orthostatic hypotension has limited GDMT -remains clinically fluid overloaded -obtain ReDS reading   Paranoid schizophrenia -Continue amantadine, risperidone and valbenazine   CKD stage IIIa -Baseline creatinine 0.9-1.2 -Monitor with diuresis   COPD -Stable without exacerbation -Continue Breo with therapeutic interchange   H/o SIADH -treated with hypertonic saline at [facility] in  early October 2023 -On fluids restriction 1.2liter  Monitor sodium level   Chronic hypotension Her systolic blood pressure in the hospital is around 110 to 130 while taking midrodrine 42m bid,    GERD -continue PPI   Obesity -BMI 32.66 -Lifestyle modification   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  -due to CHF and SIADH -fluid restrict 1.2L      Family Communication:  niece updated 12/2  Consultants:  cardiology  Code Status:  FULL  DVT Prophylaxis: [place] Lovenox   Procedures: As Listed in Progress Note Above  Antibiotics: None     Subjective: Patient denies fevers, chills, headache, chest pain, dyspnea, nausea, vomiting, diarrhea, abdominal pain, dysuria, hematuria, hematochezia, and melena.   Objective: Vitals:   03/10/22 0549 03/10/22 0807 03/10/22 0900 03/10/22 1400  BP: 111/72  (!) 112/52   Pulse: 96  79   Resp:      Temp:      TempSrc:      SpO2:  93% 92%   Weight:    81.2 kg  Height:        Intake/Output Summary (Last 24 hours) at 03/10/2022 1420 Last data filed at 03/10/2022 1000 Gross per 24 hour  Intake 240 ml  Output 3650 ml  Net -3410 ml   Weight change: 0.6 kg Exam:  General:  Pt is alert, follows commands appropriately, not in acute distress HEENT: No  icterus, No thrush, No neck mass, [place]/AT Cardiovascular: RRR, S1/S2, no rubs, no gallops Respiratory: bibasilar crackles. No wheeze Abdomen: Soft/+BS, non tender, non distended, no guarding Extremities: 1 + LE edema, No lymphangitis, No petechiae, No rashes, no synovitis   Data Reviewed: I have personally reviewed following labs and imaging studies Basic Metabolic Panel: Recent Labs  Lab 03/09/22 0555 03/10/22 0532  NA 132* 133*  K 4.3 4.2  CL 98 98  CO2 25 28  GLUCOSE 97 94  BUN 19 21  CREATININE 1.02* 1.02*  CALCIUM 8.5* 8.4*  MG  --  1.9   Liver Function Tests: Recent Labs  Lab 03/09/22 0555  AST 22  ALT 22  ALKPHOS 96  BILITOT 0.5  PROT 5.9*  ALBUMIN 3.6   No results for input(s): "LIPASE", "AMYLASE" in the last 168 hours. No results for input(s): "AMMONIA" in the last 168 hours. Coagulation Profile: No results for input(s): "INR", "PROTIME" in the last 168 hours. CBC: Recent Labs  Lab 03/09/22 0555  WBC 7.2  HGB 12.1  HCT 38.3  MCV 93.6  PLT 232   Cardiac Enzymes: No results for input(s): "CKTOTAL", "CKMB", "CKMBINDEX", "TROPONINI" in the last 168 hours. BNP: Invalid input(s): "POCBNP" CBG: No results for input(s): "GLUCAP" in the last 168 hours. HbA1C: No results for input(s): "HGBA1C" in the last 72 hours. Urine analysis:    Component Value Date/Time   COLORURINE COLORLESS (A) 03/09/2022 0956   APPEARANCEUR CLEAR 03/09/2022 0956   APPEARANCEUR Clear 12/13/2021 0957   LABSPEC 1.005 03/09/2022 0956   PHURINE 5.0 03/09/2022 0956   GLUCOSEU NEGATIVE 03/09/2022 0956   HGBUR NEGATIVE 03/09/2022 0956   BILIRUBINUR NEGATIVE 03/09/2022 0956   BILIRUBINUR Negative 12/13/2021 0957   KETONESUR NEGATIVE 03/09/2022 0956   PROTEINUR NEGATIVE 03/09/2022 0956   UROBILINOGEN 0.2 10/29/2016 1541   UROBILINOGEN 0.2 07/21/2007 2218   NITRITE NEGATIVE 03/09/2022 0956   LEUKOCYTESUR TRACE (A) 03/09/2022 0956   Sepsis  Labs: _0 (procalcitonin:4,lacticidven:4) ) Recent Results (from the past 240 hour(s))  SARS Coronavirus 2 by RT PCR (hospital order, performed in [place] hospital lab) *cepheid single result test* Anterior Nasal Swab     Status: None   Collection Time: 03/09/22  1:30 PM   Specimen: Anterior Nasal Swab  Result Value Ref Range Status   SARS Coronavirus 2 by RT PCR NEGATIVE NEGATIVE Final    Comment: (NOTE) SARS-CoV-2 target nucleic acids are NOT DETECTED.  The SARS-CoV-2 RNA is generally detectable in upper and lower respiratory specimens during the acute phase of infection. The lowest concentration of SARS-CoV-2 viral copies this assay can detect is 250 copies / mL. A negative result does not preclude SARS-CoV-2 infection and should not be used as the sole basis for treatment or other patient management decisions.  A negative result may occur with improper specimen collection / handling, submission of specimen other than nasopharyngeal swab, presence of viral mutation(s) within the areas targeted by this assay, and inadequate number of viral copies (<250 copies / mL). A negative result must be combined with clinical observations, patient history, and  epidemiological information.  Fact Sheet for Patients:   https://www.patel.info/  Fact Sheet for Healthcare Providers: https://hall.com/  This test is not yet approved or  cleared by the [place] FDA and has been authorized for detection and/or diagnosis of SARS-CoV-2 by FDA under an Emergency Use Authorization (EUA).  This EUA will remain in effect (meaning this test can be used) for the duration of the COVID-19 declaration under Section 564(b)(1) of the Act, 21 U.S.C. section 360bbb-3(b)(1), unless the authorization is terminated or revoked sooner.  Performed at [facility], [address] [place], [place] [address]      Scheduled Meds:  amantadine  100 mg Oral BID    calcium-vitamin D  1 tablet Oral Q breakfast   enoxaparin (LOVENOX) injection  40 mg Subcutaneous Q24H   fluticasone  2 spray Each Nare Daily   fluticasone furoate-vilanterol  1 puff Inhalation Daily   furosemide  20 mg Intravenous BID   loratadine  10 mg Oral Daily   melatonin  3 mg Oral QHS   midodrine  5 mg Oral BID WC   mirtazapine  7.5 mg Oral QHS   multivitamin-lutein  1 capsule Oral BID WC   pantoprazole  40 mg Oral Daily   risperiDONE  1 mg Oral QHS   sodium chloride flush  3 mL Intravenous Q12H   Continuous Infusions:  sodium chloride      Procedures/Studies: DG Chest Port 1 View  Result Date: 03/09/2022 CLINICAL DATA:  Shortness of breath EXAM: PORTABLE CHEST 1 VIEW COMPARISON:  01/31/2022 FINDINGS: Cardiomegaly and congested appearance of vessels. Dense opacity at the left base primarily attributed to pleural fluid based on abdominal CT 02/01/2022. No pneumothorax. IMPRESSION: CHF pattern with small left pleural effusion. Electronically Signed   By: Jorje Guild M.D.   On: 03/09/2022 06:25   MR CARDIAC VELOCITY FLOW MAP  Result Date: 03/06/2022 : Reported with cardiac MRI, see separate report. Electronically Signed   By: Loralie Champagne M.D.   On: 03/06/2022 16:21   MR CARDIAC VELOCITY FLOW MAP  Result Date: 03/06/2022 : Reported with cardiac MRI, see separate report. Electronically Signed   By: Loralie Champagne M.D.   On: 03/06/2022 16:21   MR CARDIAC MORPHOLOGY W WO CONTRAST  Result Date: 03/06/2022 CLINICAL DATA:  Cardiomyopathy of uncertain etiology EXAM: CARDIAC MRI TECHNIQUE: The patient was scanned on a 1.5 Tesla GE magnet. A dedicated cardiac coil was used. Functional imaging was done using Fiesta sequences. 2,3, and 4 chamber views were done to assess for RWMA's. Modified Simpson's rule using a short axis stack was used to calculate an ejection fraction on a dedicated work Conservation officer, nature. The patient received 11 cc of Gadavist. After 10 minutes  inversion recovery sequences were used to assess for infiltration and scar tissue. CONTRAST:  Gadavist 11 cc FINDINGS: Several cystic liver lesions noted, largest 3.7 cm in diameter. Moderate bilateral pleural effusions. Trivial pericardial effusion. Moderately dilated left ventricle with normal wall thickness. Septal-lateral dyssynchrony consistent with LBBB, global hypokinesis with EF 23%. Normal right ventricular size with mildly reduced systolic function, EF 06%. Mild left atrial enlargement, normal right atrial size. Visually, there is at least mild mitral regurgitation. By flow analysis, there is no significant mitral regurgitation. Trileaflet aortic valve, no signficant stenosis, trivial aortic insufficiency. On delayed enhancement imaging, there was minimal subepicardial late gadolinium enhancement (LGE) at the basal inferoseptal RV insertion site. MEASUREMENTS: MEASUREMENTS LVEDV 273 mL LVEDVi 145 mL/m2 LVSV 62 mL LVEF 23% RVEDV  125 mL RVEDVi 67 mL/m2 RVSV 58 mL RVEF 46% T1 1083, ECV 30% Aortic forward volume 62 mL Aortic regurgitant fraction 9% Pulmonary forward volume 63 mL Pulmonary regurgitant fraction 2% IMPRESSION: 1.  Moderate bilateral pleural effusions. 2.  Cystic liver lesions noted as above. 3. Moderately dilated LV, diffuse hypokinesis with septal-lateral dyssynchrony consistent with LBBB; EF 23%. 4.  Normal RV size with EF 46%. 5. Nonspecific RV insertion site LGE, this can be seen with pressure/volume overload. 6. Borderline elevated extracellular volume percentage, suggesting some replacement fibrosis. 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 [name] Electronically Signed   By: Loralie Champagne M.D.   On: 03/06/2022 16:21    Orson Eva, DO  [facility]  If 7PM-7AM, please contact night-coverage [email or web address] Password [email or web address] 03/10/2022, 2:20 PM   LOS: 1 day

## 2022-03-10 NOTE — Progress Notes (Signed)
   03/10/22 1400  ReDS Vest / Clip  BMI (Calculated) 32.73  Station Marker A  Ruler Value 32  ReDS Value Range < 36  ReDS Actual Value 27

## 2022-03-11 LAB — BASIC METABOLIC PANEL
Anion gap: 10 (ref 5–15)
BUN: 25 mg/dL — ABNORMAL HIGH (ref 8–23)
CO2: 25 mmol/L (ref 22–32)
Calcium: 8.4 mg/dL — ABNORMAL LOW (ref 8.9–10.3)
Chloride: 96 mmol/L — ABNORMAL LOW (ref 98–111)
Creatinine, Ser: 1.17 mg/dL — ABNORMAL HIGH (ref 0.44–1.00)
GFR, Estimated: 47 mL/min — ABNORMAL LOW (ref >60)
Glucose, Bld: 91 mg/dL (ref 70–99)
Potassium: 4 mmol/L (ref 3.5–5.1)
Sodium: 131 mmol/L — ABNORMAL LOW (ref 135–145)

## 2022-03-11 MED ORDER — MENTHOL 3 MG MT LOZG: 1.0000 | LOZENGE | OROMUCOSAL | Status: DC | PRN

## 2022-03-11 NOTE — TOC Initial Note (Signed)
Transition of Care [facility]) - Initial/Assessment Note    Patient Details  Name: [name] MRN: [id number] Date of Birth: 12/27/1940  Transition of Care [facility]) CM/SW Contact:    Iona Beard, [place] Phone Number: 03/11/2022, 11:45 AM  Clinical Narrative:                 Pt admitted from [facility]. CSW attempted to reach facility, no answer. CSW reached out to pts niece/legal guardian and left HIPAA compliant VM requesting return call. Fl2 has been started. TOC to follow.   Expected Discharge Plan: Assisted Living Barriers to Discharge: Continued Medical Work up   Patient Goals and CMS Choice Patient states their goals for this hospitalization and ongoing recovery are:: get better CMS Medicare.gov Compare Post Acute Care list provided to:: Patient Represenative (must comment) Choice offered to / list presented to : [facility] POA / Guardian (niece)  Expected Discharge Plan and Services Expected Discharge Plan: Assisted Living In-house Referral: Clinical Social Work Discharge Planning Services: CM Consult   Living arrangements for the past 2 months: [place]                                      Prior Living Arrangements/Services Living arrangements for the past 2 months: [place] Lives with:: Facility Resident Patient language and need for interpreter reviewed:: Yes Do you feel safe going back to the place where you live?: Yes      Need for Family Participation in Patient Care: Yes (Comment) Care giver support system in place?: Yes (comment)   Criminal Activity/Legal Involvement Pertinent to Current Situation/Hospitalization: No - Comment as needed  Activities of Daily Living Home Assistive Devices/Equipment: Walker (specify type) ADL Screening (condition at time of admission) Patient's cognitive ability adequate to safely complete daily activities?: Yes Is the patient deaf or have difficulty hearing?: No Does the patient have  difficulty seeing, even when wearing glasses/contacts?: No Does the patient have difficulty concentrating, remembering, or making decisions?: No Patient able to express need for assistance with ADLs?: Yes Does the patient have difficulty dressing or bathing?: No Independently performs ADLs?: Yes (appropriate for developmental age) Does the patient have difficulty walking or climbing stairs?: No Weakness of Legs: None Weakness of Arms/Hands: None  Permission Sought/Granted                  Emotional Assessment         Alcohol / Substance Use: Not Applicable Psych Involvement: No (comment)  Admission diagnosis:  Palpitations [R00.2] SOB (shortness of breath) [R06.02] Hypoxia [R09.02] Acute on chronic systolic CHF (congestive heart failure) (HCC) [N62.95] Systolic congestive heart failure, unspecified HF chronicity ([place]) [I50.20] Patient Active Problem List   Diagnosis Date Noted   Acute on chronic systolic CHF (congestive heart failure) ([place]) 03/09/2022   Acute respiratory failure with hypoxia ([place]) 03/09/2022   Chronic kidney disease, stage 3a ([place]) 03/09/2022   Severe left ventricular systolic dysfunction (LVSD) 03/07/2022   SDH (subdural hematoma) ([place]) 01/30/2022   Subdural hematoma ([place]) 01/29/2022   Fall at home, initial encounter 01/29/2022   Leukocytosis 01/29/2022   SIADH (syndrome of inappropriate ADH production) ([place]) 28/41/3244   Systolic heart failure ([place]) 01/16/2022   CKD (chronic kidney disease), stage II 01/16/2022   Hyponatremia 01/15/2022   Pressure injury of skin 01/15/2022   hyperthyroidism  04/11/7251   Acute metabolic encephalopathy - RESOLVED 10/24/2021  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   Elevated troponin 10/24/2021   Pain due to onychomycosis of toenails of both feet 09/12/2020   Chronic low back pain without sciatica 10/02/2017   Essential hypertension 10/02/2017   Paranoid schizophrenia ([place]) 09/19/2017   Delusional disorder, 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[place])  09/18/2017   Behavioral change 09/18/2017   Severe manic bipolar 1 disorder with psychotic behavior ([place]) 08/01/2017   History of left inguinal hernia repair 08/06/2016   H/O total knee replacement, left 06/19/2016   Primary osteoarthritis of left knee 04/04/2016   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/2016   Anxiety 11/15/2015   Gastroesophageal reflux disease without esophagitis 11/15/2015   Solitary right kidney 07/07/2015   Urge incontinence of urine 07/07/2015   Asthma 12/07/2011   Arthritis 12/07/2011   Thoracic spondylosis - mild to moderate 2009 12/07/2011   Obesity  12/07/2011   Incisional hernia 03/29/2011   DIVERTICULOSIS-COLON 12/01/2008   ABDOMINAL PAIN-RLQ 12/01/2008   PCP:  Lubertha Sayres, FNP Pharmacy:   [id number] [place]Phone: [phone]: [phone] [address]Phone: [phone]: [phone] [facility] [address]Phone: [phone]: [phone]    Social Determinants of Health (SDOH) Interventions    Readmission Risk Interventions     No data to display

## 2022-03-11 NOTE — Progress Notes (Signed)
PROGRESS NOTE  WM FRUCHTER EYC:[id number] DOB: 1940-11-13 DOA: 03/09/2022 PCP: Lubertha Sayres, FNP  Brief History:  81 year old female with a history of HFrEF (EF 25%), SIADH, COPD, hypertension, paranoid schizophrenia and delusional disorder presenting with shortness of breath that woke her up in the early morning 03/09/2022.  She also complained of some orthopnea type symptoms in the past few days.  She denies any fevers, chills, headache, chest pain, nausea, vomiting, diarrhea, abdominal pain, dysuria, hematuria, hematochezia, melena.  Notably, the patient was recently admitted to the hospital from 01/29/2022 to 02/02/2022 after mechanical fall resulting in subdural hematoma.  She was managed nonoperatively.  She was also treated for UTI.  Prior to that, she had a hospitalization from 01/14/2022 to 01/23/2022 secondary to hyponatremia which was felt to be due to SIADH and decompensated HFrEF.  The patient required hypertonic saline for short period of time and was diuresed.  Her sodium has ranged from 132-135. In the ED, the patient was afebrile and hemodynamically stable.  Oxygen saturation was 84% on room air.  She was placed on 4 L with saturation 96-97%.  BMP showed a sodium 132, potassium 4.3, bicarbonate 25, serum creatinine 1.02.  AST 22, ALT 22, alk phosphatase 96, total bilirubin 0.5.  WBC 7.2, hemoglobin 12.1, platelets 232.  Chest x-ray showed vascular congestion and small left pleural effusion.  BNP was 1059.  Troponin 32>> 29.  EKG shows sinus rhythm with left bundle branch block.  The patient was given furosemide 20 mg IV x 1.  She was continued on IV lasix with stable renal function and clinical improvement.     Assessment/Plan:  Acute respiratory failure with hypoxia -Secondary to pulmonary edema -Initially on 4 L nasal cannula -Presented with tachypnea and oxygen saturation 84% on room air -Wean oxygen for saturation greater 90% -12/2--down to 2L with saturation  95-96% -12/3--weaned to RA with saturation 94-95%   Acute on chronic HFrEF -03/06/2022 cardiac MRI--diffuse HK with LBBB, LVEF 23%, RVEF of 46%, study consistent with NICM -01/30/22 Echo EF 30-35%, G1DD, mild-mod MR -continue IV furosemide 20 mg IV twice daily -Accurate I's and O's--NEG 3.8L -Daily weights>>back to 179 lbs -The patient's soft blood pressures and orthostatic hypotension has limited GDMT -remains clinically fluid overloaded -obtain ReDS reading--27%   Paranoid schizophrenia -Continue amantadine, risperidone and valbenazine   CKD stage IIIa -Baseline creatinine 0.9-1.2 -Monitor with diuresis   COPD -Stable without exacerbation -Continue Breo with therapeutic interchange   H/o SIADH -treated with hypertonic saline at [place] in  early October 2023 -On fluids restriction 1.2liter  Monitor sodium level   Chronic hypotension Her systolic blood pressure in the hospital is around 110 to 130 while taking midrodrine 49m bid,    GERD -continue PPI   Obesity -BMI 32.66 -Lifestyle modification   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  -due to CHF and SIADH -fluid restrict 1.2L         Family Communication:  niece updated 12/3   Consultants:  cardiology   Code Status:  FULL   DVT Prophylaxis: [place] Lovenox     Procedures: As Listed in Progress Note Above   Antibiotics: None       Subjective: Patient denies fevers, chills, headache, chest pain, dyspnea, nausea, vomiting, diarrhea, abdominal pain, dysuria, hematuria, hematochezia, and melena. She complains of dry mouth and sore throat   Objective: Vitals:   03/10/22 2127 03/11/22 0551 03/11/22 0800 03/11/22 1031  BP: 110/61 124/83 132/86  Pulse: 84 87    Resp: 20 20    Temp: (!) 97.2 F (36.2 C) (!) 97.3 F (36.3 C)    TempSrc: Oral Oral    SpO2: (!) 89% 95%  96%  Weight:      Height:        Intake/Output Summary (Last 24 hours) at 03/11/2022 1323 Last data filed at 03/11/2022 1026 Gross  per 24 hour  Intake 720 ml  Output 1603 ml  Net -883 ml   Weight change: -0.406 kg Exam:  General:  Pt is alert, follows commands appropriately, not in acute distress HEENT: No icterus, No thrush, No neck mass, [place]/AT Cardiovascular: RRR, S1/S2, no rubs, no gallops Respiratory: CTA bilaterally, no wheezing, no crackles, no rhonchi Abdomen: Soft/+BS, non tender, non distended, no guarding Extremities: No edema, No lymphangitis, No petechiae, No rashes, no synovitis   Data Reviewed: I have personally reviewed following labs and imaging studies Basic Metabolic Panel: Recent Labs  Lab 03/09/22 0555 03/10/22 0532 03/11/22 0456  NA 132* 133* 131*  K 4.3 4.2 4.0  CL 98 98 96*  CO2 25 28 25  GLUCOSE 97 94 91  BUN 19 21 25*  CREATININE 1.02* 1.02* 1.17*  CALCIUM 8.5* 8.4* 8.4*  MG  --  1.9  --    Liver Function Tests: Recent Labs  Lab 03/09/22 0555  AST 22  ALT 22  ALKPHOS 96  BILITOT 0.5  PROT 5.9*  ALBUMIN 3.6   No results for input(s): "LIPASE", "AMYLASE" in the last 168 hours. No results for input(s): "AMMONIA" in the last 168 hours. Coagulation Profile: No results for input(s): "INR", "PROTIME" in the last 168 hours. CBC: Recent Labs  Lab 03/09/22 0555  WBC 7.2  HGB 12.1  HCT 38.3  MCV 93.6  PLT 232   Cardiac Enzymes: No results for input(s): "CKTOTAL", "CKMB", "CKMBINDEX", "TROPONINI" in the last 168 hours. BNP: Invalid input(s): "POCBNP" CBG: Recent Labs  Lab 03/10/22 2129  GLUCAP 110*   HbA1C: No results for input(s): "HGBA1C" in the last 72 hours. Urine analysis:    Component Value Date/Time   COLORURINE COLORLESS (A) 03/09/2022 0956   APPEARANCEUR CLEAR 03/09/2022 0956   APPEARANCEUR Clear 12/13/2021 0957   LABSPEC 1.005 03/09/2022 0956   PHURINE 5.0 03/09/2022 0956   GLUCOSEU NEGATIVE 03/09/2022 0956   HGBUR NEGATIVE 03/09/2022 0956   BILIRUBINUR NEGATIVE 03/09/2022 0956   BILIRUBINUR Negative 12/13/2021 0957   KETONESUR NEGATIVE  03/09/2022 0956   PROTEINUR NEGATIVE 03/09/2022 0956   UROBILINOGEN 0.2 10/29/2016 1541   UROBILINOGEN 0.2 07/21/2007 2218   NITRITE NEGATIVE 03/09/2022 0956   LEUKOCYTESUR TRACE (A) 03/09/2022 0956   Sepsis Labs: @LABRCNTIP(procalcitonin:4,lacticidven:4) ) Recent Results (from the past 240 hour(s))  SARS Coronavirus 2 by RT PCR (hospital order, performed in  hospital lab) *cepheid single result test* Anterior Nasal Swab     Status: None   Collection Time: 03/09/22  1:30 PM   Specimen: Anterior Nasal Swab  Result Value Ref Range Status   SARS Coronavirus 2 by RT PCR NEGATIVE NEGATIVE Final    Comment: (NOTE) SARS-CoV-2 target nucleic acids are NOT DETECTED.  The SARS-CoV-2 RNA is generally detectable in upper and lower respiratory specimens during the acute phase of infection. The lowest concentration of SARS-CoV-2 viral copies this assay can detect is 250 copies / mL. A negative result does not preclude SARS-CoV-2 infection and should not be used as the sole basis for treatment or other patient management decisions.  A   negative result may occur with improper specimen collection / handling, submission of specimen other than nasopharyngeal swab, presence of viral mutation(s) within the areas targeted by this assay, and inadequate number of viral copies (<250 copies / mL). A negative result must be combined with clinical observations, patient history, and epidemiological information.  Fact Sheet for Patients:   https://www.fda.gov/media/158405/download  Fact Sheet for Healthcare Providers: https://www.fda.gov/media/158404/download  This test is not yet approved or  cleared by the [place] FDA and has been authorized for detection and/or diagnosis of SARS-CoV-2 by FDA under an Emergency Use Authorization (EUA).  This EUA will remain in effect (meaning this test can be used) for the duration of the COVID-19 declaration under Section 564(b)(1) of the Act, 21  U.S.C. section 360bbb-3(b)(1), unless the authorization is terminated or revoked sooner.  Performed at [facility], [address] [place], [place] [address]      Scheduled Meds:  amantadine  100 mg Oral BID   calcium-vitamin D  1 tablet Oral Q breakfast   enoxaparin (LOVENOX) injection  40 mg Subcutaneous Q24H   fluticasone  2 spray Each Nare Daily   fluticasone furoate-vilanterol  1 puff Inhalation Daily   furosemide  20 mg Oral Daily   loratadine  10 mg Oral Daily   melatonin  3 mg Oral QHS   midodrine  5 mg Oral BID WC   mirtazapine  7.5 mg Oral QHS   multivitamin-lutein  1 capsule Oral BID WC   pantoprazole  40 mg Oral Daily   risperiDONE  1 mg Oral QHS   sodium chloride flush  3 mL Intravenous Q12H   Continuous Infusions:  sodium chloride      Procedures/Studies: DG Chest Port 1 View  Result Date: 03/09/2022 CLINICAL DATA:  Shortness of breath EXAM: PORTABLE CHEST 1 VIEW COMPARISON:  01/31/2022 FINDINGS: Cardiomegaly and congested appearance of vessels. Dense opacity at the left base primarily attributed to pleural fluid based on abdominal CT 02/01/2022. No pneumothorax. IMPRESSION: CHF pattern with small left pleural effusion. Electronically Signed   By: Jonathan  Watts M.D.   On: 03/09/2022 06:25   MR CARDIAC VELOCITY FLOW MAP  Result Date: 03/06/2022 : Reported with cardiac MRI, see separate report. Electronically Signed   By: Dalton  Mclean M.D.   On: 03/06/2022 16:21   MR CARDIAC VELOCITY FLOW MAP  Result Date: 03/06/2022 : Reported with cardiac MRI, see separate report. Electronically Signed   By: Dalton  Mclean M.D.   On: 03/06/2022 16:21   MR CARDIAC MORPHOLOGY W WO CONTRAST  Result Date: 03/06/2022 CLINICAL DATA:  Cardiomyopathy of uncertain etiology EXAM: CARDIAC MRI TECHNIQUE: The patient was scanned on a 1.5 Tesla GE magnet. A dedicated cardiac coil was used. Functional imaging was done using Fiesta sequences. 2,3, and 4 chamber views were done to  assess for RWMA's. Modified Simpson's rule using a short axis stack was used to calculate an ejection fraction on a dedicated work station using Circle software. The patient received 11 cc of Gadavist. After 10 minutes inversion recovery sequences were used to assess for infiltration and scar tissue. CONTRAST:  Gadavist 11 cc FINDINGS: Several cystic liver lesions noted, largest 3.7 cm in diameter. Moderate bilateral pleural effusions. Trivial pericardial effusion. Moderately dilated left ventricle with normal wall thickness. Septal-lateral dyssynchrony consistent with LBBB, global hypokinesis with EF 23%. Normal right ventricular size with mildly reduced systolic function, EF 46%. Mild left atrial enlargement, normal right atrial size. Visually, there is at least mild mitral regurgitation. By   flow analysis, there is no significant mitral regurgitation. Trileaflet aortic valve, no signficant stenosis, trivial aortic insufficiency. On delayed enhancement imaging, there was minimal subepicardial late gadolinium enhancement (LGE) at the basal inferoseptal RV insertion site. MEASUREMENTS: MEASUREMENTS LVEDV 273 mL LVEDVi 145 mL/m2 LVSV 62 mL LVEF 23% RVEDV 125 mL RVEDVi 67 mL/m2 RVSV 58 mL RVEF 46% T1 1083, ECV 30% Aortic forward volume 62 mL Aortic regurgitant fraction 9% Pulmonary forward volume 63 mL Pulmonary regurgitant fraction 2% IMPRESSION: 1.  Moderate bilateral pleural effusions. 2.  Cystic liver lesions noted as above. 3. Moderately dilated LV, diffuse hypokinesis with septal-lateral dyssynchrony consistent with LBBB; EF 23%. 4.  Normal RV size with EF 46%. 5. Nonspecific RV insertion site LGE, this can be seen with pressure/volume overload. 6. Borderline elevated extracellular volume percentage, suggesting some replacement fibrosis. 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 [name] Electronically Signed   By: Loralie Champagne M.D.   On: 03/06/2022 16:21    Orson Eva, DO  [facility]  If 7PM-7AM, please contact night-coverage [email or web address] Password [email or web address] 03/11/2022, 1:23 PM   LOS: 2 days

## 2022-03-12 ENCOUNTER — Ambulatory Visit: Payer: Medicare Other | Admitting: Physician Assistant

## 2022-03-12 LAB — BASIC METABOLIC PANEL
Anion gap: 8 (ref 5–15)
BUN: 22 mg/dL (ref 8–23)
CO2: 27 mmol/L (ref 22–32)
Calcium: 8.1 mg/dL — ABNORMAL LOW (ref 8.9–10.3)
Chloride: 97 mmol/L — ABNORMAL LOW (ref 98–111)
Creatinine, Ser: 0.98 mg/dL (ref 0.44–1.00)
GFR, Estimated: 58 mL/min — ABNORMAL LOW (ref >60)
Glucose, Bld: 80 mg/dL (ref 70–99)
Potassium: 4.1 mmol/L (ref 3.5–5.1)
Sodium: 132 mmol/L — ABNORMAL LOW (ref 135–145)

## 2022-03-12 MED ORDER — KETOROLAC TROMETHAMINE 15 MG/ML IJ SOLN
15.0000 mg | Freq: Once | INTRAMUSCULAR | Status: AC
  Administered 2022-03-12: 15 mg via INTRAVENOUS
  Filled 2022-03-12: qty 1

## 2022-03-12 MED ORDER — FUROSEMIDE 20 MG PO TABS: 20.0000 mg | ORAL_TABLET | Freq: Every day | ORAL | 1 refills | Status: AC

## 2022-03-12 NOTE — NC FL2 (Signed)
[place] LEVEL OF CARE FORM     IDENTIFICATION  Patient Name: [name] Birthdate: Dec 04, 1940 Sex: female Admission Date (Current Location): 03/09/2022  [facility] and [place] Number:  [employer] and Address:  [place] [address], [place]      Provider Number: [id number]  Attending Physician Name and Address:  Orson Eva, MD  Relative Name and Phone Number:       Current Level of Care: Hospital Recommended Level of Care: [place] Prior Approval Number:    Date Approved/Denied:   PASRR Number:    Discharge Plan: Other (Comment) (Assisted Living)    Current Diagnoses: Patient Active Problem List   Diagnosis Date Noted   Acute on chronic systolic CHF (congestive heart failure) ([place]) 03/09/2022   Acute respiratory failure with hypoxia ([place]) 03/09/2022   Chronic kidney disease, stage 3a ([place]) 03/09/2022   Severe left ventricular systolic dysfunction (LVSD) 03/07/2022   SDH (subdural hematoma) ([place]) 01/30/2022   Subdural hematoma ([place]) 01/29/2022   Fall at home, initial encounter 01/29/2022   Leukocytosis 01/29/2022   SIADH (syndrome of inappropriate ADH production) ([place]) 13/11/6576   Systolic heart failure ([place]) 01/16/2022   CKD (chronic kidney disease), stage II 01/16/2022   Hyponatremia 01/15/2022   Pressure injury of skin 01/15/2022   hyperthyroidism  46/96/2952   Acute metabolic encephalopathy - RESOLVED 10/24/2021   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   Elevated troponin 10/24/2021   Pain due to onychomycosis of toenails of both feet 09/12/2020   Chronic low back pain without sciatica 10/02/2017   Essential hypertension 10/02/2017   Paranoid schizophrenia ([place]) 09/19/2017   Delusional disorder, 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[place]) 09/18/2017   Behavioral change 09/18/2017   Severe manic bipolar 1 disorder with psychotic behavior ([place]) 08/01/2017   History of left inguinal hernia repair 08/06/2016   H/O total  knee replacement, left 06/19/2016   Primary osteoarthritis of left knee 04/04/2016   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/2016   Anxiety 11/15/2015   Gastroesophageal reflux disease without esophagitis 11/15/2015   Solitary right kidney 07/07/2015   Urge incontinence of urine 07/07/2015   Asthma 12/07/2011   Arthritis 12/07/2011   Thoracic spondylosis - mild to moderate 2009 12/07/2011   Obesity  12/07/2011   Incisional hernia 03/29/2011   DIVERTICULOSIS-COLON 12/01/2008   ABDOMINAL PAIN-RLQ 12/01/2008    Orientation RESPIRATION BLADDER Height & Weight     Self, Time, Situation, Place  O2 (2L) Incontinent Weight: 180 lb 12.4 oz (82 kg) Height:  '5\' 2"'$  (157.5 cm)  BEHAVIORAL SYMPTOMS/MOOD NEUROLOGICAL BOWEL NUTRITION STATUS      Continent Diet (Heart healthy)  AMBULATORY STATUS COMMUNICATION OF NEEDS Skin     Verbally Normal                       Personal Care Assistance Level of Assistance  Bathing, Feeding, Dressing Bathing Assistance: Limited assistance Feeding assistance: Independent Dressing Assistance: Limited assistance Total Care Assistance: Limited assistance   Functional Limitations Info  Sight, Hearing, Speech Sight Info: Impaired Hearing Info: Adequate Speech Info: Adequate    SPECIAL CARE FACTORS FREQUENCY        PT Frequency: 3x/week OT Frequency: 3x/week            Contractures Contractures Info: Not present    Additional Factors Info  Code Status, Allergies Code Status Info: FULL Allergies Info: Haloperidol, Aripiprazole, Chlorpheniramine-pse-ibuprofen           Current Medications (03/12/2022):  This is the current hospital active medication list Current Facility-Administered Medications  Medication Dose Route Frequency Provider Last Rate Last Admin   0.9 %  sodium chloride infusion  250 mL Intravenous PRN Tat, Shanon Brow, MD       acetaminophen (TYLENOL) tablet 650 mg  650 mg Oral Q4H PRN Tat, Shanon Brow, MD   650 mg at 03/12/22 1056   amantadine  (SYMMETREL) capsule 100 mg  100 mg Oral BID Orson Eva, MD   100 mg at 03/12/22 6754   calcium-vitamin D (OSCAL WITH D) 500-5 MG-MCG per tablet 1 tablet  1 tablet Oral Q breakfast Tat, David, MD   1 tablet at 03/12/22 0831   enoxaparin (LOVENOX) injection 40 mg  40 mg Subcutaneous Q24H Tat, Shanon Brow, MD   40 mg at 03/11/22 1204   fluticasone (FLONASE) 50 MCG/ACT nasal spray 2 spray  2 spray Each Nare Daily Tat, Shanon Brow, MD   2 spray at 03/12/22 0831   fluticasone furoate-vilanterol (BREO ELLIPTA) 200-25 MCG/ACT 1 puff  1 puff Inhalation Daily Tat, David, MD   1 puff at 03/12/22 0730   furosemide (LASIX) tablet 20 mg  20 mg Oral Daily Tat, Shanon Brow, MD   20 mg at 03/12/22 4920   guaiFENesin-dextromethorphan (ROBITUSSIN DM) 100-10 MG/5ML syrup 5 mL  5 mL Oral Q4H PRN Tat, Shanon Brow, MD   5 mL at 03/11/22 1649   ketorolac (TORADOL) 15 MG/ML injection 15 mg  15 mg Intravenous Once Tat, Shanon Brow, MD       loratadine (CLARITIN) tablet 10 mg  10 mg Oral Daily Tat, Shanon Brow, MD   10 mg at 03/12/22 1007   melatonin tablet 3 mg  3 mg Oral QHS Orson Eva, MD   3 mg at 03/11/22 2220   menthol-cetylpyridinium (CEPACOL) lozenge 3 mg  1 lozenge Oral PRN Tat, Shanon Brow, MD       midodrine (PROAMATINE) tablet 5 mg  5 mg Oral BID WC Orson Eva, MD   5 mg at 03/12/22 1219   mirtazapine (REMERON) tablet 7.5 mg  7.5 mg Oral QHS Orson Eva, MD   7.5 mg at 03/11/22 2220   multivitamin-lutein (OCUVITE-LUTEIN) capsule 1 capsule  1 capsule Oral BID WC Orson Eva, MD   1 capsule at 03/12/22 0832   ondansetron (ZOFRAN) injection 4 mg  4 mg Intravenous Q6H PRN Tat, Shanon Brow, MD       Oral care mouth rinse  15 mL Mouth Rinse PRN Tat, Shanon Brow, MD       pantoprazole (PROTONIX) EC tablet 40 mg  40 mg Oral Daily Tat, Shanon Brow, MD   40 mg at 03/12/22 0831   risperiDONE (RISPERDAL) tablet 1 mg  1 mg Oral QHS Orson Eva, MD   1 mg at 03/11/22 2219   sodium chloride flush (NS) 0.9 % injection 3 mL  3 mL Intravenous Q12H Orson Eva, MD   3 mL at 03/12/22 7588    sodium chloride flush (NS) 0.9 % injection 3 mL  3 mL Intravenous PRN Tat, Shanon Brow, MD         Discharge Medications: TAKE these medications     acetaminophen 325 MG tablet Commonly known as: TYLENOL Take 650 mg by mouth every 6 (six) hours as needed for mild pain, moderate pain or fever.    amantadine 100 MG capsule Commonly known as: SYMMETREL Take 100 mg by mouth 2 (two) times daily.    antiseptic oral rinse Liqd 15 mLs by Mouth Rinse route See admin instructions. Use about 15  mL for 30 seconds then spit out up to 5 times daily as needed for dry cough and sore throat.    Breo Ellipta 200-25 MCG/ACT Aepb Generic drug: fluticasone furoate-vilanterol Inhale 1 puff into the lungs daily.    CALCIUM 600+D3 PO Take 1 tablet by mouth daily.    cetirizine 10 MG tablet Commonly known as: ZYRTEC Take 10 mg by mouth daily.    Combivent Respimat 20-100 MCG/ACT Aers respimat Generic drug: Ipratropium-Albuterol Inhale 1 puff into the lungs every 6 (six) hours as needed for shortness of breath or wheezing.    fluticasone 50 MCG/ACT nasal spray Commonly known as: FLONASE Place 2 sprays into both nostrils daily.    furosemide 20 MG tablet Commonly known as: LASIX Take 1 tablet (20 mg total) by mouth daily. Start taking on: March 13, 2022    ibuprofen 400 MG tablet Commonly known as: ADVIL Take 400 mg by mouth every 8 (eight) hours as needed for cramping, moderate pain or fever.    melatonin 3 MG Tabs tablet Take 3 mg by mouth at bedtime.    midodrine 5 MG tablet Commonly known as: PROAMATINE Take 1 tablet (5 mg total) by mouth 2 (two) times daily with a meal. Her systolic blood pressure in the hospital is around 110 to 130 while taking midrodrine '5mg'$  bid, she may not need midodrine long term, pcp to continue to monitor    mirtazapine 7.5 MG tablet Commonly known as: REMERON Take 1 tablet (7.5 mg total) by mouth at bedtime.    omeprazole 20 MG capsule Commonly known as:  PRILOSEC Take 1 capsule (20 mg total) by mouth daily. What changed: when to take this    PreserVision AREDS 2 Caps Take 1 capsule by mouth 2 (two) times daily with a meal.    risperiDONE 1 MG tablet Commonly known as: RISPERDAL Take 1 tablet (1 mg total) by mouth at bedtime.     Relevant Imaging Results:  Relevant Lab Results:   Additional Information SSN: [id number]  Arlynn Stare, Clydene Pugh, LCSW

## 2022-03-12 NOTE — Progress Notes (Signed)
Patient stable and going back to [place]. NT dressed patient and removed IV without issues. Facility sent transportation to pick up patient, NT took patient to car via [place]. Writer went over discharge paperwork with patient, she verbalized understanding  and packed in her belonging bags.

## 2022-03-12 NOTE — Discharge Summary (Addendum)
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  She also complained of some orthopnea type symptoms in the past few days.  She denies any fevers, chills, headache, chest pain, nausea, vomiting, diarrhea, abdominal pain, dysuria, hematuria, hematochezia, melena.  Notably, the patient was recently admitted to the hospital from 01/29/2022 to 02/02/2022 after mechanical fall resulting in subdural hematoma.  She was managed nonoperatively.  She was also treated for UTI.  Prior to that, she had a hospitalization from 01/14/2022 to 01/23/2022 secondary to hyponatremia which was felt to be due to SIADH and decompensated HFrEF.  The patient required hypertonic saline for short period of time and was diuresed.  Her sodium has ranged from 132-135. In the ED, the patient was afebrile and hemodynamically stable.  Oxygen saturation was 84% on room air.  She was placed on 4 L with saturation 96-97%.  BMP showed a sodium 132, potassium 4.3, bicarbonate 25, serum creatinine 1.02.  AST 22, ALT 22, alk phosphatase 96, total bilirubin 0.5.  WBC 7.2, hemoglobin 12.1, platelets 232.  Chest x-ray showed vascular congestion and small left pleural effusion.  BNP was 1059.  Troponin 32>> 29.  EKG shows sinus rhythm with left bundle branch block.  The patient was given furosemide 20 mg IV x 1.  She was continued on IV lasix with stable renal function and clinical improvement.    Assessment and Plan:  Acute  respiratory failure with hypoxia -Secondary to pulmonary edema -Initially on 4 L nasal cannula -Presented with tachypnea and oxygen saturation 84% on room air -Wean oxygen for saturation greater 90% -12/2--down to 2L with saturation 95-96% -12/3--weaned to RA with saturation 94-95%   Acute on chronic HFrEF -03/06/2022 cardiac MRI--diffuse HK with LBBB, LVEF 23%, RVEF of 46%, study consistent with NICM -01/30/22 Echo EF 30-35%, G1DD, mild-mod MR -continue IV furosemide 20 mg IV twice daily -Accurate I's and O's--NEG 4.9L -Daily weights>>back to 179 lbs -The patient's soft blood pressures and orthostatic hypotension has limited GDMT -d/c with lasix 20 mg daily -ReDS reading--27%   Paranoid schizophrenia -Continue amantadine, risperidone and valbenazine   CKD stage IIIa -Baseline creatinine 0.9-1.2 -Monitor with diuresis -serum creatinine 0.98 on day of d/c   COPD -Stable without exacerbation -Continue Breo with therapeutic interchange   H/o SIADH -treated with hypertonic saline at [facility] in  early October 2023 -On fluids restriction 1.2liter  Monitor sodium level>>132 on day of d/c   Chronic hypotension Her systolic blood pressure in the hospital is around 110 to 130 while taking midrodrine 22m bid,    GERD -continue PPI   Obesity -BMI 32.66 -Lifestyle modification   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  -due to CHF and SIADH -fluid restrict 1.2L -Na 132 on day of d/c      Consultants: cardiology Procedures performed: none  [facility]  [facility] DISCHARGE MEDICATION: Allergies as of 03/12/2022       Reactions   Haloperidol Anaphylaxis   Aripiprazole  Other (See Comments)   Bad thoughts    Chlorpheniramine-pse-ibuprofen    Other reaction(s): Cough        Medication List     STOP taking these medications    polyethylene glycol 17 g packet Commonly known as: MIRALAX / GLYCOLAX       TAKE these medications     acetaminophen 325 MG tablet Commonly known as: TYLENOL Take 650 mg by mouth every 6 (six) hours as needed for mild pain, moderate pain or fever.   amantadine 100 MG capsule Commonly known as: SYMMETREL Take 100 mg by mouth 2 (two) times daily.   antiseptic oral rinse Liqd 15 mLs by Mouth Rinse route See admin instructions. Use about 15 mL for 30 seconds then spit out up to 5 times daily as needed for dry cough and sore throat.   Breo Ellipta 200-25 MCG/ACT Aepb Generic drug: fluticasone furoate-vilanterol Inhale 1 puff into the lungs daily.   CALCIUM 600+D3 PO Take 1 tablet by mouth daily.   cetirizine 10 MG tablet Commonly known as: ZYRTEC Take 10 mg by mouth daily.   Combivent Respimat 20-100 MCG/ACT Aers respimat Generic drug: Ipratropium-Albuterol Inhale 1 puff into the lungs every 6 (six) hours as needed for shortness of breath or wheezing.   fluticasone 50 MCG/ACT nasal spray Commonly known as: FLONASE Place 2 sprays into both nostrils daily.   furosemide 20 MG tablet Commonly known as: LASIX Take 1 tablet (20 mg total) by mouth daily. Start taking on: March 13, 2022   ibuprofen 400 MG tablet Commonly known as: ADVIL Take 400 mg by mouth every 8 (eight) hours as needed for cramping, moderate pain or fever.   melatonin 3 MG Tabs tablet Take 3 mg by mouth at bedtime.   midodrine 5 MG tablet Commonly known as: PROAMATINE Take 1 tablet (5 mg total) by mouth 2 (two) times daily with a meal. Her systolic blood pressure in the hospital is around 110 to 130 while taking midrodrine 78m bid, she may not need midodrine long term, pcp to continue to monitor   mirtazapine 7.5 MG tablet Commonly known as: REMERON Take 1 tablet (7.5 mg total) by mouth at bedtime.   omeprazole 20 MG capsule Commonly known as: PRILOSEC Take 1 capsule (20 mg total) by mouth daily. What changed: when to take this   PreserVision AREDS 2 Caps Take 1 capsule by mouth 2 (two) times daily  with a meal.   risperiDONE 1 MG tablet Commonly known as: RISPERDAL Take 1 tablet (1 mg total) by mouth at bedtime.        Discharge Exam: Filed Weights   03/10/22 0500 03/10/22 1400 03/12/22 0600  Weight: 85.6 kg 81.2 kg 82 kg   HEENT:  [place]/AT, No thrush, no icterus CV:  RRR, no rub, no S3, no S4 Lung:  CTA, no wheeze, no rhonchi Abd:  soft/+BS, NT Ext:  No edema, no lymphangitis, no synovitis, no rash   Condition at discharge: stable  The results of significant diagnostics from this hospitalization (including imaging, microbiology, ancillary and laboratory) are listed below for reference.   Imaging Studies: DG Chest Port 1 View  Result Date: 03/09/2022 CLINICAL DATA:  Shortness of breath EXAM: PORTABLE CHEST 1 VIEW COMPARISON:  01/31/2022 FINDINGS: Cardiomegaly and congested appearance of vessels. Dense opacity at the left base primarily attributed to pleural fluid based on abdominal CT 02/01/2022. No pneumothorax. IMPRESSION: CHF pattern with small left pleural effusion. Electronically Signed   By: JJorje Guild  M.D.   On: 03/09/2022 06:25   MR CARDIAC VELOCITY FLOW MAP  Result Date: 03/06/2022 : Reported with cardiac MRI, see separate report. Electronically Signed   By: Loralie Champagne M.D.   On: 03/06/2022 16:21   MR CARDIAC VELOCITY FLOW MAP  Result Date: 03/06/2022 : Reported with cardiac MRI, see separate report. Electronically Signed   By: Loralie Champagne M.D.   On: 03/06/2022 16:21   MR CARDIAC MORPHOLOGY W WO CONTRAST  Result Date: 03/06/2022 CLINICAL DATA:  Cardiomyopathy of uncertain etiology EXAM: CARDIAC MRI TECHNIQUE: The patient was scanned on a 1.5 Tesla GE magnet. A dedicated cardiac coil was used. Functional imaging was done using Fiesta sequences. 2,3, and 4 chamber views were done to assess for RWMA's. Modified Simpson's rule using a short axis stack was used to calculate an ejection fraction on a dedicated work Conservation officer, nature. The  patient received 11 cc of Gadavist. After 10 minutes inversion recovery sequences were used to assess for infiltration and scar tissue. CONTRAST:  Gadavist 11 cc FINDINGS: Several cystic liver lesions noted, largest 3.7 cm in diameter. Moderate bilateral pleural effusions. Trivial pericardial effusion. Moderately dilated left ventricle with normal wall thickness. Septal-lateral dyssynchrony consistent with LBBB, global hypokinesis with EF 23%. Normal right ventricular size with mildly reduced systolic function, EF 69%. Mild left atrial enlargement, normal right atrial size. Visually, there is at least mild mitral regurgitation. By flow analysis, there is no significant mitral regurgitation. Trileaflet aortic valve, no signficant stenosis, trivial aortic insufficiency. On delayed enhancement imaging, there was minimal subepicardial late gadolinium enhancement (LGE) at the basal inferoseptal RV insertion site. MEASUREMENTS: MEASUREMENTS LVEDV 273 mL LVEDVi 145 mL/m2 LVSV 62 mL LVEF 23% RVEDV 125 mL RVEDVi 67 mL/m2 RVSV 58 mL RVEF 46% T1 1083, ECV 30% Aortic forward volume 62 mL Aortic regurgitant fraction 9% Pulmonary forward volume 63 mL Pulmonary regurgitant fraction 2% IMPRESSION: 1.  Moderate bilateral pleural effusions. 2.  Cystic liver lesions noted as above. 3. Moderately dilated LV, diffuse hypokinesis with septal-lateral dyssynchrony consistent with LBBB; EF 23%. 4.  Normal RV size with EF 46%. 5. Nonspecific RV insertion site LGE, this can be seen with pressure/volume overload. 6. Borderline elevated extracellular volume percentage, suggesting some replacement fibrosis. 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 [name] Electronically Signed   By: Loralie Champagne M.D.   On: 03/06/2022 16:21    Microbiology: Results for orders placed or performed during the hospital encounter of 03/09/22  SARS Coronavirus 2 by RT PCR (hospital order, performed in [facility] hospital lab) *cepheid  single result test* Anterior Nasal Swab     Status: None   Collection Time: 03/09/22  1:30 PM   Specimen: Anterior Nasal Swab  Result Value Ref Range Status   SARS Coronavirus 2 by RT PCR NEGATIVE NEGATIVE Final    Comment: (NOTE) SARS-CoV-2 target nucleic acids are NOT DETECTED.  The SARS-CoV-2 RNA is generally detectable in upper and lower respiratory specimens during the acute phase of infection. The lowest concentration of SARS-CoV-2 viral copies this assay can detect is 250 copies / mL. A negative result does not preclude SARS-CoV-2 infection and should not be used as the sole basis for treatment or other patient management decisions.  A negative result may occur with improper specimen collection / handling, submission of specimen other than nasopharyngeal swab, presence of viral mutation(s) within the areas targeted by this assay, and inadequate number of viral copies (<250 copies / mL). A negative result must  be combined with clinical observations, patient history, and epidemiological information.  Fact Sheet for Patients:   https://www.patel.info/  Fact Sheet for Healthcare Providers: https://hall.com/  This test is not yet approved or  cleared by the [place] FDA and has been authorized for detection and/or diagnosis of SARS-CoV-2 by FDA under an Emergency Use Authorization (EUA).  This EUA will remain in effect (meaning this test can be used) for the duration of the COVID-19 declaration under Section 564(b)(1) of the Act, 21 U.S.C. section 360bbb-3(b)(1), unless the authorization is terminated or revoked sooner.  Performed at [facility], [address], [place], [place] [address]     Labs: CBC: Recent Labs  Lab 03/09/22 0555  WBC 7.2  HGB 12.1  HCT 38.3  MCV 93.6  PLT 956   Basic Metabolic Panel: Recent Labs  Lab 03/09/22 0555 03/10/22 0532 03/11/22 0456 03/12/22 0322  NA 132* 133* 131* 132*  K 4.3 4.2  4.0 4.1  CL 98 98 96* 97*  CO2 _0 GLUCOSE 97 94 91 80  BUN 19 21 25* 22  CREATININE 1.02* 1.02* 1.17* 0.98  CALCIUM 8.5* 8.4* 8.4* 8.1*  MG  --  1.9  --   --    Liver Function Tests: Recent Labs  Lab 03/09/22 0555  AST 22  ALT 22  ALKPHOS 96  BILITOT 0.5  PROT 5.9*  ALBUMIN 3.6   CBG: Recent Labs  Lab 03/10/22 2129  GLUCAP 110*    Discharge time spent: greater than 30 minutes.  Signed: Orson Eva, MD [facility] 03/12/2022

## 2022-03-12 NOTE — Care Management Important Message (Signed)
Important Message  Patient Details  Name: [name] MRN: [id number] Date of Birth: 1941-02-04   Medicare Important Message Given:  Other (see comment) (unable to reach patient or legal guardian by phone at [phone], [phone]- copy mailed to address on file)     Tommy Medal 03/12/2022, 11:24 AM

## 2022-03-12 NOTE — Progress Notes (Signed)
Rounding Note    Patient Name: CANDENCE SEASE Date of Encounter: 03/12/2022  [place] Cardiologist: Fransico Him, MD   Subjective   No complaints  Inpatient Medications    Scheduled Meds:  amantadine  100 mg Oral BID   calcium-vitamin D  1 tablet Oral Q breakfast   enoxaparin (LOVENOX) injection  40 mg Subcutaneous Q24H   fluticasone  2 spray Each Nare Daily   fluticasone furoate-vilanterol  1 puff Inhalation Daily   furosemide  20 mg Oral Daily   loratadine  10 mg Oral Daily   melatonin  3 mg Oral QHS   midodrine  5 mg Oral BID WC   mirtazapine  7.5 mg Oral QHS   multivitamin-lutein  1 capsule Oral BID WC   pantoprazole  40 mg Oral Daily   risperiDONE  1 mg Oral QHS   sodium chloride flush  3 mL Intravenous Q12H   Continuous Infusions:  sodium chloride     PRN Meds: sodium chloride, acetaminophen, guaiFENesin-dextromethorphan, menthol-cetylpyridinium, ondansetron (ZOFRAN) IV, mouth rinse, sodium chloride flush   Vital Signs    Vitals:   03/12/22 0500 03/12/22 0600 03/12/22 0730 03/12/22 0840  BP: (!) 166/75   (!) 106/51  Pulse: 77   80  Resp: 20     Temp: 98.2 F (36.8 C)     TempSrc:      SpO2: 90%  91%   Weight:  82 kg    Height:        Intake/Output Summary (Last 24 hours) at 03/12/2022 0843 Last data filed at 03/12/2022 0600 Gross per 24 hour  Intake 480 ml  Output 1554 ml  Net -1074 ml      03/12/2022    6:00 AM 03/10/2022    2:00 PM 03/10/2022    5:00 AM  Last 3 Weights  Weight (lbs) 180 lb 12.4 oz 179 lb 188 lb 11.4 oz  Weight (kg) 82 kg 81.194 kg 85.6 kg      Telemetry    NSR - Personally Reviewed  ECG    N/a - Personally Reviewed  Physical Exam   GEN: No acute distress.   Neck: No JVD Cardiac: RRR, no murmurs, rubs, or gallops.  Respiratory: Clear to auscultation bilaterally. GI: Soft, nontender, non-distended  MS: No edema; No deformity. Neuro:  Nonfocal  Psych: Normal affect   Labs    High Sensitivity  Troponin:   Recent Labs  Lab 03/09/22 0555 03/09/22 0748  TROPONINIHS 32* 29*     Chemistry Recent Labs  Lab 03/09/22 0555 03/10/22 0532 03/11/22 0456 03/12/22 0322  NA 132* 133* 131* 132*  K 4.3 4.2 4.0 4.1  CL 98 98 96* 97*  CO2 '25 28 25 27  '$ GLUCOSE 97 94 91 80  BUN 19 21 25* 22  CREATININE 1.02* 1.02* 1.17* 0.98  CALCIUM 8.5* 8.4* 8.4* 8.1*  MG  --  1.9  --   --   PROT 5.9*  --   --   --   ALBUMIN 3.6  --   --   --   AST 22  --   --   --   ALT 22  --   --   --   ALKPHOS 96  --   --   --   BILITOT 0.5  --   --   --   GFRNONAA 56* 56* 47* 58*  ANIONGAP '9 7 10 8    '$ Lipids No results for input(s): "CHOL", "TRIG", "  HDL", "LABVLDL", "Ferris", "CHOLHDL" in the last 168 hours.  Hematology Recent Labs  Lab 03/09/22 0555  WBC 7.2  RBC 4.09  HGB 12.1  HCT 38.3  MCV 93.6  MCH 29.6  MCHC 31.6  RDW 14.0  PLT 232   Thyroid No results for input(s): "TSH", "FREET4" in the last 168 hours.  BNP Recent Labs  Lab 03/09/22 0555  BNP 1,059.0*    DDimer No results for input(s): "DDIMER" in the last 168 hours.   Radiology    No results found.  Cardiac Studies    Patient Profile     WESLYN HOLSONBACK is a 81 y.o. female with a hx of HFrEF (EF 25% by echo in 08/2021 with Cardiac PET in 11/2021 showing no evidence of ischemia, EF at 30-35% in 01/2022 and LVEF 23% on cardiac MRI on 03/06/22 ), CKD (s/p left nephrectomy), anemia, asthma and GERD who is being seen 03/09/2022 for the evaluation of CHF at the request of Dr. Carles Collet.   Assessment & Plan    1.Acute on chronic HFrEF - 01/2022 echo LVE 30-35%< grade I dd, normal RV - 11/2021 stress PET no ischemia - 02/2022 cMRI on infiltrative disease, consistent findings with NICM - CXR CHF pattern, BNP 1059. 03/10/22 REDs vest 27%  -negative 1.5 L yesterday, neg 4.9 L since admission. Had been on IV lasix '20mg'$  bid, uptrend in Cr and transitioned to '20mg'$  oral daily. Cr has trended back down.  - currently euvolemic, continue oral  lasix.  - medical therapy limited by low bp's and orthostatic hypotension, she has been on midodrine at home    2. Paranoid schizophrenia  No further cardiology recs at this time, we will sign off inpatient care and schedule f/u.     For questions or updates, please contact [place] Please consult [email or web address] for contact info under        Signed, Carlyle Dolly, MD  03/12/2022, 8:43 AM

## 2022-03-12 NOTE — TOC Transition Note (Addendum)
Transition of Care [facility]) - CM/SW Discharge Note   Patient Details  Name: MAURIAH MCMILLEN MRN: [id number] Date of Birth: [birth year]  Transition of Care [facility]) CM/SW Contact:  Ihor Gully, LCSW Phone Number: 03/12/2022, 12:41 PM   Clinical Narrative:    Discharge clinicals sent to facility. Alex at [facility] indicates that Maudie Mercury will review clinicals and return contact when they are enroute. Heart failure home screen not completed as patient is a facility resident. Facility is responsible for meal planning and preparation based on physician recommendations.     Final next level of care: Assisted Living Barriers to Discharge: No Barriers Identified   Patient Goals and CMS Choice Patient states their goals for this hospitalization and ongoing recovery are:: get better CMS Medicare.gov Compare Post Acute Care list provided to:: Patient Represenative (must comment) Choice offered to / list presented to : [facility] POA / Guardian (niece)  Discharge Placement                Patient to be transferred to facility by: facility staff Name of family member notified: niece, Noralee Space Patient and family notified of of transfer: 03/12/22  Discharge Plan and Services In-house Referral: Clinical Social Work Discharge Planning Services: [employer] Consult                                 Social Determinants of Health (SDOH) Interventions     Readmission Risk Interventions     No data to display

## 2022-03-28 ENCOUNTER — Other Ambulatory Visit (HOSPITAL_COMMUNITY)
Admission: RE | Admit: 2022-03-28 | Discharge: 2022-03-28 | Disposition: A | Discharge status: Home / Self Care | Payer: Medicare Other | Source: Ambulatory Visit | Attending: Nurse Practitioner | Admitting: Nurse Practitioner

## 2022-03-28 ENCOUNTER — Encounter: Payer: Self-pay | Admitting: Nurse Practitioner

## 2022-03-28 ENCOUNTER — Ambulatory Visit: Payer: Medicare Other | Attending: Physician Assistant | Admitting: Nurse Practitioner

## 2022-03-28 VITALS — BP 132/84 | HR 79 | Ht 62.5 in | Wt 179.0 lb

## 2022-03-28 DIAGNOSIS — I428 Other cardiomyopathies: Secondary | ICD-10-CM

## 2022-03-28 DIAGNOSIS — I5022 Chronic systolic (congestive) heart failure: Secondary | ICD-10-CM | POA: Diagnosis not present

## 2022-03-28 DIAGNOSIS — I951 Orthostatic hypotension: Secondary | ICD-10-CM | POA: Diagnosis not present

## 2022-03-28 DIAGNOSIS — N183 Chronic kidney disease, stage 3 unspecified: Secondary | ICD-10-CM | POA: Diagnosis not present

## 2022-03-28 DIAGNOSIS — E871 Hypo-osmolality and hyponatremia: Secondary | ICD-10-CM

## 2022-03-28 LAB — BASIC METABOLIC PANEL
Anion gap: 6 (ref 5–15)
BUN: 23 mg/dL (ref 8–23)
CO2: 29 mmol/L (ref 22–32)
Calcium: 8.8 mg/dL — ABNORMAL LOW (ref 8.9–10.3)
Chloride: 100 mmol/L (ref 98–111)
Creatinine, Ser: 1.34 mg/dL — ABNORMAL HIGH (ref 0.44–1.00)
GFR, Estimated: 40 mL/min — ABNORMAL LOW (ref >60)
Glucose, Bld: 102 mg/dL — ABNORMAL HIGH (ref 70–99)
Potassium: 4.5 mmol/L (ref 3.5–5.1)
Sodium: 135 mmol/L (ref 135–145)

## 2022-03-28 NOTE — Progress Notes (Signed)
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  Past Medical History    Past Medical History:  Diagnosis Date   Anemia 1990   hx of   Anxiety    Arthritis 2004   Asthma 1995   uses inhaler   Chronic HFrEF (heart failure with reduced ejection fraction) ([name])    a. 07/2021 Echo: EF 25%; b. 01/2022 Echo: EF 30-35%, glob HK, GrI DD, nl RV fxn, mild-mod MR; c. 02/2022 cMRI: EF 23%.   CKD (chronic kidney disease), stage III ([name]) 2010   a. Solitary kidney - s/p L nephrectomy in 2010.   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 07/2021 Echo: EF 25%; b. 11/2021 Lexi PET/CT: EF 28% (35% w/ stress). Cor Ca2+. No ischemia; c.01/2022 Echo: EF 30-35%, glob HK, GrI DD, nl RV fxn, mild-mod MR; d. 02/2022 cMRI: EF 23%, diff HK, septal-lateral dyssynchrony w/ LBBB. Nonspecific RV insertion site LGE, borderline elev extracellular volume percentage sugg some replacement fibrosis-->NICM.   Schizophrenia ([name])    Severe left ventricular systolic dysfunction (LVSD)    EF 23% by cardiac MRI   Past Surgical History:  Procedure Laterality Date   APPENDECTOMY  1966   CATARACT EXTRACTION     Bilateral   CHOLECYSTECTOMY  2004   COLON SURGERY  2008   COLONOSCOPY  2013   JP-MAC-moviprep(good)-TA   COLOSTOMY     COLOSTOMY TAKEDOWN     DIAGNOSTIC LAPAROSCOPY     exploratory for endometriosis   HEEL SPUR  SURGERY     Right, Metal Plate in [name]  2010   NEPHRECTOMY Left 1027   As complication of partial colectomy for diverticulitis   POLYPECTOMY  2013   TA   TONSILLECTOMY     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   Procedure: LEFT TOTAL KNEE ARTHROPLASTY;  Surgeon: Elizabeth Day, MD;  Location: [facility];  Service: Orthopedics;  Laterality: Left;  Adductor Block    Allergies  Allergies  Allergen Reactions   Haloperidol Anaphylaxis   Aripiprazole Other (See Comments)    Bad thoughts    Chlorpheniramine-Pse-Ibuprofen     Other reaction(s): Cough    History of Present Illness    81 year old female with above past medical history including nonischemic cardiomyopathy, chronic HFrEF, anxiety, asthma, anemia, and 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  Earlier this year, she was evaluated by her primary care provider with a chest x-ray, which showed cardiomegaly, she was subsequently referred for an echocardiogram.  This was performed at an outside institution and showed an EF of 25% with evidence of mild mitral regurgitation.  She establish care with [name] in May 2023.  In the setting of stage III chronic kidney disease with a solitary kidney, there was desire to avoid contrasted studies.  Lexiscan PET/CT was subsequently performed in August 2023, showing  an EF of 28% at rest (35% at stress), no ischemia, and significant coronary calcifications.  GDMT limited by relative hypotension and orthostasis (requires midodrine at home).  A follow-up echocardiogram in October 2023, showed persistent LV dysfunction with an EF of 30 to 35%, global hypokinesis, grade 1 diastolic dysfunction, mild to moderate MR, and moderate left pleural effusion.  Cardiac MRI in November 2023 showed an EF of 23% with diffuse hypokinesis, septal-lateral dyssynchrony in the setting of left bundle branch block, nonspecific RV insertion site LGE, borderline elevated  extracellular volume percentage, suggesting some replacement fibrosis, moderate bilateral pleural effusions, and cystic liver lesions.  Patient was contacted and subsequently referred to the advanced heart failure clinic.  [name] has had several admissions since October.  She was initially admitted in early October secondary to hyponatremia and SIADH in the setting of decompensated heart failure.  She was readmitted to [name] from October 23 October 27 following a mechanical fall resulting in a subdural hematoma.  She was also treated for UTI.  Most recently, she presented on December 1 with recurrent dyspnea and heart failure.  She was aggressively diuresed and was able to be discharged home on December 4 on Lasix 20 mg daily.  Discharge weight was 82 kg.  Since discharge, she has done well.  She is only on Lasix 20 mg a Logan and her weight has been fairly steady at 179 pounds.  She has not been requiring midodrine.  She denies chest pain, dyspnea, palpitations, PND, orthopnea, dizziness, syncope, edema, or early satiety.  She is interested in referral to advanced heart failure clinic.  Home Medications    Current Outpatient Medications  Medication Sig Dispense Refill   acetaminophen (TYLENOL) 325 MG tablet Take 650 mg by mouth every 6 (six) hours as needed for mild pain, moderate pain or fever.     amantadine (SYMMETREL) 100 MG capsule Take 100 mg by mouth 2 (two) times daily.     BREO ELLIPTA 200-25 MCG/INH AEPB Inhale 1 puff into the lungs daily.     fluticasone (FLONASE) 50 MCG/ACT nasal spray Place 2 sprays into both nostrils daily.     gabapentin (NEURONTIN) 100 MG capsule Take 100 mg by mouth at bedtime.     melatonin 3 MG TABS tablet Take 3 mg by mouth at bedtime.     omeprazole (PRILOSEC) 20 MG capsule Take 1 capsule (20 mg total) by mouth daily. (Patient taking differently: Take 20 mg by mouth 2 (two) times daily.) 30 capsule 0   risperiDONE (RISPERDAL) 1 MG tablet Take 1 tablet (1  mg total) by mouth at bedtime. 30 tablet 1   traZODone (DESYREL) 150 MG tablet Take by mouth at bedtime.     antiseptic oral rinse (BIOTENE) LIQD 15 mLs by Mouth Rinse route See admin instructions. Use about 15 mL for 30 seconds then spit out up to 5 times daily as needed for dry cough and sore throat.     Calcium Carb-Cholecalciferol (CALCIUM 600+D3 PO) Take 1 tablet by mouth daily.     cetirizine (ZYRTEC) 10 MG tablet Take 10 mg by mouth daily.     furosemide (LASIX) 20 MG tablet Take 1 tablet (20 mg total) by mouth daily. 30 tablet 1   ibuprofen (ADVIL) 400 MG tablet Take 400 mg by mouth every 8 (eight) hours as needed for cramping, moderate pain or fever.     Ipratropium-Albuterol (COMBIVENT RESPIMAT) 20-100 MCG/ACT AERS respimat Inhale 1 puff into the lungs every 6 (  six) hours as needed for shortness of breath or wheezing.     midodrine (PROAMATINE) 5 MG tablet Take 1 tablet (5 mg total) by mouth 2 (two) times daily with a meal. Her systolic blood pressure in the hospital is around 110 to 130 while taking midrodrine 53m bid, she may not need midodrine long term, pcp to continue to monitor 60 tablet 0   mirtazapine (REMERON) 7.5 MG tablet Take 1 tablet (7.5 mg total) by mouth at bedtime. 30 tablet 0   Multiple Vitamins-Minerals (PRESERVISION AREDS 2) CAPS Take 1 capsule by mouth 2 (two) times daily with a meal.     No current facility-administered medications for this visit.     Review of Systems    She denies chest pain, palpitations, dyspnea, pnd, orthopnea, n, v, dizziness, syncope, edema, weight gain, or early satiety.  All other systems reviewed and are otherwise negative except as noted above.    Physical Exam    VS:  BP 132/84   Pulse 79   Ht 5' 2.5" (1.588 m)   Wt 179 lb (81.2 kg)   SpO2 95%   BMI 32.22 kg/m  , BMI Body mass index is 32.22 kg/m.     GEN: Well nourished, well developed, in no acute distress. HEENT: normal. Neck: Supple, no JVD, carotid bruits, or  masses. Cardiac: RRR, 2/6 systolic murmur at the upper sternal borders and left sternal border.  No rubs or gallops. No clubbing, cyanosis, edema.  Radials 2+/PT 2+ and equal bilaterally.  Respiratory:  Respirations regular and unlabored, clear to auscultation bilaterally. GI: Soft, nontender, nondistended, BS + x 4. MS: no deformity or atrophy. Skin: warm and dry, no rash. Neuro:  Strength and sensation are intact. Psych: Normal affect.  Accessory Clinical Findings    Lab Results  Component Value Date   WBC 7.2 03/09/2022   HGB 12.1 03/09/2022   HCT 38.3 03/09/2022   MCV 93.6 03/09/2022   PLT 232 03/09/2022   Lab Results  Component Value Date   CREATININE 1.34 (H) 03/28/2022   BUN 23 03/28/2022   Logan 135 03/28/2022   K 4.5 03/28/2022   CL 100 03/28/2022   CO2 29 03/28/2022   Lab Results  Component Value Date   ALT 22 03/09/2022   AST 22 03/09/2022   ALKPHOS 96 03/09/2022   BILITOT 0.5 03/09/2022   Lab Results  Component Value Date   CHOL 139 10/29/2016   HDL 43 10/29/2016   LDLCALC 68 10/29/2016   TRIG 141 10/29/2016   CHOLHDL 3.2 10/29/2016     Assessment & Plan    1.  Chronic heart failure with reduced ejection fraction/nonischemic cardiomyopathy: EF 23% by cardiac MRI in November without significant LGE.  Findings consistent with nonischemic cardiomyopathy: [facility].  GDMT remains limited by relative hypotension and orthostasis, though notably, pressures better today and she is no longer on midodrine.  She is euvolemic on examination today.  Her weights have been trending 179 pounds at home (discharge weight was 180 pounds).  Continue current dose of Lasix.  Mild creatinine elevation on labs today in the setting of her significantly reducing oral intake secondary to history of hyponatremia.  Encouraged some improvement on oral hydration.  We discussed potential [place] heart  [place]  She was initially reluctant but then excepted the referral.  She understands that options might be limited in the setting of intolerance to  GDMT.  We did not address potential EP referral for device therapy, though she may benefit from biventricular device in the setting of left bundle branch block and QRS greater than 150 ms.  2.  Relative hypotension/orthostatic lightheadedness: Soft blood pressures historically in the outpatient setting as well as during recent admission, requiring midodrine.  She is not currently on midodrine and pressure is actually stable today at 132/84.  In the setting of prior soft blood pressures, GDMT has been limited and she is only on Lasix 20 mg once a Logan.  3.  Stage III chronic kidney disease: Labs today show mild elevation in creatinine to 1.34.  She is currently on Lasix 20 mg daily.  In the setting of prior hyponatremia, she has really been restricting her fluids at home and I think she just needs to find a happy medium.  Improved hydration encouraged.  4.  Hyponatremia: Sodium is normal today.  5.  Disposition: Referral to advanced heart failure clinic.  Follow-up here pending evaluation there.  Murray Hodgkins, NP 03/28/2022, 5:16 PM

## 2022-03-28 NOTE — Patient Instructions (Signed)
Medication Instructions:  Your physician recommends that you continue on your current medications as directed. Please refer to the Current Medication list given to you today.   Labwork: BMET- TODAY  Testing/Procedures: None  Follow-Up: Follow up is pending.   Any Other Special Instructions Will Be Listed Below (If Applicable).     If you need a refill on your cardiac medications before your next appointment, please call your pharmacy.

## 2022-03-30 ENCOUNTER — Other Ambulatory Visit: Payer: Self-pay | Admitting: *Deleted

## 2022-03-30 ENCOUNTER — Telehealth: Payer: Self-pay | Admitting: Cardiology

## 2022-03-30 DIAGNOSIS — I5022 Chronic systolic (congestive) heart failure: Secondary | ICD-10-CM

## 2022-03-30 NOTE — Telephone Encounter (Signed)
Patient's niece states she is returning a call.

## 2022-03-30 NOTE — Telephone Encounter (Signed)
See results note message .[name]

## 2022-04-17 ENCOUNTER — Encounter (INDEPENDENT_AMBULATORY_CARE_PROVIDER_SITE_OTHER): Payer: Medicare Other | Admitting: Ophthalmology

## 2022-04-17 DIAGNOSIS — I10 Essential (primary) hypertension: Secondary | ICD-10-CM | POA: Diagnosis not present

## 2022-04-17 DIAGNOSIS — H43813 Vitreous degeneration, bilateral: Secondary | ICD-10-CM

## 2022-04-17 DIAGNOSIS — H35033 Hypertensive retinopathy, bilateral: Secondary | ICD-10-CM

## 2022-04-17 DIAGNOSIS — H4423 Degenerative myopia, bilateral: Secondary | ICD-10-CM | POA: Diagnosis not present

## 2022-04-18 ENCOUNTER — Telehealth (HOSPITAL_COMMUNITY): Payer: Self-pay | Admitting: Surgery

## 2022-04-18 NOTE — Telephone Encounter (Signed)
I attempted to reach patient in reference to referral to the AHF clinic. I left a message requesting a call back to schedule.

## 2022-04-23 ENCOUNTER — Emergency Department (HOSPITAL_COMMUNITY): Payer: 59

## 2022-04-23 ENCOUNTER — Observation Stay (HOSPITAL_COMMUNITY)
Admission: EM | Admit: 2022-04-23 | Discharge: 2022-04-25 | Disposition: A | Discharge status: Home Health | Payer: 59 | Attending: Internal Medicine | Admitting: Internal Medicine

## 2022-04-23 ENCOUNTER — Other Ambulatory Visit: Payer: Self-pay

## 2022-04-23 ENCOUNTER — Encounter (HOSPITAL_COMMUNITY): Payer: Self-pay

## 2022-04-23 DIAGNOSIS — I6782 Cerebral ischemia: Secondary | ICD-10-CM | POA: Insufficient documentation

## 2022-04-23 DIAGNOSIS — Z1152 Encounter for screening for COVID-19: Secondary | ICD-10-CM | POA: Insufficient documentation

## 2022-04-23 DIAGNOSIS — J449 Chronic obstructive pulmonary disease, unspecified: Secondary | ICD-10-CM | POA: Diagnosis not present

## 2022-04-23 DIAGNOSIS — E669 Obesity, unspecified: Secondary | ICD-10-CM | POA: Diagnosis present

## 2022-04-23 DIAGNOSIS — N183 Chronic kidney disease, stage 3 unspecified: Secondary | ICD-10-CM | POA: Diagnosis not present

## 2022-04-23 DIAGNOSIS — J9601 Acute respiratory failure with hypoxia: Secondary | ICD-10-CM | POA: Diagnosis not present

## 2022-04-23 DIAGNOSIS — J441 Chronic obstructive pulmonary disease with (acute) exacerbation: Secondary | ICD-10-CM | POA: Insufficient documentation

## 2022-04-23 DIAGNOSIS — E871 Hypo-osmolality and hyponatremia: Secondary | ICD-10-CM | POA: Diagnosis present

## 2022-04-23 DIAGNOSIS — F312 Bipolar disorder, current episode manic severe with psychotic features: Secondary | ICD-10-CM | POA: Diagnosis present

## 2022-04-23 DIAGNOSIS — I10 Essential (primary) hypertension: Secondary | ICD-10-CM | POA: Diagnosis present

## 2022-04-23 DIAGNOSIS — I130 Hypertensive heart and chronic kidney disease with heart failure and stage 1 through stage 4 chronic kidney disease, or unspecified chronic kidney disease: Secondary | ICD-10-CM | POA: Insufficient documentation

## 2022-04-23 DIAGNOSIS — K219 Gastro-esophageal reflux disease without esophagitis: Secondary | ICD-10-CM | POA: Diagnosis present

## 2022-04-23 DIAGNOSIS — W19XXXA Unspecified fall, initial encounter: Secondary | ICD-10-CM

## 2022-04-23 DIAGNOSIS — Y92009 Unspecified place in unspecified non-institutional (private) residence as the place of occurrence of the external cause: Secondary | ICD-10-CM | POA: Diagnosis not present

## 2022-04-23 DIAGNOSIS — Z96652 Presence of left artificial knee joint: Secondary | ICD-10-CM | POA: Insufficient documentation

## 2022-04-23 DIAGNOSIS — Z79899 Other long term (current) drug therapy: Secondary | ICD-10-CM | POA: Insufficient documentation

## 2022-04-23 DIAGNOSIS — R0602 Shortness of breath: Secondary | ICD-10-CM | POA: Diagnosis present

## 2022-04-23 DIAGNOSIS — R0902 Hypoxemia: Secondary | ICD-10-CM

## 2022-04-23 DIAGNOSIS — J45909 Unspecified asthma, uncomplicated: Secondary | ICD-10-CM | POA: Insufficient documentation

## 2022-04-23 DIAGNOSIS — F419 Anxiety disorder, unspecified: Secondary | ICD-10-CM | POA: Diagnosis present

## 2022-04-23 DIAGNOSIS — I5022 Chronic systolic (congestive) heart failure: Secondary | ICD-10-CM | POA: Insufficient documentation

## 2022-04-23 DIAGNOSIS — F200 Paranoid schizophrenia: Secondary | ICD-10-CM | POA: Diagnosis present

## 2022-04-23 HISTORY — DX: Chronic obstructive pulmonary disease, unspecified: J44.9

## 2022-04-23 LAB — CBC WITH DIFFERENTIAL/PLATELET
Abs Immature Granulocytes: 0.02 10*3/uL (ref 0.00–0.07)
Basophils Absolute: 0.1 10*3/uL (ref 0.0–0.1)
Basophils Relative: 1 %
Eosinophils Absolute: 0.2 10*3/uL (ref 0.0–0.5)
Eosinophils Relative: 3 %
HCT: 40.4 % (ref 36.0–46.0)
Hemoglobin: 12.7 g/dL (ref 12.0–15.0)
Immature Granulocytes: 0 %
Lymphocytes Relative: 25 %
Lymphs Abs: 1.6 10*3/uL (ref 0.7–4.0)
MCH: 29.3 pg (ref 26.0–34.0)
MCHC: 31.4 g/dL (ref 30.0–36.0)
MCV: 93.1 fL (ref 80.0–100.0)
Monocytes Absolute: 0.7 10*3/uL (ref 0.1–1.0)
Monocytes Relative: 11 %
Neutro Abs: 3.8 10*3/uL (ref 1.7–7.7)
Neutrophils Relative %: 60 %
Platelets: 214 10*3/uL (ref 150–400)
RBC: 4.34 MIL/uL (ref 3.87–5.11)
RDW: 14.3 % (ref 11.5–15.5)
WBC: 6.4 10*3/uL (ref 4.0–10.5)
nRBC: 0 % (ref 0.0–0.2)

## 2022-04-23 LAB — BASIC METABOLIC PANEL
Anion gap: 7 (ref 5–15)
BUN: 24 mg/dL — ABNORMAL HIGH (ref 8–23)
CO2: 26 mmol/L (ref 22–32)
Calcium: 8.3 mg/dL — ABNORMAL LOW (ref 8.9–10.3)
Chloride: 101 mmol/L (ref 98–111)
Creatinine, Ser: 1.03 mg/dL — ABNORMAL HIGH (ref 0.44–1.00)
GFR, Estimated: 55 mL/min — ABNORMAL LOW (ref >60)
Glucose, Bld: 123 mg/dL — ABNORMAL HIGH (ref 70–99)
Potassium: 4 mmol/L (ref 3.5–5.1)
Sodium: 134 mmol/L — ABNORMAL LOW (ref 135–145)

## 2022-04-23 LAB — RESP PANEL BY RT-PCR (RSV, FLU A&B, COVID)  RVPGX2
Influenza A by PCR: NEGATIVE
Influenza B by PCR: NEGATIVE
Resp Syncytial Virus by PCR: NEGATIVE
SARS Coronavirus 2 by RT PCR: NEGATIVE

## 2022-04-23 LAB — BRAIN NATRIURETIC PEPTIDE: B Natriuretic Peptide: 883 pg/mL — ABNORMAL HIGH (ref 0.0–100.0)

## 2022-04-23 LAB — TROPONIN I (HIGH SENSITIVITY)
Troponin I (High Sensitivity): 22 ng/L — ABNORMAL HIGH (ref <18)
Troponin I (High Sensitivity): 25 ng/L — ABNORMAL HIGH (ref <18)

## 2022-04-23 MED ORDER — LORATADINE 10 MG PO TABS
10.0000 mg | ORAL_TABLET | Freq: Every day | ORAL | Status: DC
  Administered 2022-04-23 – 2022-04-25 (×3): 10 mg via ORAL
  Filled 2022-04-23 (×3): qty 1

## 2022-04-23 MED ORDER — IPRATROPIUM-ALBUTEROL 0.5-2.5 (3) MG/3ML IN SOLN: 3.0000 mL | Freq: Four times a day (QID) | RESPIRATORY_TRACT | Status: DC | PRN

## 2022-04-23 MED ORDER — TRAZODONE HCL 50 MG PO TABS
150.0000 mg | ORAL_TABLET | Freq: Every day | ORAL | Status: DC
  Administered 2022-04-23 – 2022-04-24 (×2): 150 mg via ORAL
  Filled 2022-04-23 (×2): qty 3

## 2022-04-23 MED ORDER — AMANTADINE HCL 100 MG PO CAPS
100.0000 mg | ORAL_CAPSULE | Freq: Two times a day (BID) | ORAL | Status: DC
  Administered 2022-04-23 – 2022-04-25 (×5): 100 mg via ORAL
  Filled 2022-04-23 (×5): qty 1

## 2022-04-23 MED ORDER — GABAPENTIN 100 MG PO CAPS
100.0000 mg | ORAL_CAPSULE | Freq: Every day | ORAL | Status: DC
  Administered 2022-04-23 – 2022-04-24 (×2): 100 mg via ORAL
  Filled 2022-04-23 (×2): qty 1

## 2022-04-23 MED ORDER — OCUVITE-LUTEIN PO CAPS
2.0000 | ORAL_CAPSULE | Freq: Every day | ORAL | Status: DC
  Administered 2022-04-23 – 2022-04-25 (×3): 2 via ORAL
  Filled 2022-04-23 (×3): qty 2

## 2022-04-23 MED ORDER — ONDANSETRON HCL 4 MG/2ML IJ SOLN: 4.0000 mg | Freq: Four times a day (QID) | INTRAMUSCULAR | Status: DC | PRN

## 2022-04-23 MED ORDER — SODIUM CHLORIDE 0.9% FLUSH
3.0000 mL | Freq: Two times a day (BID) | INTRAVENOUS | Status: DC
  Administered 2022-04-23 – 2022-04-25 (×4): 3 mL via INTRAVENOUS

## 2022-04-23 MED ORDER — ONDANSETRON HCL 4 MG PO TABS: 4.0000 mg | ORAL_TABLET | Freq: Four times a day (QID) | ORAL | Status: DC | PRN

## 2022-04-23 MED ORDER — SODIUM CHLORIDE 0.9% FLUSH: 3.0000 mL | INTRAVENOUS | Status: DC | PRN

## 2022-04-23 MED ORDER — MELATONIN 3 MG PO TABS
3.0000 mg | ORAL_TABLET | Freq: Every day | ORAL | Status: DC
  Administered 2022-04-23 – 2022-04-24 (×2): 3 mg via ORAL
  Filled 2022-04-23 (×2): qty 1

## 2022-04-23 MED ORDER — METHYLPREDNISOLONE SODIUM SUCC 125 MG IJ SOLR
125.0000 mg | Freq: Once | INTRAMUSCULAR | Status: AC
  Administered 2022-04-23: 125 mg via INTRAVENOUS
  Filled 2022-04-23: qty 2

## 2022-04-23 MED ORDER — FUROSEMIDE 20 MG PO TABS
20.0000 mg | ORAL_TABLET | Freq: Every day | ORAL | Status: DC
  Administered 2022-04-23 – 2022-04-25 (×2): 20 mg via ORAL
  Filled 2022-04-23 (×3): qty 1

## 2022-04-23 MED ORDER — RISPERIDONE 1 MG PO TABS
1.0000 mg | ORAL_TABLET | Freq: Every day | ORAL | Status: DC
  Administered 2022-04-23 – 2022-04-24 (×2): 1 mg via ORAL
  Filled 2022-04-23 (×2): qty 1

## 2022-04-23 MED ORDER — PRESERVISION AREDS 2 PO CAPS: 1.0000 | ORAL_CAPSULE | Freq: Two times a day (BID) | ORAL | Status: DC

## 2022-04-23 MED ORDER — FLUTICASONE FUROATE-VILANTEROL 200-25 MCG/ACT IN AEPB
1.0000 | INHALATION_SPRAY | Freq: Every day | RESPIRATORY_TRACT | Status: DC
  Administered 2022-04-24 – 2022-04-25 (×2): 1 via RESPIRATORY_TRACT
  Filled 2022-04-23: qty 28

## 2022-04-23 MED ORDER — SODIUM CHLORIDE 0.9 % IV SOLN: 250.0000 mL | INTRAVENOUS | Status: DC | PRN

## 2022-04-23 MED ORDER — IPRATROPIUM-ALBUTEROL 0.5-2.5 (3) MG/3ML IN SOLN
3.0000 mL | Freq: Once | RESPIRATORY_TRACT | Status: AC
  Administered 2022-04-23: 3 mL via RESPIRATORY_TRACT
  Filled 2022-04-23: qty 3

## 2022-04-23 MED ORDER — FLUTICASONE PROPIONATE 50 MCG/ACT NA SUSP
2.0000 | Freq: Every day | NASAL | Status: DC
  Administered 2022-04-24 – 2022-04-25 (×2): 2 via NASAL
  Filled 2022-04-23: qty 16

## 2022-04-23 MED ORDER — PANTOPRAZOLE SODIUM 40 MG PO TBEC
40.0000 mg | DELAYED_RELEASE_TABLET | Freq: Every day | ORAL | Status: DC
  Administered 2022-04-23 – 2022-04-25 (×3): 40 mg via ORAL
  Filled 2022-04-23 (×3): qty 1

## 2022-04-23 MED ORDER — MIDODRINE HCL 5 MG PO TABS
5.0000 mg | ORAL_TABLET | Freq: Two times a day (BID) | ORAL | Status: DC
  Administered 2022-04-23 – 2022-04-25 (×5): 5 mg via ORAL
  Filled 2022-04-23 (×5): qty 1

## 2022-04-23 MED ORDER — MIRTAZAPINE 15 MG PO TABS
7.5000 mg | ORAL_TABLET | Freq: Every day | ORAL | Status: DC
  Administered 2022-04-23 – 2022-04-24 (×2): 7.5 mg via ORAL
  Filled 2022-04-23 (×2): qty 1

## 2022-04-23 MED ORDER — HEPARIN SODIUM (PORCINE) 5000 UNIT/ML IJ SOLN
5000.0000 [IU] | Freq: Three times a day (TID) | INTRAMUSCULAR | Status: DC
  Administered 2022-04-23 – 2022-04-25 (×6): 5000 [IU] via SUBCUTANEOUS
  Filled 2022-04-23 (×7): qty 1

## 2022-04-23 MED ORDER — ACETAMINOPHEN 650 MG RE SUPP: 650.0000 mg | Freq: Four times a day (QID) | RECTAL | Status: DC | PRN

## 2022-04-23 MED ORDER — ACETAMINOPHEN 325 MG PO TABS
650.0000 mg | ORAL_TABLET | Freq: Four times a day (QID) | ORAL | Status: DC | PRN
  Administered 2022-04-23: 650 mg via ORAL
  Filled 2022-04-23: qty 2

## 2022-04-23 NOTE — Progress Notes (Signed)
Patient sat went up to 95%, weaned down O2 to 2L, patient still at 95%.

## 2022-04-23 NOTE — H&P (Signed)
History and Physical    [name] NOM:[id number] DOB: November 01, 1940 DOA: 04/23/2022  PCP: Lubertha Sayres, FNP   Patient coming from: SNF  Chief Complaint: Shortness of breath/fall  HPI: [name] is a 82 y.o. 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  She apparently was having some trouble sleeping and rolled over falling out of her bed and EMS reports that she was hypoxemic with SpO2 in the 70th percentile.  She has a mild cough, but denies any shortness of breath or wheezing.   ED Course: Vital signs stable and patient afebrile.  Sodium 134 and creatinine 1.03.  Troponin 25 and BNP 883.  Influenza COVID swab negative.  Chest x-ray with some pulmonary vascular congestion noted.  Elbow and hip images with no acute fractures and CT of the head with no acute findings.  Patient was started on breathing treatment as well as some Solu-Medrol in the ED. She is currently on 2 L nasal cannula oxygen.  Review of Systems: Reviewed as noted above, otherwise negative.  Past Medical History:  Diagnosis Date   Anemia 1990   hx of   Anxiety    Arthritis 2004   Asthma 1995   uses inhaler   Chronic HFrEF (heart failure with reduced ejection fraction) ([place])    a. 07/2021 Echo: EF 25%; b. 01/2022 Echo: EF 30-35%, glob HK, GrI DD, nl RV fxn, mild-mod MR; c. 02/2022 cMRI: EF 23%.   CKD (chronic kidney disease), stage III ([place]) 2010   a. Solitary kidney - s/p L nephrectomy in 2010.   Colon polyp    Complication of anesthesia 1966   first surgery she had- had a hard time waking up from surgery   COPD (chronic obstructive pulmonary disease) ([place])    Delusional disorder ([place])    Six delusional disorder   Diverticulosis 2003   GERD (gastroesophageal reflux disease)    on meds   NICM  (nonischemic cardiomyopathy) ([place])    a. 07/2021 Echo: EF 25%; b. 11/2021 [facility]i PET/CT: EF 28% (35% w/ stress). Cor Ca2+. No ischemia; c.01/2022 Echo: EF 30-35%, glob HK, GrI DD, nl RV fxn, mild-mod MR; d. 02/2022 cMRI: EF 23%, diff HK, septal-lateral dyssynchrony w/ LBBB. Nonspecific RV insertion site LGE, borderline elev extracellular volume percentage sugg some replacement fibrosis-->NICM.   Schizophrenia ([place])    Severe left ventricular systolic dysfunction (LVSD)    EF 23% by cardiac MRI    Past Surgical History:  Procedure Laterality Date   APPENDECTOMY  1966   CATARACT EXTRACTION     Bilateral   CHOLECYSTECTOMY  2004   COLON SURGERY  2008   COLONOSCOPY  2013   JP-MAC-moviprep(good)-TA   COLOSTOMY     COLOSTOMY TAKEDOWN     DIAGNOSTIC LAPAROSCOPY     exploratory for endometriosis   HEEL SPUR SURGERY     Right, Metal Plate in [place]  2010   NEPHRECTOMY Left 9628   As complication of partial colectomy for diverticulitis   POLYPECTOMY  2013   TA   TONSILLECTOMY     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   Procedure: LEFT TOTAL KNEE ARTHROPLASTY;  Surgeon: Susa Day, MD;  Location: [facility];  Service: Orthopedics;  Laterality: Left;  Adductor Block     reports that  she has never smoked. She has never used smokeless tobacco. She reports that she does not currently use alcohol. She reports that she does not use drugs.  Allergies  Allergen Reactions   Haloperidol Anaphylaxis   Aripiprazole Other (See Comments)    Bad thoughts    Chlorpheniramine-Pse-Ibuprofen     Other reaction(s): Cough    Family History  Problem Relation Age of Onset   Hypertension Mother    Heart failure Mother    Hyperlipidemia Mother    Stroke Mother    Hypertension Father    Heart attack Father    COPD Father    Diabetes Paternal Aunt    Diabetes Paternal Grandmother    Colon polyps Other        niece   Stomach cancer Neg Hx    Rectal cancer Neg Hx    Esophageal  cancer Neg Hx    Colon cancer Neg Hx     Prior to Admission medications   Medication Sig Start Date End Date Taking? Authorizing Provider  acetaminophen (TYLENOL) 325 MG tablet Take 650 mg by mouth every 6 (six) hours as needed for mild pain, moderate pain or fever.    [provider]  amantadine (SYMMETREL) 100 MG capsule Take 100 mg by mouth 2 (two) times daily. 05/03/21   [provider]  antiseptic oral rinse (BIOTENE) LIQD 15 mLs by Mouth Rinse route See admin instructions. Use about 15 mL for 30 seconds then spit out up to 5 times daily as needed for dry cough and sore throat.    [provider]  BREO ELLIPTA 200-25 MCG/INH AEPB Inhale 1 puff into the lungs daily. 06/22/20   [provider]  Calcium Carb-Cholecalciferol (CALCIUM 600+D3 PO) Take 1 tablet by mouth daily.    [provider]  cetirizine (ZYRTEC) 10 MG tablet Take 10 mg by mouth daily.    [provider]  fluticasone (FLONASE) 50 MCG/ACT nasal spray Place 2 sprays into both nostrils daily. 05/02/21   [provider]  furosemide (LASIX) 20 MG tablet Take 1 tablet (20 mg total) by mouth daily. 03/13/22   Orson Eva, MD  gabapentin (NEURONTIN) 100 MG capsule Take 100 mg by mouth at bedtime.    [provider]  ibuprofen (ADVIL) 400 MG tablet Take 400 mg by mouth every 8 (eight) hours as needed for cramping, moderate pain or fever.    [provider]  Ipratropium-Albuterol (COMBIVENT RESPIMAT) 20-100 MCG/ACT AERS respimat Inhale 1 puff into the lungs every 6 (six) hours as needed for shortness of breath or wheezing.    [provider]  melatonin 3 MG TABS tablet Take 3 mg by mouth at bedtime.    [provider]  midodrine (PROAMATINE) 5 MG tablet Take 1 tablet (5 mg total) by mouth 2 (two) times daily with a meal. Her systolic blood pressure in the hospital is around 110 to 130 while taking midrodrine 45m bid, she may not need midodrine long  term, pcp to continue to monitor 02/02/22   XFlorencia Reasons MD  mirtazapine (REMERON) 7.5 MG tablet Take 1 tablet (7.5 mg total) by mouth at bedtime. 01/23/22   SThurnell Lose MD  Multiple Vitamins-Minerals (PRESERVISION AREDS 2) CAPS Take 1 capsule by mouth 2 (two) times daily with a meal.    [provider]  omeprazole (PRILOSEC) 20 MG capsule Take 1 capsule (20 mg total) by mouth daily. Patient taking differently: Take 20 mg by mouth 2 (two) times daily.  06/19/19   Palumbo, April, MD  risperiDONE (RISPERDAL) 1 MG tablet Take 1 tablet (1 mg total) by mouth at bedtime. 10/27/21   Johnson, Clanford L, MD  traZODone (DESYREL) 150 MG tablet Take by mouth at bedtime.    [provider]    Physical Exam: Vitals:   04/23/22 0031 04/23/22 0033 04/23/22 0038 04/23/22 0300  BP: 130/74   129/63  Pulse: 97   98  Resp: 20   20  Temp: 97.7 F (36.5 C)     TempSrc: Oral     SpO2: 93%  95% 96%  Weight:  82 kg    Height:  5' 2.5" (1.588 m)      Constitutional: NAD, calm, comfortable Vitals:   04/23/22 0031 04/23/22 0033 04/23/22 0038 04/23/22 0300  BP: 130/74   129/63  Pulse: 97   98  Resp: 20   20  Temp: 97.7 F (36.5 C)     TempSrc: Oral     SpO2: 93%  95% 96%  Weight:  82 kg    Height:  5' 2.5" (1.588 m)     Eyes: lids and conjunctivae normal Neck: normal, supple Respiratory: clear to auscultation bilaterally. Normal respiratory effort. No accessory muscle use.  2 L nasal cannula oxygen Cardiovascular: Regular rate and rhythm, no murmurs. Abdomen: no tenderness, no distention. Bowel sounds positive.  Musculoskeletal:  No edema. Skin: no rashes, lesions, ulcers.  Psychiatric: Flat affect  Labs on Admission: I have personally reviewed following labs and imaging studies  CBC: Recent Labs  Lab 04/23/22 0022  WBC 6.4  NEUTROABS 3.8  HGB 12.7  HCT 40.4  MCV 93.1  PLT 540   Basic Metabolic Panel: Recent Labs  Lab 04/23/22 0022  NA 134*  K 4.0  CL 101  CO2  26  GLUCOSE 123*  BUN 24*  CREATININE 1.03*  CALCIUM 8.3*   GFR: Estimated Creatinine Clearance: 43.7 mL/min (A) (by C-G formula based on SCr of 1.03 mg/dL (H)). Liver Function Tests: No results for input(s): "AST", "ALT", "ALKPHOS", "BILITOT", "PROT", "ALBUMIN" in the last 168 hours. No results for input(s): "LIPASE", "AMYLASE" in the last 168 hours. No results for input(s): "AMMONIA" in the last 168 hours. Coagulation Profile: No results for input(s): "INR", "PROTIME" in the last 168 hours. Cardiac Enzymes: No results for input(s): "CKTOTAL", "CKMB", "CKMBINDEX", "TROPONINI" in the last 168 hours. BNP (last 3 results) No results for input(s): "PROBNP" in the last 8760 hours. HbA1C: No results for input(s): "HGBA1C" in the last 72 hours. CBG: No results for input(s): "GLUCAP" in the last 168 hours. Lipid Profile: No results for input(s): "CHOL", "HDL", "LDLCALC", "TRIG", "CHOLHDL", "LDLDIRECT" in the last 72 hours. Thyroid Function Tests: No results for input(s): "TSH", "T4TOTAL", "FREET4", "T3FREE", "THYROIDAB" in the last 72 hours. Anemia Panel: No results for input(s): "VITAMINB12", "FOLATE", "FERRITIN", "TIBC", "IRON", "RETICCTPCT" in the last 72 hours. Urine analysis:    Component Value Date/Time   COLORURINE COLORLESS (A) 03/09/2022 0956   APPEARANCEUR CLEAR 03/09/2022 0956   APPEARANCEUR Clear 12/13/2021 0957   LABSPEC 1.005 03/09/2022 0956   PHURINE 5.0 03/09/2022 0956   GLUCOSEU NEGATIVE 03/09/2022 0956   HGBUR NEGATIVE 03/09/2022 0956   BILIRUBINUR NEGATIVE 03/09/2022 0956   BILIRUBINUR Negative 12/13/2021 0957   KETONESUR NEGATIVE 03/09/2022 0956   PROTEINUR NEGATIVE 03/09/2022 0956   UROBILINOGEN 0.2 10/29/2016 1541   UROBILINOGEN 0.2 07/21/2007 2218   NITRITE NEGATIVE 03/09/2022 0956   LEUKOCYTESUR TRACE (A) 03/09/2022 0956    Radiological Exams  on Admission: DG Elbow Complete Right  Result Date: 04/23/2022 CLINICAL DATA:  fall, R elbow pain EXAM:  RIGHT ELBOW - COMPLETE 3+ VIEW COMPARISON:  None Available. FINDINGS: There is no evidence of fracture, dislocation, or joint effusion. Mild degenerative changes of the elbow. Soft tissues are unremarkable. IMPRESSION: No acute displaced fracture or dislocation. Electronically Signed   By: Iven Finn M.D.   On: 04/23/2022 03:52   DG Hip Unilat With Pelvis 2-3 Views Right  Result Date: 04/23/2022 CLINICAL DATA:  SOB, wheezing, history of COPD and CHF; fell out of bed, R hip pain EXAM: DG HIP (WITH OR WITHOUT PELVIS) 2-3V RIGHT COMPARISON:  X-ray pelvis 04/29/2021 FINDINGS: There is no evidence of hip fracture or dislocation of the right hip. Frontal view of the left hip unremarkable. No acute displaced fracture or diastasis of the bones of the pelvis. There is no evidence of arthropathy or other focal bone abnormality. IMPRESSION: Negative. Electronically Signed   By: Iven Finn M.D.   On: 04/23/2022 01:38   DG Chest Port 1 View  Result Date: 04/23/2022 CLINICAL DATA:  SOB, wheezing, history of COPD and CHF; fell out of bed, R hip pain EXAM: PORTABLE CHEST 1 VIEW COMPARISON:  CT renal 02/01/2022, chest x-ray 03/09/2022 FINDINGS: The heart and mediastinal contours are within normal limits. Prominent hilar vasculature. Aortic calcification No focal consolidation. No pulmonary edema. Persistent small left pleural effusion. No pneumothorax. No acute osseous abnormality. IMPRESSION: 1. Central venous congestion with persistent small left pleural effusion. 2.  Aortic Atherosclerosis (ICD10-I70.0). Electronically Signed   By: Iven Finn M.D.   On: 04/23/2022 01:34   CT Head Wo Contrast  Result Date: 04/23/2022 CLINICAL DATA:  Head trauma, minor (Age >= 65y) fell out of bed, recent SDH EXAM: CT HEAD WITHOUT CONTRAST TECHNIQUE: Contiguous axial images were obtained from the base of the skull through the vertex without intravenous contrast. RADIATION DOSE REDUCTION: This exam was performed according  to the departmental dose-optimization program which includes automated exposure control, adjustment of the mA and/or kV according to patient size and/or use of iterative reconstruction technique. COMPARISON:  01/29/2022 FINDINGS: Brain: There is atrophy and chronic small vessel disease changes. No acute intracranial abnormality. Specifically, no hemorrhage, hydrocephalus, mass lesion, acute infarction, or significant intracranial injury. Vascular: No hyperdense vessel or unexpected calcification. Skull: No acute calvarial abnormality. Sinuses/Orbits: No acute findings Other: None IMPRESSION: Atrophy, chronic microvascular disease. No acute intracranial abnormality. Electronically Signed   By: Rolm Baptise M.D.   On: 04/23/2022 01:08    EKG: Independently reviewed. ST 102bpm.  Assessment/Plan Principal Problem:   Acute respiratory failure with hypoxia (HCC) Active Problems:   Obesity    Anxiety   Gastroesophageal reflux disease without esophagitis   Essential hypertension   Paranoid schizophrenia (HCC)   Severe manic bipolar 1 disorder with psychotic behavior ([place])   Hyponatremia   Fall at home, initial encounter    Mild hypoxemia with fall -Wean oxygen with treatment as outlined below -Place in observation through today and wean to room air -PT evaluation due to fall/debility  Chronic systolic CHF exacerbation -03/06/2022 cardiac MRI--diffuse HK with LBBB, LVEF 23%, RVEF of 46%, study consistent with NICM -01/30/22 Echo EF 30-35%, G1DD, mild-mod MR -Lasix 20 mg IV twice daily -Baseline weight near 179 pounds, currently at baseline at 180 -BNP elevated, but near her baseline  COPD -Does not appear to be in acute exacerbation -DuoNebs as needed for wheezing or shortness of breath -Wean oxygen to baseline  as tolerated -Received IV Solu-Medrol in ED  Paranoid schizophrenia -Continue amantadine, risperidone, and valbenazine  CKD stage IIIa -Baseline creatinine 0.9-1.2 -Monitor  with diuresis  History of SIADH with chronic hyponatremia -Sodium 134 -Maintain on fluid restriction 1.2 L -Continue to monitor labs  Chronic hypotension -Midodrine 5 mg twice daily -Monitor carefully with diuresis  GERD -PPI  Obesity BMI 32.66   DVT prophylaxis: Heparin Code Status: Full Family Communication: None at bedside Disposition Plan: Observation for hypoxemia and fall Consults called: None Admission status: Observation, telemetry  Severity of Illness: The appropriate patient status for this patient is OBSERVATION. Observation status is judged to be reasonable and necessary in order to provide the required intensity of service to ensure the patient's safety. The patient's presenting symptoms, physical exam findings, and initial radiographic and laboratory data in the context of their medical condition is felt to place them at decreased risk for further clinical deterioration. Furthermore, it is anticipated that the patient will be medically stable for discharge from the hospital within 2 midnights of admission.    Toa Mia D Manuella Ghazi DO [facility]  If 7PM-7AM, please contact night-coverage [email or web address]  04/23/2022, 8:00 AM

## 2022-04-23 NOTE — ED Notes (Addendum)
ED Provider at bedside. EDP trialed Pt on room air. Pts O2 Sats dropped to 74% on RA.  EDP reapplied 3L O2 via Nasal Cannula and Pts O2 Sats improved to 94%.

## 2022-04-23 NOTE — ED Provider Notes (Signed)
[facility] EMERGENCY DEPARTMENT  Provider Note  CSN: [id number] Arrival date & time: 04/23/22 0016  History Chief Complaint  Patient presents with   Shortness of Breath    [name] is a 82 y.o. female with history of COPD and CHF brought to the ED via EMS from SNF where she was found on the floor next to her bed. She reports she was having trouble sleeping and rolled over, falling out of bed and landing on the floor. EMS reports her bed is only about 18-24" off the floor. She was noted to be wheezing and hypoxic with reported SpO2 in the 70s on the scene. Given albuterol nebulizer with some improvement enroute. She is unsure about head injury. Complaining of mild R hip pain. She has had admissions in recent months for fall with SDH managed conservatively as well as for CHF exacerbation.    Home Medications Prior to Admission medications   Medication Sig Start Date End Date Taking? Authorizing Provider  acetaminophen (TYLENOL) 325 MG tablet Take 650 mg by mouth every 6 (six) hours as needed for mild pain, moderate pain or fever.    [provider]  amantadine (SYMMETREL) 100 MG capsule Take 100 mg by mouth 2 (two) times daily. 05/03/21   [provider]  antiseptic oral rinse (BIOTENE) LIQD 15 mLs by Mouth Rinse route See admin instructions. Use about 15 mL for 30 seconds then spit out up to 5 times daily as needed for dry cough and sore throat.    [provider]  BREO ELLIPTA 200-25 MCG/INH AEPB Inhale 1 puff into the lungs daily. 06/22/20   [provider]  Calcium Carb-Cholecalciferol (CALCIUM 600+D3 PO) Take 1 tablet by mouth daily.    [provider]  cetirizine (ZYRTEC) 10 MG tablet Take 10 mg by mouth daily.    [provider]  fluticasone (FLONASE) 50 MCG/ACT nasal spray Place 2 sprays into both nostrils daily. 05/02/21   [provider]  furosemide (LASIX) 20 MG tablet Take 1 tablet (20 mg total) by mouth daily.  03/13/22   Orson Eva, MD  gabapentin (NEURONTIN) 100 MG capsule Take 100 mg by mouth at bedtime.    [provider]  ibuprofen (ADVIL) 400 MG tablet Take 400 mg by mouth every 8 (eight) hours as needed for cramping, moderate pain or fever.    [provider]  Ipratropium-Albuterol (COMBIVENT RESPIMAT) 20-100 MCG/ACT AERS respimat Inhale 1 puff into the lungs every 6 (six) hours as needed for shortness of breath or wheezing.    [provider]  melatonin 3 MG TABS tablet Take 3 mg by mouth at bedtime.    [provider]  midodrine (PROAMATINE) 5 MG tablet Take 1 tablet (5 mg total) by mouth 2 (two) times daily with a meal. Her systolic blood pressure in the hospital is around 110 to 130 while taking midrodrine '5mg'$  bid, she may not need midodrine long term, pcp to continue to monitor 02/02/22   Florencia Reasons, MD  mirtazapine (REMERON) 7.5 MG tablet Take 1 tablet (7.5 mg total) by mouth at bedtime. 01/23/22   Thurnell Lose, MD  Multiple Vitamins-Minerals (PRESERVISION AREDS 2) CAPS Take 1 capsule by mouth 2 (two) times daily with a meal.    [provider]  omeprazole (PRILOSEC) 20 MG capsule Take 1 capsule (20 mg total) by mouth daily. Patient taking differently: Take 20 mg by mouth 2 (two) times daily. 06/19/19   Palumbo, April, MD  risperiDONE (RISPERDAL) 1 MG tablet Take 1 tablet (1 mg total) by mouth at bedtime. 10/27/21   Johnson, Clanford L, MD  traZODone (DESYREL) 150 MG tablet Take by mouth at bedtime.    [provider]     Allergies    Haloperidol, Aripiprazole, and Chlorpheniramine-pse-ibuprofen   Review of Systems   Review of Systems Please see HPI for pertinent positives and negatives  Physical Exam BP 130/74   Pulse 97   Temp 97.7 F (36.5 C) (Oral)   Resp 20   Ht 5' 2.5" (1.588 m)   Wt 82 kg   SpO2 95%   BMI 32.54 kg/m   Physical Exam Vitals and nursing note reviewed.  Constitutional:      Appearance: Normal  appearance.  HENT:     Head: Normocephalic and atraumatic.     Nose: Nose normal.     Mouth/Throat:     Mouth: Mucous membranes are moist.  Eyes:     Extraocular Movements: Extraocular movements intact.     Conjunctiva/sclera: Conjunctivae normal.  Cardiovascular:     Rate and Rhythm: Normal rate.  Pulmonary:     Effort: Pulmonary effort is normal.     Breath sounds: Wheezing present.  Abdominal:     General: Abdomen is flat.     Palpations: Abdomen is soft.     Tenderness: There is no abdominal tenderness.  Musculoskeletal:        General: No swelling. Normal range of motion.     Cervical back: Neck supple.     Right lower leg: No edema.     Left lower leg: Tenderness (R hip, no shortening or rotation) present. No edema.  Skin:    General: Skin is warm and dry.  Neurological:     General: No focal deficit present.     Mental Status: She is alert.  Psychiatric:        Mood and Affect: Mood normal.     ED Results / Procedures / Treatments   EKG EKG Interpretation  Date/Time:  Monday April 23 2022 00:23:39 EST Ventricular Rate:  102 PR Interval:  183 QRS Duration: 144 QT Interval:  397 QTC Calculation: 518 R Axis:   -25 Text Interpretation: Sinus tachycardia IVCD, consider atypical LBBB Artifact No significant change since last tracing Confirmed by Calvert Cantor [phone]) on 04/23/2022 12:33:19 AM  Procedures .Critical Care  Performed by: Truddie Hidden, MD Authorized by: Truddie Hidden, MD   Critical care provider statement:    Critical care time (minutes):  60   Critical care time was exclusive of:  Separately billable procedures and treating other patients   Critical care was necessary to treat or prevent imminent or life-threatening deterioration of the following conditions:  Respiratory failure   Critical care was time spent personally by me on the following activities:  Development of treatment plan with patient or surrogate, discussions with  consultants, evaluation of patient's response to treatment, examination of patient, ordering and review of laboratory studies, ordering and review of radiographic studies, ordering and performing treatments and interventions, pulse oximetry, re-evaluation of patient's condition and review of old charts   Care discussed with: admitting provider     Medications Ordered in the ED Medications  ipratropium-albuterol (DUONEB) 0.5-2.5 (3) MG/3ML nebulizer solution 3 mL (3 mLs Nebulization Given 04/23/22 0038)  methylPREDNISolone sodium succinate (SOLU-MEDROL) 125 mg/2 mL injection 125 mg (125 mg Intravenous Given 04/23/22 0042)    Initial Impression and Plan  Patient here via EMS  after falling out of bed, will check CT head given recent SDH as well as R hip as the location of current pain. She was also noted to be hypoxic and wheezing on scene. SpO2 100% on the neb on arrival but quickly dropped to 89% on RA, placed back on supplemental oxygen. Will give additional nebs, solumedrol. Check labs and CXR.   ED Course   Clinical Course as of 04/23/22 0358  Mon Apr 23, 2022  0103 CBC is normal.  [CS]  0112 I personally viewed the images from radiology studies and agree with radiologist interpretation: CT neg for acute injury [CS]  0120 BMP and Trop both at baseline. Patient appears more comfortable now on supplemental oxygen.  [CS]  0132 BNP is moderately elevated, improved from prior admission.  [CS]  0220 Wheezing improved. SpO2 94% on 2L [place] oxygen. Will add Covid/Flu/RSV swab and reassess oxygenation on room air prior to dispo decision. Repeat Trop is pending.  [CS]  8592 No significant change in delta trop.  [CS]  9244 Covid/Flu/RSV swab is negative.  [CS]  6286 Patient's SpO2 drops to the 70s when taken off oxygen, placed back on 3L [place]. She is now also complaining of R elbow pain, will add xray. Plan admission for further evaluation of COPD exacerbation with hypoxic respiratory failure.  [CS]  [phone]  Spoke with Dr. Josephine Cables, Hospitalist, who will evaluate for admission.  [CS]    Clinical Course User Index [CS] Truddie Hidden, MD     MDM Rules/Calculators/A&P Medical Decision Making Problems Addressed: COPD exacerbation [facility]): chronic illness or injury with exacerbation, progression, or side effects of treatment Fall, initial encounter: acute illness or injury Hypoxia: acute illness or injury  Amount and/or Complexity of Data Reviewed Labs: ordered. Decision-making details documented in ED Course. Radiology: ordered and independent interpretation performed.  Risk Prescription drug management. Decision regarding hospitalization.    Final Clinical Impression(s) / ED Diagnoses Final diagnoses:  COPD exacerbation ([place])  Hypoxia  Fall, initial encounter    Rx / DC Orders ED Discharge Orders     None        Truddie Hidden, MD 04/23/22 [phone]

## 2022-04-23 NOTE — Plan of Care (Signed)
  Problem: Acute Rehab PT Goals(only PT should resolve) Goal: Pt Will Go Supine/Side To Sit Outcome: Progressing Flowsheets (Taken 04/23/2022 1123) Pt will go Supine/Side to Sit:  with modified independence  with supervision Goal: Patient Will Transfer Sit To/From Stand Outcome: Progressing Flowsheets (Taken 04/23/2022 1123) Patient will transfer sit to/from stand:  with modified independence  with supervision Goal: Pt Will Transfer Bed To Chair/Chair To Bed Outcome: Progressing Flowsheets (Taken 04/23/2022 1123) Pt will Transfer Bed to Chair/Chair to Bed:  with modified independence  with supervision Goal: Pt Will Ambulate Outcome: Progressing Flowsheets (Taken 04/23/2022 1123) Pt will Ambulate:  100 feet  with modified independence  with supervision  with rolling walker   11:24 AM, 04/23/22 Lonell Grandchild, MPT Physical Therapist with [facility] [phone] office [phone] mobile phone

## 2022-04-23 NOTE — ED Triage Notes (Signed)
Pt arrived via [facility] from [facility] who report being called out for a fall. Pt found on floor at the facility with minor skin abrasion to right elbow. Pt denying pain at this time. [facility] report Pt was found hypoxic at 70% on RA and was actively wheezing. [facility] initiated IV access and administered 1 Duoneb Trx PTA. Pt now 88% on RA in Triage. RT at bedside and initiated 3L [place] and improved O2 Sats to 93%. Pt reports not using O2 at the SNF.

## 2022-04-23 NOTE — ED Notes (Signed)
Patient transported to CT 

## 2022-04-23 NOTE — Evaluation (Signed)
Physical Therapy Evaluation Patient Details Name: [name] MRN: [id number] DOB: 05-Mar-1941 Today's Date: 04/23/2022  History of Present Illness  [name] is a 82 y.o. 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  She apparently was having some trouble sleeping and rolled over falling out of her bed and EMS reports that she was hypoxemic with SpO2 in the 70th percentile.  She has a mild cough, but denies any shortness of breath or wheezing.   Clinical Impression  Patient demonstrates good return for sitting up at bedside with [facility] raised, transferring to chair in room and to/from commode in bathroom and tolerated ambulating in hallway without loss of balance.  Patient limited for gait training mostly due to fatigue and mild SOB with SpO2 dropping from 92% to 88%.  Patient tolerated sitting up in chair after therapy - nurse notified.  Patient will benefit from continued skilled physical therapy in hospital and recommended venue below to increase strength, balance, endurance for safe ADLs and gait.         Recommendations for follow up therapy are one component of a multi-disciplinary discharge planning process, led by the attending physician.  Recommendations may be updated based on patient status, additional functional criteria and insurance authorization.  Follow Up Recommendations Home health PT      Assistance Recommended at Discharge Set up Supervision/Assistance  Patient can return home with the following  A little help with walking and/or transfers;A little help with bathing/dressing/bathroom;Assistance with cooking/housework;Help with stairs or ramp for entrance    Equipment Recommendations None recommended by PT  Recommendations for Other Services        Functional Status Assessment Patient has had a recent decline in their functional status and demonstrates the ability to make significant improvements in function in a reasonable and predictable amount of time.     Precautions / Restrictions Precautions Precautions: Fall Restrictions Weight Bearing Restrictions: No      Mobility  Bed Mobility Overal bed mobility: Needs Assistance Bed Mobility: Supine to Sit     Supine to sit: Supervision     General bed mobility comments: slightly labored movement with HOB raised    Transfers Overall transfer level: Needs assistance Equipment used: Rolling walker (2 wheels) Transfers: Sit to/from Stand, Bed to chair/wheelchair/BSC Sit to Stand: Min guard, Supervision   Step pivot transfers: Min guard, Supervision       General transfer comment: increased time, labored movement    Ambulation/Gait Ambulation/Gait assistance: Supervision, Min guard Gait Distance (Feet): 50 Feet Assistive device: Rolling walker (2 wheels) Gait Pattern/deviations: Decreased step length - right, Decreased step length - left, Decreased stride length Gait velocity: decreased     General Gait Details: slightly labored cadence without loss of balance, limited mostly due to c/o fatigue  Stairs            Wheelchair Mobility    Modified Rankin (Stroke Patients Only)       Balance Overall balance assessment: Needs assistance Sitting-balance support: Feet unsupported, No upper extremity supported Sitting balance-Leahy Scale: Fair Sitting balance - Comments: fair/good seated at EOB   Standing balance support: During functional activity, Bilateral upper extremity supported Standing balance-Leahy Scale: Fair Standing balance comment: fair/good using RW  Pertinent Vitals/Pain Pain Assessment Pain Assessment: No/denies pain    Home Living Family/patient expects to be discharged to:: Assisted living                  Home Equipment: Rollator (4 wheels)      Prior Function Prior Level of Function : Needs assist       Physical Assist : Mobility (physical);ADLs (physical) Mobility (physical): Bed mobility;Transfers;Gait;Stairs   Mobility Comments: Merchant navy officer ADLs Comments: assisted by ALF staff     Hand Dominance   Dominant Hand: Right    Extremity/Trunk Assessment   Upper Extremity Assessment Upper Extremity Assessment: Overall WFL for tasks assessed    Lower Extremity Assessment Lower Extremity Assessment: Generalized weakness    Cervical / Trunk Assessment Cervical / Trunk Assessment: Normal  Communication   Communication: No difficulties  Cognition Arousal/Alertness: Awake/alert Behavior During Therapy: WFL for tasks assessed/performed Overall Cognitive Status: Within Functional Limits for tasks assessed                                          General Comments      Exercises     Assessment/Plan    PT Assessment Patient needs continued PT services  PT Problem List Decreased strength;Decreased activity tolerance;Decreased balance;Decreased mobility       PT Treatment Interventions DME instruction;Gait training;Stair training;Functional mobility training;Therapeutic activities;Therapeutic exercise;Balance training;Patient/family education    PT Goals (Current goals can be found in the Care Plan section)  Acute Rehab PT Goals Patient Stated Goal: return to ALF with staff to assist PT Goal Formulation: With patient Time For Goal Achievement: 04/27/22 Potential to Achieve Goals: Good    Frequency Min 3X/week     Co-evaluation               AM-PAC PT "6 Clicks" Mobility  Outcome Measure Help needed turning from your back to your side while in a flat bed without using bedrails?: A Little Help needed moving from lying on your back to sitting on the side of a flat bed without using bedrails?: A  Little Help needed moving to and from a bed to a chair (including a wheelchair)?: A Little Help needed standing up from a chair using your arms (e.g., wheelchair or bedside chair)?: A Little Help needed to walk in hospital room?: A Little Help needed climbing 3-5 steps with a railing? : A Lot 6 Click Score: 17    End of Session Equipment Utilized During Treatment: Oxygen Activity Tolerance: Patient tolerated treatment well;Patient limited by fatigue Patient left: in chair;with call bell/phone within reach Nurse Communication: Mobility status PT Visit Diagnosis: Unsteadiness on feet (R26.81);Other abnormalities of gait and mobility (R26.89);Muscle weakness (generalized) (M62.81);History of falling (Z91.81)    Time: 7035-0093 PT Time Calculation (min) (ACUTE ONLY): 30 min   Charges:   PT Evaluation $PT Eval Moderate Complexity: 1 Mod PT Treatments $Therapeutic Activity: 23-37 mins        11:22 AM, 04/23/22 Lonell Grandchild, MPT Physical Therapist with [facility] [phone] office [phone] mobile phone

## 2022-04-24 DIAGNOSIS — J9601 Acute respiratory failure with hypoxia: Secondary | ICD-10-CM | POA: Diagnosis not present

## 2022-04-24 LAB — CBC
HCT: 36.3 % (ref 36.0–46.0)
Hemoglobin: 11.3 g/dL — ABNORMAL LOW (ref 12.0–15.0)
MCH: 29.2 pg (ref 26.0–34.0)
MCHC: 31.1 g/dL (ref 30.0–36.0)
MCV: 93.8 fL (ref 80.0–100.0)
Platelets: 195 10*3/uL (ref 150–400)
RBC: 3.87 MIL/uL (ref 3.87–5.11)
RDW: 14.3 % (ref 11.5–15.5)
WBC: 7.3 10*3/uL (ref 4.0–10.5)
nRBC: 0 % (ref 0.0–0.2)

## 2022-04-24 LAB — BASIC METABOLIC PANEL
Anion gap: 7 (ref 5–15)
BUN: 23 mg/dL (ref 8–23)
CO2: 28 mmol/L (ref 22–32)
Calcium: 8.3 mg/dL — ABNORMAL LOW (ref 8.9–10.3)
Chloride: 99 mmol/L (ref 98–111)
Creatinine, Ser: 1.03 mg/dL — ABNORMAL HIGH (ref 0.44–1.00)
GFR, Estimated: 55 mL/min — ABNORMAL LOW (ref >60)
Glucose, Bld: 95 mg/dL (ref 70–99)
Potassium: 4 mmol/L (ref 3.5–5.1)
Sodium: 134 mmol/L — ABNORMAL LOW (ref 135–145)

## 2022-04-24 LAB — MAGNESIUM: Magnesium: 2.1 mg/dL (ref 1.7–2.4)

## 2022-04-24 NOTE — Progress Notes (Addendum)
While sleeping, pt desats to 74% O2.  Placed [place] on 2 liters now at 97% while awake.  Notified MD.  Plans to do ambulation test.

## 2022-04-24 NOTE — Care Management Obs Status (Signed)
MEDICARE OBSERVATION STATUS NOTIFICATION   Patient Details  Name: [name] MRN: [id number] Date of Birth: 1940-05-26   Medicare Observation Status Notification Given:  Yes    Tommy Medal 04/24/2022, 2:40 PM

## 2022-04-24 NOTE — TOC Initial Note (Addendum)
Transition of Care [facility]) - Initial/Assessment Note    Patient Details  Name: [name] DOBIS MRN: [id number] Date of Birth: [birth year]  Transition of Care [facility]) CM/SW Contact:    Iona Beard, [place] Phone Number: 04/24/2022, 2:48 PM  Clinical Narrative:                 Pt arrived to hospital from [facility]. CSW spoke to facility who state pt can return when medically ready. Facility uses [employer] for [employer], CSW to reach out to [employer] as pt is recommended for [facility] PT. CSW updated that pt will need home O2 set up prior to D/C. CSW awaiting sat qual note to be completed by nursing staff. O2 will have to be ordered and have confirmed delivery to facility before transportation can be set up. Updated Fl2 started. TOC to follow.   Addendum: CSW updated that SAT qual has been entered. CSW provided [place] with [employer] with the home O2 referral. CSW sent [facility] PT referral to Santiago Glad with [facility], CSW awaiting response. TOC to follow.   CSW updated that [facility] is active with [facility] PT/RN services. Pt will need new orders at D/C. TOC to follow.   Expected Discharge Plan: Assisted Living Barriers to Discharge: Continued Medical Work up   Patient Goals and CMS Choice Patient states their goals for this hospitalization and ongoing recovery are:: return to ALF CMS Medicare.gov Compare Post Acute Care list provided to:: Legal Guardian Choice offered to / list presented to : [facility] POA / Guardian      Expected Discharge Plan and Services In-house Referral: Clinical Social Work Discharge Planning Services: CM Consult Post Acute Care Choice: [place] arrangements for the past 2 months: [place]                                      Prior Living Arrangements/Services Living arrangements for the past 2 months: [place] Lives with:: Facility Resident Patient language and need for interpreter reviewed:: Yes Do you feel safe going back to the  place where you live?: Yes      Need for Family Participation in Patient Care: Yes (Comment) Care giver support system in place?: Yes (comment)   Criminal Activity/Legal Involvement Pertinent to Current Situation/Hospitalization: No - Comment as needed  Activities of Daily Living Home Assistive Devices/Equipment: [place] Rile (specify type) (four wheels) ADL Screening (condition at time of admission) Patient's cognitive ability adequate to safely complete daily activities?: Yes Is the patient deaf or have difficulty hearing?: No Does the patient have difficulty seeing, even when wearing glasses/contacts?: No Does the patient have difficulty concentrating, remembering, or making decisions?: No Patient able to express need for assistance with ADLs?: Yes Does the patient have difficulty dressing or bathing?: Yes Independently performs ADLs?: No Communication: Independent Dressing (OT): Independent Grooming: Needs assistance Is this a change from baseline?: Pre-admission baseline Feeding: Independent Bathing: Needs assistance Is this a change from baseline?: Pre-admission baseline Toileting: Independent In/Out Bed: Independent Walks in Home: Needs assistance Is this a change from baseline?: Pre-admission baseline Does the patient have difficulty walking or climbing stairs?: Yes Weakness of Legs: Both Weakness of Arms/Hands: None  Permission Sought/Granted                  Emotional Assessment         Alcohol / Substance Use: Not Applicable Psych Involvement: No (  comment)  Admission diagnosis:  Acute exacerbation of chronic obstructive pulmonary disease (COPD) ([place]) [J44.1] Hypoxia [R09.02] COPD exacerbation ([place]) [J44.1] Fall, initial encounter [W19.XXXA] Patient Active Problem List   Diagnosis Date Noted   Acute exacerbation of chronic obstructive pulmonary disease (COPD) ([place]) 04/23/2022   Acute on chronic systolic CHF (congestive heart failure) ([place]) 03/09/2022   Acute  respiratory failure with hypoxia ([place]) 03/09/2022   Chronic kidney disease, stage 3a ([place]) 03/09/2022   Severe left ventricular systolic dysfunction (LVSD) 03/07/2022   SDH (subdural hematoma) ([place]) 01/30/2022   Subdural hematoma ([place]) 01/29/2022   Fall at home, initial encounter 01/29/2022   Leukocytosis 01/29/2022   SIADH (syndrome of inappropriate ADH production) ([place]) 36/62/9476   Systolic heart failure ([place]) 01/16/2022   CKD (chronic kidney disease), stage II 01/16/2022   Hyponatremia 01/15/2022   Pressure injury of skin 01/15/2022   hyperthyroidism  54/65/0354   Acute metabolic encephalopathy - RESOLVED 10/24/2021   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   Elevated troponin 10/24/2021   Pain due to onychomycosis of toenails of both feet 09/12/2020   Chronic low back pain without sciatica 10/02/2017   Essential hypertension 10/02/2017   Paranoid schizophrenia ([place]) 09/19/2017   Delusional disorder, 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[place]) 09/18/2017   Behavioral change 09/18/2017   Severe manic bipolar 1 disorder with psychotic behavior ([place]) 08/01/2017   History of left inguinal hernia repair 08/06/2016   H/O total knee replacement, left 06/19/2016   Primary osteoarthritis of left knee 04/04/2016   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/2016   Anxiety 11/15/2015   Gastroesophageal reflux disease without esophagitis 11/15/2015   Solitary right kidney 07/07/2015   Urge incontinence of urine 07/07/2015   Asthma 12/07/2011   Arthritis 12/07/2011   Thoracic spondylosis - mild to moderate 2009 12/07/2011   Obesity  12/07/2011   Incisional hernia 03/29/2011   DIVERTICULOSIS-COLON 12/01/2008   ABDOMINAL PAIN-RLQ 12/01/2008   PCP:  Lubertha Sayres, FNP Pharmacy:   [id number] [place] [place] [address]Phone: [phone]: [phone] [place] [address]  [address]Phone: [phone]: [phone] [facility] [address]Phone: [phone]: [phone]    Social Determinants of Health (SDOH) Social History: SDOH Screenings   Food Insecurity: No Food Insecurity (04/23/2022)  Housing: Low Risk  (04/23/2022)  Transportation Needs: No Transportation Needs (04/23/2022)  Utilities: Not At Risk (04/23/2022)  Tobacco Use: Low Risk  (04/23/2022)   SDOH Interventions:     Readmission Risk Interventions     No data to display

## 2022-04-24 NOTE — Progress Notes (Addendum)
SATURATION QUALIFICATIONS: (This note is used to comply with regulatory documentation for home oxygen)  Patient Saturations on Room Air at Rest = 93%  Patient Saturations on Room Air while Ambulating = 88%  Patient Saturations on 2 Liters of oxygen while Ambulating = 92%  Please briefly explain why patient needs home oxygen:Pt desats while ambulating and desats while sleeping.

## 2022-04-24 NOTE — Progress Notes (Signed)
PROGRESS NOTE    [name]  NAT:[id number] DOB: 02/03/1941 DOA: 04/23/2022 PCP: Lubertha Sayres, FNP   Brief Narrative:    [name] is a 82 y.o. 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  Upon EMS arrival, she was noted to be hypoxemic in the 70th percentile and therefore brought to the ED for further evaluation and workup.  She started to have some chronic hypoxemia issues that appears unrelated to CHF or COPD exacerbation.  Assessment & Plan:   Principal Problem:   Acute respiratory failure with hypoxia (HCC) Active Problems:   Obesity    Anxiety   Gastroesophageal reflux disease without esophagitis   Essential hypertension   Paranoid schizophrenia (HCC)   Severe manic bipolar 1 disorder with psychotic behavior ([place])   Hyponatremia   Fall at home, initial encounter  Assessment and Plan:  Mild hypoxemia with fall -Wean oxygen with treatment as outlined below -Place in observation through today and wean to room air -PT evaluation due to fall/debility with recommendations for home health services -Need to set up outpatient home oxygen with O2 sat qualification note pending   Chronic systolic CHF exacerbation -03/06/2022 cardiac MRI--diffuse HK with LBBB, LVEF 23%, RVEF of 46%, study consistent with NICM -01/30/22 Echo EF 30-35%, G1DD, mild-mod MR -Lasix 20 mg IV twice daily -Baseline weight near 179 pounds, currently at baseline at 180 -BNP elevated, but near her baseline   COPD -Does not appear to be in acute exacerbation -DuoNebs as needed for wheezing or shortness of breath -Wean oxygen to baseline as tolerated -Received IV Solu-Medrol in ED   Paranoid schizophrenia -Continue amantadine, risperidone, and valbenazine   CKD stage IIIa -Baseline  creatinine 0.9-1.2 -Monitor with diuresis   History of SIADH with chronic hyponatremia -Sodium 134 -Maintain on fluid restriction 1.2 L -Continue to monitor labs   Chronic hypotension -Midodrine 5 mg twice daily -Monitor carefully with diuresis   GERD -PPI   Obesity BMI 32.66   DVT prophylaxis: Heparin Code Status: Full Family Communication: Discussed with niece, legal guardian 1/15, left voice message 1/16 Disposition Plan:  Status is: Observation The patient remains OBS appropriate and will d/c before 2 midnights.   Consultants:  None  Procedures:  None  Antimicrobials:  None   Subjective: Patient seen and evaluated today with no new acute complaints or concerns. No acute concerns or events noted overnight.  Objective: Vitals:   04/24/22 0232 04/24/22 0539 04/24/22 0809 04/24/22 0953  BP: (!) 105/52 (!) 94/53  (!) 88/40  Pulse: 95 87  76  Resp: 16 16    Temp: 98.6 F (37 C) 98.4 F (36.9 C)    TempSrc: Oral Oral    SpO2: 93% 98% 95% 93%  Weight:      Height:        Intake/Output Summary (Last 24 hours) at 04/24/2022 1408 Last data filed at 04/24/2022 0552 Gross per 24 hour  Intake --  Output 450 ml  Net -450 ml   Filed Weights   04/23/22 0033 04/23/22 1834  Weight: 82 kg 83.5 kg    Examination:  General exam: Appears calm and comfortable  Respiratory system: Clear to auscultation. Respiratory effort normal.  Currently on nasal cannula oxygen. Cardiovascular system: S1 & S2 heard, RRR.  Gastrointestinal system: Abdomen is soft  Central nervous system: Alert and awake Extremities: No edema Skin: No significant lesions noted Psychiatry: Flat affect.    Data Reviewed: I have personally reviewed following labs and imaging studies  CBC: Recent Labs  Lab 04/23/22 0022 04/24/22 0510  WBC 6.4 7.3  NEUTROABS 3.8  --   HGB 12.7 11.3*  HCT 40.4 36.3  MCV 93.1 93.8  PLT 214 734   Basic Metabolic Panel: Recent Labs  Lab 04/23/22 0022  04/24/22 0510  NA 134* 134*  K 4.0 4.0  CL 101 99  CO2 26 28  GLUCOSE 123* 95  BUN 24* 23  CREATININE 1.03* 1.03*  CALCIUM 8.3* 8.3*  MG  --  2.1   GFR: Estimated Creatinine Clearance: 44.2 mL/min (A) (by C-G formula based on SCr of 1.03 mg/dL (H)). Liver Function Tests: No results for input(s): "AST", "ALT", "ALKPHOS", "BILITOT", "PROT", "ALBUMIN" in the last 168 hours. No results for input(s): "LIPASE", "AMYLASE" in the last 168 hours. No results for input(s): "AMMONIA" in the last 168 hours. Coagulation Profile: No results for input(s): "INR", "PROTIME" in the last 168 hours. Cardiac Enzymes: No results for input(s): "CKTOTAL", "CKMB", "CKMBINDEX", "TROPONINI" in the last 168 hours. BNP (last 3 results) No results for input(s): "PROBNP" in the last 8760 hours. HbA1C: No results for input(s): "HGBA1C" in the last 72 hours. CBG: No results for input(s): "GLUCAP" in the last 168 hours. Lipid Profile: No results for input(s): "CHOL", "HDL", "LDLCALC", "TRIG", "CHOLHDL", "LDLDIRECT" in the last 72 hours. Thyroid Function Tests: No results for input(s): "TSH", "T4TOTAL", "FREET4", "T3FREE", "THYROIDAB" in the last 72 hours. Anemia Panel: No results for input(s): "VITAMINB12", "FOLATE", "FERRITIN", "TIBC", "IRON", "RETICCTPCT" in the last 72 hours. Sepsis Labs: No results for input(s): "PROCALCITON", "LATICACIDVEN" in the last 168 hours.  Recent Results (from the past 240 hour(s))  Resp panel by RT-PCR (RSV, Flu A&B, Covid) Anterior Nasal Swab     Status: None   Collection Time: 04/23/22  2:20 AM   Specimen: Anterior Nasal Swab  Result Value Ref Range Status   SARS Coronavirus 2 by RT PCR NEGATIVE NEGATIVE Final    Comment: (NOTE) SARS-CoV-2 target nucleic acids are NOT DETECTED.  The SARS-CoV-2 RNA is generally detectable in upper respiratory specimens during the acute phase of infection. The lowest concentration of SARS-CoV-2 viral copies this assay can detect is 138  copies/mL. A negative result does not preclude SARS-Cov-2 infection and should not be used as the sole basis for treatment or other patient management decisions. A negative result may occur with  improper specimen collection/handling, submission of specimen other than nasopharyngeal swab, presence of viral mutation(s) within the areas targeted by this assay, and inadequate number of viral copies(<138 copies/mL). A negative result must be combined with clinical observations, patient history, and epidemiological information. The expected result is Negative.  Fact Sheet for Patients:  EntrepreneurPulse.com.au  Fact Sheet for Healthcare Providers:  IncredibleEmployment.be  This test is no t yet approved or cleared by the [place] FDA and  has been authorized for detection and/or diagnosis of SARS-CoV-2 by FDA under an Emergency Use Authorization (EUA). This EUA will remain  in effect (meaning this test can be used) for the duration of the COVID-19 declaration under Section 564(b)(1) of the Act, 21 U.S.C.section 360bbb-3(b)(1), unless the authorization is terminated  or revoked sooner.       Influenza A by PCR NEGATIVE NEGATIVE Final   Influenza B by PCR NEGATIVE NEGATIVE Final    Comment: (NOTE) The Xpert  Xpress SARS-CoV-2/FLU/RSV plus assay is intended as an aid in the diagnosis of influenza from Nasopharyngeal swab specimens and should not be used as a sole basis for treatment. Nasal washings and aspirates are unacceptable for Xpert Xpress SARS-CoV-2/FLU/RSV testing.  Fact Sheet for Patients: EntrepreneurPulse.com.au  Fact Sheet for Healthcare Providers: IncredibleEmployment.be  This test is not yet approved or cleared by the [place] FDA and has been authorized for detection and/or diagnosis of SARS-CoV-2 by FDA under an Emergency Use Authorization (EUA). This EUA will remain in effect (meaning  this test can be used) for the duration of the COVID-19 declaration under Section 564(b)(1) of the Act, 21 U.S.C. section 360bbb-3(b)(1), unless the authorization is terminated or revoked.     Resp Syncytial Virus by PCR NEGATIVE NEGATIVE Final    Comment: (NOTE) Fact Sheet for Patients: EntrepreneurPulse.com.au  Fact Sheet for Healthcare Providers: IncredibleEmployment.be  This test is not yet approved or cleared by the [place] FDA and has been authorized for detection and/or diagnosis of SARS-CoV-2 by FDA under an Emergency Use Authorization (EUA). This EUA will remain in effect (meaning this test can be used) for the duration of the COVID-19 declaration under Section 564(b)(1) of the Act, 21 U.S.C. section 360bbb-3(b)(1), unless the authorization is terminated or revoked.  Performed at [facility], [address], [place], [place] [address]          Radiology Studies: DG Elbow Complete Right  Result Date: 04/23/2022 CLINICAL DATA:  fall, R elbow pain EXAM: RIGHT ELBOW - COMPLETE 3+ VIEW COMPARISON:  None Available. FINDINGS: There is no evidence of fracture, dislocation, or joint effusion. Mild degenerative changes of the elbow. Soft tissues are unremarkable. IMPRESSION: No acute displaced fracture or dislocation. Electronically Signed   By: Iven Finn M.D.   On: 04/23/2022 03:52   DG Hip Unilat With Pelvis 2-3 Views Right  Result Date: 04/23/2022 CLINICAL DATA:  SOB, wheezing, history of COPD and CHF; fell out of bed, R hip pain EXAM: DG HIP (WITH OR WITHOUT PELVIS) 2-3V RIGHT COMPARISON:  X-ray pelvis 04/29/2021 FINDINGS: There is no evidence of hip fracture or dislocation of the right hip. Frontal view of the left hip unremarkable. No acute displaced fracture or diastasis of the bones of the pelvis. There is no evidence of arthropathy or other focal bone abnormality. IMPRESSION: Negative. Electronically Signed   By: Iven Finn M.D.   On: 04/23/2022 01:38   DG Chest Port 1 View  Result Date: 04/23/2022 CLINICAL DATA:  SOB, wheezing, history of COPD and CHF; fell out of bed, R hip pain EXAM: PORTABLE CHEST 1 VIEW COMPARISON:  CT renal 02/01/2022, chest x-ray 03/09/2022 FINDINGS: The heart and mediastinal contours are within normal limits. Prominent hilar vasculature. Aortic calcification No focal consolidation. No pulmonary edema. Persistent small left pleural effusion. No pneumothorax. No acute osseous abnormality. IMPRESSION: 1. Central venous congestion with persistent small left pleural effusion. 2.  Aortic Atherosclerosis (ICD10-I70.0). Electronically Signed   By: Iven Finn M.D.   On: 04/23/2022 01:34   CT Head Wo Contrast  Result Date: 04/23/2022 CLINICAL DATA:  Head trauma, minor (Age >= 65y) fell out of bed, recent SDH EXAM: CT HEAD WITHOUT CONTRAST TECHNIQUE: Contiguous axial images were obtained from the base of the skull through the vertex without intravenous contrast. RADIATION DOSE REDUCTION: This exam was performed according to the departmental dose-optimization program which includes automated exposure control, adjustment of the mA and/or kV according to patient size and/or use of iterative reconstruction  technique. COMPARISON:  01/29/2022 FINDINGS: Brain: There is atrophy and chronic small vessel disease changes. No acute intracranial abnormality. Specifically, no hemorrhage, hydrocephalus, mass lesion, acute infarction, or significant intracranial injury. Vascular: No hyperdense vessel or unexpected calcification. Skull: No acute calvarial abnormality. Sinuses/Orbits: No acute findings Other: None IMPRESSION: Atrophy, chronic microvascular disease. No acute intracranial abnormality. Electronically Signed   By: Rolm Baptise M.D.   On: 04/23/2022 01:08        Scheduled Meds:  amantadine  100 mg Oral BID   fluticasone  2 spray Each Nare Daily   fluticasone furoate-vilanterol  1 puff Inhalation  Daily   furosemide  20 mg Oral Daily   gabapentin  100 mg Oral QHS   heparin  5,000 Units Subcutaneous Q8H   loratadine  10 mg Oral Daily   melatonin  3 mg Oral QHS   midodrine  5 mg Oral BID WC   mirtazapine  7.5 mg Oral QHS   multivitamin-lutein  2 capsule Oral Q breakfast   pantoprazole  40 mg Oral Daily   risperiDONE  1 mg Oral QHS   sodium chloride flush  3 mL Intravenous Q12H   traZODone  150 mg Oral QHS   Continuous Infusions:  sodium chloride       LOS: 0 days    Time spent: 35 minutes    Kapena Hamme Darleen Crocker, DO [facility]  If 7PM-7AM, please contact night-coverage [email or web address] 04/24/2022, 2:08 PM

## 2022-04-25 ENCOUNTER — Encounter: Payer: Medicare Other | Admitting: Family

## 2022-04-25 DIAGNOSIS — J9601 Acute respiratory failure with hypoxia: Secondary | ICD-10-CM | POA: Diagnosis not present

## 2022-04-25 MED ORDER — MIDODRINE HCL 5 MG PO TABS: 5.0000 mg | ORAL_TABLET | Freq: Three times a day (TID) | ORAL | 0 refills | Status: DC

## 2022-04-25 NOTE — Discharge Summary (Signed)
Physician Discharge Summary  [name]:[id number] DOB: July 29, 1940 DOA: 04/23/2022  PCP: Lubertha Sayres, FNP  Admit date: 04/23/2022  Discharge date: 04/25/2022  Admitted From:ALF  Disposition:  ALF  Recommendations for Outpatient Follow-up:  Follow up with PCP in 1-2 weeks Continue on home medications as prior with midodrine dose increased to 3 times daily to help support blood pressure Continue home medications as prior Discharge on 2 L nasal cannula as noted below  Home Health: Yes with PT/RN  Equipment/Devices: Home 2 L nasal cannula  Discharge Condition:Stable  CODE STATUS: Full  Diet recommendation: Heart Healthy  Brief/Interim Summary: [name] is a 82 y.o. 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  Upon EMS arrival, she was noted to be hypoxemic in the 70th percentile and therefore brought to the ED for further evaluation and workup.  She did not appear to have heart failure or COPD exacerbation on clinical exam or workup.  She does appear to have some chronic hypoxemia that is likely related to her history of heart failure and COPD.  She qualifies for home oxygen and this has been set up.  Physical therapy recommending home health PT which has also been set up.  No other acute events or concerns noted throughout the course of this hospitalization and she is in stable condition for discharge.  Discharge Diagnoses:  Principal Problem:   Acute respiratory failure with hypoxia (HCC) Active Problems:   Obesity    Anxiety   Gastroesophageal reflux disease without esophagitis   Essential hypertension   Paranoid schizophrenia (HCC)   Severe manic bipolar 1 disorder with psychotic behavior ([place])   Hyponatremia   Fall at home, initial  encounter  Principal discharge diagnosis: Fall with mild hypoxemia suggestive of chronic hypoxemia in the setting of COPD/CHF.  Discharge Instructions  Discharge Instructions     Diet - low sodium heart healthy   Complete by: As directed    Increase activity slowly   Complete by: As directed       Allergies as of 04/25/2022       Reactions   Haloperidol Anaphylaxis   Aripiprazole Other (See Comments)   Bad thoughts    Chlorpheniramine-pse-ibuprofen    Other reaction(s): Cough        Medication List     TAKE these medications    acetaminophen 325 MG tablet Commonly known as: TYLENOL Take 650 mg by mouth every 6 (six) hours as needed for mild pain, moderate pain or fever.   amantadine 100 MG capsule Commonly known as: SYMMETREL Take 100 mg by mouth 2 (two) times daily.   antiseptic oral rinse Liqd 15 mLs by Mouth Rinse route See admin instructions. Use about 15 mL for 30 seconds then spit out up to 5 times daily as needed for dry cough and sore throat.   Breo Ellipta 200-25 MCG/ACT Aepb Generic drug: fluticasone furoate-vilanterol Inhale 1 puff into the lungs daily.   CALCIUM 600+D3 PO Take 1 tablet by mouth daily.   cetirizine 10 MG tablet Commonly known as: ZYRTEC Take 10 mg by mouth daily.   Combivent Respimat 20-100 MCG/ACT Aers respimat Generic drug: Ipratropium-Albuterol Inhale 1 puff into the lungs every 6 (six) hours as needed for shortness of breath or wheezing.   fluticasone 50 MCG/ACT nasal spray  Commonly known as: FLONASE Place 2 sprays into both nostrils daily.   furosemide 20 MG tablet Commonly known as: LASIX Take 1 tablet (20 mg total) by mouth daily.   ibuprofen 400 MG tablet Commonly known as: ADVIL Take 400 mg by mouth every 8 (eight) hours as needed for cramping, moderate pain or fever.   melatonin 3 MG Tabs tablet Take 3 mg by mouth at bedtime.   midodrine 5 MG tablet Commonly known as: PROAMATINE Take 1 tablet (5 mg total)  by mouth 3 (three) times daily with meals. What changed:  when to take this additional instructions   mirtazapine 7.5 MG tablet Commonly known as: REMERON Take 1 tablet (7.5 mg total) by mouth at bedtime.   omeprazole 20 MG capsule Commonly known as: PRILOSEC Take 1 capsule (20 mg total) by mouth daily. What changed: when to take this   PreserVision AREDS 2 Caps Take 1 capsule by mouth 2 (two) times daily with a meal.   risperiDONE 1 MG tablet Commonly known as: RISPERDAL Take 1 tablet (1 mg total) by mouth at bedtime.               Durable Medical Equipment  (From admission, onward)           Start     Ordered   04/24/22 1515  For home use only DME oxygen  Once       Question Answer Comment  Length of Need Lifetime   Mode or (Route) Nasal cannula   Liters per Minute 2   Frequency Continuous (stationary and portable oxygen unit needed)   Oxygen conserving device Yes   Oxygen delivery system Gas      04/24/22 1514            Follow-up Information     Lubertha Sayres, FNP. Schedule an appointment as soon as possible for a visit in 1 week(s).   Specialty: Family Medicine Contact information: [address] [place] [address] [phone]                Allergies  Allergen Reactions   Haloperidol Anaphylaxis   Aripiprazole Other (See Comments)    Bad thoughts    Chlorpheniramine-Pse-Ibuprofen     Other reaction(s): Cough    Consultations: None   Procedures/Studies: DG Elbow Complete Right  Result Date: 04/23/2022 CLINICAL DATA:  fall, R elbow pain EXAM: RIGHT ELBOW - COMPLETE 3+ VIEW COMPARISON:  None Available. FINDINGS: There is no evidence of fracture, dislocation, or joint effusion. Mild degenerative changes of the elbow. Soft tissues are unremarkable. IMPRESSION: No acute displaced fracture or dislocation. Electronically Signed   By: Iven Finn M.D.   On: 04/23/2022 03:52   DG Hip Unilat With Pelvis 2-3 Views Right  Result  Date: 04/23/2022 CLINICAL DATA:  SOB, wheezing, history of COPD and CHF; fell out of bed, R hip pain EXAM: DG HIP (WITH OR WITHOUT PELVIS) 2-3V RIGHT COMPARISON:  X-ray pelvis 04/29/2021 FINDINGS: There is no evidence of hip fracture or dislocation of the right hip. Frontal view of the left hip unremarkable. No acute displaced fracture or diastasis of the bones of the pelvis. There is no evidence of arthropathy or other focal bone abnormality. IMPRESSION: Negative. Electronically Signed   By: Iven Finn M.D.   On: 04/23/2022 01:38   DG Chest Port 1 View  Result Date: 04/23/2022 CLINICAL DATA:  SOB, wheezing, history of COPD and CHF; fell out of bed, R hip pain EXAM: PORTABLE CHEST 1  VIEW COMPARISON:  CT renal 02/01/2022, chest x-ray 03/09/2022 FINDINGS: The heart and mediastinal contours are within normal limits. Prominent hilar vasculature. Aortic calcification No focal consolidation. No pulmonary edema. Persistent small left pleural effusion. No pneumothorax. No acute osseous abnormality. IMPRESSION: 1. Central venous congestion with persistent small left pleural effusion. 2.  Aortic Atherosclerosis (ICD10-I70.0). Electronically Signed   By: Iven Finn M.D.   On: 04/23/2022 01:34   CT Head Wo Contrast  Result Date: 04/23/2022 CLINICAL DATA:  Head trauma, minor (Age >= 65y) fell out of bed, recent SDH EXAM: CT HEAD WITHOUT CONTRAST TECHNIQUE: Contiguous axial images were obtained from the base of the skull through the vertex without intravenous contrast. RADIATION DOSE REDUCTION: This exam was performed according to the departmental dose-optimization program which includes automated exposure control, adjustment of the mA and/or kV according to patient size and/or use of iterative reconstruction technique. COMPARISON:  01/29/2022 FINDINGS: Brain: There is atrophy and chronic small vessel disease changes. No acute intracranial abnormality. Specifically, no hemorrhage, hydrocephalus, mass lesion,  acute infarction, or significant intracranial injury. Vascular: No hyperdense vessel or unexpected calcification. Skull: No acute calvarial abnormality. Sinuses/Orbits: No acute findings Other: None IMPRESSION: Atrophy, chronic microvascular disease. No acute intracranial abnormality. Electronically Signed   By: Rolm Baptise M.D.   On: 04/23/2022 01:08     Discharge Exam: Vitals:   04/24/22 2143 04/25/22 0419  BP: 112/61 (!) 100/57  Pulse: 87 89  Resp: 18 18  Temp: 97.8 F (36.6 C) 98.1 F (36.7 C)  SpO2: 95% 93%   Vitals:   04/24/22 0953 04/24/22 1439 04/24/22 2143 04/25/22 0419  BP: (!) 88/40 112/64 112/61 (!) 100/57  Pulse: 76 82 87 89  Resp:  '16 18 18  '$ Temp:  98.1 F (36.7 C) 97.8 F (36.6 C) 98.1 F (36.7 C)  TempSrc:      SpO2: 93% 98% 95% 93%  Weight:      Height:        General: Pt is alert, awake, not in acute distress Cardiovascular: RRR, S1/S2 +, no rubs, no gallops Respiratory: CTA bilaterally, no wheezing, no rhonchi, 2 L nasal cannula Abdominal: Soft, NT, ND, bowel sounds + Extremities: no edema, no cyanosis    The results of significant diagnostics from this hospitalization (including imaging, microbiology, ancillary and laboratory) are listed below for reference.     Microbiology: Recent Results (from the past 240 hour(s))  Resp panel by RT-PCR (RSV, Flu A&B, Covid) Anterior Nasal Swab     Status: None   Collection Time: 04/23/22  2:20 AM   Specimen: Anterior Nasal Swab  Result Value Ref Range Status   SARS Coronavirus 2 by RT PCR NEGATIVE NEGATIVE Final    Comment: (NOTE) SARS-CoV-2 target nucleic acids are NOT DETECTED.  The SARS-CoV-2 RNA is generally detectable in upper respiratory specimens during the acute phase of infection. The lowest concentration of SARS-CoV-2 viral copies this assay can detect is 138 copies/mL. A negative result does not preclude SARS-Cov-2 infection and should not be used as the sole basis for treatment or other  patient management decisions. A negative result may occur with  improper specimen collection/handling, submission of specimen other than nasopharyngeal swab, presence of viral mutation(s) within the areas targeted by this assay, and inadequate number of viral copies(<138 copies/mL). A negative result must be combined with clinical observations, patient history, and epidemiological information. The expected result is Negative.  Fact Sheet for Patients:  EntrepreneurPulse.com.au  Fact Sheet for Healthcare Providers:  IncredibleEmployment.be  This test is no t yet approved or cleared by the [place] and  has been authorized for detection and/or diagnosis of SARS-CoV-2 by FDA under an Emergency Use Authorization (EUA). This EUA will remain  in effect (meaning this test can be used) for the duration of the COVID-19 declaration under Section 564(b)(1) of the Act, 21 U.S.C.section 360bbb-3(b)(1), unless the authorization is terminated  or revoked sooner.       Influenza A by PCR NEGATIVE NEGATIVE Final   Influenza B by PCR NEGATIVE NEGATIVE Final    Comment: (NOTE) The Xpert Xpress SARS-CoV-2/FLU/RSV plus assay is intended as an aid in the diagnosis of influenza from Nasopharyngeal swab specimens and should not be used as a sole basis for treatment. Nasal washings and aspirates are unacceptable for Xpert Xpress SARS-CoV-2/FLU/RSV testing.  Fact Sheet for Patients: EntrepreneurPulse.com.au  Fact Sheet for Healthcare Providers: IncredibleEmployment.be  This test is not yet approved or cleared by the [place] FDA and has been authorized for detection and/or diagnosis of SARS-CoV-2 by FDA under an Emergency Use Authorization (EUA). This EUA will remain in effect (meaning this test can be used) for the duration of the COVID-19 declaration under Section 564(b)(1) of the Act, 21 U.S.C. section  360bbb-3(b)(1), unless the authorization is terminated or revoked.     Resp Syncytial Virus by PCR NEGATIVE NEGATIVE Final    Comment: (NOTE) Fact Sheet for Patients: EntrepreneurPulse.com.au  Fact Sheet for Healthcare Providers: IncredibleEmployment.be  This test is not yet approved or cleared by the [place] FDA and has been authorized for detection and/or diagnosis of SARS-CoV-2 by FDA under an Emergency Use Authorization (EUA). This EUA will remain in effect (meaning this test can be used) for the duration of the COVID-19 declaration under Section 564(b)(1) of the Act, 21 U.S.C. section 360bbb-3(b)(1), unless the authorization is terminated or revoked.  Performed at [facility], [address], [place], [place] [address]      Labs: BNP (last 3 results) Recent Labs    01/23/22 0259 03/09/22 0555 04/23/22 0022  BNP 229.6* 1,059.0* 628.3*   Basic Metabolic Panel: Recent Labs  Lab 04/23/22 0022 04/24/22 0510  NA 134* 134*  K 4.0 4.0  CL 101 99  CO2 26 28  GLUCOSE 123* 95  BUN 24* 23  CREATININE 1.03* 1.03*  CALCIUM 8.3* 8.3*  MG  --  2.1   Liver Function Tests: No results for input(s): "AST", "ALT", "ALKPHOS", "BILITOT", "PROT", "ALBUMIN" in the last 168 hours. No results for input(s): "LIPASE", "AMYLASE" in the last 168 hours. No results for input(s): "AMMONIA" in the last 168 hours. CBC: Recent Labs  Lab 04/23/22 0022 04/24/22 0510  WBC 6.4 7.3  NEUTROABS 3.8  --   HGB 12.7 11.3*  HCT 40.4 36.3  MCV 93.1 93.8  PLT 214 195   Cardiac Enzymes: No results for input(s): "CKTOTAL", "CKMB", "CKMBINDEX", "TROPONINI" in the last 168 hours. BNP: Invalid input(s): "POCBNP" CBG: No results for input(s): "GLUCAP" in the last 168 hours. D-Dimer No results for input(s): "DDIMER" in the last 72 hours. Hgb A1c No results for input(s): "HGBA1C" in the last 72 hours. Lipid Profile No results for input(s): "CHOL", "HDL",  "LDLCALC", "TRIG", "CHOLHDL", "LDLDIRECT" in the last 72 hours. Thyroid function studies No results for input(s): "TSH", "T4TOTAL", "T3FREE", "THYROIDAB" in the last 72 hours.  Invalid input(s): "FREET3" Anemia work up No results for input(s): "VITAMINB12", "FOLATE", "FERRITIN", "TIBC", "IRON", "RETICCTPCT" in the last 72 hours. Urinalysis    Component  Value Date/Time   COLORURINE COLORLESS (A) 03/09/2022 0956   APPEARANCEUR CLEAR 03/09/2022 0956   APPEARANCEUR Clear 12/13/2021 0957   LABSPEC 1.005 03/09/2022 0956   PHURINE 5.0 03/09/2022 0956   GLUCOSEU NEGATIVE 03/09/2022 0956   HGBUR NEGATIVE 03/09/2022 0956   BILIRUBINUR NEGATIVE 03/09/2022 0956   BILIRUBINUR Negative 12/13/2021 0957   KETONESUR NEGATIVE 03/09/2022 0956   PROTEINUR NEGATIVE 03/09/2022 0956   UROBILINOGEN 0.2 10/29/2016 1541   UROBILINOGEN 0.2 07/21/2007 2218   NITRITE NEGATIVE 03/09/2022 0956   LEUKOCYTESUR TRACE (A) 03/09/2022 0956   Sepsis Labs Recent Labs  Lab 04/23/22 0022 04/24/22 0510  WBC 6.4 7.3   Microbiology Recent Results (from the past 240 hour(s))  Resp panel by RT-PCR (RSV, Flu A&B, Covid) Anterior Nasal Swab     Status: None   Collection Time: 04/23/22  2:20 AM   Specimen: Anterior Nasal Swab  Result Value Ref Range Status   SARS Coronavirus 2 by RT PCR NEGATIVE NEGATIVE Final    Comment: (NOTE) SARS-CoV-2 target nucleic acids are NOT DETECTED.  The SARS-CoV-2 RNA is generally detectable in upper respiratory specimens during the acute phase of infection. The lowest concentration of SARS-CoV-2 viral copies this assay can detect is 138 copies/mL. A negative result does not preclude SARS-Cov-2 infection and should not be used as the sole basis for treatment or other patient management decisions. A negative result may occur with  improper specimen collection/handling, submission of specimen other than nasopharyngeal swab, presence of viral mutation(s) within the areas targeted by this  assay, and inadequate number of viral copies(<138 copies/mL). A negative result must be combined with clinical observations, patient history, and epidemiological information. The expected result is Negative.  Fact Sheet for Patients:  EntrepreneurPulse.com.au  Fact Sheet for Healthcare Providers:  IncredibleEmployment.be  This test is no t yet approved or cleared by the [place] FDA and  has been authorized for detection and/or diagnosis of SARS-CoV-2 by FDA under an Emergency Use Authorization (EUA). This EUA will remain  in effect (meaning this test can be used) for the duration of the COVID-19 declaration under Section 564(b)(1) of the Act, 21 U.S.C.section 360bbb-3(b)(1), unless the authorization is terminated  or revoked sooner.       Influenza A by PCR NEGATIVE NEGATIVE Final   Influenza B by PCR NEGATIVE NEGATIVE Final    Comment: (NOTE) The Xpert Xpress SARS-CoV-2/FLU/RSV plus assay is intended as an aid in the diagnosis of influenza from Nasopharyngeal swab specimens and should not be used as a sole basis for treatment. Nasal washings and aspirates are unacceptable for Xpert Xpress SARS-CoV-2/FLU/RSV testing.  Fact Sheet for Patients: EntrepreneurPulse.com.au  Fact Sheet for Healthcare Providers: IncredibleEmployment.be  This test is not yet approved or cleared by the [place] FDA and has been authorized for detection and/or diagnosis of SARS-CoV-2 by FDA under an Emergency Use Authorization (EUA). This EUA will remain in effect (meaning this test can be used) for the duration of the COVID-19 declaration under Section 564(b)(1) of the Act, 21 U.S.C. section 360bbb-3(b)(1), unless the authorization is terminated or revoked.     Resp Syncytial Virus by PCR NEGATIVE NEGATIVE Final    Comment: (NOTE) Fact Sheet for Patients: EntrepreneurPulse.com.au  Fact Sheet for  Healthcare Providers: IncredibleEmployment.be  This test is not yet approved or cleared by the [place] FDA and has been authorized for detection and/or diagnosis of SARS-CoV-2 by FDA under an Emergency Use Authorization (EUA). This EUA will remain in effect (meaning this test can  be used) for the duration of the COVID-19 declaration under Section 564(b)(1) of the Act, 21 U.S.C. section 360bbb-3(b)(1), unless the authorization is terminated or revoked.  Performed at [facility], [address] [place], [place] [address]      Time coordinating discharge: 35 minutes  SIGNED:   Rodena Goldmann, DO [facility] 04/25/2022, 7:15 AM  If 7PM-7AM, please contact night-coverage [email or web address]

## 2022-04-25 NOTE — TOC Transition Note (Addendum)
Transition of Care [facility]) - CM/SW Discharge Note   Patient Details  Name: [name] MRN: [id number] Date of Birth: [birth year]  [facility]) CM/SW Contact:  Iona Beard, [place] Phone Number: 04/25/2022, 12:12 PM   Clinical Narrative:    CSW spoke to [place] with [employer] who states home O2 would be delivered to [facility] today. CSW confirmed with facility that O2 has been delivered. CSW also confirmed that [facility] can provide transportation at D/C. CSW updated pts legal guardian of plan for D/C today. CSW sent updated Fl2 and D/C summary to facility. Santiago Glad with [facility] updated of plan for D/C today, HH PT/RN orders have been placed. TOC signing off.   Final next level of care: Home w Home Health Services Barriers to Discharge: Barriers Resolved   Patient Goals and CMS Choice CMS Medicare.gov Compare Post Acute Care list provided to:: Legal Guardian Choice offered to / list presented to : [place] / [place]  Discharge Placement                         Discharge Plan and Services Additional resources added to the After Visit Summary for   In-house Referral: Clinical Social Work Discharge Planning Services: CM Consult Post Acute Care Choice: Home Health          DME Arranged: Oxygen DME Agency: [facility] Date DME Agency Contacted: 04/25/22   Representative spoke with at DME Agency: Erasmo Downer HH Arranged: PT, RN [facility] Agency: [place] Date [place]: 04/25/22   Representative spoke with at [place]: [place] Determinants of Health ([place]) Interventions SDOH Screenings   Food Insecurity: No Food Insecurity (04/23/2022)  Housing: Low Risk  (04/23/2022)  Transportation Needs: No Transportation Needs (04/23/2022)  Utilities: Not At Risk (04/23/2022)  Tobacco Use: Low Risk  (04/23/2022)     Readmission Risk Interventions     No data to display

## 2022-04-25 NOTE — NC FL2 (Signed)
[place] LEVEL OF CARE FORM     IDENTIFICATION  Patient Name: [name] Birthdate: 11/25/1940 Sex: female Admission Date (Current Location): 04/23/2022  [facility] and [place] Number:  [employer] and Address:  [facility] [address] [place]      Provider Number: [id number]  Attending Physician Name and Address:  Rodena Goldmann, DO  Relative Name and Phone Number:       Current Level of Care: Hospital Recommended Level of Care: Assisted Living Facility Prior Approval Number:    Date Approved/Denied:   PASRR Number:    Discharge Plan: Other (Comment)    Current Diagnoses: Patient Active Problem List   Diagnosis Date Noted   Acute exacerbation of chronic obstructive pulmonary disease (COPD) ([place]) 04/23/2022   Acute on chronic systolic CHF (congestive heart failure) ([place]) 03/09/2022   Acute respiratory failure with hypoxia ([place]) 03/09/2022   Chronic kidney disease, stage 3a ([place]) 03/09/2022   Severe left ventricular systolic dysfunction (LVSD) 03/07/2022   SDH (subdural hematoma) ([place]) 01/30/2022   Subdural hematoma ([place]) 01/29/2022   Fall at home, initial encounter 01/29/2022   Leukocytosis 01/29/2022   SIADH (syndrome of inappropriate ADH production) ([place]) 26/33/3545   Systolic heart failure ([place]) 01/16/2022   CKD (chronic kidney disease), stage II 01/16/2022   Hyponatremia 01/15/2022   Pressure injury of skin 01/15/2022   hyperthyroidism  62/56/3893   Acute metabolic encephalopathy - RESOLVED 10/24/2021   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   Elevated troponin 10/24/2021   Pain due to onychomycosis of toenails of both feet 09/12/2020   Chronic low back pain without sciatica 10/02/2017   Essential hypertension 10/02/2017   Paranoid schizophrenia ([place]) 09/19/2017   Delusional disorder, 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[place]) 09/18/2017   Behavioral change 09/18/2017   Severe manic bipolar 1 disorder with psychotic behavior ([place])  08/01/2017   History of left inguinal hernia repair 08/06/2016   H/O total knee replacement, left 06/19/2016   Primary osteoarthritis of left knee 04/04/2016   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/2016   Anxiety 11/15/2015   Gastroesophageal reflux disease without esophagitis 11/15/2015   Solitary right kidney 07/07/2015   Urge incontinence of urine 07/07/2015   Asthma 12/07/2011   Arthritis 12/07/2011   Thoracic spondylosis - mild to moderate 2009 12/07/2011   Obesity  12/07/2011   Incisional hernia 03/29/2011   DIVERTICULOSIS-COLON 12/01/2008   ABDOMINAL PAIN-RLQ 12/01/2008    Orientation RESPIRATION BLADDER Height & Weight     Self, Time, Situation, Place  O2 (2L) Continent Weight: 184 lb 1.4 oz (83.5 kg) Height:  5' 2.5" (158.8 cm)  BEHAVIORAL SYMPTOMS/MOOD NEUROLOGICAL BOWEL NUTRITION STATUS      Continent Diet (Heart healthy)  AMBULATORY STATUS COMMUNICATION OF NEEDS Skin   Supervision Verbally Normal                       Personal Care Assistance Level of Assistance  Bathing, Feeding, Dressing Bathing Assistance: Limited assistance Feeding assistance: Independent Dressing Assistance: Limited assistance     Functional Limitations Info  Sight, Hearing, Speech Sight Info: Impaired Hearing Info: Adequate Speech Info: Adequate    SPECIAL CARE FACTORS FREQUENCY                       Contractures Contractures Info: Not present    Additional Factors Info  Code Status, Allergies Code Status Info: FULL Allergies Info: Haloperidol, Aripiprazole, Chlorpheniramine-pse-ibuprofen  Current Medications (04/25/2022):  This is the current hospital active medication list Current Facility-Administered Medications  Medication Dose Route Frequency Provider Last Rate Last Admin   0.9 %  sodium chloride infusion  250 mL Intravenous PRN Manuella Ghazi, Pratik D, DO       acetaminophen (TYLENOL) tablet 650 mg  650 mg Oral Q6H PRN Heath Lark D, DO   650 mg at 04/23/22 8101   Or    acetaminophen (TYLENOL) suppository 650 mg  650 mg Rectal Q6H PRN Manuella Ghazi, Pratik D, DO       amantadine (SYMMETREL) capsule 100 mg  100 mg Oral BID Manuella Ghazi, Pratik D, DO   100 mg at 04/25/22 0837   fluticasone (FLONASE) 50 MCG/ACT nasal spray 2 spray  2 spray Each Nare Daily Manuella Ghazi, Pratik D, DO   2 spray at 04/25/22 0837   fluticasone furoate-vilanterol (BREO ELLIPTA) 200-25 MCG/ACT 1 puff  1 puff Inhalation Daily Manuella Ghazi, Pratik D, DO   1 puff at 04/25/22 7510   furosemide (LASIX) tablet 20 mg  20 mg Oral Daily Manuella Ghazi, Pratik D, DO   20 mg at 04/25/22 2585   gabapentin (NEURONTIN) capsule 100 mg  100 mg Oral QHS Shah, Pratik D, DO   100 mg at 04/24/22 2234   heparin injection 5,000 Units  5,000 Units Subcutaneous Q8H Shah, Pratik D, DO   5,000 Units at 04/25/22 2778   ipratropium-albuterol (DUONEB) 0.5-2.5 (3) MG/3ML nebulizer solution 3 mL  3 mL Nebulization Q6H PRN Manuella Ghazi, Pratik D, DO       loratadine (CLARITIN) tablet 10 mg  10 mg Oral Daily Manuella Ghazi, Pratik D, DO   10 mg at 04/25/22 2423   melatonin tablet 3 mg  3 mg Oral QHS Manuella Ghazi, Pratik D, DO   3 mg at 04/24/22 2234   midodrine (PROAMATINE) tablet 5 mg  5 mg Oral BID WC Manuella Ghazi, Pratik D, DO   5 mg at 04/25/22 5361   mirtazapine (REMERON) tablet 7.5 mg  7.5 mg Oral QHS Manuella Ghazi, Pratik D, DO   7.5 mg at 04/24/22 2233   multivitamin-lutein (OCUVITE-LUTEIN) capsule 2 capsule  2 capsule Oral Q breakfast Manuella Ghazi, Pratik D, DO   2 capsule at 04/25/22 0833   ondansetron (ZOFRAN) tablet 4 mg  4 mg Oral Q6H PRN Manuella Ghazi, Pratik D, DO       Or   ondansetron (ZOFRAN) injection 4 mg  4 mg Intravenous Q6H PRN Manuella Ghazi, Pratik D, DO       pantoprazole (PROTONIX) EC tablet 40 mg  40 mg Oral Daily Manuella Ghazi, Pratik D, DO   40 mg at 04/25/22 4431   risperiDONE (RISPERDAL) tablet 1 mg  1 mg Oral QHS Manuella Ghazi, Pratik D, DO   1 mg at 04/24/22 2232   sodium chloride flush (NS) 0.9 % injection 3 mL  3 mL Intravenous Q12H Shah, Pratik D, DO   3 mL at 04/25/22 5400   sodium chloride flush (NS) 0.9 %  injection 3 mL  3 mL Intravenous PRN Manuella Ghazi, Pratik D, DO       traZODone (DESYREL) tablet 150 mg  150 mg Oral QHS Shah, Pratik D, DO   150 mg at 04/24/22 2234     Discharge Medications: acetaminophen 325 MG tablet Commonly known as: TYLENOL Take 650 mg by mouth every 6 (six) hours as needed for mild pain, moderate pain or fever.    amantadine 100 MG capsule Commonly known as: SYMMETREL Take 100 mg by mouth 2 (two) times daily.  antiseptic oral rinse Liqd 15 mLs by Mouth Rinse route See admin instructions. Use about 15 mL for 30 seconds then spit out up to 5 times daily as needed for dry cough and sore throat.    Breo Ellipta 200-25 MCG/ACT Aepb Generic drug: fluticasone furoate-vilanterol Inhale 1 puff into the lungs daily.    CALCIUM 600+D3 PO Take 1 tablet by mouth daily.    cetirizine 10 MG tablet Commonly known as: ZYRTEC Take 10 mg by mouth daily.    Combivent Respimat 20-100 MCG/ACT Aers respimat Generic drug: Ipratropium-Albuterol Inhale 1 puff into the lungs every 6 (six) hours as needed for shortness of breath or wheezing.    fluticasone 50 MCG/ACT nasal spray Commonly known as: FLONASE Place 2 sprays into both nostrils daily.    furosemide 20 MG tablet Commonly known as: LASIX Take 1 tablet (20 mg total) by mouth daily.    ibuprofen 400 MG tablet Commonly known as: ADVIL Take 400 mg by mouth every 8 (eight) hours as needed for cramping, moderate pain or fever.    melatonin 3 MG Tabs tablet Take 3 mg by mouth at bedtime.    midodrine 5 MG tablet Commonly known as: PROAMATINE Take 1 tablet (5 mg total) by mouth 3 (three) times daily with meals. What changed:  when to take this additional instructions    mirtazapine 7.5 MG tablet Commonly known as: REMERON Take 1 tablet (7.5 mg total) by mouth at bedtime.    omeprazole 20 MG capsule Commonly known as: PRILOSEC Take 1 capsule (20 mg total) by mouth daily. What changed: when to take this     PreserVision AREDS 2 Caps Take 1 capsule by mouth 2 (two) times daily with a meal.    risperiDONE 1 MG tablet Commonly known as: RISPERDAL Take 1 tablet (1 mg total) by mouth at bedtime.      Relevant Imaging Results:  Relevant Lab Results:   Additional Information SSN: [id number] [place]

## 2022-05-10 ENCOUNTER — Encounter: Payer: Self-pay | Admitting: Family

## 2022-05-10 ENCOUNTER — Ambulatory Visit: Payer: 59 | Attending: Family | Admitting: Family

## 2022-05-10 VITALS — BP 120/76 | HR 79 | Resp 16 | Wt 177.2 lb

## 2022-05-10 DIAGNOSIS — F419 Anxiety disorder, unspecified: Secondary | ICD-10-CM | POA: Diagnosis not present

## 2022-05-10 DIAGNOSIS — R42 Dizziness and giddiness: Secondary | ICD-10-CM | POA: Diagnosis not present

## 2022-05-10 DIAGNOSIS — I447 Left bundle-branch block, unspecified: Secondary | ICD-10-CM | POA: Diagnosis not present

## 2022-05-10 DIAGNOSIS — F209 Schizophrenia, unspecified: Secondary | ICD-10-CM | POA: Diagnosis not present

## 2022-05-10 DIAGNOSIS — I5022 Chronic systolic (congestive) heart failure: Secondary | ICD-10-CM | POA: Diagnosis not present

## 2022-05-10 DIAGNOSIS — J441 Chronic obstructive pulmonary disease with (acute) exacerbation: Secondary | ICD-10-CM | POA: Insufficient documentation

## 2022-05-10 DIAGNOSIS — I130 Hypertensive heart and chronic kidney disease with heart failure and stage 1 through stage 4 chronic kidney disease, or unspecified chronic kidney disease: Secondary | ICD-10-CM | POA: Insufficient documentation

## 2022-05-10 DIAGNOSIS — J449 Chronic obstructive pulmonary disease, unspecified: Secondary | ICD-10-CM | POA: Diagnosis not present

## 2022-05-10 DIAGNOSIS — I10 Essential (primary) hypertension: Secondary | ICD-10-CM | POA: Diagnosis not present

## 2022-05-10 DIAGNOSIS — R002 Palpitations: Secondary | ICD-10-CM | POA: Diagnosis not present

## 2022-05-10 DIAGNOSIS — N183 Chronic kidney disease, stage 3 unspecified: Secondary | ICD-10-CM | POA: Insufficient documentation

## 2022-05-10 DIAGNOSIS — R5383 Other fatigue: Secondary | ICD-10-CM | POA: Diagnosis present

## 2022-05-10 DIAGNOSIS — J90 Pleural effusion, not elsewhere classified: Secondary | ICD-10-CM | POA: Insufficient documentation

## 2022-05-10 DIAGNOSIS — D631 Anemia in chronic kidney disease: Secondary | ICD-10-CM | POA: Diagnosis not present

## 2022-05-10 DIAGNOSIS — F200 Paranoid schizophrenia: Secondary | ICD-10-CM | POA: Diagnosis not present

## 2022-05-10 DIAGNOSIS — Z79899 Other long term (current) drug therapy: Secondary | ICD-10-CM | POA: Insufficient documentation

## 2022-05-10 DIAGNOSIS — K219 Gastro-esophageal reflux disease without esophagitis: Secondary | ICD-10-CM | POA: Diagnosis not present

## 2022-05-10 MED ORDER — LOSARTAN POTASSIUM 25 MG PO TABS: 12.5000 mg | ORAL_TABLET | Freq: Every day | ORAL | 3 refills | Status: DC

## 2022-05-10 NOTE — Patient Instructions (Addendum)
Continue weighing daily and call for an overnight weight gain of 3 pounds or more or a weekly weight gain of more than 5 pounds.   Begin losartan as 1/2 tablet daily

## 2022-05-10 NOTE — Progress Notes (Signed)
Patient ID: [name], female    DOB: 1940-12-24, 82 y.o.   MRN: [id number]  HPI  [name] is a 82 y/o female with a history of asthma, HTN, CKD, anemia, anxiety, COPD, diverticulosis, GERD, schizophrenia and chronic heart failure.   Echo 01/30/22 showed EF of 30-35% along with moderate LAE, mild/ moderate MR and moderate left pleural effusion.   Cardiac MRI 03/06/22: Several cystic liver lesions noted, largest 3.7 cm in diameter.  Moderate bilateral pleural effusions.  Trivial pericardial effusion. Moderately dilated left ventricle with normal wall thickness. Septal-lateral dyssynchrony consistent with LBBB, global hypokinesis with EF 23%. Normal right ventricular size with mildly reduced systolic function, EF 22%. Mild left atrial enlargement, normal right atrial size. Visually, there is at least mild mitral regurgitation. By flow analysis, there is no significant mitral regurgitation. Trileaflet aortic valve, no signficant stenosis, trivial aortic insufficiency.  Admitted 04/23/22 due to COPD exacerbation and fall. Home O2 was set up. Admitted 03/09/22 due to SOB due to HF exacerbation.    She presents today for her initial visit with a chief complaint of minimal fatigue with moderate exertion. Describes this as chronic in nature. She has associated cough, SOB, palpitations, light-headedness and easy bruising along with this. Denies any difficulty sleeping, abdominal distention, pedal edema, chest pain or weight gain.   Says that she's getting weighed daily along with daily BP checks at the assisted living facility where she lives.   Past Medical History:  Diagnosis Date   Anemia 1990   hx of   Anxiety    Arthritis 2004   Asthma 1995   uses inhaler   CHF (congestive heart failure) (H[facility])    Chronic HFrEF (heart failure with reduced ejection fraction) ([place])    a. 07/2021 Echo: EF 25%; b. 01/2022 Echo: EF 30-35%, glob HK, GrI DD, nl RV fxn, mild-mod MR; c. 02/2022 cMRI: EF  23%.   CKD (chronic kidney disease), stage III ([place]) 2010   a. Solitary kidney - s/p L nephrectomy in 2010.   Colon polyp    Complication of anesthesia 1966   first surgery she had- had a hard time waking up from surgery   COPD (chronic obstructive pulmonary disease) ([place])    Delusional disorder ([place])    Six delusional disorder   Diverticulosis 2003   GERD (gastroesophageal reflux disease)    on meds   Hypertension    NICM (nonischemic cardiomyopathy) ([place])    a. 07/2021 Echo: EF 25%; b. 11/2021 [facility] PET/CT: EF 28% (35% w/ stress). Cor Ca2+. No ischemia; c.01/2022 Echo: EF 30-35%, glob HK, GrI DD, nl RV fxn, mild-mod MR; d. 02/2022 cMRI: EF 23%, diff HK, septal-lateral dyssynchrony w/ LBBB. Nonspecific RV insertion site LGE, borderline elev extracellular volume percentage sugg some replacement fibrosis-->NICM.   Schizophrenia ([place])    Severe left ventricular systolic dysfunction (LVSD)    EF 23% by cardiac MRI   Past Surgical History:  Procedure Laterality Date   APPENDECTOMY  1966   CATARACT EXTRACTION     Bilateral   CHOLECYSTECTOMY  2004   COLON SURGERY  2008   COLONOSCOPY  2013   JP-MAC-moviprep(good)-TA   COLOSTOMY     COLOSTOMY TAKEDOWN     DIAGNOSTIC LAPAROSCOPY     exploratory for endometriosis   HEEL SPUR SURGERY     Right, Metal Plate in Heel   [place]  2010   NEPHRECTOMY Left 4825   As complication of partial colectomy for diverticulitis   POLYPECTOMY  2013   TA   TONSILLECTOMY     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   Procedure: LEFT TOTAL KNEE ARTHROPLASTY;  Surgeon: Susa Day, MD;  Location: [facility];  Service: Orthopedics;  Laterality: Left;  Adductor Block   Family History  Problem Relation Age of Onset   Hypertension Mother    Heart failure Mother    Hyperlipidemia Mother    Stroke Mother    Hypertension Father    Heart attack Father    COPD Father    Diabetes Paternal Aunt    Diabetes Paternal Grandmother    Colon polyps  Other        niece   Stomach cancer Neg Hx    Rectal cancer Neg Hx    Esophageal cancer Neg Hx    Colon cancer Neg Hx    Social History   Tobacco Use   Smoking status: Never   Smokeless tobacco: Never  Substance Use Topics   Alcohol use: Not Currently    Comment: occassionally   Allergies  Allergen Reactions   Haloperidol Anaphylaxis   Aripiprazole Other (See Comments)    Bad thoughts    Chlorpheniramine-Pse-Ibuprofen     Other reaction(s): Cough   Prior to Admission medications   Medication Sig Start Date End Date Taking? Authorizing Provider  acetaminophen (TYLENOL) 325 MG tablet Take 650 mg by mouth every 6 (six) hours as needed for mild pain, moderate pain or fever.   Yes [provider]  amantadine (SYMMETREL) 100 MG capsule Take 100 mg by mouth 2 (two) times daily. 05/03/21  Yes [provider]  antiseptic oral rinse (BIOTENE) LIQD 15 mLs by Mouth Rinse route See admin instructions. Use about 15 mL for 30 seconds then spit out up to 5 times daily as needed for dry cough and sore throat.   Yes [provider]  BREO ELLIPTA 200-25 MCG/INH AEPB Inhale 1 puff into the lungs daily. 06/22/20  Yes [provider]  Calcium Carb-Cholecalciferol (CALCIUM 600+D3 PO) Take 1 tablet by mouth daily.   Yes [provider]  cetirizine (ZYRTEC) 10 MG tablet Take 10 mg by mouth daily.   Yes [provider]  fluticasone (FLONASE) 50 MCG/ACT nasal spray Place 2 sprays into both nostrils daily. 05/02/21  Yes [provider]  furosemide (LASIX) 20 MG tablet Take 1 tablet (20 mg total) by mouth daily. 03/13/22  Yes Tat, Shanon Brow, MD  ibuprofen (ADVIL) 400 MG tablet Take 400 mg by mouth every 8 (eight) hours as needed for cramping, moderate pain or fever.   Yes [provider]  losartan (COZAAR) 25 MG tablet Take 0.5 tablets (12.5 mg total) by mouth daily. 05/10/22  Yes Mikyle Sox A, FNP  melatonin 3 MG TABS tablet Take 3 mg by mouth  at bedtime.   Yes [provider]  mirtazapine (REMERON) 7.5 MG tablet Take 1 tablet (7.5 mg total) by mouth at bedtime. 01/23/22  Yes Thurnell Lose, MD  Multiple Vitamins-Minerals (PRESERVISION AREDS 2) CAPS Take 1 capsule by mouth 2 (two) times daily with a meal.   Yes [provider]  omeprazole (PRILOSEC) 20 MG capsule Take 1 capsule (20 mg total) by mouth daily. Patient taking differently: Take 20 mg by mouth 2 (two) times daily. 06/19/19  Yes Palumbo, April, MD  risperiDONE (RISPERDAL) 1 MG tablet Take 1 tablet (1 mg total) by mouth at bedtime. 10/27/21  Yes Johnson, Clanford L, MD  Ipratropium-Albuterol (COMBIVENT RESPIMAT) 20-100 MCG/ACT AERS respimat Inhale 1  puff into the lungs every 6 (six) hours as needed for shortness of breath or wheezing. Patient not taking: Reported on 05/10/2022    [provider]  midodrine (PROAMATINE) 5 MG tablet Take 1 tablet (5 mg total) by mouth 3 (three) times daily with meals. Patient not taking: Reported on 05/10/2022 04/25/22 05/25/22  Heath Lark D, DO    Review of Systems  Constitutional:  Positive for fatigue. Negative for appetite change.  HENT:  Negative for congestion, postnasal drip and sore throat.   Eyes: Negative.   Respiratory:  Positive for cough (improving) and shortness of breath. Negative for chest tightness.   Cardiovascular:  Positive for palpitations (at times). Negative for chest pain and leg swelling.  Gastrointestinal:  Negative for abdominal distention and abdominal pain.  Endocrine: Negative.   Genitourinary: Negative.   Musculoskeletal:  Negative for back pain and neck pain.  Skin: Negative.   Allergic/Immunologic: Negative.   Neurological:  Positive for light-headedness. Negative for dizziness.  Hematological:  Negative for adenopathy. Bruises/bleeds easily.  Psychiatric/Behavioral:  Negative for dysphoric mood and sleep disturbance (sleeping on 2 pillow; oxygen while asleep). The patient is not  nervous/anxious.     Vitals:   05/10/22 1145  BP: 120/76  Pulse: 79  Resp: 16  SpO2: 95%  Weight: 177 lb 4 oz (80.4 kg)   Wt Readings from Last 3 Encounters:  05/10/22 177 lb 4 oz (80.4 kg)  04/23/22 184 lb 1.4 oz (83.5 kg)  03/28/22 179 lb (81.2 kg)   Lab Results  Component Value Date   CREATININE 1.03 (H) 04/24/2022   CREATININE 1.03 (H) 04/23/2022   CREATININE 1.34 (H) 03/28/2022   Physical Exam Vitals and nursing note reviewed. Exam conducted with a chaperone present (friend/ caregiver).  Constitutional:      Appearance: Normal appearance.  HENT:     Head: Normocephalic and atraumatic.  Cardiovascular:     Rate and Rhythm: Normal rate and regular rhythm.  Pulmonary:     Effort: Pulmonary effort is normal.     Breath sounds: No wheezing, rhonchi or rales.  Abdominal:     General: There is no distension.     Palpations: Abdomen is soft.     Tenderness: There is no abdominal tenderness.  Musculoskeletal:        General: No tenderness.     Cervical back: Normal range of motion and neck supple.     Right lower leg: No edema.     Left lower leg: No edema.  Skin:    General: Skin is warm and dry.  Neurological:     General: No focal deficit present.     Mental Status: She is alert and oriented to person, place, and time.  Psychiatric:        Mood and Affect: Mood normal.        Behavior: Behavior normal.   Assessment & Plan:  1: Chronic heart failure with reduced ejection fraction- - NYHA class II - euvolemic  - being weighed daily and chart shows stable weight; order written to call for an overnight weight gain of > 2 pounds or a weekly weight gain of > 5 pounds - adds the "small" packet of salt to some foods and likes to eat oysters with cocktail sauce; reviewed not eating the sauce and to use pepper on her food - drinking tea and water during the day - hypotension has limited GDMT although facility BP, off of midodrine) range from 130-144/60-83 - will try  low dose losartan 12.86m daily with continued daily BP checks - BMP next visit - saw cardiology (Sharolyn Douglas 03/28/22; note mentions referral to [facility] provider so will get this scheduled for next time - BNP 04/23/22 was 883.0  2: HTN- - BP 120/76  - [facility] showed sodium 134, potassium 4.0, creatinine 1.03 & GFR 55  3: COPD- - combivent PRN  4: Schizophrenia- - risperidone 171mQHS - amantadine 10045mID - mirtazapine 7.5mg41mS   Facility medication list reviewed.   Return in 2 weeks, sooner if needed.

## 2022-05-22 ENCOUNTER — Encounter: Payer: 59 | Admitting: Cardiology

## 2022-05-24 ENCOUNTER — Other Ambulatory Visit
Admission: RE | Admit: 2022-05-24 | Discharge: 2022-05-24 | Disposition: A | Discharge status: Home / Self Care | Payer: 59 | Source: Ambulatory Visit | Attending: Cardiology | Admitting: Cardiology

## 2022-05-24 ENCOUNTER — Other Ambulatory Visit (HOSPITAL_COMMUNITY): Payer: Self-pay

## 2022-05-24 ENCOUNTER — Ambulatory Visit (HOSPITAL_BASED_OUTPATIENT_CLINIC_OR_DEPARTMENT_OTHER): Payer: 59 | Admitting: Cardiology

## 2022-05-24 VITALS — BP 133/78 | HR 78 | Wt 177.5 lb

## 2022-05-24 DIAGNOSIS — I5022 Chronic systolic (congestive) heart failure: Secondary | ICD-10-CM

## 2022-05-24 LAB — BASIC METABOLIC PANEL
Anion gap: 10 (ref 5–15)
BUN: 23 mg/dL (ref 8–23)
CO2: 24 mmol/L (ref 22–32)
Calcium: 8.7 mg/dL — ABNORMAL LOW (ref 8.9–10.3)
Chloride: 97 mmol/L — ABNORMAL LOW (ref 98–111)
Creatinine, Ser: 1.14 mg/dL — ABNORMAL HIGH (ref 0.44–1.00)
GFR, Estimated: 49 mL/min — ABNORMAL LOW (ref >60)
Glucose, Bld: 94 mg/dL (ref 70–99)
Potassium: 4.1 mmol/L (ref 3.5–5.1)
Sodium: 131 mmol/L — ABNORMAL LOW (ref 135–145)

## 2022-05-24 LAB — BRAIN NATRIURETIC PEPTIDE: B Natriuretic Peptide: 720.9 pg/mL — ABNORMAL HIGH (ref 0.0–100.0)

## 2022-05-24 MED ORDER — DAPAGLIFLOZIN PROPANEDIOL 10 MG PO TABS: 10.0000 mg | ORAL_TABLET | Freq: Every day | ORAL | 3 refills | Status: DC

## 2022-05-24 MED ORDER — SPIRONOLACTONE 25 MG PO TABS: 12.5000 mg | ORAL_TABLET | Freq: Every day | ORAL | 3 refills | Status: DC

## 2022-05-24 NOTE — Progress Notes (Signed)
PCP: Lubertha Sayres, [place] [facility]: Dr. Radford Pax HF Cardiology: Dr. Aundra Dubin  82 y.o. with history of CKD stage 3, schizophrenia, and chronic systolic CHF was referred by Dr. Radford Pax for evaluation of CHF.  She lives in an assisted living facility.  She does not smoker or drink ETOH.  Patient has been known to have a cardiomyopathy since 4/23.  Echo at that time showed EF 25%.  She had a cardiac PET done in 8/23 that showed no ischemia.  Echo in 10/23 showed that EF remained low at 30-35%, and cardiac MRI showed  LVEF 23%, moderate LV dilation, septal-lateral dyssynchrony, RVEF 46%, nonspecific RV insertion site LGE (consistent with NICM).   Currently, she seems to be doing well.  She walks with a walker when she goes out of her facility but walks the halls at her facility without a walker.  She denies lightheadedness though titration of GDMT has been limited by orthostatic symptoms in the past.  SBP in 130s today, has been running in that range recently.  No dyspnea walking on flat ground, she does get short of breath walking up stairs and taking a shower.  Fatigues easily.  No chest pain, no orthopnea/PND. Of note, patient had COVID-19 x 3.  Her mother apparently had CHF in her 50s.   ECG (personally reviewed): NSR, LBBB-like IVCD 144 msec  Labs (1/24): K 4, creatinine 1.03, BNP 883  PMH: 1. Asthma 2. CKD stage 3 3. Schizophrenia 4. H/o left nephrectomy 5. Subdural hematoma: 10/23, due to fall.  6. Chronic systolic CHF: Nonischemic cardiomyopathy.  - Echo (4/23): EF 25% - Echo (10/23): EF 30-35%, normal RV, mild-moderate MR - Cardiac PET (8/23): EF 28%, no ischemia, coronary calcifications noted.  - Cardiac MRI (11/23): LVEF 23%, moderate LV dilation, septal-lateral dyssynchrony, RVEF 46%, nonspecific RV insertion site LGE (consistent with NICM).  7. LBBB 8. COVID-19 x 3  Social History   Socioeconomic History   Marital status: Legally Separated    Spouse name: Not on file   Number of  children: 10   Years of education: Not on file   Highest education level: Not on file  Occupational History   Occupation: retired  Tobacco Use   Smoking status: Never   Smokeless tobacco: Never  Vaping Use   Vaping Use: Never used  Substance and Sexual Activity   Alcohol use: Not Currently    Comment: occassionally   Drug use: No   Sexual activity: Not Currently  Other Topics Concern   Not on file  Social History Narrative   Not on file   Social Determinants of Health   Financial Resource Strain: Not on file  Food Insecurity: No Food Insecurity (04/23/2022)   Hunger Vital Sign    Worried About Running Out of Food in the Last Year: Never true    Ran Out of Food in the Last Year: Never true  Transportation Needs: No Transportation Needs (04/23/2022)   PRAPARE - Hydrologist (Medical): No    Lack of Transportation (Non-Medical): No  Physical Activity: Not on file  Stress: Not on file  Social Connections: Not on file  Intimate Partner Violence: Not At Risk (04/23/2022)   Humiliation, Afraid, Rape, and Kick questionnaire    Fear of Current or Ex-Partner: No    Emotionally Abused: No    Physically Abused: No    Sexually Abused: No   Family History  Problem Relation Age of Onset   Hypertension Mother  Heart failure Mother    Hyperlipidemia Mother    Stroke Mother    Hypertension Father    Heart attack Father    COPD Father    Diabetes Paternal Aunt    Diabetes Paternal Grandmother    Colon polyps Other        niece   Stomach cancer Neg Hx    Rectal cancer Neg Hx    Esophageal cancer Neg Hx    Colon cancer Neg Hx    ROS: All systems reviewed and negative except as per HPI.   Current Outpatient Medications  Medication Sig Dispense Refill   acetaminophen (TYLENOL) 325 MG tablet Take 650 mg by mouth every 6 (six) hours as needed for mild pain, moderate pain or fever.     amantadine (SYMMETREL) 100 MG capsule Take 100 mg by mouth 2 (two)  times daily.     antiseptic oral rinse (BIOTENE) LIQD 15 mLs by Mouth Rinse route See admin instructions. Use about 15 mL for 30 seconds then spit out up to 5 times daily as needed for dry cough and sore throat.     BREO ELLIPTA 200-25 MCG/INH AEPB Inhale 1 puff into the lungs daily.     Calcium Carb-Cholecalciferol (CALCIUM 600+D3 PO) Take 1 tablet by mouth daily.     cetirizine (ZYRTEC) 10 MG tablet Take 10 mg by mouth daily.     dapagliflozin propanediol (FARXIGA) 10 MG TABS tablet Take 1 tablet (10 mg total) by mouth daily before breakfast. 90 tablet 3   fluticasone (FLONASE) 50 MCG/ACT nasal spray Place 2 sprays into both nostrils daily.     furosemide (LASIX) 20 MG tablet Take 1 tablet (20 mg total) by mouth daily. 30 tablet 1   ibuprofen (ADVIL) 400 MG tablet Take 400 mg by mouth every 8 (eight) hours as needed for cramping, moderate pain or fever.     losartan (COZAAR) 25 MG tablet Take 0.5 tablets (12.5 mg total) by mouth daily. 30 tablet 3   melatonin 3 MG TABS tablet Take 3 mg by mouth at bedtime.     midodrine (PROAMATINE) 5 MG tablet Take 5 mg by mouth 3 (three) times daily with meals.     mirtazapine (REMERON) 7.5 MG tablet Take 1 tablet (7.5 mg total) by mouth at bedtime. 30 tablet 0   omeprazole (PRILOSEC) 20 MG capsule Take 1 capsule (20 mg total) by mouth daily. (Patient taking differently: Take 20 mg by mouth 2 (two) times daily.) 30 capsule 0   risperiDONE (RISPERDAL) 1 MG tablet Take 1 tablet (1 mg total) by mouth at bedtime. 30 tablet 1   spironolactone (ALDACTONE) 25 MG tablet Take 0.5 tablets (12.5 mg total) by mouth daily. 45 tablet 3   No current facility-administered medications for this visit.   BP 133/78   Pulse 78   Wt 177 lb 8 oz (80.5 kg)   SpO2 96%   BMI 31.95 kg/m  General: NAD Neck: No JVD, no thyromegaly or thyroid nodule.  Lungs: Clear to auscultation bilaterally with normal respiratory effort. CV: Nondisplaced PMI.  Heart regular S1/S2, no S3/S4, no  murmur.  No peripheral edema.  No carotid bruit.  Normal pedal pulses.  Abdomen: Soft, nontender, no hepatosplenomegaly, no distention.  Skin: Intact without lesions or rashes.  Neurologic: Alert and oriented x 3.  Psych: Normal affect. Extremities: No clubbing or cyanosis.  HEENT: Normal.   Assessment/Plan: 1. Chronic systolic CHF:  Diagnosed in 4/23 with echo showed EF 25%.  Suspect  nonischemic cardiomyopathy, cardiac PET showed no ischemia and cardiac MRI did not show evidence for MI.  No ETOH history.  Mother with HF in her 36s.  ?post-viral myocarditis, had 3 bouts with COVID-19.  Last echo in 10/23 showeed EF 30-35% withj normal RV.  Cardiac MRI in 11/23 showed LVEF 23%, moderate LV dilation, septal-lateral dyssynchrony, RVEF 46%, nonspecific RV insertion site LGE (consistent with NICM). NYHA class II symptoms.  She is not volume overloaded on exam. GDMT titration has apparently been limited by orthostasis, but she denies current orthostatic symptoms and SBP in 130s last few times it was measured.  - Continue losartan 12.5 mg daily, if she remains stable would try to transition to [facility].  - Continue Lasix 20 mg daily.  - Add spironolactone 12.5 daily.  - Add Farxiga 10 mg daily.  - BMET/BNP today and BMET in 10 days.  - Repeat echo in a few months after as much med titration as we can do.  If EF remains low, would refer for EP evaluation for CRT.  She has a wide LBBB-like IVCD and I think she would benefit. At her age and with NICM, ICD probably would not have much utility.  2. Schizophrenia: Appropriate with conversation today.   Followup with APP 3 wks, see me in 6 wks for med titration.   Loralie Champagne 05/24/2022

## 2022-05-24 NOTE — Patient Instructions (Signed)
START Spironolactone 12.5 mg daily  START Farxiga 27m daily  Routine lab work today. Will notify you of abnormal results  Repeat labs in 10days  Follow up in 3 weeks with TLinden Logan Follow up in April with [name] (we will call to schedule once that schedule is built)  Do the following things EVERYDAY: Weigh yourself in the morning before breakfast. Write it down and keep it in a log. Take your medicines as prescribed Eat low salt foods--Limit salt (sodium) to 2000 mg per day.  Stay as active as you can everyday Limit all fluids for the day to less than 2 liters

## 2022-06-13 ENCOUNTER — Encounter: Payer: Self-pay | Admitting: Urology

## 2022-06-13 ENCOUNTER — Ambulatory Visit (INDEPENDENT_AMBULATORY_CARE_PROVIDER_SITE_OTHER): Payer: 59 | Admitting: Urology

## 2022-06-13 VITALS — BP 98/62 | HR 97

## 2022-06-13 DIAGNOSIS — R310 Gross hematuria: Secondary | ICD-10-CM

## 2022-06-13 DIAGNOSIS — R339 Retention of urine, unspecified: Secondary | ICD-10-CM

## 2022-06-13 LAB — URINALYSIS, ROUTINE W REFLEX MICROSCOPIC
Bilirubin, UA: NEGATIVE
Ketones, UA: NEGATIVE
Nitrite, UA: NEGATIVE
Protein,UA: NEGATIVE
RBC, UA: NEGATIVE
Specific Gravity, UA: 1.02 (ref 1.005–1.030)
Urobilinogen, Ur: 1 mg/dL (ref 0.2–1.0)
pH, UA: 5.5 (ref 5.0–7.5)

## 2022-06-13 LAB — MICROSCOPIC EXAMINATION

## 2022-06-13 LAB — BLADDER SCAN AMB NON-IMAGING: Scan Result: 17

## 2022-06-13 NOTE — Progress Notes (Unsigned)
post void residual=17

## 2022-06-13 NOTE — Patient Instructions (Signed)

## 2022-06-13 NOTE — Progress Notes (Unsigned)
06/13/2022 11:30 AM   [name] [birth year] [id number]  Referring provider: Lubertha Sayres, [place] [address] [place],  [place] [address]  No chief complaint on file.   HPI: PVR 17cc. Nocturia 2x which does not bother her. No gross hematuria since last visit. No worsening LUTS. No straining to urinate. 1 UTi since last visit.    PMH: Past Medical History:  Diagnosis Date   Anemia 1990   hx of   Anxiety    Arthritis 2004   Asthma 1995   uses inhaler   CHF (congestive heart failure) ([place])    Chronic HFrEF (heart failure with reduced ejection fraction) ([place])    a. 07/2021 Echo: EF 25%; b. 01/2022 Echo: EF 30-35%, glob HK, GrI DD, nl RV fxn, mild-mod MR; c. 02/2022 cMRI: EF 23%.   CKD (chronic kidney disease), stage III ([place]) 2010   a. Solitary kidney - s/p L nephrectomy in 2010.   Colon polyp    Complication of anesthesia 1966   first surgery she had- had a hard time waking up from surgery   COPD (chronic obstructive pulmonary disease) ([place])    Delusional disorder ([place])    Six delusional disorder   Diverticulosis 2003   GERD (gastroesophageal reflux disease)    on meds   Hypertension    NICM (nonischemic cardiomyopathy) ([place])    a. 07/2021 Echo: EF 25%; b. 11/2021 [facility] PET/CT: EF 28% (35% w/ stress). Cor Ca2+. No ischemia; c.01/2022 Echo: EF 30-35%, glob HK, GrI DD, nl RV fxn, mild-mod MR; d. 02/2022 cMRI: EF 23%, diff HK, septal-lateral dyssynchrony w/ LBBB. Nonspecific RV insertion site LGE, borderline elev extracellular volume percentage sugg some replacement fibrosis-->NICM.   Schizophrenia ([place])    Severe left ventricular systolic dysfunction (LVSD)    EF 23% by cardiac MRI    Surgical History: Past Surgical History:  Procedure Laterality Date   APPENDECTOMY  1966   CATARACT EXTRACTION     Bilateral   CHOLECYSTECTOMY  2004   COLON SURGERY  2008   COLONOSCOPY  2013   JP-MAC-moviprep(good)-TA   COLOSTOMY     COLOSTOMY TAKEDOWN     DIAGNOSTIC LAPAROSCOPY      exploratory for endometriosis   HEEL SPUR SURGERY     Right, Metal Plate in [place]  2010   NEPHRECTOMY Left [id number]   As complication of partial colectomy for diverticulitis   POLYPECTOMY  2013   TA   TONSILLECTOMY     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   Procedure: LEFT TOTAL KNEE ARTHROPLASTY;  Surgeon: Susa Day, MD;  Location: [facility];  Service: Orthopedics;  Laterality: Left;  Adductor Block    Home Medications:  Allergies as of 06/13/2022       Reactions   Haloperidol Anaphylaxis   Aripiprazole Other (See Comments)   Bad thoughts    Chlorpheniramine-pse-ibuprofen    Other reaction(s): Cough   Calcitriol    Simvastatin         Medication List        Accurate as of June 13, 2022 11:30 AM. If you have any questions, ask your nurse or doctor.          acetaminophen 325 MG tablet Commonly known as: TYLENOL Take 650 mg by mouth every 6 (six) hours as needed for mild pain, moderate pain or fever.   amantadine 100 MG capsule Commonly known as: SYMMETREL Take 100 mg by mouth 2 (two) times daily.   antiseptic oral  rinse Liqd 15 mLs by Mouth Rinse route See admin instructions. Use about 15 mL for 30 seconds then spit out up to 5 times daily as needed for dry cough and sore throat.   Breo Ellipta 200-25 MCG/ACT Aepb Generic drug: fluticasone furoate-vilanterol Inhale 1 puff into the lungs daily.   CALCIUM 600+D3 PO Take 1 tablet by mouth daily.   cetirizine 10 MG tablet Commonly known as: ZYRTEC Take 10 mg by mouth daily.   dapagliflozin propanediol 10 MG Tabs tablet Commonly known as: Farxiga Take 1 tablet (10 mg total) by mouth daily before breakfast.   fluticasone 50 MCG/ACT nasal spray Commonly known as: FLONASE Place 2 sprays into both nostrils daily.   furosemide 20 MG tablet Commonly known as: LASIX Take 1 tablet (20 mg total) by mouth daily.   ibuprofen 400 MG tablet Commonly known as: ADVIL Take 400 mg by mouth  every 8 (eight) hours as needed for cramping, moderate pain or fever.   losartan 25 MG tablet Commonly known as: COZAAR Take 0.5 tablets (12.5 mg total) by mouth daily.   melatonin 3 MG Tabs tablet Take 3 mg by mouth at bedtime.   midodrine 5 MG tablet Commonly known as: PROAMATINE Take 5 mg by mouth 3 (three) times daily with meals.   mirtazapine 7.5 MG tablet Commonly known as: REMERON Take 1 tablet (7.5 mg total) by mouth at bedtime.   omeprazole 20 MG capsule Commonly known as: PRILOSEC Take 1 capsule (20 mg total) by mouth daily. What changed: when to take this   risperiDONE 1 MG tablet Commonly known as: RISPERDAL Take 1 tablet (1 mg total) by mouth at bedtime.   spironolactone 25 MG tablet Commonly known as: ALDACTONE Take 0.5 tablets (12.5 mg total) by mouth daily.        Allergies:  Allergies  Allergen Reactions   Haloperidol Anaphylaxis   Aripiprazole Other (See Comments)    Bad thoughts    Chlorpheniramine-Pse-Ibuprofen     Other reaction(s): Cough   Calcitriol    Simvastatin     Family History: Family History  Problem Relation Age of Onset   Hypertension Mother    Heart failure Mother    Hyperlipidemia Mother    Stroke Mother    Hypertension Father    Heart attack Father    COPD Father    Diabetes Paternal Aunt    Diabetes Paternal Grandmother    Colon polyps Other        niece   Stomach cancer Neg Hx    Rectal cancer Neg Hx    Esophageal cancer Neg Hx    Colon cancer Neg Hx     Social History:  reports that she has never smoked. She has never used smokeless tobacco. She reports that she does not currently use alcohol. She reports that she does not use drugs.  ROS: All other review of systems were reviewed and are negative except what is noted above in HPI  Physical Exam: BP 98/62   Pulse 97   Constitutional:  Alert and oriented, No acute distress. HEENT: [place] AT, moist mucus membranes.  Trachea midline, no masses. Cardiovascular:  No clubbing, cyanosis, or edema. Respiratory: Normal respiratory effort, no increased work of breathing. GI: Abdomen is soft, nontender, nondistended, no abdominal masses GU: No CVA tenderness.  Lymph: No cervical or inguinal lymphadenopathy. Skin: No rashes, bruises or suspicious lesions. Neurologic: Grossly intact, no focal deficits, moving all 4 extremities. Psychiatric: Normal mood and affect.  Laboratory Data:  Lab Results  Component Value Date   WBC 7.3 04/24/2022   HGB 11.3 (L) 04/24/2022   HCT 36.3 04/24/2022   MCV 93.8 04/24/2022   PLT 195 04/24/2022    Lab Results  Component Value Date   CREATININE 1.14 (H) 05/24/2022    No results found for: "PSA"  No results found for: "TESTOSTERONE"  No results found for: "HGBA1C"  Urinalysis    Component Value Date/Time   COLORURINE COLORLESS (A) 03/09/2022 0956   APPEARANCEUR CLEAR 03/09/2022 0956   APPEARANCEUR Clear 12/13/2021 0957   LABSPEC 1.005 03/09/2022 0956   PHURINE 5.0 03/09/2022 0956   GLUCOSEU NEGATIVE 03/09/2022 0956   HGBUR NEGATIVE 03/09/2022 0956   BILIRUBINUR NEGATIVE 03/09/2022 0956   BILIRUBINUR Negative 12/13/2021 0957   KETONESUR NEGATIVE 03/09/2022 0956   PROTEINUR NEGATIVE 03/09/2022 0956   UROBILINOGEN 0.2 10/29/2016 1541   UROBILINOGEN 0.2 07/21/2007 2218   NITRITE NEGATIVE 03/09/2022 0956   LEUKOCYTESUR TRACE (A) 03/09/2022 0956    Lab Results  Component Value Date   LABMICR Comment 12/13/2021   BACTERIA NONE SEEN 03/09/2022    Pertinent Imaging: *** Results for orders placed during the hospital encounter of 01/29/22  DG Abd 1 View  Narrative CLINICAL DATA:  Right-sided back pain near kidney for 1 week  EXAM: ABDOMEN - 1 VIEW  COMPARISON:  CT abdomen pelvis, 05/08/2020  FINDINGS: Nonobstructive pattern of bowel gas. No large burden of stool. Multiple rounded densities project in the right upper quadrant adjacent to the colon; suspect that this reflects inspissated  barium within colonic diverticula and not renal stones, particularly given absence of renal calculi on prior examination dated 05/08/2020. No obvious free air on single supine radiograph.  IMPRESSION: 1. Nonobstructive pattern of bowel gas. No large burden of stool. 2. Multiple rounded densities project in the right upper quadrant adjacent to the colon; suspect that this reflects inspissated barium within colonic diverticula and not renal stones, particularly given absence of renal calculi on prior examination dated 05/08/2020. CT may be used to further evaluate for urinary tract calculi and other etiologies of pain if desired.   Electronically Signed By: Delanna Ahmadi M.D. On: 01/31/2022 13:10  No results found for this or any previous visit.  No results found for this or any previous visit.  No results found for this or any previous visit.  No results found for this or any previous visit.  No valid procedures specified. No results found for this or any previous visit.  Results for orders placed during the hospital encounter of 01/29/22  CT RENAL STONE STUDY  Narrative CLINICAL DATA:  Right flank pain. Kidney stones suspected. Right flank pain for 1 week. Recent fall.  EXAM: CT ABDOMEN AND PELVIS WITHOUT CONTRAST  TECHNIQUE: Multidetector CT imaging of the abdomen and pelvis was performed following the standard protocol without IV contrast.  RADIATION DOSE REDUCTION: This exam was performed according to the departmental dose-optimization program which includes automated exposure control, adjustment of the mA and/or kV according to patient size and/or use of iterative reconstruction technique.  COMPARISON:  05/08/2020  FINDINGS: Lower chest: Small bilateral pleural effusions with basilar atelectasis or consolidation, greater on the left. Mild interstitial changes in the lung bases could be due to fibrosis or edema. Cardiac enlargement.  Hepatobiliary:  Multiple simple appearing liver cysts, largest measuring 10.6 cm diameter. No change since prior study. Likely benign etiology. No imaging follow-up is indicated. Surgical absence of the gallbladder. No bile duct dilatation.  Pancreas: Unremarkable. No  pancreatic ductal dilatation or surrounding inflammatory changes.  Spleen: Normal in size without focal abnormality.  Adrenals/Urinary Tract: No adrenal gland nodules. Surgical absence of the left kidney. Right kidney and right ureter unremarkable. Small left anterior bladder diverticulum. No bladder wall thickening or filling defect.  Stomach/Bowel: Stomach, small bowel, and colon are not abnormally distended. Scattered gas and stool in the colon. No wall thickening or inflammatory changes. Appendix is not identified.  Vascular/Lymphatic: Aortic atherosclerosis. No enlarged abdominal or pelvic lymph nodes.  Reproductive: Uterus and ovaries are not enlarged. Gas and fluid are demonstrated in the vagina. Nonspecific etiology, possibly infection, obstruction, or hemorrhage. Correlate with physical examination.  Other: No free air or free fluid in the abdomen. Laxity of the abdominal wall musculature without discrete herniation. Scarring along the midline is consistent with postoperative change.  Musculoskeletal: Degenerative changes in the spine and hips. No acute bony abnormalities.  IMPRESSION: 1. Small bilateral pleural effusions with basilar atelectasis or consolidation, greater on the left. Changes could represent pneumonia in the appropriate clinical setting. 2. Mild interstitial changes in the lung bases could indicate fibrosis or edema. 3. Cardiac enlargement. 4. Multiple benign-appearing liver cysts are unchanged. No imaging follow-up is indicated. 5. Surgical absence of the left kidney. No right renal or ureteral stone or obstruction. 6. Small bladder diverticulum.  No bladder wall thickening. 7. Aortic  atherosclerosis. 8. Gas and fluid demonstrated in the vagina. Nonspecific etiology but could indicate infection, obstruction, or hemorrhage. Correlate with physical examination.   Electronically Signed By: Lucienne Capers M.D. On: 02/01/2022 20:09   Assessment & Plan:    1. Urinary retention -resolved - Urinalysis, Routine w reflex microscopic - BLADDER SCAN AMB NON-IMAGING  2. Gross hematuria -resolved   No follow-ups on file.  Nicolette Bang, MD  [facility] [place]

## 2022-06-14 ENCOUNTER — Encounter: Payer: Self-pay | Admitting: Family

## 2022-06-14 ENCOUNTER — Ambulatory Visit: Payer: 59 | Attending: Family | Admitting: Family

## 2022-06-14 VITALS — BP 110/53 | HR 89 | Resp 16 | Wt 174.4 lb

## 2022-06-14 DIAGNOSIS — J90 Pleural effusion, not elsewhere classified: Secondary | ICD-10-CM | POA: Insufficient documentation

## 2022-06-14 DIAGNOSIS — I5022 Chronic systolic (congestive) heart failure: Secondary | ICD-10-CM | POA: Diagnosis not present

## 2022-06-14 DIAGNOSIS — F209 Schizophrenia, unspecified: Secondary | ICD-10-CM | POA: Diagnosis present

## 2022-06-14 DIAGNOSIS — J441 Chronic obstructive pulmonary disease with (acute) exacerbation: Secondary | ICD-10-CM | POA: Insufficient documentation

## 2022-06-14 DIAGNOSIS — I447 Left bundle-branch block, unspecified: Secondary | ICD-10-CM | POA: Insufficient documentation

## 2022-06-14 DIAGNOSIS — I130 Hypertensive heart and chronic kidney disease with heart failure and stage 1 through stage 4 chronic kidney disease, or unspecified chronic kidney disease: Secondary | ICD-10-CM | POA: Diagnosis present

## 2022-06-14 DIAGNOSIS — Z79899 Other long term (current) drug therapy: Secondary | ICD-10-CM | POA: Diagnosis not present

## 2022-06-14 DIAGNOSIS — F419 Anxiety disorder, unspecified: Secondary | ICD-10-CM | POA: Diagnosis present

## 2022-06-14 DIAGNOSIS — K219 Gastro-esophageal reflux disease without esophagitis: Secondary | ICD-10-CM | POA: Insufficient documentation

## 2022-06-14 MED ORDER — SACUBITRIL-VALSARTAN 24-26 MG PO TABS: 1.0000 | ORAL_TABLET | Freq: Two times a day (BID) | ORAL | 3 refills | Status: DC

## 2022-06-14 NOTE — Progress Notes (Signed)
Patient ID: [name], female    DOB: [birth year], 82 y.o.   MRN: [id number]  HPI  [name] is a 82 y/o female with a history of asthma, HTN, CKD, anemia, anxiety, COPD, diverticulosis, GERD, schizophrenia and chronic heart failure.   Echo 01/30/22 showed EF of 30-35% along with moderate LAE, mild/ moderate MR and moderate left pleural effusion.   Cardiac MRI 03/06/22: Several cystic liver lesions noted, largest 3.7 cm in diameter.  Moderate bilateral pleural effusions.  Trivial pericardial effusion. Moderately dilated left ventricle with normal wall thickness. Septal-lateral dyssynchrony consistent with LBBB, global hypokinesis with EF 23%. Normal right ventricular size with mildly reduced systolic function, EF [id number]. Mild left atrial enlargement, normal right atrial size. Visually, there is at least mild mitral regurgitation. By flow analysis, there is no significant mitral regurgitation. Trileaflet aortic valve, no signficant stenosis, trivial aortic insufficiency.  Admitted 04/23/22 due to COPD exacerbation and fall. Home O2 was set up. Admitted 03/09/22 due to SOB due to HF exacerbation.    She presents today for a HF f/u visit with a chief complaint of minimal fatigue upon moderate exertion. Describes this as chronic in nature. Has associated SOB, cough (improving)j, light-headedness and diarrhea yesterday along with this. Denies any difficulty sleeping, abdominal distention, palpitations, pedal edema, chest pain or weight gain.   Says that she had quite a bit of diarrhea yesterday but this has resolved today. Staff member present says that her facility has ~ 12 residents with similar symptoms.   Past Medical History:  Diagnosis Date   Anemia 1990   hx of   Anxiety    Arthritis 2004   Asthma 1995   uses inhaler   CHF (congestive heart failure) (HCC)    Chronic HFrEF (heart failure with reduced ejection fraction) ([place])    a. 07/2021 Echo: EF 25%; b. 01/2022 Echo: EF  30-35%, glob HK, GrI DD, nl RV fxn, mild-mod MR; c. 02/2022 cMRI: EF 23%.   CKD (chronic kidney disease), stage III ([place]) 2010   a. Solitary kidney - s/p L nephrectomy in 2010.   Colon polyp    Complication of anesthesia 1966   first surgery she had- had a hard time waking up from surgery   COPD (chronic obstructive pulmonary disease) ([place])    Delusional disorder ([place])    Six delusional disorder   Diverticulosis 2003   GERD (gastroesophageal reflux disease)    on meds   Hypertension    NICM (nonischemic cardiomyopathy) ([place])    a. 07/2021 Echo: EF 25%; b. 11/2021 [facility] PET/CT: EF 28% (35% w/ stress). Cor Ca2+. No ischemia; c.01/2022 Echo: EF 30-35%, glob HK, GrI DD, nl RV fxn, mild-mod MR; d. 02/2022 cMRI: EF 23%, diff HK, septal-lateral dyssynchrony w/ LBBB. Nonspecific RV insertion site LGE, borderline elev extracellular volume percentage sugg some replacement fibrosis-->NICM.   Schizophrenia ([place])    Severe left ventricular systolic dysfunction (LVSD)    EF 23% by cardiac MRI   Past Surgical History:  Procedure Laterality Date   APPENDECTOMY  1966   CATARACT EXTRACTION     Bilateral   CHOLECYSTECTOMY  2004   COLON SURGERY  2008   COLONOSCOPY  2013   JP-MAC-moviprep(good)-TA   COLOSTOMY     COLOSTOMY TAKEDOWN     DIAGNOSTIC LAPAROSCOPY     exploratory for endometriosis   HEEL SPUR SURGERY     Right, Metal Plate in [place]  2010   NEPHRECTOMY Left 2008  As complication of partial colectomy for diverticulitis   POLYPECTOMY  2013   TA   TONSILLECTOMY     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   Procedure: LEFT TOTAL KNEE ARTHROPLASTY;  Surgeon: Susa Day, MD;  Location: [facility];  Service: Orthopedics;  Laterality: Left;  Adductor Block   Family History  Problem Relation Age of Onset   Hypertension Mother    Heart failure Mother    Hyperlipidemia Mother    Stroke Mother    Hypertension Father    Heart attack Father    COPD Father    Diabetes  Paternal Aunt    Diabetes Paternal Grandmother    Colon polyps Other        niece   Stomach cancer Neg Hx    Rectal cancer Neg Hx    Esophageal cancer Neg Hx    Colon cancer Neg Hx    Social History   Tobacco Use   Smoking status: Never   Smokeless tobacco: Never  Substance Use Topics   Alcohol use: Not Currently    Comment: occassionally   Allergies  Allergen Reactions   Haloperidol Anaphylaxis   Aripiprazole Other (See Comments)    Bad thoughts    Chlorpheniramine-Pse-Ibuprofen     Other reaction(s): Cough   Calcitriol    Simvastatin    Prior to Admission medications   Medication Sig Start Date End Date Taking? Authorizing Provider  acetaminophen (TYLENOL) 325 MG tablet Take 650 mg by mouth every 6 (six) hours as needed for mild pain, moderate pain or fever.   Yes [provider]  amantadine (SYMMETREL) 100 MG capsule Take 100 mg by mouth 2 (two) times daily. 05/03/21  Yes [provider]  antiseptic oral rinse (BIOTENE) LIQD 15 mLs by Mouth Rinse route See admin instructions. Use about 15 mL for 30 seconds then spit out up to 5 times daily as needed for dry cough and sore throat.   Yes [provider]  BREO ELLIPTA 200-25 MCG/INH AEPB Inhale 1 puff into the lungs daily. 06/22/20  Yes [provider]  Calcium Carb-Cholecalciferol (CALCIUM 600+D3 PO) Take 1 tablet by mouth daily.   Yes [provider]  cetirizine (ZYRTEC) 10 MG tablet Take 10 mg by mouth daily.   Yes [provider]  dapagliflozin propanediol (FARXIGA) 10 MG TABS tablet Take 1 tablet (10 mg total) by mouth daily before breakfast. 05/24/22  Yes Larey Dresser, MD  fluticasone [facility]) 50 MCG/ACT nasal spray Place 2 sprays into both nostrils daily. 05/02/21  Yes [provider]  furosemide (LASIX) 20 MG tablet Take 1 tablet (20 mg total) by mouth daily. 03/13/22  Yes Tat, Shanon Brow, MD  ibuprofen (ADVIL) 400 MG tablet Take 400 mg by mouth every 8 (eight)  hours as needed for cramping, moderate pain or fever.   Yes [provider]  melatonin 3 MG TABS tablet Take 3 mg by mouth at bedtime.   Yes [provider]  midodrine (PROAMATINE) 5 MG tablet Take 5 mg by mouth 3 (three) times daily with meals.   Yes [provider]  mirtazapine (REMERON) 7.5 MG tablet Take 1 tablet (7.5 mg total) by mouth at bedtime. 01/23/22  Yes Thurnell Lose, MD  omeprazole (PRILOSEC) 20 MG capsule Take 1 capsule (20 mg total) by mouth daily. Patient taking differently: Take 20 mg by mouth 2 (two) times daily. 06/19/19  Yes Palumbo, April, MD  risperiDONE (RISPERDAL) 1 MG tablet Take 1 tablet (1 mg total)  by mouth at bedtime. 10/27/21  Yes Johnson, Clanford L, MD  spironolactone (ALDACTONE) 25 MG tablet Take 0.5 tablets (12.5 mg total) by mouth daily. 05/24/22  Yes Larey Dresser, MD    Review of Systems  Constitutional:  Positive for fatigue. Negative for appetite change.  HENT:  Negative for congestion, postnasal drip and sore throat.   Eyes: Negative.   Respiratory:  Positive for cough (improving) and shortness of breath. Negative for chest tightness.   Cardiovascular:  Negative for chest pain, palpitations and leg swelling.  Gastrointestinal:  Positive for diarrhea (yesterday). Negative for abdominal distention and abdominal pain.  Endocrine: Negative.   Genitourinary: Negative.   Musculoskeletal:  Negative for back pain and neck pain.  Skin: Negative.   Allergic/Immunologic: Negative.   Neurological:  Positive for light-headedness. Negative for dizziness.  Hematological:  Negative for adenopathy. Bruises/bleeds easily.  Psychiatric/Behavioral:  Negative for dysphoric mood and sleep disturbance (sleeping on 2 pillow; oxygen while asleep). The patient is not nervous/anxious.    Vitals:   06/14/22 1126  BP: (!) 110/53  Pulse: 89  Resp: 16  SpO2: 96%  Weight: 174 lb 6 oz (79.1 kg)   Wt Readings from Last 3 Encounters:  06/14/22  174 lb 6 oz (79.1 kg)  05/24/22 177 lb 8 oz (80.5 kg)  05/10/22 177 lb 4 oz (80.4 kg)   Lab Results  Component Value Date   CREATININE 1.14 (H) 05/24/2022   CREATININE 1.03 (H) 04/24/2022   CREATININE 1.03 (H) 04/23/2022   Physical Exam Vitals and nursing note reviewed. Exam conducted with a chaperone present (caregiver).  Constitutional:      Appearance: Normal appearance.  HENT:     Head: Normocephalic and atraumatic.  Cardiovascular:     Rate and Rhythm: Normal rate and regular rhythm.  Pulmonary:     Effort: Pulmonary effort is normal.     Breath sounds: No wheezing, rhonchi or rales.  Abdominal:     General: There is no distension.     Palpations: Abdomen is soft.     Tenderness: There is no abdominal tenderness.  Musculoskeletal:        General: No tenderness.     Cervical back: Normal range of motion and neck supple.     Right lower leg: No edema.     Left lower leg: No edema.  Skin:    General: Skin is warm and dry.  Neurological:     General: No focal deficit present.     Mental Status: She is alert and oriented to person, place, and time.  Psychiatric:        Mood and Affect: Mood normal.        Behavior: Behavior normal.   Assessment & Plan:  1: Chronic heart failure with reduced ejection fraction- - NYHA class II - euvolemic  - being weighed daily; reminded to call for an overnight weight gain of > 2 pounds or a weekly weight gain of > 5 pounds - weight down 3 pounds from last visit here 2 weeks ago - echo (4/23): EF 25%; echo (10/23): EF 30-35%, normal RV, mild-moderate MR - cardiac PET (8/23): EF 28%, no ischemia, coronary calcifications noted.  - cardiac MRI (11/23): LVEF 23%, moderate LV dilation, septal-lateral dyssynchrony, RVEF 46%, nonspecific RV insertion site LGE (consistent with NICM). - adds the "small" packet of salt to some foods and likes to eat oysters with cocktail sauce; reviewed not eating the sauce and to use pepper on her food -  drinking tea and water during the day - losartan 12.'5mg'$  daily; stop this and begin entresto 24/'26mg'$  BID - spironolactone 12.'5mg'$  daily - farxiga '10mg'$  daily - furosemide '20mg'$  daily - BMP today; order for facility to draw this and fax results to [place] - saw [facility] provider Aundra Dubin) 05/24/22; returns in 3 weeks - saw cardiology Sharolyn Douglas) 03/28/22 - BNP 05/24/22 was 720.9  2: HTN- - BP 110/53 - currently seeing PCP at facility - [facility] 05/24/22 showed sodium 131, potassium 4.1, creatinine 1.14 & GFR 49  3: COPD- - combivent PRN  4: Schizophrenia- - risperidone '1mg'$  QHS - amantadine '100mg'$  BID - mirtazapine 7.'5mg'$  QHS   Facility medication list reviewed.   Return in 3 weeks, sooner if needed.

## 2022-06-14 NOTE — Patient Instructions (Addendum)
Stop taking Losartan  Start taking Entresto 24/26 mg (1 tablet) two times a day.  You have  a Follow up appointment with Dr. Aundra Dubin in 3 weeks.    Labs done today, your results will be available in MyChart, we will contact you for abnormal readings.  Fax labs to HF clinic  to  [phone]   Do the following things EVERYDAY: Weigh yourself in the morning before breakfast. Write it down and keep it in a log. Take your medicines as prescribed Eat low salt foods--Limit salt (sodium) to 2000 mg per day.  Stay as active as you can everyday Limit all fluids for the day to less than 2 liters

## 2022-06-15 ENCOUNTER — Other Ambulatory Visit (HOSPITAL_COMMUNITY): Payer: Self-pay | Admitting: Cardiology

## 2022-06-18 ENCOUNTER — Telehealth: Payer: Self-pay | Admitting: Family

## 2022-06-18 LAB — BASIC METABOLIC PANEL
BUN/Creatinine Ratio: 24 (ref 12–28)
BUN: 26 mg/dL (ref 8–27)
CO2: 23 mmol/L (ref 20–29)
Calcium: 8.9 mg/dL (ref 8.7–10.3)
Chloride: 100 mmol/L (ref 96–106)
Creatinine, Ser: 1.09 mg/dL — ABNORMAL HIGH (ref 0.57–1.00)
Glucose: 96 mg/dL (ref 70–99)
Potassium: 4.1 mmol/L (ref 3.5–5.2)
Sodium: 138 mmol/L (ref 134–144)
eGFR: 51 mL/min/{1.73_m2} — ABNORMAL LOW (ref >59)

## 2022-06-18 LAB — SPECIMEN STATUS REPORT

## 2022-06-18 NOTE — Telephone Encounter (Signed)
BMP dated 06/15/22:   Creatinine 1.09 BUN 26 eGFR 51 Sodium 138 Potassium 4.1

## 2022-07-19 ENCOUNTER — Other Ambulatory Visit
Admission: RE | Admit: 2022-07-19 | Discharge: 2022-07-19 | Disposition: A | Discharge status: Home / Self Care | Payer: 59 | Source: Ambulatory Visit | Attending: Cardiology | Admitting: Cardiology

## 2022-07-19 ENCOUNTER — Encounter: Payer: Self-pay | Admitting: Cardiology

## 2022-07-19 ENCOUNTER — Ambulatory Visit (HOSPITAL_BASED_OUTPATIENT_CLINIC_OR_DEPARTMENT_OTHER): Payer: 59 | Admitting: Cardiology

## 2022-07-19 VITALS — BP 93/60 | HR 83 | Resp 14 | Wt 179.5 lb

## 2022-07-19 DIAGNOSIS — I5022 Chronic systolic (congestive) heart failure: Secondary | ICD-10-CM | POA: Diagnosis present

## 2022-07-19 LAB — BASIC METABOLIC PANEL
Anion gap: 11 (ref 5–15)
BUN: 32 mg/dL — ABNORMAL HIGH (ref 8–23)
CO2: 22 mmol/L (ref 22–32)
Calcium: 8.6 mg/dL — ABNORMAL LOW (ref 8.9–10.3)
Chloride: 100 mmol/L (ref 98–111)
Creatinine, Ser: 1.29 mg/dL — ABNORMAL HIGH (ref 0.44–1.00)
GFR, Estimated: 42 mL/min — ABNORMAL LOW (ref >60)
Glucose, Bld: 112 mg/dL — ABNORMAL HIGH (ref 70–99)
Potassium: 4.7 mmol/L (ref 3.5–5.1)
Sodium: 133 mmol/L — ABNORMAL LOW (ref 135–145)

## 2022-07-19 LAB — BRAIN NATRIURETIC PEPTIDE: B Natriuretic Peptide: 263.1 pg/mL — ABNORMAL HIGH (ref 0.0–100.0)

## 2022-07-19 NOTE — Patient Instructions (Signed)
STOP Entresto  STOP Midodrine  CONTINUE Losartan 25mg  daily  Routine lab work today. Will notify you of abnormal results  Follow up in 3 weeks  Do the following things EVERYDAY: Weigh yourself in the morning before breakfast. Write it down and keep it in a log. Take your medicines as prescribed Eat low salt foods--Limit salt (sodium) to 2000 mg per day.  Stay as active as you can everyday Limit all fluids for the day to less than 2 liters

## 2022-07-19 NOTE — Progress Notes (Signed)
PCP: Christene Lye, FNP Cardiology: Dr. Mayford Knife HF Cardiology: Dr. Shirlee Latch  82 y.o. with history of CKD stage 3, schizophrenia, and chronic systolic CHF was referred by Dr. Mayford Knife for evaluation of CHF.  She lives in an assisted living facility.  She does not smoker or drink ETOH.  Patient has been known to have a cardiomyopathy since 4/23.  Echo at that time showed EF 25%.  She had a cardiac PET done in 8/23 that showed no ischemia.  Echo in 10/23 showed that EF remained low at 30-35%, and cardiac MRI showed  LVEF 23%, moderate LV dilation, septal-lateral dyssynchrony, RVEF 46%, nonspecific RV insertion site LGE (consistent with NICM).   Patient appears to be taking losartan, Entresto, and midodrine currently.  Her BP is 93/60.  She denies lightheadedness.  Says she "feels good." She does a lot of walking at her facility, has long halls to walk to get to meals.  No significant exertional dyspnea.  Weight stable.  No chest pain.  No orthopnea/PND.    Labs (1/24): K 4, creatinine 1.03, BNP 883 Labs (3/24): K 4.1, creatinine 1.09  PMH: 1. Asthma 2. CKD stage 3 3. Schizophrenia 4. H/o left nephrectomy 5. Subdural hematoma: 10/23, due to fall.  6. Chronic systolic CHF: Nonischemic cardiomyopathy.  - Echo (4/23): EF 25% - Echo (10/23): EF 30-35%, normal RV, mild-moderate MR - Cardiac PET (8/23): EF 28%, no ischemia, coronary calcifications noted.  - Cardiac MRI (11/23): LVEF 23%, moderate LV dilation, septal-lateral dyssynchrony, RVEF 46%, nonspecific RV insertion site LGE (consistent with NICM).  7. LBBB 8. COVID-19 x 3  Social History   Socioeconomic History   Marital status: Legally Separated    Spouse name: Not on file   Number of children: 10   Years of education: Not on file   Highest education level: Not on file  Occupational History   Occupation: retired  Tobacco Use   Smoking status: Never   Smokeless tobacco: Never  Vaping Use   Vaping Use: Never used  Substance and Sexual  Activity   Alcohol use: Not Currently    Comment: occassionally   Drug use: No   Sexual activity: Not Currently  Other Topics Concern   Not on file  Social History Narrative   Not on file   Social Determinants of Health   Financial Resource Strain: Not on file  Food Insecurity: No Food Insecurity (04/23/2022)   Hunger Vital Sign    Worried About Running Out of Food in the Last Year: Never true    Ran Out of Food in the Last Year: Never true  Transportation Needs: No Transportation Needs (04/23/2022)   PRAPARE - Administrator, Civil Service (Medical): No    Lack of Transportation (Non-Medical): No  Physical Activity: Not on file  Stress: Not on file  Social Connections: Not on file  Intimate Partner Violence: Not At Risk (04/23/2022)   Humiliation, Afraid, Rape, and Kick questionnaire    Fear of Current or Ex-Partner: No    Emotionally Abused: No    Physically Abused: No    Sexually Abused: No   Family History  Problem Relation Age of Onset   Hypertension Mother    Heart failure Mother    Hyperlipidemia Mother    Stroke Mother    Hypertension Father    Heart attack Father    COPD Father    Diabetes Paternal Aunt    Diabetes Paternal Grandmother    Colon polyps Other  niece   Stomach cancer Neg Hx    Rectal cancer Neg Hx    Esophageal cancer Neg Hx    Colon cancer Neg Hx    ROS: All systems reviewed and negative except as per HPI.   Current Outpatient Medications  Medication Sig Dispense Refill   acetaminophen (TYLENOL) 325 MG tablet Take 650 mg by mouth every 6 (six) hours as needed for mild pain, moderate pain or fever.     amantadine (SYMMETREL) 100 MG capsule Take 100 mg by mouth 2 (two) times daily.     antiseptic oral rinse (BIOTENE) LIQD 15 mLs by Mouth Rinse route See admin instructions. Use about 15 mL for 30 seconds then spit out up to 5 times daily as needed for dry cough and sore throat.     BREO ELLIPTA 200-25 MCG/INH AEPB Inhale 1  puff into the lungs daily.     Calcium Carb-Cholecalciferol (CALCIUM 600+D3 PO) Take 1 tablet by mouth daily.     cetirizine (ZYRTEC) 10 MG tablet Take 10 mg by mouth daily.     dapagliflozin propanediol (FARXIGA) 10 MG TABS tablet Take 1 tablet (10 mg total) by mouth daily before breakfast. 90 tablet 3   fluticasone (FLONASE) 50 MCG/ACT nasal spray Place 2 sprays into both nostrils daily.     furosemide (LASIX) 20 MG tablet Take 1 tablet (20 mg total) by mouth daily. 30 tablet 1   ibuprofen (ADVIL) 400 MG tablet Take 400 mg by mouth every 8 (eight) hours as needed for cramping, moderate pain or fever.     losartan (COZAAR) 25 MG tablet Take 25 mg by mouth daily.     melatonin 3 MG TABS tablet Take 3 mg by mouth at bedtime.     mirtazapine (REMERON) 7.5 MG tablet Take 1 tablet (7.5 mg total) by mouth at bedtime. 30 tablet 0   omeprazole (PRILOSEC) 20 MG capsule Take 1 capsule (20 mg total) by mouth daily. (Patient taking differently: Take 20 mg by mouth 2 (two) times daily.) 30 capsule 0   risperiDONE (RISPERDAL) 1 MG tablet Take 1 tablet (1 mg total) by mouth at bedtime. 30 tablet 1   spironolactone (ALDACTONE) 25 MG tablet Take 0.5 tablets (12.5 mg total) by mouth daily. 45 tablet 3   No current facility-administered medications for this visit.   BP 93/60 (BP Location: Right Arm, Patient Position: Sitting, Cuff Size: Large)   Pulse 83   Resp 14   Wt 179 lb 8 oz (81.4 kg)   SpO2 96%   BMI 32.31 kg/m  General: NAD Neck: No JVD, no thyromegaly or thyroid nodule.  Lungs: Clear to auscultation bilaterally with normal respiratory effort. CV: Nondisplaced PMI.  Heart regular S1/S2, no S3/S4, no murmur.  No peripheral edema.  No carotid bruit.  Normal pedal pulses.  Abdomen: Soft, nontender, no hepatosplenomegaly, no distention.  Skin: Intact without lesions or rashes.  Neurologic: Alert and oriented x 3.  Psych: Normal affect. Extremities: No clubbing or cyanosis.  HEENT: Normal.    Assessment/Plan: 1. Chronic systolic CHF:  Diagnosed in 4/23 with echo showed EF 25%.  Suspect nonischemic cardiomyopathy, cardiac PET showed no ischemia and cardiac MRI did not show evidence for MI.  No ETOH history.  Mother with HF in her 4770s.  ?post-viral myocarditis, had 3 bouts with COVID-19.  Last echo in 10/23 showeed EF 30-35% withj normal RV.  Cardiac MRI in 11/23 showed LVEF 23%, moderate LV dilation, septal-lateral dyssynchrony, RVEF 46%, nonspecific RV insertion  site LGE (consistent with NICM). NYHA class II symptoms.  She is not volume overloaded on exam.  GDMT titration has apparently been limited by orthostasis, but she denies current orthostatic symptoms.  She is unfortunately taking losartan, Entresto, and midodrine.  - She can continue losartan 25 mg daily but I will have her stop Entresto and midodrine.  If BP remains stable, she can transition from losartan to [facility] in the future.  - Continue Lasix 20 mg daily.  - Continue spironolactone 12.5 daily.  - Continue Farxiga 10 mg daily.  - BMET/BNP today.  - Repeat echo in a few months after as much med titration as we can do.  If EF remains low, would refer for EP evaluation for CRT.  She has a wide LBBB-like IVCD and I think she would benefit. At her age and with NICM, ICD probably would not have much utility.  2. Schizophrenia: Appropriate with conversation today.   Followup with APP 3 wks.   Marca Ancona 07/19/2022

## 2022-07-26 ENCOUNTER — Encounter: Payer: Self-pay | Admitting: Podiatry

## 2022-07-26 ENCOUNTER — Ambulatory Visit (INDEPENDENT_AMBULATORY_CARE_PROVIDER_SITE_OTHER): Payer: 59 | Admitting: Podiatry

## 2022-07-26 DIAGNOSIS — M79675 Pain in left toe(s): Secondary | ICD-10-CM | POA: Diagnosis not present

## 2022-07-26 DIAGNOSIS — M79674 Pain in right toe(s): Secondary | ICD-10-CM

## 2022-07-26 DIAGNOSIS — B351 Tinea unguium: Secondary | ICD-10-CM

## 2022-07-26 NOTE — Progress Notes (Signed)
She presents today after having seen Dr. Stacie Acres back in 2022 with a chief complaint of painful ingrown toenails on the hallux right.  She states that she like to have the rest of her nails trimmed.  Objective: Vital signs are stable alert and oriented x 3.  Toenails are long thick yellow dystrophic onychomycotic painful palpation.  Assessment: Pain limb secondary onychomycosis.  Plan: Debridement of toenails 1 through 5 bilateral.

## 2022-08-08 ENCOUNTER — Ambulatory Visit: Payer: 59 | Attending: Family | Admitting: Family

## 2022-08-08 ENCOUNTER — Encounter: Payer: Self-pay | Admitting: Family

## 2022-08-08 VITALS — BP 109/71 | HR 86 | Wt 182.0 lb

## 2022-08-08 DIAGNOSIS — I10 Essential (primary) hypertension: Secondary | ICD-10-CM

## 2022-08-08 DIAGNOSIS — I447 Left bundle-branch block, unspecified: Secondary | ICD-10-CM | POA: Diagnosis not present

## 2022-08-08 DIAGNOSIS — I5022 Chronic systolic (congestive) heart failure: Secondary | ICD-10-CM | POA: Insufficient documentation

## 2022-08-08 DIAGNOSIS — N183 Chronic kidney disease, stage 3 unspecified: Secondary | ICD-10-CM | POA: Insufficient documentation

## 2022-08-08 DIAGNOSIS — I130 Hypertensive heart and chronic kidney disease with heart failure and stage 1 through stage 4 chronic kidney disease, or unspecified chronic kidney disease: Secondary | ICD-10-CM | POA: Diagnosis not present

## 2022-08-08 DIAGNOSIS — F209 Schizophrenia, unspecified: Secondary | ICD-10-CM | POA: Insufficient documentation

## 2022-08-08 DIAGNOSIS — Z79899 Other long term (current) drug therapy: Secondary | ICD-10-CM | POA: Diagnosis not present

## 2022-08-08 DIAGNOSIS — F419 Anxiety disorder, unspecified: Secondary | ICD-10-CM | POA: Diagnosis not present

## 2022-08-08 DIAGNOSIS — Z7984 Long term (current) use of oral hypoglycemic drugs: Secondary | ICD-10-CM | POA: Diagnosis not present

## 2022-08-08 DIAGNOSIS — F200 Paranoid schizophrenia: Secondary | ICD-10-CM | POA: Diagnosis not present

## 2022-08-08 DIAGNOSIS — J449 Chronic obstructive pulmonary disease, unspecified: Secondary | ICD-10-CM

## 2022-08-08 DIAGNOSIS — R5383 Other fatigue: Secondary | ICD-10-CM | POA: Diagnosis present

## 2022-08-08 DIAGNOSIS — J441 Chronic obstructive pulmonary disease with (acute) exacerbation: Secondary | ICD-10-CM | POA: Insufficient documentation

## 2022-08-08 DIAGNOSIS — J90 Pleural effusion, not elsewhere classified: Secondary | ICD-10-CM | POA: Diagnosis not present

## 2022-08-08 DIAGNOSIS — K219 Gastro-esophageal reflux disease without esophagitis: Secondary | ICD-10-CM | POA: Diagnosis not present

## 2022-08-08 MED ORDER — SACUBITRIL-VALSARTAN 24-26 MG PO TABS: 1.0000 | ORAL_TABLET | Freq: Two times a day (BID) | ORAL | 3 refills | Status: DC

## 2022-08-08 NOTE — Progress Notes (Signed)
Patient ID: KAREEM AUL, female    DOB: 12-31-1940, 82 y.o.   MRN: [id number]  Primary cardiologist: Nicolasa Ducking, NP (last seen 12/23) PCP: Christene Lye, FNP (at facility) HF cardiologist: Marca Ancona, MD (last seen 04/24)  HPI  [name] is a 82 y/o female with a history of asthma, HTN, CKD, anemia, anxiety, COPD, diverticulosis, GERD, schizophrenia and chronic heart failure.   Echo 01/30/22: EF of 30-35% along with moderate LAE, mild/ moderate MR and moderate left pleural effusion.   Cardiac MRI 03/06/22: Several cystic liver lesions noted, largest 3.7 cm in diameter.  Moderate bilateral pleural effusions.  Trivial pericardial effusion. Moderately dilated left ventricle with normal wall thickness. Septal-lateral dyssynchrony consistent with LBBB, global hypokinesis with EF 23%. Normal right ventricular size with mildly reduced systolic function, EF 46%. Mild left atrial enlargement, normal right atrial size. Visually, there is at least mild mitral regurgitation. By flow analysis, there is no significant mitral regurgitation. Trileaflet aortic valve, no signficant stenosis, trivial aortic insufficiency.  Cardiac PET 08/23: no ischemia  Admitted 04/23/22 due to COPD exacerbation and fall. Home O2 was set up. Admitted 03/09/22 due to SOB due to HF exacerbation.    [name] presents today for a HF f/u visit with a chief complaint of minimal fatigue with moderate exertion. Chronic in nature. Has associated cough & easy bruising along with this. Denies difficulty sleeping, abdominal distention, palpitations, pedal edema, chest pain, SOB, dizziness or weight gain.   Did eat fried foods yesterday.  At last HF visit, [name] was unfortunately taking both losartan and entresto as well as midodrine. Entresto and midodrine were both stopped.   Past Medical History:  Diagnosis Date   Anemia 1990   hx of   Anxiety    Arthritis 2004   Asthma 1995   uses inhaler   CHF (congestive  heart failure) (HCC)    Chronic HFrEF (heart failure with reduced ejection fraction) (HCC)    a. 07/2021 Echo: EF 25%; b. 01/2022 Echo: EF 30-35%, glob HK, GrI DD, nl RV fxn, mild-mod MR; c. 02/2022 cMRI: EF 23%.   CKD (chronic kidney disease), stage III (HCC) 2010   a. Solitary kidney - s/p L nephrectomy in 2010.   Colon polyp    Complication of anesthesia 1966   first surgery [name] had- had a hard time waking up from surgery   COPD (chronic obstructive pulmonary disease) (HCC)    Delusional disorder (HCC)    Six delusional disorder   Diverticulosis 2003   GERD (gastroesophageal reflux disease)    on meds   Hypertension    NICM (nonischemic cardiomyopathy) (HCC)    a. 07/2021 Echo: EF 25%; b. 11/2021 Lexi PET/CT: EF 28% (35% w/ stress). Cor Ca2+. No ischemia; c.01/2022 Echo: EF 30-35%, glob HK, GrI DD, nl RV fxn, mild-mod MR; d. 02/2022 cMRI: EF 23%, diff HK, septal-lateral dyssynchrony w/ LBBB. Nonspecific RV insertion site LGE, borderline elev extracellular volume percentage sugg some replacement fibrosis-->NICM.   Schizophrenia (HCC)    Severe left ventricular systolic dysfunction (LVSD)    EF 23% by cardiac MRI   Past Surgical History:  Procedure Laterality Date   APPENDECTOMY  1966   CATARACT EXTRACTION     Bilateral   CHOLECYSTECTOMY  2004   COLON SURGERY  2008   COLONOSCOPY  2013   JP-MAC-moviprep(good)-TA   COLOSTOMY     COLOSTOMY TAKEDOWN     DIAGNOSTIC LAPAROSCOPY     exploratory for endometriosis   HEEL SPUR SURGERY  Right, Metal Plate in Heel   INCISIONAL HERNIA REPAIR  2010   NEPHRECTOMY Left 2008   As complication of partial colectomy for diverticulitis   POLYPECTOMY  2013   TA   TONSILLECTOMY     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   Procedure: LEFT TOTAL KNEE ARTHROPLASTY;  Surgeon: Jene Every, MD;  Location: [facility];  Service: Orthopedics;  Laterality: Left;  Adductor Block   Family History  Problem Relation Age of Onset   Hypertension Mother     Heart failure Mother    Hyperlipidemia Mother    Stroke Mother    Hypertension Father    Heart attack Father    COPD Father    Diabetes Paternal Aunt    Diabetes Paternal Grandmother    Colon polyps Other        niece   Stomach cancer Neg Hx    Rectal cancer Neg Hx    Esophageal cancer Neg Hx    Colon cancer Neg Hx    Social History   Tobacco Use   Smoking status: Never   Smokeless tobacco: Never  Substance Use Topics   Alcohol use: Not Currently    Comment: occassionally   Allergies  Allergen Reactions   Haloperidol Anaphylaxis   Aripiprazole Other (See Comments)    Bad thoughts    Chlorpheniramine-Pse-Ibuprofen     Other reaction(s): Cough   Calcitriol    Simvastatin    Prior to Admission medications   Medication Sig Start Date End Date Taking? Authorizing Provider  amantadine (SYMMETREL) 100 MG capsule Take 100 mg by mouth 2 (two) times daily. 05/03/21  Yes [provider]  BREO ELLIPTA 200-25 MCG/INH AEPB Inhale 1 puff into the lungs daily. 06/22/20  Yes [provider]  Calcium Carb-Cholecalciferol (CALCIUM 600+D3 PO) Take 1 tablet by mouth daily.   Yes [provider]  cetirizine (ZYRTEC) 10 MG tablet Take 10 mg by mouth daily.   Yes [provider]  dapagliflozin propanediol (FARXIGA) 10 MG TABS tablet Take 1 tablet (10 mg total) by mouth daily before breakfast. 05/24/22  Yes Laurey Morale, MD  fluticasone [facility]) 50 MCG/ACT nasal spray Place 2 sprays into both nostrils daily. 05/02/21  Yes [provider]  furosemide (LASIX) 20 MG tablet Take 1 tablet (20 mg total) by mouth daily. 03/13/22  Yes Tat, Onalee Hua, MD  ibuprofen (ADVIL) 400 MG tablet Take 400 mg by mouth every 8 (eight) hours as needed for cramping, moderate pain or fever.   Yes [provider]  losartan (COZAAR) 25 MG tablet Take 25 mg by mouth daily.   Yes [provider]  melatonin 3 MG TABS tablet Take 3 mg by mouth at bedtime.   Yes  [provider]  mirtazapine (REMERON) 7.5 MG tablet Take 1 tablet (7.5 mg total) by mouth at bedtime. 01/23/22  Yes Leroy Sea, MD  omeprazole (PRILOSEC) 20 MG capsule Take 1 capsule (20 mg total) by mouth daily. Patient taking differently: Take 20 mg by mouth 2 (two) times daily before a meal. 06/19/19  Yes Palumbo, April, MD  risperiDONE (RISPERDAL) 1 MG tablet Take 1 tablet (1 mg total) by mouth at bedtime. 10/27/21  Yes Johnson, Clanford L, MD  spironolactone (ALDACTONE) 25 MG tablet Take 0.5 tablets (12.5 mg total) by mouth daily. 05/24/22  Yes Laurey Morale, MD   Review of Systems  Constitutional:  Positive for fatigue. Negative for appetite change.  HENT:  Negative for congestion, postnasal drip and  sore throat.   Eyes: Negative.   Respiratory:  Positive for cough (improving). Negative for chest tightness and shortness of breath.   Cardiovascular:  Negative for chest pain, palpitations and leg swelling.  Gastrointestinal:  Negative for abdominal distention and abdominal pain.  Endocrine: Negative.   Genitourinary: Negative.   Musculoskeletal:  Negative for back pain and neck pain.  Skin: Negative.   Allergic/Immunologic: Negative.   Neurological:  Negative for dizziness and light-headedness.  Hematological:  Negative for adenopathy. Bruises/bleeds easily.  Psychiatric/Behavioral:  Negative for dysphoric mood and sleep disturbance (sleeping on 2 pillow; oxygen while asleep). The patient is not nervous/anxious.    Vitals:   08/08/22 1107  BP: 109/71  Pulse: 86  SpO2: 97%  Weight: 182 lb (82.6 kg)   Wt Readings from Last 3 Encounters:  08/08/22 182 lb (82.6 kg)  07/19/22 179 lb 8 oz (81.4 kg)  06/14/22 174 lb 6 oz (79.1 kg)   Lab Results  Component Value Date   CREATININE 1.29 (H) 07/19/2022   CREATININE 1.09 (H) 06/15/2022   CREATININE 1.14 (H) 05/24/2022   Physical Exam Vitals and nursing note reviewed. Exam conducted with a chaperone present  (caregiver).  Constitutional:      Appearance: Normal appearance.  HENT:     Head: Normocephalic and atraumatic.  Cardiovascular:     Rate and Rhythm: Normal rate and regular rhythm.  Pulmonary:     Effort: Pulmonary effort is normal.     Breath sounds: No wheezing, rhonchi or rales.  Abdominal:     General: There is no distension.     Palpations: Abdomen is soft.     Tenderness: There is no abdominal tenderness.  Musculoskeletal:        General: No tenderness.     Cervical back: Normal range of motion and neck supple.     Right lower leg: No edema.     Left lower leg: No edema.  Skin:    General: Skin is warm and dry.  Neurological:     General: No focal deficit present.     Mental Status: [name] is alert and oriented to person, place, and time.  Psychiatric:        Mood and Affect: Mood normal.        Behavior: Behavior normal.   Assessment & Plan:  1: Chronic heart failure with reduced ejection fraction- - NYHA class II - euvolemic  - being weighed daily; reminded to call for an overnight weight gain of > 2 pounds or a weekly weight gain of > 5 pounds - weight up 3 pounds from last visit here 3 weeks ago - suspect NICM as cardiac PET showed no ischemia and cardiac MRI did not show evidence for MI.   - echo (4/23): EF 25%; echo (10/23): EF 30-35%, normal RV, mild-moderate MR - cardiac PET (8/23): EF 28%, no ischemia, coronary calcifications noted.  - cardiac MRI (11/23): LVEF 23%, moderate LV dilation, septal-lateral dyssynchrony, RVEF 46%, nonspecific RV insertion site LGE (consistent with NICM). - adds the "small" packet of salt to some foods and likes to eat oysters with cocktail sauce; reviewed not eating the sauce and to use pepper on her food - drinking tea and water during the day - stop losartan and begin entresto 24/26mg  BID; emphasized to caregiver and order written to stop the losartan - continue spironolactone 12.5mg  daily - continue farxiga 10mg  daily -  continue furosemide 20mg  daily - BMP next visit - saw [facility] provider Shirlee Latch) 04/24 &  entresto / midodrine were both stopped; resuming entresto per above - saw cardiology Brion Aliment) 12/23 - BNP 07/19/22 was 263.1 - PharmD reconciled meds w/ patient & caregiver  2: HTN- - BP 109/71 - currently seeing PCP at facility - [facility] 07/19/22 showed sodium 133, potassium 4.7, creatinine 1.29 & GFR 42  3: COPD- - combivent PRN  4: Schizophrenia- - risperidone 1mg  QHS - amantadine 100mg  BID - mirtazapine 7.5mg  QHS  Return in 3 weeks, sooner if needed.

## 2022-08-08 NOTE — Patient Instructions (Signed)
Stop losartan and begin entresto 24/26mg  BID.

## 2022-08-08 NOTE — Progress Notes (Signed)
[facility] HEART FAILURE CLINIC - Pharmacist Education Note  Assessment [name] is a 82 y.o. female with HFrEF (EF <40%) presenting to the Heart Failure Clinic for follow up. Patient has no complaints today. She reports that she weighs herself daily and that her usual weight is around 179 lbs. Today she is 182 lbs, and she attributes this to her dinner last night (salty fried fish). She reports no symptoms of fluid overload.  Recent ED Visit (past 6 months):  Date: 04/23/2022, CC: fall Date: 03/12/2022, CC: orthopnea  Guideline-Directed Medical Therapy/Evidence Based Medicine ACE/ARB/ARNI:  none Beta Blocker:  none Aldosterone Antagonist: Spironolactone 25 mg daily Diuretic: Furosemide 20 mg daily SGLT2i: Dapagliflozin 10 mg daily  Adherence Assessment Do you ever forget to take your medication? [] Yes [x] No  Do you ever skip doses due to side effects? [] Yes [x] No  Do you have trouble affording your medicines? [] Yes [x] No  Are you ever unable to pick up your medication due to transportation difficulties? [] Yes [x] No  Do you ever stop taking your medications because you don't believe they are helping? [] Yes [x] No  Do you check your weight daily? [x] Yes [] No  Adherence strategy: Facility manages and administers medications Barriers to obtaining medications: None reported  Diagnostics ECHO: Date 01/30/2022, EF 30-35%, GHK, G1DD  Vitals    08/08/2022   11:07 AM 07/19/2022   11:54 AM 06/14/2022   11:26 AM  Vitals with BMI  Weight 182 lbs 179 lbs 8 oz 174 lbs 6 oz  Systolic 109 93 110  Diastolic 71 60 53  Pulse 86 83 89     Recent Labs    Latest Ref Rng & Units 07/19/2022   12:50 PM 06/15/2022    2:10 PM 05/24/2022   11:55 AM  BMP  Glucose 70 - 99 mg/dL 161  96  94   BUN 8 - 23 mg/dL 32  26  23   Creatinine 0.44 - 1.00 mg/dL 0.96  0.45  4.09   BUN/Creat Ratio 12 - 28  24    Sodium 135 - 145 mmol/L 133  138  131   Potassium 3.5 - 5.1 mmol/L 4.7  4.1  4.1   Chloride 98 -  111 mmol/L 100  100  97   CO2 22 - 32 mmol/L 22  23  24    Calcium 8.9 - 10.3 mg/dL 8.6  8.9  8.7     Past Medical History Past Medical History:  Diagnosis Date   Anemia 1990   hx of   Anxiety    Arthritis 2004   Asthma 1995   uses inhaler   CHF (congestive heart failure) (HCC)    Chronic HFrEF (heart failure with reduced ejection fraction) (HCC)    a. 07/2021 Echo: EF 25%; b. 01/2022 Echo: EF 30-35%, glob HK, GrI DD, nl RV fxn, mild-mod MR; c. 02/2022 cMRI: EF 23%.   CKD (chronic kidney disease), stage III (HCC) 2010   a. Solitary kidney - s/p L nephrectomy in 2010.   Colon polyp    Complication of anesthesia 1966   first surgery she had- had a hard time waking up from surgery   COPD (chronic obstructive pulmonary disease) (HCC)    Delusional disorder (HCC)    Six delusional disorder   Diverticulosis 2003   GERD (gastroesophageal reflux disease)    on meds   Hypertension    NICM (nonischemic cardiomyopathy) (HCC)    a. 07/2021 Echo: EF 25%; b. 11/2021 Lexi PET/CT: EF 28% (35% w/  stress). Cor Ca2+. No ischemia; c.01/2022 Echo: EF 30-35%, glob HK, GrI DD, nl RV fxn, mild-mod MR; d. 02/2022 cMRI: EF 23%, diff HK, septal-lateral dyssynchrony w/ LBBB. Nonspecific RV insertion site LGE, borderline elev extracellular volume percentage sugg some replacement fibrosis-->NICM.   Schizophrenia (HCC)    Severe left ventricular systolic dysfunction (LVSD)    EF 23% by cardiac MRI    Plan CHF Continue losartan, spironolactone, dapagliflozin, and furosemide  Continue daily weights   Time spent: 15 minutes  Celene Squibb, PharmD PGY1 Pharmacy Resident 08/08/2022 11:49 AM

## 2022-08-09 ENCOUNTER — Encounter: Payer: 59 | Admitting: Family

## 2022-08-29 ENCOUNTER — Other Ambulatory Visit
Admission: RE | Admit: 2022-08-29 | Discharge: 2022-08-29 | Disposition: A | Discharge status: Home / Self Care | Payer: 59 | Source: Ambulatory Visit | Attending: Family | Admitting: Family

## 2022-08-29 ENCOUNTER — Ambulatory Visit (HOSPITAL_BASED_OUTPATIENT_CLINIC_OR_DEPARTMENT_OTHER): Payer: 59 | Admitting: Family

## 2022-08-29 VITALS — BP 84/58 | HR 96 | Wt 188.0 lb

## 2022-08-29 DIAGNOSIS — I10 Essential (primary) hypertension: Secondary | ICD-10-CM

## 2022-08-29 DIAGNOSIS — J449 Chronic obstructive pulmonary disease, unspecified: Secondary | ICD-10-CM

## 2022-08-29 DIAGNOSIS — I5022 Chronic systolic (congestive) heart failure: Secondary | ICD-10-CM | POA: Diagnosis present

## 2022-08-29 DIAGNOSIS — F200 Paranoid schizophrenia: Secondary | ICD-10-CM | POA: Diagnosis not present

## 2022-08-29 LAB — BASIC METABOLIC PANEL
Anion gap: 10 (ref 5–15)
BUN: 26 mg/dL — ABNORMAL HIGH (ref 8–23)
CO2: 22 mmol/L (ref 22–32)
Calcium: 8.8 mg/dL — ABNORMAL LOW (ref 8.9–10.3)
Chloride: 100 mmol/L (ref 98–111)
Creatinine, Ser: 1.2 mg/dL — ABNORMAL HIGH (ref 0.44–1.00)
GFR, Estimated: 46 mL/min — ABNORMAL LOW (ref >60)
Glucose, Bld: 108 mg/dL — ABNORMAL HIGH (ref 70–99)
Potassium: 4.5 mmol/L (ref 3.5–5.1)
Sodium: 132 mmol/L — ABNORMAL LOW (ref 135–145)

## 2022-08-29 NOTE — Progress Notes (Addendum)
[facility] HEART FAILURE CLINIC - Pharmacist Note  [name] is a 82 y.o. female with HFrEF (EF <40%) presenting to the Heart Failure Clinic for follow up. She is here today with an aid from her facility. They report no issues since switching from losartan to [facility] last visit. In clinic today, she is a bit hypotensive but reports no symptoms of hypotension.   Recent ED Visit (past 6 months):  Date: 04/23/2022, CC: fall Date: 03/12/2022, CC: orthopnea  Guideline-Directed Medical Therapy/Evidence Based Medicine ACE/ARB/ARNI:  Entresto 24/26 mg twice daily Beta Blocker:  none Aldosterone Antagonist: Spironolactone 25 mg daily Diuretic: Furosemide 20 mg daily SGLT2i: Dapagliflozin 10 mg daily  Adherence Assessment Do you ever forget to take your medication? [] Yes [x] No  Do you ever skip doses due to side effects? [] Yes [x] No  Do you have trouble affording your medicines? [] Yes [x] No  Are you ever unable to pick up your medication due to transportation difficulties? [] Yes [x] No  Do you ever stop taking your medications because you don't believe they are helping? [] Yes [x] No  Do you check your weight daily? [x] Yes [] No  Adherence strategy: Facility manages and administers medications Barriers to obtaining medications: None reported  Diagnostics ECHO: Date 01/30/2022, EF 30-35%, GHK, G1DD  Vitals    08/08/2022   11:07 AM 07/19/2022   11:54 AM 06/14/2022   11:26 AM  Vitals with BMI  Weight 182 lbs 179 lbs 8 oz 174 lbs 6 oz  Systolic 109 93 110  Diastolic 71 60 53  Pulse 86 83 89     Recent Labs    Latest Ref Rng & Units 07/19/2022   12:50 PM 06/15/2022    2:10 PM 05/24/2022   11:55 AM  BMP  Glucose 70 - 99 mg/dL 540  96  94   BUN 8 - 23 mg/dL 32  26  23   Creatinine 0.44 - 1.00 mg/dL 9.81  1.91  4.78   BUN/Creat Ratio 12 - 28  24    Sodium 135 - 145 mmol/L 133  138  131   Potassium 3.5 - 5.1 mmol/L 4.7  4.1  4.1   Chloride 98 - 111 mmol/L 100  100  97   CO2 22 - 32  mmol/L 22  23  24    Calcium 8.9 - 10.3 mg/dL 8.6  8.9  8.7     Past Medical History Past Medical History:  Diagnosis Date   Anemia 1990   hx of   Anxiety    Arthritis 2004   Asthma 1995   uses inhaler   CHF (congestive heart failure) (HCC)    Chronic HFrEF (heart failure with reduced ejection fraction) (HCC)    a. 07/2021 Echo: EF 25%; b. 01/2022 Echo: EF 30-35%, glob HK, GrI DD, nl RV fxn, mild-mod MR; c. 02/2022 cMRI: EF 23%.   CKD (chronic kidney disease), stage III (HCC) 2010   a. Solitary kidney - s/p L nephrectomy in 2010.   Colon polyp    Complication of anesthesia 1966   first surgery she had- had a hard time waking up from surgery   COPD (chronic obstructive pulmonary disease) (HCC)    Delusional disorder (HCC)    Six delusional disorder   Diverticulosis 2003   GERD (gastroesophageal reflux disease)    on meds   Hypertension    NICM (nonischemic cardiomyopathy) (HCC)    a. 07/2021 Echo: EF 25%; b. 11/2021 Lexi PET/CT: EF 28% (35% w/ stress). Cor Ca2+. No ischemia; c.01/2022  Echo: EF 30-35%, glob HK, GrI DD, nl RV fxn, mild-mod MR; d. 02/2022 cMRI: EF 23%, diff HK, septal-lateral dyssynchrony w/ LBBB. Nonspecific RV insertion site LGE, borderline elev extracellular volume percentage sugg some replacement fibrosis-->NICM.   Schizophrenia (HCC)    Severe left ventricular systolic dysfunction (LVSD)    EF 23% by cardiac MRI    Plan Continue regimen as directed by NP Check labs today Annual echo due 01/2023  Time spent: 10 minutes  Celene Squibb, PharmD PGY1 Pharmacy Resident 08/29/2022 11:08 AM

## 2022-08-29 NOTE — Patient Instructions (Signed)
Check blood pressure daily and call if the blood pressure gets <100/60.

## 2022-08-29 NOTE — Progress Notes (Unsigned)
PCP: PCP: Christene Lye, FNP (at facility) Primary Cardiologist: Primary cardiologist: Nicolasa Ducking, NP (last seen 12/23) HF cardiologist: Marca Ancona, MD (last seen 04/24)  HPI:  [name] is a 82 y/o female with a history of asthma, HTN, CKD, anemia, anxiety, COPD, diverticulosis, GERD, schizophrenia and chronic heart failure.   Echo 01/30/22: EF of 30-35% along with moderate LAE, mild/ moderate MR and moderate left pleural effusion.   Cardiac MRI 03/06/22: Several cystic liver lesions noted, largest 3.7 cm in diameter.  Moderate bilateral pleural effusions.  Trivial pericardial effusion. Moderately dilated left ventricle with normal wall thickness. Septal-lateral dyssynchrony consistent with LBBB, global hypokinesis with EF 23%. Normal right ventricular size with mildly reduced systolic function, EF 46%. Mild left atrial enlargement, normal right atrial size. Visually, there is at least mild mitral regurgitation. By flow analysis, there is no significant mitral regurgitation. Trileaflet aortic valve, no signficant stenosis, trivial aortic insufficiency.  Cardiac PET 08/23: no ischemia  Admitted 04/23/22 due to COPD exacerbation and fall. Home O2 was set up. Admitted 03/09/22 due to SOB due to HF exacerbation.    She presents today for a HF f/u visit with a chief complaint of minimal SOB with moderate exertion. Chronic in nature. Has associated fatigue especially at the end of the day, reflux, dry cough,   Since last visit, she's been taking entresto 24/26mg  BID.    ROS: All systems negative except as listed in HPI, PMH and Problem List.  SH:  Social History   Socioeconomic History   Marital status: Legally Separated    Spouse name: Not on file   Number of children: 10   Years of education: Not on file   Highest education level: Not on file  Occupational History   Occupation: retired  Tobacco Use   Smoking status: Never   Smokeless tobacco: Never  Vaping Use    Vaping Use: Never used  Substance and Sexual Activity   Alcohol use: Not Currently    Comment: occassionally   Drug use: No   Sexual activity: Not Currently  Other Topics Concern   Not on file  Social History Narrative   Not on file   Social Determinants of Health   Financial Resource Strain: Not on file  Food Insecurity: No Food Insecurity (04/23/2022)   Hunger Vital Sign    Worried About Running Out of Food in the Last Year: Never true    Ran Out of Food in the Last Year: Never true  Transportation Needs: No Transportation Needs (04/23/2022)   PRAPARE - Administrator, Civil Service (Medical): No    Lack of Transportation (Non-Medical): No  Physical Activity: Not on file  Stress: Not on file  Social Connections: Not on file  Intimate Partner Violence: Not At Risk (04/23/2022)   Humiliation, Afraid, Rape, and Kick questionnaire    Fear of Current or Ex-Partner: No    Emotionally Abused: No    Physically Abused: No    Sexually Abused: No    FH:  Family History  Problem Relation Age of Onset   Hypertension Mother    Heart failure Mother    Hyperlipidemia Mother    Stroke Mother    Hypertension Father    Heart attack Father    COPD Father    Diabetes Paternal Aunt    Diabetes Paternal Grandmother    Colon polyps Other        niece   Stomach cancer Neg Hx    Rectal cancer Neg  Hx    Esophageal cancer Neg Hx    Colon cancer Neg Hx     Past Medical History:  Diagnosis Date   Anemia 1990   hx of   Anxiety    Arthritis 2004   Asthma 1995   uses inhaler   CHF (congestive heart failure) (HCC)    Chronic HFrEF (heart failure with reduced ejection fraction) (HCC)    a. 07/2021 Echo: EF 25%; b. 01/2022 Echo: EF 30-35%, glob HK, GrI DD, nl RV fxn, mild-mod MR; c. 02/2022 cMRI: EF 23%.   CKD (chronic kidney disease), stage III (HCC) 2010   a. Solitary kidney - s/p L nephrectomy in 2010.   Colon polyp    Complication of anesthesia 1966   first surgery she  had- had a hard time waking up from surgery   COPD (chronic obstructive pulmonary disease) (HCC)    Delusional disorder (HCC)    Six delusional disorder   Diverticulosis 2003   GERD (gastroesophageal reflux disease)    on meds   Hypertension    NICM (nonischemic cardiomyopathy) (HCC)    a. 07/2021 Echo: EF 25%; b. 11/2021 Lexi PET/CT: EF 28% (35% w/ stress). Cor Ca2+. No ischemia; c.01/2022 Echo: EF 30-35%, glob HK, GrI DD, nl RV fxn, mild-mod MR; d. 02/2022 cMRI: EF 23%, diff HK, septal-lateral dyssynchrony w/ LBBB. Nonspecific RV insertion site LGE, borderline elev extracellular volume percentage sugg some replacement fibrosis-->NICM.   Schizophrenia (HCC)    Severe left ventricular systolic dysfunction (LVSD)    EF 23% by cardiac MRI    Current Outpatient Medications  Medication Sig Dispense Refill   amantadine (SYMMETREL) 100 MG capsule Take 100 mg by mouth 2 (two) times daily.     BREO ELLIPTA 200-25 MCG/INH AEPB Inhale 1 puff into the lungs daily.     Calcium Carb-Cholecalciferol (CALCIUM 600+D3 PO) Take 1 tablet by mouth daily.     cetirizine (ZYRTEC) 10 MG tablet Take 10 mg by mouth daily.     dapagliflozin propanediol (FARXIGA) 10 MG TABS tablet Take 1 tablet (10 mg total) by mouth daily before breakfast. 90 tablet 3   fluticasone (FLONASE) 50 MCG/ACT nasal spray Place 2 sprays into both nostrils daily.     furosemide (LASIX) 20 MG tablet Take 1 tablet (20 mg total) by mouth daily. 30 tablet 1   ibuprofen (ADVIL) 400 MG tablet Take 400 mg by mouth every 8 (eight) hours as needed for cramping, moderate pain or fever.     losartan (COZAAR) 25 MG tablet Take 25 mg by mouth daily.     melatonin 3 MG TABS tablet Take 3 mg by mouth at bedtime.     mirtazapine (REMERON) 7.5 MG tablet Take 1 tablet (7.5 mg total) by mouth at bedtime. 30 tablet 0   omeprazole (PRILOSEC) 20 MG capsule Take 1 capsule (20 mg total) by mouth daily. (Patient taking differently: Take 20 mg by mouth 2 (two) times  daily before a meal.) 30 capsule 0   risperiDONE (RISPERDAL) 1 MG tablet Take 1 tablet (1 mg total) by mouth at bedtime. 30 tablet 1   sacubitril-valsartan (ENTRESTO) 24-26 MG Take 1 tablet by mouth 2 (two) times daily. (Patient not taking: Reported on 08/08/2022) 60 tablet 3   spironolactone (ALDACTONE) 25 MG tablet Take 0.5 tablets (12.5 mg total) by mouth daily. 45 tablet 3   No current facility-administered medications for this visit.   Vitals:   08/29/22 1106  BP: (!) 83/57  Pulse: 96  SpO2: 95%  Weight: 188 lb (85.3 kg)   Wt Readings from Last 3 Encounters:  08/29/22 188 lb (85.3 kg)  08/08/22 182 lb (82.6 kg)  07/19/22 179 lb 8 oz (81.4 kg)   Lab Results  Component Value Date   CREATININE 1.29 (H) 07/19/2022   CREATININE 1.09 (H) 06/15/2022   CREATININE 1.14 (H) 05/24/2022   PHYSICAL EXAM:  General:  Well appearing. No resp difficulty HEENT: normal Neck: supple. JVP flat. Carotids 2+ bilaterally; no bruits. No lymphadenopathy or thryomegaly appreciated. Cor: PMI normal. Regular rate & rhythm. No rubs, gallops or murmurs. Lungs: clear Abdomen: soft, nontender, nondistended. No hepatosplenomegaly. No bruits or masses. Good bowel sounds. Extremities: no cyanosis, clubbing, rash, edema Neuro: alert & orientedx3, cranial nerves grossly intact. Moves all 4 extremities w/o difficulty. Affect pleasant.   ECG:   ASSESSMENT & PLAN:  1: NICM with reduced ejection fraction- - suspect NICM as cardiac PET showed no ischemia and cardiac MRI did not show evidence for MI.  - NYHA class II - euvolemic  - being weighed daily; reminded to call for an overnight weight gain of > 2 pounds or a weekly weight gain of > 5 pounds - weight up 6 pounds from last visit here 3 weeks ago - echo (4/23): EF 25%; echo (10/23): EF 30-35%, normal RV, mild-moderate MR - cardiac PET (8/23): EF 28%, no ischemia, coronary calcifications noted.  - cardiac MRI (11/23): LVEF 23%, moderate LV dilation,  septal-lateral dyssynchrony, RVEF 46%, nonspecific RV insertion site LGE (consistent with NICM). - adds the "small" packet of salt to some foods and likes to eat oysters with cocktail sauce; reviewed not eating the sauce and to use pepper on her food - drinking tea and water during the day - continue entresto 24/26mg  BID - continue spironolactone 12.5mg  daily - continue farxiga 10mg  daily - continue furosemide 20mg  daily - BMP today - saw [facility] provider Shirlee Latch) 04/24  - saw cardiology Brion Aliment) 12/23 - BNP 07/19/22 was 263.1 - PharmD reconciled meds w/ patient & caregiver  2: HTN- - BP 83/57 - currently seeing PCP at facility - [facility] 07/19/22 showed sodium 133, potassium 4.7, creatinine 1.29 & GFR 42  3: COPD- - combivent PRN  4: Schizophrenia- - risperidone 1mg  QHS - amantadine 100mg  BID - mirtazapine 7.5mg  QHS

## 2022-08-30 ENCOUNTER — Encounter: Payer: Self-pay | Admitting: Family

## 2022-09-03 ENCOUNTER — Other Ambulatory Visit: Payer: Self-pay | Admitting: Cardiology

## 2022-09-03 DIAGNOSIS — I340 Nonrheumatic mitral (valve) insufficiency: Secondary | ICD-10-CM

## 2022-09-03 DIAGNOSIS — I519 Heart disease, unspecified: Secondary | ICD-10-CM

## 2022-10-02 ENCOUNTER — Ambulatory Visit: Payer: 59 | Attending: Family | Admitting: Family

## 2022-10-02 ENCOUNTER — Encounter: Payer: Self-pay | Admitting: Family

## 2022-10-02 VITALS — BP 94/60 | HR 80 | Ht 62.5 in | Wt 183.0 lb

## 2022-10-02 DIAGNOSIS — I428 Other cardiomyopathies: Secondary | ICD-10-CM | POA: Insufficient documentation

## 2022-10-02 DIAGNOSIS — F209 Schizophrenia, unspecified: Secondary | ICD-10-CM | POA: Insufficient documentation

## 2022-10-02 DIAGNOSIS — I447 Left bundle-branch block, unspecified: Secondary | ICD-10-CM | POA: Diagnosis not present

## 2022-10-02 DIAGNOSIS — Z8249 Family history of ischemic heart disease and other diseases of the circulatory system: Secondary | ICD-10-CM | POA: Diagnosis not present

## 2022-10-02 DIAGNOSIS — Z79899 Other long term (current) drug therapy: Secondary | ICD-10-CM | POA: Diagnosis not present

## 2022-10-02 DIAGNOSIS — J449 Chronic obstructive pulmonary disease, unspecified: Secondary | ICD-10-CM | POA: Diagnosis not present

## 2022-10-02 DIAGNOSIS — F200 Paranoid schizophrenia: Secondary | ICD-10-CM | POA: Diagnosis not present

## 2022-10-02 DIAGNOSIS — I10 Essential (primary) hypertension: Secondary | ICD-10-CM

## 2022-10-02 DIAGNOSIS — I5022 Chronic systolic (congestive) heart failure: Secondary | ICD-10-CM | POA: Insufficient documentation

## 2022-10-02 DIAGNOSIS — N183 Chronic kidney disease, stage 3 unspecified: Secondary | ICD-10-CM | POA: Diagnosis not present

## 2022-10-02 DIAGNOSIS — I130 Hypertensive heart and chronic kidney disease with heart failure and stage 1 through stage 4 chronic kidney disease, or unspecified chronic kidney disease: Secondary | ICD-10-CM | POA: Diagnosis present

## 2022-10-02 NOTE — Patient Instructions (Signed)
It was good to see you today. Call us in the future if you have any questions or need to make another appointment.

## 2022-10-02 NOTE — Progress Notes (Signed)
[facility] HEART FAILURE CLIN[facility] - Pharmacist Note  [name] is a 82 y.o. female with HFrEF (EF <40%) presenting to the Heart Failure Clinic for follow up. She is here today with an aid from her facility. They report no issues on current regimen. She reports no signs or symptoms of volume overload today, but reports an episode of SOB a few days ago that has since resolved.   Recent ED Visit (past 6 months):  Date: 04/23/2022, CC: fall Date: 03/12/2022, CC: orthopnea  Guideline-Directed Medical Therapy/Evidence Based Medicine ACE/ARB/ARNI: Sacubitril/valsartan 24/26 mg twice daily Beta Blocker:  none Aldosterone Antagonist: Spironolactone 25 mg daily Diuretic: Furosemide 20 mg daily SGLT2i: Dapagliflozin 10 mg daily  Adherence Assessment Do you ever forget to take your medication? [] Yes [x] No  Do you ever skip doses due to side effects? [] Yes [x] No  Do you have trouble affording your medicines? [] Yes [x] No  Are you ever unable to pick up your medication due to transportation difficulties? [] Yes [x] No  Do you ever stop taking your medications because you don't believe they are helping? [] Yes [x] No  Do you check your weight daily? [x] Yes [] No  Adherence strategy: Facility manages and administers medications Barriers to obtaining medications: None reported  Diagnostics ECHO: Date 01/30/2022, EF 30-35%, GHK, G1DD  Vitals    08/29/2022   11:13 AM 08/29/2022   11:06 AM 08/08/2022   11:07 AM  Vitals with BMI  Weight  188 lbs 182 lbs  Systolic 84 83 109  Diastolic 58 57 71  Pulse  96 86     Recent Labs    Latest Ref Rng & Units 08/29/2022   12:20 PM 07/19/2022   12:50 PM 06/15/2022    2:10 PM  BMP  Glucose 70 - 99 mg/dL 161  096  96   BUN 8 - 23 mg/dL 26  32  26   Creatinine 0.44 - 1.00 mg/dL 0.45  4.09  8.11   BUN/Creat Ratio 12 - 28   24   Sodium 135 - 145 mmol/L 132  133  138   Potassium 3.5 - 5.1 mmol/L 4.5  4.7  4.1   Chloride 98 - 111 mmol/L 100  100  100   CO2 22 -  32 mmol/L 22  22  23    Calcium 8.9 - 10.3 mg/dL 8.8  8.6  8.9     Past Medical History Past Medical History:  Diagnosis Date   Anemia 1990   hx of   Anxiety    Arthritis 2004   Asthma 1995   uses inhaler   CHF (congestive heart failure) (HCC)    Chronic HFrEF (heart failure with reduced ejection fraction) (HCC)    a. 07/2021 Echo: EF 25%; b. 01/2022 Echo: EF 30-35%, glob HK, GrI DD, nl RV fxn, mild-mod MR; c. 02/2022 cMRI: EF 23%.   CKD (chronic kidney disease), stage III (HCC) 2010   a. Solitary kidney - s/p L nephrectomy in 2010.   Colon polyp    Complication of anesthesia 1966   first surgery she had- had a hard time waking up from surgery   COPD (chronic obstructive pulmonary disease) (HCC)    Delusional disorder (HCC)    Six delusional disorder   Diverticulosis 2003   GERD (gastroesophageal reflux disease)    on meds   Hypertension    NICM (nonischemic cardiomyopathy) (HCC)    a. 07/2021 Echo: EF 25%; b. 11/2021 Lexi PET/CT: EF 28% (35% w/ stress). Cor Ca2+. No ischemia; c.01/2022 Echo:  EF 30-35%, glob HK, GrI DD, nl RV fxn, mild-mod MR; d. 02/2022 cMRI: EF 23%, diff HK, septal-lateral dyssynchrony w/ LBBB. Nonspecific RV insertion site LGE, borderline elev extracellular volume percentage sugg some replacement fibrosis-->NICM.   Schizophrenia (HCC)    Severe left ventricular systolic dysfunction (LVSD)    EF 23% by cardiac MRI    Plan Continue regimen as directed by NP Annual echo due 01/2023  Time spent: 10 minutes  Celene Squibb, PharmD Clinical Pharmacist 10/02/2022 12:54 PM

## 2022-10-02 NOTE — Progress Notes (Signed)
PCP: Christene Lye, FNP (at facility) Primary Cardiologist: Primary cardiologist: Nicolasa Ducking, NP (last seen 12/23) HF cardiologist: Marca Ancona, MD (last seen 04/24)  HPI:  Ms [name] is a 82 y/o female with a history of asthma, HTN, CKD, anemia, anxiety, COPD, diverticulosis, GERD, schizophrenia and chronic heart failure.   Echo 01/30/22: EF of 30-35% along with moderate LAE, mild/ moderate MR and moderate left pleural effusion.   Cardiac MRI 03/06/22: Several cystic liver lesions noted, largest 3.7 cm in diameter.  Moderate bilateral pleural effusions.  Trivial pericardial effusion. Moderately dilated left ventricle with normal wall thickness. Septal-lateral dyssynchrony consistent with LBBB, global hypokinesis with EF 23%. Normal right ventricular size with mildly reduced systolic function, EF 46%. Mild left atrial enlargement, normal right atrial size. Visually, there is at least mild mitral regurgitation. By flow analysis, there is no significant mitral regurgitation. Trileaflet aortic valve, no signficant stenosis, trivial aortic insufficiency.  Cardiac PET 08/23: no ischemia  Admitted 04/23/22 due to COPD exacerbation and fall. Home O2 was set up. Admitted 03/09/22 due to SOB due to HF exacerbation.    She presents today for a HF f/u visit with a chief complaint of minimal SOB with moderate exertion. Intermittent dizziness with bending over and getting up too fast and fatigue in the evenings along with this. Waking up twice a night due to urination but then goes back to sleep easily. Denies cough, chest pain, palpitations or pedal edema.   Weighing daily although she says that she doesn't get weighed until she goes to the dining hall because that is where the scales are located. Says that she doesn't have a way to get weighed prior to getting dressed in the mornings.   ROS: All systems negative except as listed in HPI, PMH and Problem List.  SH:  Social History    Socioeconomic History   Marital status: Legally Separated    Spouse name: Not on file   Number of children: 10   Years of education: Not on file   Highest education level: Not on file  Occupational History   Occupation: retired  Tobacco Use   Smoking status: Never   Smokeless tobacco: Never  Vaping Use   Vaping Use: Never used  Substance and Sexual Activity   Alcohol use: Not Currently    Comment: occassionally   Drug use: No   Sexual activity: Not Currently  Other Topics Concern   Not on file  Social History Narrative   Not on file   Social Determinants of Health   Financial Resource Strain: Not on file  Food Insecurity: No Food Insecurity (04/23/2022)   Hunger Vital Sign    Worried About Running Out of Food in the Last Year: Never true    Ran Out of Food in the Last Year: Never true  Transportation Needs: No Transportation Needs (04/23/2022)   PRAPARE - Administrator, Civil Service (Medical): No    Lack of Transportation (Non-Medical): No  Physical Activity: Not on file  Stress: Not on file  Social Connections: Not on file  Intimate Partner Violence: Not At Risk (04/23/2022)   Humiliation, Afraid, Rape, and Kick questionnaire    Fear of Current or Ex-Partner: No    Emotionally Abused: No    Physically Abused: No    Sexually Abused: No    FH:  Family History  Problem Relation Age of Onset   Hypertension Mother    Heart failure Mother    Hyperlipidemia Mother  Stroke Mother    Hypertension Father    Heart attack Father    COPD Father    Diabetes Paternal Aunt    Diabetes Paternal Grandmother    Colon polyps Other        niece   Stomach cancer Neg Hx    Rectal cancer Neg Hx    Esophageal cancer Neg Hx    Colon cancer Neg Hx     Past Medical History:  Diagnosis Date   Anemia 1990   hx of   Anxiety    Arthritis 2004   Asthma 1995   uses inhaler   CHF (congestive heart failure) (HCC)    Chronic HFrEF (heart failure with reduced  ejection fraction) (HCC)    a. 07/2021 Echo: EF 25%; b. 01/2022 Echo: EF 30-35%, glob HK, GrI DD, nl RV fxn, mild-mod MR; c. 02/2022 cMRI: EF 23%.   CKD (chronic kidney disease), stage III (HCC) 2010   a. Solitary kidney - s/p L nephrectomy in 2010.   Colon polyp    Complication of anesthesia 1966   first surgery she had- had a hard time waking up from surgery   COPD (chronic obstructive pulmonary disease) (HCC)    Delusional disorder (HCC)    Six delusional disorder   Diverticulosis 2003   GERD (gastroesophageal reflux disease)    on meds   Hypertension    NICM (nonischemic cardiomyopathy) (HCC)    a. 07/2021 Echo: EF 25%; b. 11/2021 Lexi PET/CT: EF 28% (35% w/ stress). Cor Ca2+. No ischemia; c.01/2022 Echo: EF 30-35%, glob HK, GrI DD, nl RV fxn, mild-mod MR; d. 02/2022 cMRI: EF 23%, diff HK, septal-lateral dyssynchrony w/ LBBB. Nonspecific RV insertion site LGE, borderline elev extracellular volume percentage sugg some replacement fibrosis-->NICM.   Schizophrenia (HCC)    Severe left ventricular systolic dysfunction (LVSD)    EF 23% by cardiac MRI    Current Outpatient Medications  Medication Sig Dispense Refill   amantadine (SYMMETREL) 100 MG capsule Take 100 mg by mouth 2 (two) times daily.     BREO ELLIPTA 200-25 MCG/INH AEPB Inhale 1 puff into the lungs daily.     Calcium Carb-Cholecalciferol (CALCIUM 600+D3 PO) Take 1 tablet by mouth daily.     cetirizine (ZYRTEC) 10 MG tablet Take 10 mg by mouth daily.     dapagliflozin propanediol (FARXIGA) 10 MG TABS tablet Take 1 tablet (10 mg total) by mouth daily before breakfast. 90 tablet 3   fluticasone (FLONASE) 50 MCG/ACT nasal spray Place 2 sprays into both nostrils daily.     furosemide (LASIX) 20 MG tablet Take 1 tablet (20 mg total) by mouth daily. 30 tablet 1   ibuprofen (ADVIL) 400 MG tablet Take 400 mg by mouth every 8 (eight) hours as needed for cramping, moderate pain or fever.     losartan (COZAAR) 25 MG tablet Take 25 mg by  mouth daily. (Patient not taking: Reported on 08/29/2022)     melatonin 3 MG TABS tablet Take 3 mg by mouth at bedtime.     mirtazapine (REMERON) 7.5 MG tablet Take 1 tablet (7.5 mg total) by mouth at bedtime. 30 tablet 0   omeprazole (PRILOSEC) 20 MG capsule Take 1 capsule (20 mg total) by mouth daily. (Patient taking differently: Take 20 mg by mouth 2 (two) times daily before a meal.) 30 capsule 0   risperiDONE (RISPERDAL) 1 MG tablet Take 1 tablet (1 mg total) by mouth at bedtime. 30 tablet 1   sacubitril-valsartan (ENTRESTO) 24-26  MG Take 1 tablet by mouth 2 (two) times daily. 60 tablet 3   spironolactone (ALDACTONE) 25 MG tablet Take 0.5 tablets (12.5 mg total) by mouth daily. 45 tablet 3   No current facility-administered medications for this visit.   Vitals:   10/02/22 1301  BP: 94/60  Pulse: 80  SpO2: 96%  Weight: 183 lb (83 kg)  Height: 5' 2.5" (1.588 m)   Wt Readings from Last 3 Encounters:  10/02/22 183 lb (83 kg)  08/29/22 188 lb (85.3 kg)  08/08/22 182 lb (82.6 kg)   Lab Results  Component Value Date   CREATININE 1.20 (H) 08/29/2022   CREATININE 1.29 (H) 07/19/2022   CREATININE 1.09 (H) 06/15/2022   PHYSICAL EXAM:  General:  Well appearing. No resp difficulty HEENT: normal Neck: supple. JVP flat. No lymphadenopathy or thryomegaly appreciated. Cor: PMI normal. Regular rate & rhythm. No rubs, gallops or murmurs. Lungs: clear Abdomen: soft, nontender, nondistended. No hepatosplenomegaly. No bruits or masses.  Extremities: no cyanosis, clubbing, rash, edema Neuro: alert & oriented x3, cranial nerves grossly intact. Moves all 4 extremities w/o difficulty. Affect pleasant.  ECG: not done   ASSESSMENT & PLAN:  1: NICM with reduced ejection fraction- - suspect NICM as cardiac PET showed no ischemia and cardiac MRI did not show evidence for MI.  - NYHA class II - euvolemic  - being weighed daily although this occurs in the dining room after she is already dressed;  reminded to call for an overnight weight gain of > 2 pounds or a weekly weight gain of > 5 pounds - weight down 5 pounds from last visit here 1 month ago - echo (4/23): EF 25%; echo (10/23): EF 30-35%, normal RV, mild-moderate MR - cardiac PET (8/23): EF 28%, no ischemia, coronary calcifications noted.  - cardiac MRI (11/23): LVEF 23%, moderate LV dilation, septal-lateral dyssynchrony, RVEF 46%, nonspecific RV insertion site LGE (consistent with NICM). - adds the "small" packet of salt to some foods and likes to eat oysters with cocktail sauce; plans to get pizza for lunch after her visit today; is aware of high sodium content of pizza - drinking tea and water during the day - continue entresto 24/26mg  BID - continue spironolactone 12.5mg  daily - continue farxiga 10mg  daily - continue furosemide 20mg  daily - saw [facility] provider Shirlee Latch) 04/24  - saw cardiology Brion Aliment) 12/23 - BNP 07/19/22 was 263.1 - PharmD reconciled meds w/ patient & caregiver  2: HTN- - BP 94/60 - continue to check BP daily and call if BP gets <100/60 - currently seeing PCP at facility - [facility] 08/29/22 showed sodium 132, potassium 4.5, creatinine 1.2 & GFR 46  3: COPD- - combivent PRN  4: Schizophrenia- - risperidone 1mg  QHS - amantadine 100mg  BID - mirtazapine 7.5mg  at bedtime  Due to HF stability, will not make a return appointment at this time. Advised patient and caregiver that they could call back for questions or to make another appointment and they were comfortable with this plan.

## 2022-10-25 ENCOUNTER — Ambulatory Visit: Payer: 59 | Admitting: Podiatry

## 2022-10-25 ENCOUNTER — Encounter: Payer: Self-pay | Admitting: Podiatry

## 2022-10-25 DIAGNOSIS — M79675 Pain in left toe(s): Secondary | ICD-10-CM

## 2022-10-25 DIAGNOSIS — M79674 Pain in right toe(s): Secondary | ICD-10-CM | POA: Diagnosis not present

## 2022-10-25 DIAGNOSIS — B351 Tinea unguium: Secondary | ICD-10-CM

## 2022-10-25 NOTE — Progress Notes (Signed)
This patient presents to the office with chief complaint of long thick painful nails.  Patient says the nails are painful walking and wearing shoes.  This patient is unable to self treat.  This patient is unable to trim her nails since she is unable to reach her nails.  She presents to the office for preventative foot care services.  General Appearance  Alert, conversant and in no acute stress.  Vascular  Dorsalis pedis and posterior tibial  pulses are palpable  bilaterally.  Capillary return is within normal limits  bilaterally. Temperature is within normal limits  bilaterally.  Neurologic  Senn-Weinstein monofilament wire test within normal limits  bilaterally. Muscle power within normal limits bilaterally.  Nails Thick disfigured discolored nails with subungual debris  hallux nails bilaterally. No evidence of bacterial infection or drainage bilaterally.  Orthopedic  No limitations of motion  feet .  No crepitus or effusions noted.  No bony pathology or digital deformities noted.  Skin  normotropic skin with no porokeratosis noted bilaterally.  No signs of infections or ulcers noted.     Onychomycosis  Nails  B/L.  Pain in right toes  Pain in left toes  Debridement of nails both feet followed trimming the nails with dremel tool.    RTC 6  months.   Helane Gunther DPM

## 2022-11-18 ENCOUNTER — Other Ambulatory Visit: Payer: Self-pay | Admitting: Family

## 2023-01-21 ENCOUNTER — Telehealth: Payer: Self-pay

## 2023-01-21 NOTE — Telephone Encounter (Signed)
[facility] Address: [address], [place], [place] [address] Phone: [phone] [facility] CMA  Called to request appt for pt.  Stated that pt  weight should be <179lb and pt current weight is 185LB Per [facility] , pt is been drinking a lot more fluids and complains of back pain. She stated that she thinks may  be related to fluid increase intake.  Fluid education given with teach back.  Pt would not follow fluid intake instructions per [facility].   Appt schedule for pt to bee seen in clinic on 01/30/23 @3 :15 per [facility]F. [facility] not able to bring pt to a sooner appt.  Recommended that if pt continues to have weight increase to call clinic ,/ if SOB , CP or dizziness occurs to take pt to the ED.

## 2023-01-30 ENCOUNTER — Encounter: Payer: 59 | Admitting: Cardiology

## 2023-02-25 ENCOUNTER — Encounter: Payer: Self-pay | Admitting: Cardiology

## 2023-02-25 ENCOUNTER — Ambulatory Visit: Payer: 59 | Admitting: Cardiology

## 2023-02-25 ENCOUNTER — Other Ambulatory Visit
Admission: RE | Admit: 2023-02-25 | Discharge: 2023-02-25 | Disposition: A | Discharge status: Home / Self Care | Payer: 59 | Source: Ambulatory Visit | Attending: Cardiology | Admitting: Cardiology

## 2023-02-25 VITALS — BP 104/65 | HR 90 | Wt 190.0 lb

## 2023-02-25 DIAGNOSIS — I5022 Chronic systolic (congestive) heart failure: Secondary | ICD-10-CM

## 2023-02-25 DIAGNOSIS — I10 Essential (primary) hypertension: Secondary | ICD-10-CM

## 2023-02-25 LAB — BASIC METABOLIC PANEL
Anion gap: 8 (ref 5–15)
BUN: 27 mg/dL — ABNORMAL HIGH (ref 8–23)
CO2: 24 mmol/L (ref 22–32)
Calcium: 8.9 mg/dL (ref 8.9–10.3)
Chloride: 99 mmol/L (ref 98–111)
Creatinine, Ser: 1.3 mg/dL — ABNORMAL HIGH (ref 0.44–1.00)
GFR, Estimated: 42 mL/min — ABNORMAL LOW (ref >60)
Glucose, Bld: 110 mg/dL — ABNORMAL HIGH (ref 70–99)
Potassium: 4.5 mmol/L (ref 3.5–5.1)
Sodium: 131 mmol/L — ABNORMAL LOW (ref 135–145)

## 2023-02-25 LAB — BRAIN NATRIURETIC PEPTIDE: B Natriuretic Peptide: 380.8 pg/mL — ABNORMAL HIGH (ref 0.0–100.0)

## 2023-02-25 NOTE — Patient Instructions (Addendum)
Medication Changes: Increase lasix to 20 mg twice daily for the next 2 days then resume normal dose.   Labs: Lab work done today, your results will be available in MyChart, we will contact you for abnormal readings.   Follow Up: In 6 months with Dr. Marca Ancona

## 2023-02-25 NOTE — Progress Notes (Signed)
[facility]  PCP: Christene Lye, FNP Cardiology: Dr. Mayford Knife HF Cardiology: Dr. Shirlee Latch  82 y.o. with history of CKD stage 3, schizophrenia, and chronic systolic CHF was referred by Dr. Mayford Knife for evaluation of CHF.  She lives in an assisted living facility.  She does not smoke or drink ETOH.  Patient has been known to have a cardiomyopathy since 4/23.  Echo at that time showed EF 25%.  She had a cardiac PET done in 8/23 that showed no ischemia.  Echo in 10/23 showed that EF remained low at 30-35%, and cardiac MRI showed  LVEF 23%, moderate LV dilation, septal-lateral dyssynchrony, RVEF 46%, nonspecific RV insertion site LGE (consistent with NICM).   She most recently saw Clarisa Kindred in June 2024. At that time her only complaints were shortness of breath with exertion.   Today she presents for follow up. On exam, very mildly hypervolemic. Reports that she can ambulate with a walker, however, becomes dyspneic after short distances. No PND, orthopnea or LE edema. Currently taking lasix 20mg  daily.   Labs (02/25/23): sCr 1.3, BNP 380 (up from 263)  PMH: 1. Asthma 2. CKD stage 3 3. Schizophrenia 4. H/o left nephrectomy 5. Subdural hematoma: 10/23, due to fall.  6. Chronic systolic CHF: Nonischemic cardiomyopathy.  - Echo (4/23): EF 25% - Echo (10/23): EF 30-35%, normal RV, mild-moderate MR - Cardiac PET (8/23): EF 28%, no ischemia, coronary calcifications noted.  - Cardiac MRI (11/23): LVEF 23%, moderate LV dilation, septal-lateral dyssynchrony, RVEF 46%, nonspecific RV insertion site LGE (consistent with NICM).  7. LBBB 8. COVID-19 x 3  Social History   Socioeconomic History   Marital status: Legally Separated    Spouse name: Not on file   Number of children: 10   Years of education: Not on file   Highest education level: Not on file  Occupational History   Occupation: retired  Tobacco Use   Smoking status: Never   Smokeless tobacco: Never  Vaping Use   Vaping  status: Never Used  Substance and Sexual Activity   Alcohol use: Not Currently    Comment: occassionally   Drug use: No   Sexual activity: Not Currently  Other Topics Concern   Not on file  Social History Narrative   Not on file   Social Determinants of Health   Financial Resource Strain: Not on file  Food Insecurity: No Food Insecurity (04/23/2022)   Hunger Vital Sign    Worried About Running Out of Food in the Last Year: Never true    Ran Out of Food in the Last Year: Never true  Transportation Needs: No Transportation Needs (04/23/2022)   PRAPARE - Administrator, Civil Service (Medical): No    Lack of Transportation (Non-Medical): No  Physical Activity: Not on file  Stress: Not on file  Social Connections: Not on file  Intimate Partner Violence: Not At Risk (04/23/2022)   Humiliation, Afraid, Rape, and Kick questionnaire    Fear of Current or Ex-Partner: No    Emotionally Abused: No    Physically Abused: No    Sexually Abused: No   Family History  Problem Relation Age of Onset   Hypertension Mother    Heart failure Mother    Hyperlipidemia Mother    Stroke Mother    Hypertension Father    Heart attack Father    COPD Father    Diabetes Paternal Aunt    Diabetes Paternal Grandmother    Colon polyps Other  niece   Stomach cancer Neg Hx    Rectal cancer Neg Hx    Esophageal cancer Neg Hx    Colon cancer Neg Hx    ROS: All systems reviewed and negative except as per HPI.   Current Outpatient Medications  Medication Sig Dispense Refill   amantadine (SYMMETREL) 100 MG capsule Take 100 mg by mouth 2 (two) times daily.     BREO ELLIPTA 200-25 MCG/INH AEPB Inhale 1 puff into the lungs daily.     Calcium Carb-Cholecalciferol (CALCIUM 600+D3 PO) Take 1 tablet by mouth daily.     cetirizine (ZYRTEC) 10 MG tablet Take 10 mg by mouth daily.     dapagliflozin propanediol (FARXIGA) 10 MG TABS tablet Take 1 tablet (10 mg total) by mouth daily before  breakfast. 90 tablet 3   ENTRESTO 24-26 MG Take 1 tablet by mouth 2 (two) times daily. 60 tablet 3   fluticasone (FLONASE) 50 MCG/ACT nasal spray Place 2 sprays into both nostrils daily.     furosemide (LASIX) 20 MG tablet Take 1 tablet (20 mg total) by mouth daily. 30 tablet 1   ibuprofen (ADVIL) 400 MG tablet Take 400 mg by mouth every 8 (eight) hours as needed for cramping, moderate pain or fever.     loperamide (IMODIUM) 2 MG capsule Take 2 mg by mouth as needed for diarrhea or loose stools.     melatonin 3 MG TABS tablet Take 3 mg by mouth at bedtime.     mirtazapine (REMERON) 7.5 MG tablet Take 1 tablet (7.5 mg total) by mouth at bedtime. 30 tablet 0   omeprazole (PRILOSEC) 20 MG capsule Take 1 capsule (20 mg total) by mouth daily. (Patient taking differently: Take 20 mg by mouth 2 (two) times daily before a meal.) 30 capsule 0   risperiDONE (RISPERDAL) 1 MG tablet Take 1 tablet (1 mg total) by mouth at bedtime. 30 tablet 1   spironolactone (ALDACTONE) 25 MG tablet Take 0.5 tablets (12.5 mg total) by mouth daily. 45 tablet 3   No current facility-administered medications for this visit.   Vitals:   02/25/23 1201  BP: 104/65  Pulse: 90  SpO2: 95%   GENERAL: Well nourished, well developed, and in no apparent distress at rest.  HEENT: Negative for arcus senilis or xanthelasma. There is no scleral icterus.  The mucous membranes are pink and moist.   NECK: Supple, No masses. Normal carotid upstrokes without bruits. No masses or thyromegaly.    CHEST: There are no chest wall deformities. There is no chest wall tenderness. Respirations are unlabored.  Lungs- CTA B/L CARDIAC:  JVP: 7 cm          Normal rate with regular rhythm. No murmurs, rubs or gallops.  Pulses are 2+ and symmetrical in upper and lower extremities. 1+ edema.  ABDOMEN: Soft, non-tender, non-distended. There are no masses or hepatomegaly. There are normal bowel sounds.  EXTREMITIES: Warm and well perfused with no cyanosis,  clubbing.  LYMPHATIC: No axillary or supraclavicular lymphadenopathy.  NEUROLOGIC: Patient is oriented x3 with no focal or lateralizing neurologic deficits.  PSYCH: Patients affect is appropriate, there is no evidence of anxiety or depression.  SKIN: Warm and dry; no lesions or wounds.    Assessment/Plan: 1. Chronic systolic CHF:  Diagnosed in 4/23 with echo showed EF 25%.  Suspect nonischemic cardiomyopathy, cardiac PET showed no ischemia and cardiac MRI did not show evidence for MI.  No ETOH history.  Mother with HF in her 47s.  ?  post-viral myocarditis, had 3 bouts with COVID-19.  Last echo in 10/23 showeed EF 30-35% withj normal RV.  Cardiac MRI in 11/23 showed LVEF 23%, moderate LV dilation, septal-lateral dyssynchrony, RVEF 46%, nonspecific RV insertion site LGE (consistent with NICM). NYHA class II symptoms.  She is not volume overloaded on exam.  GDMT titration has apparently been limited by orthostasis, but she denies current orthostatic symptoms.  She is unfortunately taking losartan, Entresto, and midodrine.  - Continue Entresto 24/26mg  BID; SBP 104-110.  - Increase lasix to 20mg  BID for x 2 days; then 20 daily. She is very mildly hypervolemic. - Continue spironolactone 12.5 daily.  - Continue Farxiga 10 mg daily.  - Repeat BMP/BNP today.  - Now that she is on appropriate GDMT, can plan on repeat TTE in 34m.  2. Schizophrenia: Appropriate with conversation today.   Follow up in 2-37m with Dr. Bethel Born [facility] 02/25/2023

## 2023-03-16 ENCOUNTER — Other Ambulatory Visit: Payer: Self-pay | Admitting: Family

## 2023-03-22 DIAGNOSIS — Z87440 Personal history of urinary (tract) infections: Secondary | ICD-10-CM | POA: Insufficient documentation

## 2023-03-22 DIAGNOSIS — N3281 Overactive bladder: Secondary | ICD-10-CM | POA: Insufficient documentation

## 2023-03-22 NOTE — Progress Notes (Unsigned)
Name: [name] DOB: 1940/11/04 MRN: [id number]  History of Present Illness: Ms. See is a 82 y.o. female who presents today for follow up visit at [facility] [place]. She is accompanied by her niece [name] (patient's POA) who assists with providing history today, however patient is a good historian. Also accompanied by Vear Clock, PCA from SNF. - GU history: 1. Solitary right kidney s/p left nephrectomy in 2010. - Per note by Dr. Heron Nay on 07/07/2015: Underwent "several complicated surgeries at [facility] for diverticulitis that ultimately resulted in a left nephrectomy, temporary colostomy, and abdominal wall hernia repairs with mesh".  - Renal function appears stable. 2. OAB with urinary frequency, nocturia, urgency, and urge incontinence. - Previously took Toviaz 4 mg daily. - Likely exacerbated by her diuretic medications and glucosuria (due to [place]). 3. Recurrent UTls.  4. Small left anterior bladder diverticulum. Per CT stone on 02/01/2022. 5. Prior episode of  urinary retention.  At last visit with Dr. Ronne Binning on 06/13/2022: - Urinary retention: Resolved; PVR = 17 ml. - Reported nocturia x2 which does not bother her.  - No gross hematuria since last visit.   Since last visit: > 12/04/2022:  - UA: negative for nitrites, leukocytes, blood - Urine microscopy: trace bacteria, 1 WBC/hpf, 1 RBC/hpf - Urine culture grew multiple GU pathogens, predominantly Proteus mirabilis   > 02/11/2023:  - UA: negative for nitrites, leukocytes, blood - Urine microscopy: 1 WBC/hpf, no RBCs or bacteria - Urine culture grew multiple GU pathogens, predominantly Enterococcus faecalis and E. Coli   Today: She reports that when present her UTI symptoms include dysuria, increased urinary urgency, frequency, fatigue, and weakness. She does report however that her SNF checks her urine monthly for med titer check and sometimes have treated her for UTI based on UA  results even when she has not felt like she had acute UTI symptoms.   At baseline she reports urinary frequency, nocturia, urgency, and urge incontinence. Voiding approximately every hour during the day and 2x/night on average. Leaking 2x/week on average; using pullups which are mostly for protection.   She denies acute UTI symptoms today.  She denies dysuria, gross hematuria, straining to void, or sensations of incomplete emptying. She denies flank pain or abdominal pain. She denies fevers, nausea, or vomiting. Reports rare fecal incontinence.  She denies vaginal pain, bleeding, discharge, itching. Reports history of endometriosis and early menopause in her 7s. States she had a laparoscopic procedure in which one ovary was removed.   Fall Screening: Do you usually have a device to assist in your mobility? Yes - walker   Medications: Current Outpatient Medications  Medication Sig Dispense Refill   amantadine (SYMMETREL) 100 MG capsule Take 100 mg by mouth 2 (two) times daily.     BREO ELLIPTA 200-25 MCG/INH AEPB Inhale 1 puff into the lungs daily.     Calcium Carb-Cholecalciferol (CALCIUM 600+D3 PO) Take 1 tablet by mouth daily.     cetirizine (ZYRTEC) 10 MG tablet Take 10 mg by mouth daily.     dapagliflozin propanediol (FARXIGA) 10 MG TABS tablet Take 1 tablet (10 mg total) by mouth daily before breakfast. 90 tablet 3   estradiol (ESTRACE) 0.1 MG/GM vaginal cream Discard plastic applicator. Insert a blueberry size amount (approximately 1 gram) of cream on fingertip inside vagina at bedtime every night for 1 week then every other night. For long term use. 30 g 3   fluticasone (FLONASE) 50 MCG/ACT nasal spray Place 2 sprays  into both nostrils daily.     furosemide (LASIX) 20 MG tablet Take 1 tablet (20 mg total) by mouth daily. 30 tablet 1   ibuprofen (ADVIL) 400 MG tablet Take 400 mg by mouth every 8 (eight) hours as needed for cramping, moderate pain or fever.     loperamide (IMODIUM)  2 MG capsule Take 2 mg by mouth as needed for diarrhea or loose stools.     melatonin 3 MG TABS tablet Take 3 mg by mouth at bedtime.     mirtazapine (REMERON) 7.5 MG tablet Take 1 tablet (7.5 mg total) by mouth at bedtime. 30 tablet 0   omeprazole (PRILOSEC) 20 MG capsule Take 1 capsule (20 mg total) by mouth daily. (Patient taking differently: Take 20 mg by mouth 2 (two) times daily before a meal.) 30 capsule 0   risperiDONE (RISPERDAL) 1 MG tablet Take 1 tablet (1 mg total) by mouth at bedtime. 30 tablet 1   sacubitril-valsartan (ENTRESTO) 24-26 MG Take 1 tablet by mouth 2 (two) times daily. 60 tablet 5   spironolactone (ALDACTONE) 25 MG tablet Take 0.5 tablets (12.5 mg total) by mouth daily. 45 tablet 3   No current facility-administered medications for this visit.    Allergies: Allergies  Allergen Reactions   Haloperidol Anaphylaxis   Aripiprazole Other (See Comments)    Bad thoughts    Chlorpheniramine-Pse-Ibuprofen     Other reaction(s): Cough   Calcitriol    Simvastatin     Past Medical History:  Diagnosis Date   Anemia 1990   hx of   Anxiety    Arthritis 2004   Asthma 1995   uses inhaler   CHF (congestive heart failure) (HCC)    Chronic HFrEF (heart failure with reduced ejection fraction) (HCC)    a. 07/2021 Echo: EF 25%; b. 01/2022 Echo: EF 30-35%, glob HK, GrI DD, nl RV fxn, mild-mod MR; c. 02/2022 cMRI: EF 23%.   CKD (chronic kidney disease), stage III (HCC) 2010   a. Solitary kidney - s/p L nephrectomy in 2010.   Colon polyp    Complication of anesthesia 1966   first surgery she had- had a hard time waking up from surgery   COPD (chronic obstructive pulmonary disease) (HCC)    Delusional disorder (HCC)    Six delusional disorder   Diverticulosis 2003   GERD (gastroesophageal reflux disease)    on meds   Hypertension    NICM (nonischemic cardiomyopathy) (HCC)    a. 07/2021 Echo: EF 25%; b. 11/2021 Lexi PET/CT: EF 28% (35% w/ stress). Cor Ca2+. No ischemia;  c.01/2022 Echo: EF 30-35%, glob HK, GrI DD, nl RV fxn, mild-mod MR; d. 02/2022 cMRI: EF 23%, diff HK, septal-lateral dyssynchrony w/ LBBB. Nonspecific RV insertion site LGE, borderline elev extracellular volume percentage sugg some replacement fibrosis-->NICM.   Schizophrenia (HCC)    Severe left ventricular systolic dysfunction (LVSD)    EF 23% by cardiac MRI   Past Surgical History:  Procedure Laterality Date   APPENDECTOMY  1966   CATARACT EXTRACTION     Bilateral   CHOLECYSTECTOMY  2004   COLON SURGERY  2008   COLONOSCOPY  2013   JP-MAC-moviprep(good)-TA   COLOSTOMY     COLOSTOMY TAKEDOWN     DIAGNOSTIC LAPAROSCOPY     exploratory for endometriosis   HEEL SPUR SURGERY     Right, Metal Plate in Heel   INCISIONAL HERNIA REPAIR  2010   NEPHRECTOMY Left 2008   As complication of partial colectomy for  diverticulitis   POLYPECTOMY  2013   TA   TONSILLECTOMY     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   Procedure: LEFT TOTAL KNEE ARTHROPLASTY;  Surgeon: Jene Every, MD;  Location: [facility];  Service: Orthopedics;  Laterality: Left;  Adductor Block   Family History  Problem Relation Age of Onset   Hypertension Mother    Heart failure Mother    Hyperlipidemia Mother    Stroke Mother    Hypertension Father    Heart attack Father    COPD Father    Diabetes Paternal Aunt    Diabetes Paternal Grandmother    Colon polyps Other        niece   Stomach cancer Neg Hx    Rectal cancer Neg Hx    Esophageal cancer Neg Hx    Colon cancer Neg Hx    Social History   Socioeconomic History   Marital status: Legally Separated    Spouse name: Not on file   Number of children: 10   Years of education: Not on file   Highest education level: Not on file  Occupational History   Occupation: retired  Tobacco Use   Smoking status: Never   Smokeless tobacco: Never  Vaping Use   Vaping status: Never Used  Substance and Sexual Activity   Alcohol use: Not Currently    Comment:  occassionally   Drug use: No   Sexual activity: Not Currently  Other Topics Concern   Not on file  Social History Narrative   Not on file   Social Drivers of Health   Financial Resource Strain: Not on file  Food Insecurity: No Food Insecurity (04/23/2022)   Hunger Vital Sign    Worried About Running Out of Food in the Last Year: Never true    Ran Out of Food in the Last Year: Never true  Transportation Needs: No Transportation Needs (04/23/2022)   PRAPARE - Administrator, Civil Service (Medical): No    Lack of Transportation (Non-Medical): No  Physical Activity: Not on file  Stress: Not on file  Social Connections: Not on file  Intimate Partner Violence: Not At Risk (04/23/2022)   Humiliation, Afraid, Rape, and Kick questionnaire    Fear of Current or Ex-Partner: No    Emotionally Abused: No    Physically Abused: No    Sexually Abused: No    Review of Systems Constitutional: Patient denies any unintentional weight loss or change in strength lntegumentary: Patient denies any pruritus Cardiovascular: Patient denies chest pain or syncope Respiratory: Patient denies shortness of breath Gastrointestinal: Patient denies nausea, vomiting, constipation, or diarrhea. Rare fecal incontinence. Musculoskeletal: Patient denies muscle cramps or weakness Neurologic: Patient denies convulsions or seizures Allergic/Immunologic: Patient denies recent allergic reaction(s) Hematologic/Lymphatic: Patient denies bleeding tendencies Endocrine: Patient denies heat/cold intolerance  GU: As per HPI.  OBJECTIVE Vitals:   03/27/23 1332  BP: (!) 98/54  Pulse: 83  Temp: 99.1 F (37.3 C)   There is no height or weight on file to calculate BMI.  Physical Examination Constitutional: No obvious distress; patient is non-toxic appearing  Cardiovascular: No visible lower extremity edema.  Respiratory: The patient does not have audible wheezing/stridor; respirations do not appear labored   Gastrointestinal: Abdomen non-distended Musculoskeletal: Normal ROM of UEs  Skin: No obvious rashes/open sores  Neurologic: CN 2-12 grossly intact Psychiatric: Answered questions appropriately with normal affect  Hematologic/Lymphatic/Immunologic: No obvious bruises or sites of spontaneous bleeding  Urine microscopy: > 30 WBC/hpf, 0-2 RBC/hpf, many  bacteria PVR: 20 ml  ASSESSMENT History of recurrent UTI (urinary tract infection) - Plan: Urinalysis, Routine w reflex microscopic, BLADDER SCAN AMB NON-IMAGING, estradiol (ESTRACE) 0.1 MG/GM vaginal cream  Solitary kidney, acquired - Plan: Urinalysis, Routine w reflex microscopic, BLADDER SCAN AMB NON-IMAGING  OAB (overactive bladder) - Plan: Urinalysis, Routine w reflex microscopic, BLADDER SCAN AMB NON-IMAGING  1. Recurrent UTls.  We discussed the possible etiologies of recurrent UTls including ascending infection related to intercourse; vaginal atrophy; glucosuria (due to [place]), transmural infection that has been treated incompletely; urinary tract stones; incomplete bladder emptying with urinary stasis; kidney or bladder tumor; urethral diverticulum; and colonization of  vagina and urinary tract with pathologic, adherent organisms.   We reviewed UTI symptoms which may include: dysuria, acutely increased urinary urgency and/or frequency, fever, acute mental status change / confusion, general malaise, fatigue, weakness.  For UTI prevention we discussed the following options, for which detailed information has been included in Patient Information section of today's After Visit Summary. > Maintain adequate fluid intake daily to flush out the urinary tract. > OTC supplements for UTI prevention > Topical vaginal estrogen for vaginal atrophy.  - The etiology and consequences of urogenital epithelial atrophy was explained to patient.  - The normal bacterial flora that colonizes the perineum may contribute to UTI risk because the thin urethral  epithelium allows the bacteria to become adherent and the change in vaginal pH can disrupt the vaginal / urethral microbiome and allow for bacterial overgrowth.  - Patient was advised that topical vaginal estrogen replacement will take about 3 months to restore the vaginal pH.  - We discussed that there have been studies that evaluate use of low-dose intravaginal estrogen cream that shows minimal systemic absorption that is negligible after 3 weeks. There have been no studies indicating increased risk of contributing to breast cancer development or recurrence.  If recurrent UTls persist (positive urine culture + acute UTI symptoms), may consider UTI prophylaxis with a daily low dose antibiotic in the future.  Patient ultimately decided to start topical vaginal estrogen cream.   UA today appears abnormal however patient is asymptomatic for UTI, therefore acute antibiotic treatment is not advised at this time. The rationale for this was discussed with the patient.  - We discussed the difference between asymptomatic bacteriuria versus symptoms suggestive of a possible UTI which may include: fever, confusion, malaise, increased fatigue, dysuria, acute increase in urinary frequency / urgency / urge incontinence.  - In the absence of active UTI symptoms, a positive urine culture is considered to represent colonization of the urinary tract which does not warrant acute antibiotic treatment. Urine testing / cultures are not advised in the absence of acute UTI symptoms given that acute antibiotic treatment would not be indicated regardless of the findings. Patient was advised that positive urine cultures only warrant acute antibiotic treatment if UTI symptoms are present.  - We discussed antibiotic stewardship and goal to minimize risk for developing antibiotic resistance. - May need I&O sterile urine specimen collection in the future if UTI suspected due to likely vaginal contamination of voided urine  specimens.   2. OAB with urinary frequency, nocturia, urgency, and urge incontinence. - Likely exacerbated by her diuretic medications and glucosuria (due to [place]). - We discussed the following management options in detail including potential benefits, risks, and side effects: Behavioral therapy: Modify fluid intake Decreasing bladder irritants (such as caffeine) Medication(s): - Not a safe candidate for anticholinergic medications based on age, comorbidities (including pre-existing dry mouth),  and potential side effects.  - Could consider a beta-3 agonist medication (Gemtesa or Myrbetriq).  She decided to work on minimizing caffeine intake.   Will plan for follow up in 3 months or sooner if needed. Pt verbalized understanding and agreement. All questions were answered.  PLAN Advised the following: 1. For OAB: Minimize caffeine intake. 2. For UTI management / prevention: - Start topical vaginal estrogen cream as prescribed. - Do not treat with antibiotics regardless of abnormal urinalysis results unless patient is also have new acute UTI symptoms (as described above). If uncertain, with patient's consent the SNF staff may perform I&O straight cath to collect sterile urine specimen for urine culture. - Maintain adequate fluid intake daily to flush out the urinary tract. - Consider OTC supplements for UTI prevention. 5. Return in about 3 months (around 06/25/2023) for UA, PVR, & f/u with Evette Georges NP.  Orders Placed This Encounter  Procedures   Urinalysis, Routine w reflex microscopic   BLADDER SCAN AMB NON-IMAGING    It has been explained that the patient is to follow regularly with their PCP in addition to all other providers involved in their care and to follow instructions provided by these respective offices. Patient advised to contact urology clinic if any urologic-pertaining questions, concerns, new symptoms or problems arise in the interim period.  Patient Instructions   Recommendations regarding UTI prevention / management:  Options when UTI symptoms occur: 1. Call [facility] [place] to request urgent / same-day visit (phone # [phone]).  2. Call your Primary Care Provider (PCP) office to request urgent / same-day visit. Be sure to request for urine culture to be ordered and have results faxed to Urology (fax # [phone]).  3. Go to urgent care. Be sure to request for urine culture to be ordered and have results faxed to Urology (fax # [phone]).   For bladder pain/ burning with urination: - Can take OTC Pyridium (phenazopyridine; commonly known under the "AZO" brand) for a few days as needed. Limit use to no more than 3 days consecutively due to risk for methemoglobinemia, liver function issues, and bone health damage with long term use of Pyridium.  Routine use for UTI prevention: - Topical vaginal estrogen for vaginal atrophy. - Adequate daily fluid intake to flush out the urinary tract. - Go to the bathroom to urinate every 4-6 hours while awake to minimize urinary stasis / bacterial overgrowth in the bladder. - Proanthocyanidin (PAC) supplement 36 mg daily; must be soluble (insoluble form of PAC will be ineffective). Recommended brand: Ellura. This is an over-the-counter supplement (often must be found/ purchased online) supplement derived from cranberries with concentrated active component: Proanthocyanidin (PAC) 36 mg daily. Decreases bacterial adherence to bladder lining.  - D-mannose powder (2 grams daily). This is an over-the-counter supplement which decreases bacterial adherence to bladder lining (it is a sugar that inhibits bacterial adherence to urothelial cells by binding to the pili of enteric bacteria). Take as per manufacturer recommendation. Can be used as an alternative or in addition to the concentrated cranberry supplement.  - Vitamin C supplement to acidify urine to minimize bacterial growth.  - Probiotic to maintain  healthy vaginal microbiome to suppress bacteria at urethral opening. Brand recommendations: Darrold Junker (includes probiotic & D-mannose ), Feminine Balance (highest concentration of lactobacillus) or Hyperbiotic Pro 15.  Note for patients with diabetes:  - Be aware that D-mannose contains sugar.  Note for patients with interstitial cystitis (IC):  - Patients with IC should typically avoid  cranberry/ PAC supplements and Vitamin C supplements due to their acidity, which may exacerbate IC-related bladder pain. - Symptoms of true bacterial UTI can overlap / mimic symptoms of an IC flare up. Antibiotic use is NOT indicated for IC flare ups. Urine culture needed prior to antibiotic treatment for IC patients. The goal is to minimize your risk for developing antibiotic-resistant bacteria.    Vulvovaginal atrophy I Genitourinary syndrome of menopause (GSM):  What it is: Changes in the vaginal environment (including the vulva and urethra) including: Thinning of the epithelium (skin/ mucosa surface) Can contribute to urinary urgency and frequency Can contribute to dryness, itching, irritation of the vulvar and vaginal tissue Can contribute to pain with intercourse Can contribute to physical changes of the labia, vulva, and vagina such as: Narrowing of the vaginal opening Decreased vaginal length Loss of labial architecture Labial adhesions Pale color of vulvovaginal tissue  Loss of pubic hair Allows bacteria to become adherent  Results in increased risk for urinary tract infection (UTI) due to bacterial overgrowth and migration up the urethra into the bladder Change in vaginal pH (acid/ base balance) Allows for alteration / disruption of the normal bacterial flora / microbiome Results in increased risk for urinary tract infection (UTI) due to bacterial overgrowth  Treatment options: Over-the-counter lubricants (see list below). Prescription vaginal estrogen replacement. Options: Topical vaginal  estrogen cream Estrace, Premarin, or compounded estradiol cream/ gel We advise: Discard plastic applicator as that tends to use more medication than you need, which is not harmful but wastes / uses up the medication. Also the plastic applicator may cause discomfort. Insert blueberry size amount of medication via the tip of your finger inside vagina nightly for 1 week then 2-3 times per week (long term). Estring vaginal ring Exchanged every 3 months (either at home or in office by provider) Vagifem vaginal tablet Inserted nightly for 2 weeks then twice a week (long term) lntrarosa vaginal suppository Vaginal DHEA: converts to estrogen in vaginal tissue without systemic effect Inserted nightly (long term) Vaginal laser therapy (Mona Lisa touch) Performed in 3 treatments each 6 weeks apart (available in our [place] office). Can feel like a sunburn for 3-4 days after each treatment until new skin heals in. Usually not covered by insurance. Estimated cost is $1500 for all 3 sessions.  FYI regarding prescription vaginal estrogen treatment options: All topical vaginal estrogen replacement options are equivalent in terms of efficacy. Topical vaginal estrogen replacement will take about 3 months to be effective. OK to have sex with any of the topical vaginal estrogen replacement options. Topical vaginal estrogen replacement may sting/burn initially due to severe dryness, which will improve with ongoing treatment. There have been studies that evaluate use of low-dose intravaginal estrogen that show minimal systemic absorption which is negligible after 3 weeks. There have been no studies indicating increased risk of contributing to cancer development or recurrence.  Topical vaginal estrogen cream safe to use with breast cancer history [email or web address]  Topical vaginal estrogen cream safe to use with blood clot  history [email or web address]   Lubricants and Moisturizers for Treating Genitourinary Syndrome of Menopause and Vulvovaginal Atrophy Treatment Comments I Available Products   Lubricants   Water-based Ingredients: Deionized water, glycerin, propylene glycol; latex safe; rare irritation; dry out with extended sexual activity Astroglide, Good Clean Love, K-Y Jelly, Natural, Organic, Pink, Sliquid, Sylk, Yes    Oil Based Ingredients: avocado, olive, peanut, corn; latex safe; can be used with silicone products; staining; safe (unless peanut allergy); non-irritating Coconut oil,  vegetable oil, vitamin E oil  Silicone-Based Ingredients: Silicone polymers; staining; typically nonirritating, long lasting; waterproof; should not be used with silicone dilators, sexual toys, or gynecologic products Astroglide X, Oceanus Ultra Pure, Pink Silicone, Pjur Eros, Replens Silky Smooth, Silicone Premium JO, SKYN, Uberlube, [employer] Based Minimize harm to sperm motility; designed Astroglide TTC, Conceive Plus, Pre for couples trying to conceive Seed, Yes Baby  Fertility Friendly Minimize harm to sperm motility; designed Astroglide, TTC, Conceive Plus, Pre for couples trying to conceive Seed, Yes Baby  Vaginal Moisturizers   Vaginal Moisturizers For maintenance use 1 to 3 times weekly; can benefit women with dryness, chafing with AOL, and recurrent vaginal infections irrespective of sexual activity timing Balance Active Menopause Vaginal Moisturizing Lubricant, Canesintima Intimate Moisturizer, Replens, Rephresh, Sylk Natural Intimate Moisturizer, Yes Vaginal Moisturizer  Hybrids Properties of both water and silicone-based products (combination of a vaginal lubricant and moisturizer); Non-irritating; good option for women with allergies and  sensitivities Lubrigyn, Luvena  Suppositories Hyaluronic acid to retain moisture Revaree  Vulvar Soothing Creams/Oils    Medicated CreamsP ain and burn relief; Ingredients: 4% Lidocaine, Aloe Vera gel Releveum ([place])  Non-Medicated Creams For anti-itch and moisture/maintenance; Ingredients: Coconut oil, Avocado oil, Shea Butter, Olive oil, Vitamin E Vajuvenate, Vmagic  Oils !For moisture/maintenance !Coconut oil, Vitamin E oil, Emu oil          Overactive bladder (OAB) overview for patients:  Symptoms may include: urinary urgency ("gotta go" feeling) urinary frequency (voiding >8 times per day) night time urination (nocturia) urge incontinence of urine (UUI)  While we do not know the exact etiology of OAB, several treatment options exist including:  Behavioral therapy: Reducing fluid intake Decreasing bladder stimulants (such as caffeine) and irritants (such as acidic food, spicy foods, alcohol) Urge suppression strategies Bladder retraining via timed voiding  Pelvic floor physical therapy  Medication(s) - can use one or both of the drug classes below. Anticholinergic / antimuscarinic medications:  Mechanism of action: Activate M3 receptors to reduce detrusor stimulation and increase bladder capacity   (parasympathetic nervous system). Effect: Relaxes the bladder to decrease overactivity, increase bladder storage capacity, and increase time between voids. Onset: Slow acting (may take 8-12 weeks to determine efficacy). Medications include: Vesicare (Solifenacin), Ditropan (Oxybutynin), Detrol (Tolterodine), Toviaz (Fesoterodine), Sanctura (Trospium), Urispas (Flavoxate), Enablex (Darifenacin), Bentyl (Dicyclomine), Levsin (Hyoscyamine ). Potential side effects include but are not limited to: Dry eyes, dry mouth, constipation, cognitive impairment, dementia risk with long term use, and urinary retention/ incomplete bladder emptying. Insurance companies generally  prefer for patients to try 1-2 anticholinergic / antimuscarinic medications first due to low cost. Some exceptions are made based on patient-specific comorbidities / risk factors. Beta-3 agonist medications: Mechanism of action: Stimulates selective B3 adrenergic receptors to cause smooth muscle bladder relaxation (sympathetic nervous system). Effect: Relaxes the bladder to decrease overactivity, increase bladder storage capacity, and increase time between voids. Onset: Slow acting (may take 8-12 weeks to determine efficacy). Medications include: Myrbetriq (Mirabegron) and Vibegron Leslye Peer). Potential side effects include but are not limited to: urinary retention / incomplete bladder emptying and elevated blood pressure (more likely to occur in individuals with pre-existing uncontrolled hypertension). These medications tend to be more expensive than the anticholinergic / antimuscarinic medications.   For patients with refractory OAB (if the above treatment options have been unsuccessful): Posterior tibial nerve stimulation (PTNS). Small acupuncture-type needle inserted near ankle with electric current to stimulate bladder via posterior tibial nerve pathway. Initially requires 12 weekly in-office treatments  lasting 30 minutes each; followed by monthly in-office treatments lasting 30 minutes each for 1 year.  Bladder Botox injections. How it is done: Typically done via in-office cystoscopy; sometimes done in the OR depending on the situation. The bladder is numbed with lidocaine instilled via a catheter. Then the urologist injects Botox into the bladder muscle wall in about 20 locations. Causes local paralysis of the bladder muscle at the injection sites to reduce bladder muscle overactivity / spasms. The effect lasts for approximately 6 months and cannot be reversed once performed. Risks may included but are not limited to: infection, incomplete bladder emptying/ urinary retention, short term need for  self-catheterization or indwelling catheter, and need for repeat therapy. There is a 5-12% chance of needing to catheterize with Botox - that usually resolves in a few months as the Botox wears off. Typically Botox injections would need to be repeated every 3-12 months since this is not a permanent therapy.  Sacral neuromodulation trial (Medtronic lnterStim or Axonics implant). Sacral neuromodulation is FDA-approved for uncontrolled urinary urgency, urinary frequency, urinary urge incontinence, non-obstructive urinary retention, or fecal incontinence. It is not FDA-approved as a treatment for pain. The goal of this therapy is at least a 50% improvement in symptoms. It is NOT realistic to expect a 100% cure. This is a a 2-step outpatient procedure. After a successful test period, a permanent wire and generator are placed in the OR. We discussed the risk of infection. We reviewed the fact that about 30% of patients fail the test phase and are not candidates for permanent generator placement. During the 1-2 week trial phase, symptoms are documented by the patient to determine response. If patient gets at least a 50% improvement in symptoms, they may then proceed with Step 2. Step 1: Trial lead placement. Per physician discretion, may done one of two ways: Percutaneous nerve evaluation (PNE) in the [facility] urology office. Performed by urologist under local anesthesia (numbing the area with lidocaine) using a spinal needle for placement of test wire, which usually stays in place for 5-7 days to determine therapy response. Test lead placement in OR under anesthesia. Usually stays in place 2 weeks to determine therapy response. > Step 2: Permanent implantation of sacral neuromodulation device, which is performed in the OR.  Sacral neuromodulation implants: All are conditionally MRI safe. Manufacturer: Medtronic Website:  [email or web address]-[email or web address] Options: lnterStim X: Non-rechargeable. The battery lasts 10 years on average. lnterStim Micro: Rechargeable. The battery lasts 15 years on average and must be charged routinely. Approximately 50% smaller implant than lnterStim X implant.  Manufacturer: Axonics Website: Findrealrelief.[email or web address].com Options: Non-rechargeable (Axonics F15): The battery lasts 15 years on average. Rechargeable (Axonics R20): The battery lasts 20 years on average and must be charged in office for about 1 hour every 6-10 months on average. Approximately 50% smaller implant than Axonics non-rechargeable implant.  Note: Generally the rechargeable devices are only advised for very small or thin patients who may not have sufficient adipose tissue to comfortably overlay the implanted device.  Suprapubic catheter (SP tube) placement. Only done in severely refractory OAB when all other options have failed or are not a viable treatment choice depending on patient factors. Involves placement of a catheter through the lower abdomen into the bladder to continuously drain the bladder into an external collection bag, which patient can then empty at their convenience every few hours. Done via an outpatient surgical procedure in the OR under anesthesia. Risks may included but are not limited to: surgical site pain,  infections, skin irritation / breakdown, chronic bacteriuria, symptomatic UTls. The SP tube must stay in place continuously. This is a reversible procedure however - the insertion site will close if catheter is removed for more than a few hours. The SP tube must be exchanged routinely every 4 weeks to prevent the catheter from becoming clogged with sediment. SP tube exchanges are typically performed at a urology nurse visit or by a home health nurse.   Electronically signed by:  Donnita Falls, FNP    03/27/23    3:12 PM

## 2023-03-27 ENCOUNTER — Encounter: Payer: Self-pay | Admitting: Urology

## 2023-03-27 ENCOUNTER — Ambulatory Visit: Payer: 59 | Admitting: Urology

## 2023-03-27 VITALS — BP 98/54 | HR 83 | Temp 99.1°F

## 2023-03-27 DIAGNOSIS — Z905 Acquired absence of kidney: Secondary | ICD-10-CM

## 2023-03-27 DIAGNOSIS — N3281 Overactive bladder: Secondary | ICD-10-CM | POA: Diagnosis not present

## 2023-03-27 DIAGNOSIS — Z87440 Personal history of urinary (tract) infections: Secondary | ICD-10-CM | POA: Diagnosis not present

## 2023-03-27 LAB — URINALYSIS, ROUTINE W REFLEX MICROSCOPIC
Bilirubin, UA: NEGATIVE
Ketones, UA: NEGATIVE
Nitrite, UA: POSITIVE — AB
Protein,UA: NEGATIVE
RBC, UA: NEGATIVE
Specific Gravity, UA: 1.01 (ref 1.005–1.030)
Urobilinogen, Ur: 0.2 mg/dL (ref 0.2–1.0)
pH, UA: 6 (ref 5.0–7.5)

## 2023-03-27 LAB — MICROSCOPIC EXAMINATION: WBC, UA: >30 /[HPF] — AB (ref 0–5)

## 2023-03-27 LAB — BLADDER SCAN AMB NON-IMAGING: Scan Result: 20

## 2023-03-27 MED ORDER — ESTRADIOL 0.1 MG/GM VA CREA: TOPICAL_CREAM | VAGINAL | 3 refills | Status: DC

## 2023-03-27 NOTE — Patient Instructions (Signed)
Recommendations regarding UTI prevention / management:  Options when UTI symptoms occur: 1. Call [facility] [place] to request urgent / same-day visit (phone # [phone]).  2. Call your Primary Care Provider (PCP) office to request urgent / same-day visit. Be sure to request for urine culture to be ordered and have results faxed to Urology (fax # [phone]).  3. Go to urgent care. Be sure to request for urine culture to be ordered and have results faxed to Urology (fax # [phone]).   For bladder pain/ burning with urination: - Can take OTC Pyridium (phenazopyridine; commonly known under the "AZO" brand) for a few days as needed. Limit use to no more than 3 days consecutively due to risk for methemoglobinemia, liver function issues, and bone health damage with long term use of Pyridium.  Routine use for UTI prevention: - Topical vaginal estrogen for vaginal atrophy. - Adequate daily fluid intake to flush out the urinary tract. - Go to the bathroom to urinate every 4-6 hours while awake to minimize urinary stasis / bacterial overgrowth in the bladder. - Proanthocyanidin (PAC) supplement 36 mg daily; must be soluble (insoluble form of PAC will be ineffective). Recommended brand: Ellura. This is an over-the-counter supplement (often must be found/ purchased online) supplement derived from cranberries with concentrated active component: Proanthocyanidin (PAC) 36 mg daily. Decreases bacterial adherence to bladder lining.  - D-mannose powder (2 grams daily). This is an over-the-counter supplement which decreases bacterial adherence to bladder lining (it is a sugar that inhibits bacterial adherence to urothelial cells by binding to the pili of enteric bacteria). Take as per manufacturer recommendation. Can be used as an alternative or in addition to the concentrated cranberry supplement.  - Vitamin C supplement to acidify urine to minimize bacterial growth.  - Probiotic to maintain  healthy vaginal microbiome to suppress bacteria at urethral opening. Brand recommendations: Darrold Junker (includes probiotic & D-mannose ), Feminine Balance (highest concentration of lactobacillus) or Hyperbiotic Pro 15.  Note for patients with diabetes:  - Be aware that D-mannose contains sugar.  Note for patients with interstitial cystitis (IC):  - Patients with IC should typically avoid cranberry/ PAC supplements and Vitamin C supplements due to their acidity, which may exacerbate IC-related bladder pain. - Symptoms of true bacterial UTI can overlap / mimic symptoms of an IC flare up. Antibiotic use is NOT indicated for IC flare ups. Urine culture needed prior to antibiotic treatment for IC patients. The goal is to minimize your risk for developing antibiotic-resistant bacteria.    Vulvovaginal atrophy I Genitourinary syndrome of menopause (GSM):  What it is: Changes in the vaginal environment (including the vulva and urethra) including: Thinning of the epithelium (skin/ mucosa surface) Can contribute to urinary urgency and frequency Can contribute to dryness, itching, irritation of the vulvar and vaginal tissue Can contribute to pain with intercourse Can contribute to physical changes of the labia, vulva, and vagina such as: Narrowing of the vaginal opening Decreased vaginal length Loss of labial architecture Labial adhesions Pale color of vulvovaginal tissue Loss of pubic hair Allows bacteria to become adherent  Results in increased risk for urinary tract infection (UTI) due to bacterial overgrowth and migration up the urethra into the bladder Change in vaginal pH (acid/ base balance) Allows for alteration / disruption of the normal bacterial flora / microbiome Results in increased risk for urinary tract infection (UTI) due to bacterial overgrowth  Treatment options: Over-the-counter lubricants (see list below). Prescription vaginal estrogen replacement. Options: Topical vaginal  estrogen  cream Estrace, Premarin, or compounded estradiol cream/ gel We advise: Discard plastic applicator as that tends to use more medication than you need, which is not harmful but wastes / uses up the medication. Also the plastic applicator may cause discomfort. Insert blueberry size amount of medication via the tip of your finger inside vagina nightly for 1 week then 2-3 times per week (long term). Estring vaginal ring Exchanged every 3 months (either at home or in office by provider) Vagifem vaginal tablet Inserted nightly for 2 weeks then twice a week (long term) lntrarosa vaginal suppository Vaginal DHEA: converts to estrogen in vaginal tissue without systemic effect Inserted nightly (long term) Vaginal laser therapy (Mona Lisa touch) Performed in 3 treatments each 6 weeks apart (available in our [place] office). Can feel like a sunburn for 3-4 days after each treatment until new skin heals in. Usually not covered by insurance. Estimated cost is $1500 for all 3 sessions.  FYI regarding prescription vaginal estrogen treatment options: All topical vaginal estrogen replacement options are equivalent in terms of efficacy. Topical vaginal estrogen replacement will take about 3 months to be effective. OK to have sex with any of the topical vaginal estrogen replacement options. Topical vaginal estrogen replacement may sting/burn initially due to severe dryness, which will improve with ongoing treatment. There have been studies that evaluate use of low-dose intravaginal estrogen that show minimal systemic absorption which is negligible after 3 weeks. There have been no studies indicating increased risk of contributing to cancer development or recurrence.  Topical vaginal estrogen cream safe to use with breast cancer history [email or web address]  Topical vaginal estrogen cream safe to use with blood clot  history [email or web address]   Lubricants and Moisturizers for Treating Genitourinary Syndrome of Menopause and Vulvovaginal Atrophy Treatment Comments I Available Products   Lubricants   Water-based Ingredients: Deionized water, glycerin, propylene glycol; latex safe; rare irritation; dry out with extended sexual activity Astroglide, Good Clean Love, K-Y Jelly, Natural, Organic, Pink, Sliquid, Sylk, Yes    Oil Based Ingredients: avocado, olive, peanut, corn; latex safe; can be used with silicone products; staining; safe (unless peanut allergy); non-irritating Coconut oil, vegetable oil, vitamin E oil  Silicone-Based Ingredients: Silicone polymers; staining; typically nonirritating, long lasting; waterproof; should not be used with silicone dilators, sexual toys, or gynecologic products Astroglide X, Oceanus Ultra Pure, Pink Silicone, Pjur Eros, Replens Silky Smooth, Silicone Premium JO, SKYN, Uberlube, [employer] Based Minimize harm to sperm motility; designed Astroglide TTC, Conceive Plus, Pre for couples trying to conceive Seed, Yes Baby  Fertility Friendly Minimize harm to sperm motility; designed Astroglide, TTC, Conceive Plus, Pre for couples trying to conceive Seed, Yes Baby  Vaginal Moisturizers   Vaginal Moisturizers For maintenance use 1 to 3 times weekly; can benefit women with dryness, chafing with AOL, and recurrent vaginal infections irrespective of sexual activity timing Balance Active Menopause Vaginal Moisturizing Lubricant, Canesintima Intimate Moisturizer, Replens, Rephresh, Sylk Natural Intimate Moisturizer, Yes Vaginal Moisturizer  Hybrids Properties of both water and silicone-based products (combination of a vaginal lubricant and moisturizer); Non-irritating; good option for women with allergies and  sensitivities Lubrigyn, Luvena  Suppositories Hyaluronic acid to retain moisture Revaree  Vulvar Soothing Creams/Oils    Medicated CreamsP ain and burn relief; Ingredients: 4% Lidocaine, Aloe Vera gel Releveum ([place])  Non-Medicated Creams For anti-itch and moisture/maintenance; Ingredients: Coconut oil, Avocado oil, Shea Butter, Olive oil, Vitamin E Vajuvenate, Vmagic  Oils !For moisture/maintenance !Coconut oil, Vitamin E oil, Emu oil  Overactive bladder (OAB) overview for patients:  Symptoms may include: urinary urgency ("gotta go" feeling) urinary frequency (voiding >8 times per day) night time urination (nocturia) urge incontinence of urine (UUI)  While we do not know the exact etiology of OAB, several treatment options exist including:  Behavioral therapy: Reducing fluid intake Decreasing bladder stimulants (such as caffeine) and irritants (such as acidic food, spicy foods, alcohol) Urge suppression strategies Bladder retraining via timed voiding  Pelvic floor physical therapy  Medication(s) - can use one or both of the drug classes below. Anticholinergic / antimuscarinic medications:  Mechanism of action: Activate M3 receptors to reduce detrusor stimulation and increase bladder capacity  (parasympathetic nervous system). Effect: Relaxes the bladder to decrease overactivity, increase bladder storage capacity, and increase time between voids. Onset: Slow acting (may take 8-12 weeks to determine efficacy). Medications include: Vesicare (Solifenacin), Ditropan (Oxybutynin), Detrol (Tolterodine), Toviaz (Fesoterodine), Sanctura (Trospium), Urispas (Flavoxate), Enablex (Darifenacin), Bentyl (Dicyclomine), Levsin (Hyoscyamine ). Potential side effects include but are not limited to: Dry eyes, dry mouth, constipation, cognitive impairment, dementia risk with long term use, and urinary retention/ incomplete bladder emptying. Insurance companies generally  prefer for patients to try 1-2 anticholinergic / antimuscarinic medications first due to low cost. Some exceptions are made based on patient-specific comorbidities / risk factors. Beta-3 agonist medications: Mechanism of action: Stimulates selective B3 adrenergic receptors to cause smooth muscle bladder relaxation (sympathetic nervous system). Effect: Relaxes the bladder to decrease overactivity, increase bladder storage capacity, and increase time between voids. Onset: Slow acting (may take 8-12 weeks to determine efficacy). Medications include: Myrbetriq (Mirabegron) and Vibegron Leslye Peer). Potential side effects include but are not limited to: urinary retention / incomplete bladder emptying and elevated blood pressure (more likely to occur in individuals with pre-existing uncontrolled hypertension). These medications tend to be more expensive than the anticholinergic / antimuscarinic medications.   For patients with refractory OAB (if the above treatment options have been unsuccessful): Posterior tibial nerve stimulation (PTNS). Small acupuncture-type needle inserted near ankle with electric current to stimulate bladder via posterior tibial nerve pathway. Initially requires 12 weekly in-office treatments lasting 30 minutes each; followed by monthly in-office treatments lasting 30 minutes each for 1 year.  Bladder Botox injections. How it is done: Typically done via in-office cystoscopy; sometimes done in the OR depending on the situation. The bladder is numbed with lidocaine instilled via a catheter. Then the urologist injects Botox into the bladder muscle wall in about 20 locations. Causes local paralysis of the bladder muscle at the injection sites to reduce bladder muscle overactivity / spasms. The effect lasts for approximately 6 months and cannot be reversed once performed. Risks may included but are not limited to: infection, incomplete bladder emptying/ urinary retention, short term need for  self-catheterization or indwelling catheter, and need for repeat therapy. There is a 5-12% chance of needing to catheterize with Botox - that usually resolves in a few months as the Botox wears off. Typically Botox injections would need to be repeated every 3-12 months since this is not a permanent therapy.  Sacral neuromodulation trial (Medtronic lnterStim or Axonics implant). Sacral neuromodulation is FDA-approved for uncontrolled urinary urgency, urinary frequency, urinary urge incontinence, non-obstructive urinary retention, or fecal incontinence. It is not FDA-approved as a treatment for pain. The goal of this therapy is at least a 50% improvement in symptoms. It is NOT realistic to expect a 100% cure. This is a a 2-step outpatient procedure. After a successful test period, a permanent wire and generator are  placed in the OR. We discussed the risk of infection. We reviewed the fact that about 30% of patients fail the test phase and are not candidates for permanent generator placement. During the 1-2 week trial phase, symptoms are documented by the patient to determine response. If patient gets at least a 50% improvement in symptoms, they may then proceed with Step 2. Step 1: Trial lead placement. Per physician discretion, may done one of two ways: Percutaneous nerve evaluation (PNE) in the [facility] urology office. Performed by urologist under local anesthesia (numbing the area with lidocaine) using a spinal needle for placement of test wire, which usually stays in place for 5-7 days to determine therapy response. Test lead placement in OR under anesthesia. Usually stays in place 2 weeks to determine therapy response. > Step 2: Permanent implantation of sacral neuromodulation device, which is performed in the OR.  Sacral neuromodulation implants: All are conditionally MRI safe. Manufacturer: Medtronic Website:  [email or web address]-[email or web address] Options: lnterStim X: Non-rechargeable. The battery lasts 10 years on average. lnterStim Micro: Rechargeable. The battery lasts 15 years on average and must be charged routinely. Approximately 50% smaller implant than lnterStim X implant.  Manufacturer: Axonics Website: Findrealrelief.axonics.com Options: Non-rechargeable (Axonics F15): The battery lasts 15 years on average. Rechargeable (Axonics R20): The battery lasts 20 years on average and must be charged in office for about 1 hour every 6-10 months on average. Approximately 50% smaller implant than Axonics non-rechargeable implant.  Note: Generally the rechargeable devices are only advised for very small or thin patients who may not have sufficient adipose tissue to comfortably overlay the implanted device.  Suprapubic catheter (SP tube) placement. Only done in severely refractory OAB when all other options have failed or are not a viable treatment choice depending on patient factors. Involves placement of a catheter through the lower abdomen into the bladder to continuously drain the bladder into an external collection bag, which patient can then empty at their convenience every few hours. Done via an outpatient surgical procedure in the OR under anesthesia. Risks may included but are not limited to: surgical site pain, infections, skin irritation / breakdown, chronic bacteriuria, symptomatic UTls. The SP tube must stay in place continuously. This is a reversible procedure however - the insertion site will close if catheter is removed for more than a few hours. The SP tube must be exchanged routinely every 4 weeks to prevent the catheter from becoming clogged with sediment. SP tube exchanges are typically performed at a urology nurse visit or by a home health nurse.

## 2023-04-14 ENCOUNTER — Other Ambulatory Visit: Payer: Self-pay | Admitting: Cardiology

## 2023-04-17 ENCOUNTER — Encounter (INDEPENDENT_AMBULATORY_CARE_PROVIDER_SITE_OTHER): Payer: 59 | Admitting: Ophthalmology

## 2023-04-17 DIAGNOSIS — H35372 Puckering of macula, left eye: Secondary | ICD-10-CM

## 2023-04-17 DIAGNOSIS — I10 Essential (primary) hypertension: Secondary | ICD-10-CM

## 2023-04-17 DIAGNOSIS — H35033 Hypertensive retinopathy, bilateral: Secondary | ICD-10-CM | POA: Diagnosis not present

## 2023-04-17 DIAGNOSIS — H4423 Degenerative myopia, bilateral: Secondary | ICD-10-CM | POA: Diagnosis not present

## 2023-04-17 DIAGNOSIS — H43813 Vitreous degeneration, bilateral: Secondary | ICD-10-CM

## 2023-04-26 DIAGNOSIS — J441 Chronic obstructive pulmonary disease with (acute) exacerbation: Secondary | ICD-10-CM | POA: Diagnosis not present

## 2023-04-30 ENCOUNTER — Ambulatory Visit: Payer: 59 | Admitting: Podiatry

## 2023-05-02 DIAGNOSIS — N390 Urinary tract infection, site not specified: Secondary | ICD-10-CM | POA: Diagnosis not present

## 2023-05-06 DIAGNOSIS — N182 Chronic kidney disease, stage 2 (mild): Secondary | ICD-10-CM | POA: Diagnosis not present

## 2023-05-06 DIAGNOSIS — N390 Urinary tract infection, site not specified: Secondary | ICD-10-CM | POA: Diagnosis not present

## 2023-05-08 DIAGNOSIS — G3184 Mild cognitive impairment, so stated: Secondary | ICD-10-CM | POA: Diagnosis not present

## 2023-05-08 DIAGNOSIS — Z79899 Other long term (current) drug therapy: Secondary | ICD-10-CM | POA: Diagnosis not present

## 2023-05-27 DIAGNOSIS — J441 Chronic obstructive pulmonary disease with (acute) exacerbation: Secondary | ICD-10-CM | POA: Diagnosis not present

## 2023-05-30 DIAGNOSIS — R059 Cough, unspecified: Secondary | ICD-10-CM | POA: Diagnosis not present

## 2023-05-30 DIAGNOSIS — J00 Acute nasopharyngitis [common cold]: Secondary | ICD-10-CM | POA: Diagnosis not present

## 2023-06-03 ENCOUNTER — Encounter: Payer: Self-pay | Admitting: Obstetrics & Gynecology

## 2023-06-03 ENCOUNTER — Ambulatory Visit: Payer: 59 | Admitting: Obstetrics & Gynecology

## 2023-06-03 VITALS — BP 86/59 | HR 78 | Wt 188.0 lb

## 2023-06-03 DIAGNOSIS — Z01419 Encounter for gynecological examination (general) (routine) without abnormal findings: Secondary | ICD-10-CM

## 2023-06-03 DIAGNOSIS — Z0000 Encounter for general adult medical examination without abnormal findings: Secondary | ICD-10-CM | POA: Diagnosis not present

## 2023-06-03 DIAGNOSIS — R051 Acute cough: Secondary | ICD-10-CM | POA: Diagnosis not present

## 2023-06-05 DIAGNOSIS — Z79899 Other long term (current) drug therapy: Secondary | ICD-10-CM | POA: Diagnosis not present

## 2023-06-05 DIAGNOSIS — G3184 Mild cognitive impairment, so stated: Secondary | ICD-10-CM | POA: Diagnosis not present

## 2023-06-06 DIAGNOSIS — Z0000 Encounter for general adult medical examination without abnormal findings: Secondary | ICD-10-CM | POA: Diagnosis not present

## 2023-06-06 DIAGNOSIS — I10 Essential (primary) hypertension: Secondary | ICD-10-CM | POA: Diagnosis not present

## 2023-06-06 DIAGNOSIS — R2681 Unsteadiness on feet: Secondary | ICD-10-CM | POA: Diagnosis not present

## 2023-06-11 DIAGNOSIS — I10 Essential (primary) hypertension: Secondary | ICD-10-CM | POA: Diagnosis not present

## 2023-06-11 DIAGNOSIS — M810 Age-related osteoporosis without current pathological fracture: Secondary | ICD-10-CM | POA: Diagnosis not present

## 2023-06-11 DIAGNOSIS — E039 Hypothyroidism, unspecified: Secondary | ICD-10-CM | POA: Diagnosis not present

## 2023-06-11 DIAGNOSIS — R7303 Prediabetes: Secondary | ICD-10-CM | POA: Diagnosis not present

## 2023-06-12 DIAGNOSIS — N390 Urinary tract infection, site not specified: Secondary | ICD-10-CM | POA: Diagnosis not present

## 2023-06-12 DIAGNOSIS — N182 Chronic kidney disease, stage 2 (mild): Secondary | ICD-10-CM | POA: Diagnosis not present

## 2023-06-14 DIAGNOSIS — N390 Urinary tract infection, site not specified: Secondary | ICD-10-CM | POA: Diagnosis not present

## 2023-06-20 DIAGNOSIS — N6320 Unspecified lump in the left breast, unspecified quadrant: Secondary | ICD-10-CM | POA: Diagnosis not present

## 2023-06-20 DIAGNOSIS — R0989 Other specified symptoms and signs involving the circulatory and respiratory systems: Secondary | ICD-10-CM | POA: Diagnosis not present

## 2023-06-23 NOTE — Progress Notes (Signed)
 Chief Complaint  Patient presents with   "something dropping"      83 y.o. No obstetric history on file. No LMP recorded. Patient is postmenopausal. The current method of family planning is post menopausal status.  Outpatient Encounter Medications as of 06/03/2023  Medication Sig   amantadine (SYMMETREL) 100 MG capsule Take 100 mg by mouth 2 (two) times daily.   amoxicillin (AMOXIL) 500 MG capsule Take 500 mg by mouth 2 (two) times daily.   BREO ELLIPTA 200-25 MCG/INH AEPB Inhale 1 puff into the lungs daily.   Calcium Carb-Cholecalciferol (CALCIUM 600+D3 PO) Take 1 tablet by mouth daily.   estradiol (ESTRACE) 0.1 MG/GM vaginal cream Discard plastic applicator. Insert a blueberry size amount (approximately 1 gram) of cream on fingertip inside vagina at bedtime every night for 1 week then every other night. For long term use.   fluticasone (FLONASE) 50 MCG/ACT nasal spray Place 2 sprays into both nostrils daily.   furosemide (LASIX) 20 MG tablet Take 1 tablet (20 mg total) by mouth daily.   ibuprofen (ADVIL) 400 MG tablet Take 400 mg by mouth every 8 (eight) hours as needed for cramping, moderate pain or fever.   loperamide (IMODIUM) 2 MG capsule Take 2 mg by mouth as needed for diarrhea or loose stools.   melatonin 3 MG TABS tablet Take 3 mg by mouth at bedtime.   omeprazole (PRILOSEC) 20 MG capsule Take 1 capsule (20 mg total) by mouth daily. (Patient taking differently: Take 20 mg by mouth 2 (two) times daily before a meal.)   risperiDONE (RISPERDAL) 1 MG tablet Take 1 tablet (1 mg total) by mouth at bedtime.   cetirizine (ZYRTEC) 10 MG tablet Take 10 mg by mouth daily. (Patient not taking: Reported on 06/03/2023)   dapagliflozin propanediol (FARXIGA) 10 MG TABS tablet Take 1 tablet (10 mg total) by mouth daily before breakfast. (Patient not taking: Reported on 06/03/2023)   mirtazapine (REMERON) 7.5 MG tablet Take 1 tablet (7.5 mg total) by mouth at bedtime. (Patient not taking:  Reported on 06/03/2023)   sacubitril-valsartan (ENTRESTO) 24-26 MG Take 1 tablet by mouth 2 (two) times daily. (Patient not taking: Reported on 06/03/2023)   spironolactone (ALDACTONE) 25 MG tablet TAKE 1/2 TABLET BY MOUTH DAILY (Patient not taking: Reported on 06/03/2023)   No facility-administered encounter medications on file as of 06/03/2023.    Subjective Pt brought in from her SNF for possible prolapse relaxation Past Medical History:  Diagnosis Date   Anemia 1990   hx of   Anxiety    Arthritis 2004   Asthma 1995   uses inhaler   CHF (congestive heart failure) (HCC)    Chronic HFrEF (heart failure with reduced ejection fraction) (HCC)    a. 07/2021 Echo: EF 25%; b. 01/2022 Echo: EF 30-35%, glob HK, GrI DD, nl RV fxn, mild-mod MR; c. 02/2022 cMRI: EF 23%.   CKD (chronic kidney disease), stage III (HCC) 2010   a. Solitary kidney - s/p L nephrectomy in 2010.   Colon polyp    Complication of anesthesia 1966   first surgery she had- had a hard time waking up from surgery   COPD (chronic obstructive pulmonary disease) (HCC)    Delusional disorder (HCC)    Six delusional disorder   Diverticulosis 2003   GERD (gastroesophageal reflux disease)    on meds   Hypertension    NICM (nonischemic cardiomyopathy) (HCC)    a. 07/2021 Echo: EF 25%; b. 11/2021 Lexi PET/CT:  EF 28% (35% w/ stress). Cor Ca2+. No ischemia; c.01/2022 Echo: EF 30-35%, glob HK, GrI DD, nl RV fxn, mild-mod MR; d. 02/2022 cMRI: EF 23%, diff HK, septal-lateral dyssynchrony w/ LBBB. Nonspecific RV insertion site LGE, borderline elev extracellular volume percentage sugg some replacement fibrosis-->NICM.   Schizophrenia (HCC)    Severe left ventricular systolic dysfunction (LVSD)    EF 23% by cardiac MRI    Past Surgical History:  Procedure Laterality Date   APPENDECTOMY  1966   CATARACT EXTRACTION     Bilateral   CHOLECYSTECTOMY  2004   COLON SURGERY  2008   COLONOSCOPY  2013   JP-MAC-moviprep(good)-TA   COLOSTOMY      COLOSTOMY TAKEDOWN     DIAGNOSTIC LAPAROSCOPY     exploratory for endometriosis   HEEL SPUR SURGERY     Right, Metal Plate in Heel   INCISIONAL HERNIA REPAIR  2010   NEPHRECTOMY Left 2008   As complication of partial colectomy for diverticulitis   POLYPECTOMY  2013   TA   TONSILLECTOMY     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   Procedure: LEFT TOTAL KNEE ARTHROPLASTY;  Surgeon: Jene Every, MD;  Location: [facility];  Service: Orthopedics;  Laterality: Left;  Adductor Block    OB History   No obstetric history on file.     Allergies  Allergen Reactions   Haloperidol Anaphylaxis   Aripiprazole Other (See Comments)    Bad thoughts    Chlorpheniramine-Pse-Ibuprofen     Other reaction(s): Cough   Calcitriol    Simvastatin     Social History   Socioeconomic History   Marital status: Legally Separated    Spouse name: Not on file   Number of children: 10   Years of education: Not on file   Highest education level: Not on file  Occupational History   Occupation: retired  Tobacco Use   Smoking status: Never   Smokeless tobacco: Never  Vaping Use   Vaping status: Never Used  Substance and Sexual Activity   Alcohol use: Not Currently    Comment: occassionally   Drug use: No   Sexual activity: Not Currently  Other Topics Concern   Not on file  Social History Narrative   Not on file   Social Drivers of Health   Financial Resource Strain: Not on file  Food Insecurity: No Food Insecurity (04/23/2022)   Hunger Vital Sign    Worried About Running Out of Food in the Last Year: Never true    Ran Out of Food in the Last Year: Never true  Transportation Needs: No Transportation Needs (04/23/2022)   PRAPARE - Administrator, Civil Service (Medical): No    Lack of Transportation (Non-Medical): No  Physical Activity: Not on file  Stress: Not on file  Social Connections: Not on file    Family History  Problem Relation Age of Onset   Hypertension Mother     Heart failure Mother    Hyperlipidemia Mother    Stroke Mother    Hypertension Father    Heart attack Father    COPD Father    Diabetes Paternal Aunt    Diabetes Paternal Grandmother    Colon polyps Other        niece   Stomach cancer Neg Hx    Rectal cancer Neg Hx    Esophageal cancer Neg Hx    Colon cancer Neg Hx     Medications:       Current Outpatient  Medications:    amantadine (SYMMETREL) 100 MG capsule, Take 100 mg by mouth 2 (two) times daily., Disp: , Rfl:    amoxicillin (AMOXIL) 500 MG capsule, Take 500 mg by mouth 2 (two) times daily., Disp: , Rfl:    BREO ELLIPTA 200-25 MCG/INH AEPB, Inhale 1 puff into the lungs daily., Disp: , Rfl:    Calcium Carb-Cholecalciferol (CALCIUM 600+D3 PO), Take 1 tablet by mouth daily., Disp: , Rfl:    estradiol (ESTRACE) 0.1 MG/GM vaginal cream, Discard plastic applicator. Insert a blueberry size amount (approximately 1 gram) of cream on fingertip inside vagina at bedtime every night for 1 week then every other night. For long term use., Disp: 30 g, Rfl: 3   fluticasone (FLONASE) 50 MCG/ACT nasal spray, Place 2 sprays into both nostrils daily., Disp: , Rfl:    furosemide (LASIX) 20 MG tablet, Take 1 tablet (20 mg total) by mouth daily., Disp: 30 tablet, Rfl: 1   ibuprofen (ADVIL) 400 MG tablet, Take 400 mg by mouth every 8 (eight) hours as needed for cramping, moderate pain or fever., Disp: , Rfl:    loperamide (IMODIUM) 2 MG capsule, Take 2 mg by mouth as needed for diarrhea or loose stools., Disp: , Rfl:    melatonin 3 MG TABS tablet, Take 3 mg by mouth at bedtime., Disp: , Rfl:    omeprazole (PRILOSEC) 20 MG capsule, Take 1 capsule (20 mg total) by mouth daily. (Patient taking differently: Take 20 mg by mouth 2 (two) times daily before a meal.), Disp: 30 capsule, Rfl: 0   risperiDONE (RISPERDAL) 1 MG tablet, Take 1 tablet (1 mg total) by mouth at bedtime., Disp: 30 tablet, Rfl: 1   cetirizine (ZYRTEC) 10 MG tablet, Take 10 mg by mouth  daily. (Patient not taking: Reported on 06/03/2023), Disp: , Rfl:    dapagliflozin propanediol (FARXIGA) 10 MG TABS tablet, Take 1 tablet (10 mg total) by mouth daily before breakfast. (Patient not taking: Reported on 06/03/2023), Disp: 90 tablet, Rfl: 3   mirtazapine (REMERON) 7.5 MG tablet, Take 1 tablet (7.5 mg total) by mouth at bedtime. (Patient not taking: Reported on 06/03/2023), Disp: 30 tablet, Rfl: 0   sacubitril-valsartan (ENTRESTO) 24-26 MG, Take 1 tablet by mouth 2 (two) times daily. (Patient not taking: Reported on 06/03/2023), Disp: 60 tablet, Rfl: 5   spironolactone (ALDACTONE) 25 MG tablet, TAKE 1/2 TABLET BY MOUTH DAILY (Patient not taking: Reported on 06/03/2023), Disp: 45 tablet, Rfl: 3  Objective Blood pressure (!) 86/59, pulse 78, weight 188 lb (85.3 kg).  General WDWN female NAD Vulva:  normal appearing vulva with no masses, tenderness or lesions Vagina:  normal mucosa, no discharge atrophic no evidence of POP or relaxation Cervix:  Normal no lesions Uterus:  normal size, contour, position, consistency, mobility, non-tender Adnexa: ovaries:present,  normal adnexa in size, nontender and no masses   Pertinent ROS No burning with urination, frequency or urgency No nausea, vomiting or diarrhea Nor fever chills or other constitutional symptoms   Labs or studies     Impression + Management Plan: Diagnoses this Encounter::   ICD-10-CM   1. Encntr for gyn exam (general) (routine) w/o abn findings  Z01.419         Medications prescribed during  this encounter: No orders of the defined types were placed in this encounter.   Labs or Scans Ordered during this encounter: No orders of the defined types were placed in this encounter.     Follow up No follow-ups on file.

## 2023-06-24 ENCOUNTER — Encounter: Payer: Self-pay | Admitting: Family Medicine

## 2023-06-24 DIAGNOSIS — J441 Chronic obstructive pulmonary disease with (acute) exacerbation: Secondary | ICD-10-CM | POA: Diagnosis not present

## 2023-06-24 NOTE — Progress Notes (Deleted)
 Name: [name] DOB: 1940/09/07 MRN: [id number]  History of Present Illness: [name] is a 83 y.o. female who presents today for follow up visit at [facility]. She is accompanied by ***. - GU history: 1. Solitary right kidney s/p left nephrectomy in 2010. - Per note by Dr. Heron Nay on 07/07/2015: Underwent "several complicated surgeries at [facility] for diverticulitis that ultimately resulted in a left nephrectomy, temporary colostomy, and abdominal wall hernia repairs with mesh".  - Renal function appears stable. 2. OAB with urinary frequency, nocturia, urgency, and urge incontinence. - Likely exacerbated by her diuretic medications and glucosuria (due to [place]). - Previously took Toviaz 4 mg daily. Not a safe candidate for anticholinergic medications at this point based on age, comorbidities (including pre-existing dry mouth), and potential side effects.  3. Recurrent UTls.  4. Small left anterior bladder diverticulum. Per CT stone on 02/01/2022. 5. Prior episode of  urinary retention.  At last visit on 03/27/2023: > Asymptomatic for UTI with abnormal UA. We discussed asymptomatic bacteriuria which does not warrant urine culture or antibiotic treatment; discussed antibiotic stewardship with goal to minimize risk for developing antibiotic resistance.  > PVR: 20 ml > The plan was: 1. For OAB: Minimize caffeine intake. 2. For UTI management / prevention: - Start topical vaginal estrogen cream as prescribed. - Do not treat with antibiotics regardless of abnormal urinalysis results unless patient is also have new acute UTI symptoms. If uncertain, with patient's consent the SNF staff may perform I&O straight cath to collect sterile urine specimen for urine culture. - Maintain adequate fluid intake daily to flush out the urinary tract. - Consider OTC supplements for UTI prevention. 3. Return in about 3 months (around 06/25/2023) for UA, PVR, & f/u with  Elizabeth Georges NP.  Since last visit: ***  Today: She reports *** symptomatic UTIs with positive urine cultures since last visit.  ***no urine testing results since last office visit here found in EMR per chart review  She {Actions; denies-reports:120008} working on minimizing her caffeine intake and {Actions; denies-reports:120008}improvement of her OAB symptoms with that.  She reports {Blank multiple:19197::"improved","persistent / unchanged"} urinary ***frequency, ***nocturia, ***urgency, and ***urge incontinence. Voiding ***x/day and ***x/night on average. Leaking ***x/day on average; using *** ***pads / ***diapers per day on average.  She {Actions; denies-reports:120008} dysuria, gross hematuria, straining to void, or sensations of incomplete emptying.  She {HAS HAS CBJ:62831} been using vaginal estrogen cream at a frequency of *** time(s) per week. She {Actions; denies-reports:120008} vaginal pain, bleeding, abnormal discharge, itching, dryness.   Medications: Current Outpatient Medications  Medication Sig Dispense Refill   amantadine (SYMMETREL) 100 MG capsule Take 100 mg by mouth 2 (two) times daily.     amoxicillin (AMOXIL) 500 MG capsule Take 500 mg by mouth 2 (two) times daily.     BREO ELLIPTA 200-25 MCG/INH AEPB Inhale 1 puff into the lungs daily.     Calcium Carb-Cholecalciferol (CALCIUM 600+D3 PO) Take 1 tablet by mouth daily.     cetirizine (ZYRTEC) 10 MG tablet Take 10 mg by mouth daily. (Patient not taking: Reported on 06/03/2023)     dapagliflozin propanediol (FARXIGA) 10 MG TABS tablet Take 1 tablet (10 mg total) by mouth daily before breakfast. (Patient not taking: Reported on 06/03/2023) 90 tablet 3   estradiol (ESTRACE) 0.1 MG/GM vaginal cream Discard plastic applicator. Insert a blueberry size amount (approximately 1 gram) of cream on fingertip inside vagina at bedtime every night for 1 week then  every other night. For long term use. 30 g 3   fluticasone (FLONASE) 50  MCG/ACT nasal spray Place 2 sprays into both nostrils daily.     furosemide (LASIX) 20 MG tablet Take 1 tablet (20 mg total) by mouth daily. 30 tablet 1   ibuprofen (ADVIL) 400 MG tablet Take 400 mg by mouth every 8 (eight) hours as needed for cramping, moderate pain or fever.     loperamide (IMODIUM) 2 MG capsule Take 2 mg by mouth as needed for diarrhea or loose stools.     melatonin 3 MG TABS tablet Take 3 mg by mouth at bedtime.     mirtazapine (REMERON) 7.5 MG tablet Take 1 tablet (7.5 mg total) by mouth at bedtime. (Patient not taking: Reported on 06/03/2023) 30 tablet 0   omeprazole (PRILOSEC) 20 MG capsule Take 1 capsule (20 mg total) by mouth daily. (Patient taking differently: Take 20 mg by mouth 2 (two) times daily before a meal.) 30 capsule 0   risperiDONE (RISPERDAL) 1 MG tablet Take 1 tablet (1 mg total) by mouth at bedtime. 30 tablet 1   sacubitril-valsartan (ENTRESTO) 24-26 MG Take 1 tablet by mouth 2 (two) times daily. (Patient not taking: Reported on 06/03/2023) 60 tablet 5   spironolactone (ALDACTONE) 25 MG tablet TAKE 1/2 TABLET BY MOUTH DAILY (Patient not taking: Reported on 06/03/2023) 45 tablet 3   No current facility-administered medications for this visit.    Allergies: Allergies  Allergen Reactions   Haloperidol Anaphylaxis   Aripiprazole Other (See Comments)    Bad thoughts    Chlorpheniramine-Pse-Ibuprofen     Other reaction(s): Cough   Calcitriol    Simvastatin     Past Medical History:  Diagnosis Date   Anemia 1990   hx of   Anxiety    Arthritis 2004   Asthma 1995   uses inhaler   CHF (congestive heart failure) (HCC)    Chronic HFrEF (heart failure with reduced ejection fraction) (HCC)    a. 07/2021 Echo: EF 25%; b. 01/2022 Echo: EF 30-35%, glob HK, GrI DD, nl RV fxn, mild-mod MR; c. 02/2022 cMRI: EF 23%.   CKD (chronic kidney disease), stage III (HCC) 2010   a. Solitary kidney - s/p L nephrectomy in 2010.   Colon polyp    Complication of anesthesia  1966   first surgery she had- had a hard time waking up from surgery   COPD (chronic obstructive pulmonary disease) (HCC)    Delusional disorder (HCC)    Six delusional disorder   Diverticulosis 2003   GERD (gastroesophageal reflux disease)    on meds   Hypertension    NICM (nonischemic cardiomyopathy) (HCC)    a. 07/2021 Echo: EF 25%; b. 11/2021 Lexi PET/CT: EF 28% (35% w/ stress). Cor Ca2+. No ischemia; c.01/2022 Echo: EF 30-35%, glob HK, GrI DD, nl RV fxn, mild-mod MR; d. 02/2022 cMRI: EF 23%, diff HK, septal-lateral dyssynchrony w/ LBBB. Nonspecific RV insertion site LGE, borderline elev extracellular volume percentage sugg some replacement fibrosis-->NICM.   Schizophrenia (HCC)    Severe left ventricular systolic dysfunction (LVSD)    EF 23% by cardiac MRI   Past Surgical History:  Procedure Laterality Date   APPENDECTOMY  1966   CATARACT EXTRACTION     Bilateral   CHOLECYSTECTOMY  2004   COLON SURGERY  2008   COLONOSCOPY  2013   JP-MAC-moviprep(good)-TA   COLOSTOMY     COLOSTOMY TAKEDOWN     DIAGNOSTIC LAPAROSCOPY     exploratory  for endometriosis   HEEL SPUR SURGERY     Right, Metal Plate in Heel   INCISIONAL HERNIA REPAIR  2010   NEPHRECTOMY Left 2008   As complication of partial colectomy for diverticulitis   POLYPECTOMY  2013   TA   TONSILLECTOMY     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   Procedure: LEFT TOTAL KNEE ARTHROPLASTY;  Surgeon: Jene Every, MD;  Location: [facility];  Service: Orthopedics;  Laterality: Left;  Adductor Block   Family History  Problem Relation Age of Onset   Hypertension Mother    Heart failure Mother    Hyperlipidemia Mother    Stroke Mother    Hypertension Father    Heart attack Father    COPD Father    Diabetes Paternal Aunt    Diabetes Paternal Grandmother    Colon polyps Other        niece   Stomach cancer Neg Hx    Rectal cancer Neg Hx    Esophageal cancer Neg Hx    Colon cancer Neg Hx    Social History    Socioeconomic History   Marital status: Legally Separated    Spouse name: Not on file   Number of children: 10   Years of education: Not on file   Highest education level: Not on file  Occupational History   Occupation: retired  Tobacco Use   Smoking status: Never   Smokeless tobacco: Never  Vaping Use   Vaping status: Never Used  Substance and Sexual Activity   Alcohol use: Not Currently    Comment: occassionally   Drug use: No   Sexual activity: Not Currently  Other Topics Concern   Not on file  Social History Narrative   Not on file   Social Drivers of Health   Financial Resource Strain: Not on file  Food Insecurity: No Food Insecurity (04/23/2022)   Hunger Vital Sign    Worried About Running Out of Food in the Last Year: Never true    Ran Out of Food in the Last Year: Never true  Transportation Needs: No Transportation Needs (04/23/2022)   PRAPARE - Administrator, Civil Service (Medical): No    Lack of Transportation (Non-Medical): No  Physical Activity: Not on file  Stress: Not on file  Social Connections: Not on file  Intimate Partner Violence: Not At Risk (04/23/2022)   Humiliation, Afraid, Rape, and Kick questionnaire    Fear of Current or Ex-Partner: No    Emotionally Abused: No    Physically Abused: No    Sexually Abused: No    Review of Systems Constitutional: Patient denies any unintentional weight loss or change in strength lntegumentary: Patient denies any rashes or pruritus Cardiovascular: Patient denies chest pain or syncope Respiratory: Patient denies shortness of breath Gastrointestinal: ***Patient {Actions; denies-reports:120008} ***nausea, ***vomiting, ***constipation, ***diarrhea ***As per HPI Musculoskeletal: Patient denies muscle cramps or weakness Neurologic: Patient denies convulsions or seizures Allergic/Immunologic: Patient denies recent allergic reaction(s) Hematologic/Lymphatic: Patient denies bleeding  tendencies Endocrine: Patient denies heat/cold intolerance  GU: As per HPI.  OBJECTIVE There were no vitals filed for this visit. There is no height or weight on file to calculate BMI.  Physical Examination Constitutional: No obvious distress; patient is non-toxic appearing  Cardiovascular: No visible lower extremity edema.  Respiratory: The patient does not have audible wheezing/stridor; respirations do not appear labored  Gastrointestinal: Abdomen non-distended Musculoskeletal: Normal ROM of UEs  Skin: No obvious rashes/open sores  Neurologic: CN 2-12 grossly intact  Psychiatric: Answered questions appropriately with normal affect  Hematologic/Lymphatic/Immunologic: No obvious bruises or sites of spontaneous bleeding  UA:  ***positive for *** leukocytes, *** blood, ***nitrites ***Urine microscopy:  ***negative  *** WBC/hpf, *** RBC/hpf, *** bacteria ***with no evidence of UTI ***with no evidence of microscopic hematuria ***otherwise unremarkable ***glucosuria (secondary to ***[place] ***Farxiga use)  PVR: *** ml  ASSESSMENT No diagnosis found. ***  We agreed to plan for follow up in *** months / ***1 year or sooner if needed. Patient verbalized understanding of and agreement with current plan. All questions were answered.  PLAN Advised the following: 1. *** 2. ***No follow-ups on file.  No orders of the defined types were placed in this encounter.   It has been explained that the patient is to follow regularly with their PCP in addition to all other providers involved in their care and to follow instructions provided by these respective offices. Patient advised to contact urology clinic if any urologic-pertaining questions, concerns, new symptoms or problems arise in the interim period.  There are no Patient Instructions on file for this visit.  Electronically signed by:  Donnita Falls, FNP   06/24/23    2:28 PM

## 2023-06-25 ENCOUNTER — Encounter: Payer: Self-pay | Admitting: Urology

## 2023-06-25 ENCOUNTER — Ambulatory Visit: Payer: 59 | Admitting: Urology

## 2023-06-25 DIAGNOSIS — Z87440 Personal history of urinary (tract) infections: Secondary | ICD-10-CM

## 2023-06-25 DIAGNOSIS — Z905 Acquired absence of kidney: Secondary | ICD-10-CM

## 2023-06-25 DIAGNOSIS — N3281 Overactive bladder: Secondary | ICD-10-CM

## 2023-06-26 DIAGNOSIS — M19072 Primary osteoarthritis, left ankle and foot: Secondary | ICD-10-CM | POA: Diagnosis not present

## 2023-06-26 DIAGNOSIS — M79674 Pain in right toe(s): Secondary | ICD-10-CM | POA: Diagnosis not present

## 2023-06-26 DIAGNOSIS — L851 Acquired keratosis [keratoderma] palmaris et plantaris: Secondary | ICD-10-CM | POA: Diagnosis not present

## 2023-06-26 DIAGNOSIS — B351 Tinea unguium: Secondary | ICD-10-CM | POA: Diagnosis not present

## 2023-06-26 DIAGNOSIS — R269 Unspecified abnormalities of gait and mobility: Secondary | ICD-10-CM | POA: Diagnosis not present

## 2023-06-26 DIAGNOSIS — L608 Other nail disorders: Secondary | ICD-10-CM | POA: Diagnosis not present

## 2023-06-26 DIAGNOSIS — M79675 Pain in left toe(s): Secondary | ICD-10-CM | POA: Diagnosis not present

## 2023-07-03 DIAGNOSIS — Z79899 Other long term (current) drug therapy: Secondary | ICD-10-CM | POA: Diagnosis not present

## 2023-07-04 DIAGNOSIS — G3184 Mild cognitive impairment, so stated: Secondary | ICD-10-CM | POA: Diagnosis not present

## 2023-07-04 DIAGNOSIS — Z79899 Other long term (current) drug therapy: Secondary | ICD-10-CM | POA: Diagnosis not present

## 2023-07-12 DIAGNOSIS — E876 Hypokalemia: Secondary | ICD-10-CM | POA: Diagnosis not present

## 2023-07-12 DIAGNOSIS — R609 Edema, unspecified: Secondary | ICD-10-CM | POA: Diagnosis not present

## 2023-07-12 DIAGNOSIS — M6281 Muscle weakness (generalized): Secondary | ICD-10-CM | POA: Diagnosis not present

## 2023-07-14 ENCOUNTER — Other Ambulatory Visit: Payer: Self-pay | Admitting: Cardiology

## 2023-07-16 ENCOUNTER — Telehealth: Payer: Self-pay | Admitting: Cardiology

## 2023-07-16 NOTE — Telephone Encounter (Signed)
 Called to sched recall

## 2023-07-24 ENCOUNTER — Other Ambulatory Visit: Payer: Self-pay | Admitting: Urology

## 2023-07-24 DIAGNOSIS — Z87440 Personal history of urinary (tract) infections: Secondary | ICD-10-CM

## 2023-07-25 DIAGNOSIS — J441 Chronic obstructive pulmonary disease with (acute) exacerbation: Secondary | ICD-10-CM | POA: Diagnosis not present

## 2023-07-31 DIAGNOSIS — G3184 Mild cognitive impairment, so stated: Secondary | ICD-10-CM | POA: Diagnosis not present

## 2023-07-31 DIAGNOSIS — Z79899 Other long term (current) drug therapy: Secondary | ICD-10-CM | POA: Diagnosis not present

## 2023-08-09 DIAGNOSIS — N390 Urinary tract infection, site not specified: Secondary | ICD-10-CM | POA: Diagnosis not present

## 2023-08-09 DIAGNOSIS — N182 Chronic kidney disease, stage 2 (mild): Secondary | ICD-10-CM | POA: Diagnosis not present

## 2023-08-12 DIAGNOSIS — N390 Urinary tract infection, site not specified: Secondary | ICD-10-CM | POA: Diagnosis not present

## 2023-08-21 ENCOUNTER — Emergency Department (HOSPITAL_COMMUNITY)
Admission: EM | Admit: 2023-08-21 | Discharge: 2023-08-22 | Disposition: A | Discharge status: Home / Self Care | Attending: Emergency Medicine | Admitting: Emergency Medicine

## 2023-08-21 ENCOUNTER — Other Ambulatory Visit: Payer: Self-pay

## 2023-08-21 ENCOUNTER — Encounter (HOSPITAL_COMMUNITY): Payer: Self-pay

## 2023-08-21 ENCOUNTER — Emergency Department (HOSPITAL_COMMUNITY)

## 2023-08-21 DIAGNOSIS — Z79899 Other long term (current) drug therapy: Secondary | ICD-10-CM | POA: Insufficient documentation

## 2023-08-21 DIAGNOSIS — E871 Hypo-osmolality and hyponatremia: Secondary | ICD-10-CM | POA: Insufficient documentation

## 2023-08-21 DIAGNOSIS — N189 Chronic kidney disease, unspecified: Secondary | ICD-10-CM | POA: Insufficient documentation

## 2023-08-21 DIAGNOSIS — K7689 Other specified diseases of liver: Secondary | ICD-10-CM | POA: Diagnosis not present

## 2023-08-21 DIAGNOSIS — J45909 Unspecified asthma, uncomplicated: Secondary | ICD-10-CM | POA: Diagnosis not present

## 2023-08-21 DIAGNOSIS — M546 Pain in thoracic spine: Secondary | ICD-10-CM | POA: Diagnosis present

## 2023-08-21 DIAGNOSIS — I509 Heart failure, unspecified: Secondary | ICD-10-CM | POA: Insufficient documentation

## 2023-08-21 DIAGNOSIS — S39012A Strain of muscle, fascia and tendon of lower back, initial encounter: Secondary | ICD-10-CM | POA: Diagnosis not present

## 2023-08-21 DIAGNOSIS — X58XXXA Exposure to other specified factors, initial encounter: Secondary | ICD-10-CM | POA: Diagnosis not present

## 2023-08-21 DIAGNOSIS — K5730 Diverticulosis of large intestine without perforation or abscess without bleeding: Secondary | ICD-10-CM | POA: Diagnosis not present

## 2023-08-21 DIAGNOSIS — I130 Hypertensive heart and chronic kidney disease with heart failure and stage 1 through stage 4 chronic kidney disease, or unspecified chronic kidney disease: Secondary | ICD-10-CM | POA: Diagnosis not present

## 2023-08-21 DIAGNOSIS — Z905 Acquired absence of kidney: Secondary | ICD-10-CM | POA: Diagnosis not present

## 2023-08-21 DIAGNOSIS — Z743 Need for continuous supervision: Secondary | ICD-10-CM | POA: Diagnosis not present

## 2023-08-21 DIAGNOSIS — M549 Dorsalgia, unspecified: Secondary | ICD-10-CM | POA: Diagnosis not present

## 2023-08-21 DIAGNOSIS — R109 Unspecified abdominal pain: Secondary | ICD-10-CM | POA: Diagnosis not present

## 2023-08-21 LAB — URINALYSIS, ROUTINE W REFLEX MICROSCOPIC
Bilirubin Urine: NEGATIVE
Glucose, UA: >=500 mg/dL — AB
Hgb urine dipstick: NEGATIVE
Ketones, ur: NEGATIVE mg/dL
Leukocytes,Ua: NEGATIVE
Nitrite: NEGATIVE
Protein, ur: NEGATIVE mg/dL
Specific Gravity, Urine: 1.006 (ref 1.005–1.030)
pH: 6 (ref 5.0–8.0)

## 2023-08-21 LAB — CBC WITH DIFFERENTIAL/PLATELET
Abs Immature Granulocytes: 0.02 10*3/uL (ref 0.00–0.07)
Basophils Absolute: 0.1 10*3/uL (ref 0.0–0.1)
Basophils Relative: 1 %
Eosinophils Absolute: 0.2 10*3/uL (ref 0.0–0.5)
Eosinophils Relative: 3 %
HCT: 43.4 % (ref 36.0–46.0)
Hemoglobin: 14.4 g/dL (ref 12.0–15.0)
Immature Granulocytes: 0 %
Lymphocytes Relative: 21 %
Lymphs Abs: 1.3 10*3/uL (ref 0.7–4.0)
MCH: 30.1 pg (ref 26.0–34.0)
MCHC: 33.2 g/dL (ref 30.0–36.0)
MCV: 90.6 fL (ref 80.0–100.0)
Monocytes Absolute: 0.7 10*3/uL (ref 0.1–1.0)
Monocytes Relative: 11 %
Neutro Abs: 4 10*3/uL (ref 1.7–7.7)
Neutrophils Relative %: 64 %
Platelets: 245 10*3/uL (ref 150–400)
RBC: 4.79 MIL/uL (ref 3.87–5.11)
RDW: 13.9 % (ref 11.5–15.5)
WBC: 6.3 10*3/uL (ref 4.0–10.5)
nRBC: 0 % (ref 0.0–0.2)

## 2023-08-21 LAB — COMPREHENSIVE METABOLIC PANEL WITH GFR
ALT: 13 U/L (ref 0–44)
AST: 17 U/L (ref 15–41)
Albumin: 3.8 g/dL (ref 3.5–5.0)
Alkaline Phosphatase: 97 U/L (ref 38–126)
Anion gap: 9 (ref 5–15)
BUN: 28 mg/dL — ABNORMAL HIGH (ref 8–23)
CO2: 24 mmol/L (ref 22–32)
Calcium: 9.2 mg/dL (ref 8.9–10.3)
Chloride: 96 mmol/L — ABNORMAL LOW (ref 98–111)
Creatinine, Ser: 1.55 mg/dL — ABNORMAL HIGH (ref 0.44–1.00)
GFR, Estimated: 33 mL/min — ABNORMAL LOW (ref >60)
Glucose, Bld: 93 mg/dL (ref 70–99)
Potassium: 4.6 mmol/L (ref 3.5–5.1)
Sodium: 129 mmol/L — ABNORMAL LOW (ref 135–145)
Total Bilirubin: 0.8 mg/dL (ref 0.0–1.2)
Total Protein: 6.4 g/dL — ABNORMAL LOW (ref 6.5–8.1)

## 2023-08-21 LAB — I-STAT CHEM 8, ED
BUN: 43 mg/dL — ABNORMAL HIGH (ref 8–23)
Calcium, Ion: 1.11 mmol/L — ABNORMAL LOW (ref 1.15–1.40)
Chloride: 96 mmol/L — ABNORMAL LOW (ref 98–111)
Creatinine, Ser: 1.6 mg/dL — ABNORMAL HIGH (ref 0.44–1.00)
Glucose, Bld: 103 mg/dL — ABNORMAL HIGH (ref 70–99)
HCT: 40 % (ref 36.0–46.0)
Hemoglobin: 13.6 g/dL (ref 12.0–15.0)
Potassium: 6.5 mmol/L (ref 3.5–5.1)
Sodium: 129 mmol/L — ABNORMAL LOW (ref 135–145)
TCO2: 27 mmol/L (ref 22–32)

## 2023-08-21 LAB — LACTIC ACID, PLASMA: Lactic Acid, Venous: 0.8 mmol/L (ref 0.5–1.9)

## 2023-08-21 LAB — LIPASE, BLOOD: Lipase: 45 U/L (ref 11–51)

## 2023-08-21 MED ORDER — MORPHINE SULFATE (PF) 4 MG/ML IV SOLN
4.0000 mg | Freq: Once | INTRAVENOUS | Status: AC
  Administered 2023-08-21: 4 mg via INTRAVENOUS
  Filled 2023-08-21: qty 1

## 2023-08-21 MED ORDER — ONDANSETRON HCL 4 MG/2ML IJ SOLN
4.0000 mg | Freq: Once | INTRAMUSCULAR | Status: AC
  Administered 2023-08-21: 4 mg via INTRAVENOUS
  Filled 2023-08-21: qty 2

## 2023-08-21 MED ORDER — IOHEXOL 300 MG/ML  SOLN
75.0000 mL | Freq: Once | INTRAMUSCULAR | Status: AC | PRN
  Administered 2023-08-21: 75 mL via INTRAVENOUS

## 2023-08-21 MED ORDER — SODIUM CHLORIDE 0.9 % IV BOLUS
500.0000 mL | Freq: Once | INTRAVENOUS | Status: AC
  Administered 2023-08-21: 500 mL via INTRAVENOUS

## 2023-08-21 MED ORDER — METHOCARBAMOL 500 MG PO TABS: 500.0000 mg | ORAL_TABLET | Freq: Two times a day (BID) | ORAL | 0 refills | Status: AC | PRN

## 2023-08-21 NOTE — ED Triage Notes (Signed)
 BIB by [facility] from [facility]. C/c of chronic back pain. Dx with UTI on Wednesday of last week. Started Bactrim Thursday. Complain of 10/10 pain on right side

## 2023-08-21 NOTE — ED Provider Notes (Signed)
 [place] EMERGENCY DEPARTMENT AT [facility] Provider Note   CSN: [id number] Arrival date & time: 08/21/23  1956     History  Chief Complaint  Patient presents with   Back Pain    [name] is a 83 y.o. female.  Pt is a 83 yo female with pmhx significant for anxiety, arthritis, GERD, asthma, CKD, schizophrenia, CHF, and HTN.  Pt said she was diagnosed with UTI last week.  She started on bactrim on 5/8.  Today, she developed right upper back pain.  She said she only has a right kidney.  Pain is 10/10.  No fevers.  + nausea.       Home Medications Prior to Admission medications   Medication Sig Start Date End Date Taking? Authorizing Provider  methocarbamol  (ROBAXIN ) 500 MG tablet Take 1 tablet (500 mg total) by mouth 2 (two) times daily as needed for muscle spasms. 08/21/23  Yes Sueellen Emery, MD  amantadine  (SYMMETREL ) 100 MG capsule Take 100 mg by mouth 2 (two) times daily. 05/03/21   [provider]  amoxicillin  (AMOXIL ) 500 MG capsule Take 500 mg by mouth 2 (two) times daily. 05/31/23   [provider]  BREO ELLIPTA  200-25 MCG/INH AEPB Inhale 1 puff into the lungs daily. 06/22/20   [provider]  Calcium  Carb-Cholecalciferol  (CALCIUM  600+D3 PO) Take 1 tablet by mouth daily.    [provider]  cetirizine  (ZYRTEC ) 10 MG tablet Take 10 mg by mouth daily. Patient not taking: Reported on 06/03/2023    [provider]  estradiol  (ESTRACE ) 0.1 MG/GM vaginal cream Discard plastic applicator. Insert a blueberry size amount (About 1 g) of cream on fingertip inside vagina at bedtime X 1 week then every other night. For long term use. 07/24/23   Lauretta Ponto, FNP  FARXIGA  10 MG TABS tablet Take 1 tablet (10 mg total) by mouth daily before breakfast. 07/15/23   Darlis Eisenmenger, MD  fluticasone  (FLONASE ) 50 MCG/ACT nasal spray Place 2 sprays into both nostrils daily. 05/02/21   [provider]  furosemide  (LASIX ) 20 MG  tablet Take 1 tablet (20 mg total) by mouth daily. 03/13/22   Demaris Fillers, MD  ibuprofen  (ADVIL ) 400 MG tablet Take 400 mg by mouth every 8 (eight) hours as needed for cramping, moderate pain or fever.    [provider]  loperamide (IMODIUM) 2 MG capsule Take 2 mg by mouth as needed for diarrhea or loose stools.    [provider]  melatonin 3 MG TABS tablet Take 3 mg by mouth at bedtime.    [provider]  mirtazapine  (REMERON ) 7.5 MG tablet Take 1 tablet (7.5 mg total) by mouth at bedtime. Patient not taking: Reported on 06/03/2023 01/23/22   Cala Castleman, MD  omeprazole  (PRILOSEC) 20 MG capsule Take 1 capsule (20 mg total) by mouth daily. Patient taking differently: Take 20 mg by mouth 2 (two) times daily before a meal. 06/19/19   Palumbo, April, MD  risperiDONE  (RISPERDAL ) 1 MG tablet Take 1 tablet (1 mg total) by mouth at bedtime. 10/27/21   Johnson, Clanford L, MD  sacubitril -valsartan  (ENTRESTO ) 24-26 MG Take 1 tablet by mouth 2 (two) times daily. Patient not taking: Reported on 06/03/2023 03/18/23   Charlette Console, FNP  spironolactone  (ALDACTONE ) 25 MG tablet TAKE 1/2 TABLET BY MOUTH DAILY Patient not taking: Reported on 06/03/2023 04/15/23   Darlis Eisenmenger, MD      Allergies    Haloperidol, Aripiprazole,  Chlorpheniramine-pse-ibuprofen , Calcitriol, and Simvastatin    Review of Systems   Review of Systems  Genitourinary:  Positive for dysuria and flank pain.  All other systems reviewed and are negative.   Physical Exam Updated Vital Signs BP (!) 119/54 (BP Location: Right Arm)   Pulse 90   Temp 97.9 F (36.6 C) (Oral)   Resp 18   SpO2 92%  Physical Exam Vitals and nursing note reviewed.  Constitutional:      Appearance: Normal appearance.  HENT:     Head: Normocephalic and atraumatic.     Right Ear: External ear normal.     Left Ear: External ear normal.     Nose: Nose normal.     Mouth/Throat:     Mouth: Mucous membranes are moist.      Pharynx: Oropharynx is clear.  Eyes:     Extraocular Movements: Extraocular movements intact.     Conjunctiva/sclera: Conjunctivae normal.     Pupils: Pupils are equal, round, and reactive to light.  Cardiovascular:     Rate and Rhythm: Normal rate and regular rhythm.     Pulses: Normal pulses.     Heart sounds: Normal heart sounds.  Pulmonary:     Effort: Pulmonary effort is normal.     Breath sounds: Normal breath sounds.  Abdominal:     General: Abdomen is flat. Bowel sounds are normal.     Palpations: Abdomen is soft.     Tenderness: There is right CVA tenderness.  Musculoskeletal:        General: Normal range of motion.     Cervical back: Normal range of motion and neck supple.  Skin:    General: Skin is warm.     Capillary Refill: Capillary refill takes less than 2 seconds.  Neurological:     General: No focal deficit present.     Mental Status: She is alert and oriented to person, place, and time.  Psychiatric:        Mood and Affect: Mood normal.        Behavior: Behavior normal.     ED Results / Procedures / Treatments   Labs (all labs ordered are listed, but only abnormal results are displayed) Labs Reviewed  COMPREHENSIVE METABOLIC PANEL WITH GFR - Abnormal; Notable for the following components:      Result Value   Sodium 129 (*)    Chloride 96 (*)    BUN 28 (*)    Creatinine, Ser 1.55 (*)    Total Protein 6.4 (*)    GFR, Estimated 33 (*)    All other components within normal limits  URINALYSIS, ROUTINE W REFLEX MICROSCOPIC - Abnormal; Notable for the following components:   Color, Urine STRAW (*)    Glucose, UA >=500 (*)    Bacteria, UA RARE (*)    All other components within normal limits  I-STAT CHEM 8, ED - Abnormal; Notable for the following components:   Sodium 129 (*)    Potassium 6.5 (*)    Chloride 96 (*)    BUN 43 (*)    Creatinine, Ser 1.60 (*)    Glucose, Bld 103 (*)    Calcium , Ion 1.11 (*)    All other components within normal limits   URINE CULTURE  CULTURE, BLOOD (ROUTINE X 2)  CULTURE, BLOOD (ROUTINE X 2)  CBC WITH DIFFERENTIAL/PLATELET  LIPASE, BLOOD  LACTIC ACID, PLASMA  LACTIC ACID, PLASMA    EKG None  Radiology CT ABDOMEN PELVIS W CONTRAST Result Date:  08/21/2023 CLINICAL DATA:  Chronic back pain, recent urinary tract infection, right-sided pain EXAM: CT ABDOMEN AND PELVIS WITH CONTRAST TECHNIQUE: Multidetector CT imaging of the abdomen and pelvis was performed using the standard protocol following bolus administration of intravenous contrast. RADIATION DOSE REDUCTION: This exam was performed according to the departmental dose-optimization program which includes automated exposure control, adjustment of the mA and/or kV according to patient size and/or use of iterative reconstruction technique. CONTRAST:  75mL OMNIPAQUE  IOHEXOL  300 MG/ML  SOLN COMPARISON:  02/01/2022 FINDINGS: Lower chest: The heart is enlarged without pericardial effusion. No acute pleural or parenchymal lung disease. Hepatobiliary: Multiple hepatic cysts are again noted. No biliary duct dilation. Prior cholecystectomy. Pancreas: Unremarkable. No pancreatic ductal dilatation or surrounding inflammatory changes. Spleen: Normal in size without focal abnormality. Adrenals/Urinary Tract: Prior left nephrectomy. Right kidney enhances normally. No evidence of urinary tract calculi or obstructive uropathy. The adrenals are stable. Bladder is unremarkable. Stomach/Bowel: No bowel obstruction or ileus. Colonic diverticulosis without evidence of acute diverticulitis. No bowel wall thickening or inflammatory change. Vascular/Lymphatic: Aortic atherosclerosis. No enlarged abdominal or pelvic lymph nodes. Reproductive: Uterus and bilateral adnexa are unremarkable. Other: No free fluid or free intraperitoneal gas. No abdominal wall hernia. Musculoskeletal: No acute or destructive bony abnormalities. Reconstructed images demonstrate no additional findings. IMPRESSION: 1.  No acute intra-abdominal or intrapelvic process. 2. Colonic diverticulosis without diverticulitis. 3.  Aortic Atherosclerosis (ICD10-I70.0). Electronically Signed   By: Bobbye Burrow M.D.   On: 08/21/2023 23:04    Procedures Procedures    Medications Ordered in ED Medications  sodium chloride  0.9 % bolus 500 mL (500 mLs Intravenous New Bag/Given 08/21/23 2041)  morphine  (PF) 4 MG/ML injection 4 mg (4 mg Intravenous Given 08/21/23 2038)  ondansetron  (ZOFRAN ) injection 4 mg (4 mg Intravenous Given 08/21/23 2041)  iohexol  (OMNIPAQUE ) 300 MG/ML solution 75 mL (75 mLs Intravenous Contrast Given 08/21/23 2243)    ED Course/ Medical Decision Making/ A&P                                 Medical Decision Making Amount and/or Complexity of Data Reviewed Labs: ordered. Radiology: ordered.  Risk Prescription drug management.   This patient presents to the ED for concern of back pain, this involves an extensive number of treatment options, and is a complaint that carries with it a high risk of complications and morbidity.  The differential diagnosis includes msk, pyelo   Co morbidities that complicate the patient evaluation  anxiety, arthritis, GERD, asthma, CKD, schizophrenia, CHF, and HTN.   Additional history obtained:  Additional history obtained from epic chart review External records from outside source obtained and reviewed including EMS report   Lab Tests:  I Ordered, and personally interpreted labs.  The pertinent results include:  cbc nl, ua nl, cmp with na low at 129, bun 28 and cr 1.55 (na 131 in nov), (bun 27 and cr 1.3 in nov); lactic nl, lip nl; istat chem 8 with k elevated (likely hemolyzed)   Imaging Studies ordered:  I ordered imaging studies including ct abd/pelvis  I independently visualized and interpreted imaging which showed   No acute intra-abdominal or intrapelvic process.  2. Colonic diverticulosis without diverticulitis.  3.  Aortic Atherosclerosis  (ICD10-I70.0).   I agree with the radiologist interpretation   Cardiac Monitoring:  The patient was maintained on a cardiac monitor.  I personally viewed and interpreted the cardiac monitored which showed an underlying  rhythm of: nsr   Medicines ordered and prescription drug management:  I ordered medication including ivfs/morphine /zofran   for sx  Reevaluation of the patient after these medicines showed that the patient improved I have reviewed the patients home medicines and have made adjustments as needed   Test Considered:  ct   Critical Interventions:  Pain control   Problem List / ED Course:  Back pain:  likely msk.  Urine neg.  Ct neg.  Hyponatremia:  slightly lower than November when it was 131.  Pt will need a repeat bmp in 1 week.   Reevaluation:  After the interventions noted above, I reevaluated the patient and found that they have :improved   Social Determinants of Health:  Lives in facility   Dispostion:  After consideration of the diagnostic results and the patients response to treatment, I feel that the patent would benefit from discharge with outpatient f/u.          Final Clinical Impression(s) / ED Diagnoses Final diagnoses:  Strain of lumbar region, initial encounter  Hyponatremia    Rx / DC Orders ED Discharge Orders          Ordered    methocarbamol  (ROBAXIN ) 500 MG tablet  2 times daily PRN        08/21/23 2321              Sueellen Emery, MD 08/21/23 2324

## 2023-08-21 NOTE — ED Notes (Signed)
 Legal guardian updated on pt status

## 2023-08-21 NOTE — Discharge Instructions (Addendum)
 Recheck bmp in 1 week

## 2023-08-22 DIAGNOSIS — M5451 Vertebrogenic low back pain: Secondary | ICD-10-CM | POA: Diagnosis not present

## 2023-08-22 DIAGNOSIS — E871 Hypo-osmolality and hyponatremia: Secondary | ICD-10-CM | POA: Diagnosis not present

## 2023-08-22 DIAGNOSIS — M456 Ankylosing spondylitis lumbar region: Secondary | ICD-10-CM | POA: Diagnosis not present

## 2023-08-22 DIAGNOSIS — M545 Low back pain, unspecified: Secondary | ICD-10-CM | POA: Diagnosis not present

## 2023-08-22 DIAGNOSIS — S39012A Strain of muscle, fascia and tendon of lower back, initial encounter: Secondary | ICD-10-CM | POA: Diagnosis not present

## 2023-08-22 LAB — LACTIC ACID, PLASMA: Lactic Acid, Venous: 0.6 mmol/L (ref 0.5–1.9)

## 2023-08-22 NOTE — ED Notes (Signed)
 [facility] contacted to take pt back to the facility. Contact said to give them 30 minutes to get the pt.

## 2023-08-23 LAB — URINE CULTURE

## 2023-08-24 DIAGNOSIS — J441 Chronic obstructive pulmonary disease with (acute) exacerbation: Secondary | ICD-10-CM | POA: Diagnosis not present

## 2023-08-26 LAB — CULTURE, BLOOD (ROUTINE X 2)
Culture: NO GROWTH
Culture: NO GROWTH
Special Requests: ADEQUATE
Special Requests: ADEQUATE

## 2023-08-30 DIAGNOSIS — E871 Hypo-osmolality and hyponatremia: Secondary | ICD-10-CM | POA: Diagnosis not present

## 2023-09-04 DIAGNOSIS — Z79899 Other long term (current) drug therapy: Secondary | ICD-10-CM | POA: Diagnosis not present

## 2023-09-04 DIAGNOSIS — L603 Nail dystrophy: Secondary | ICD-10-CM | POA: Diagnosis not present

## 2023-09-04 DIAGNOSIS — M79674 Pain in right toe(s): Secondary | ICD-10-CM | POA: Diagnosis not present

## 2023-09-04 DIAGNOSIS — R2681 Unsteadiness on feet: Secondary | ICD-10-CM | POA: Diagnosis not present

## 2023-09-04 DIAGNOSIS — G3184 Mild cognitive impairment, so stated: Secondary | ICD-10-CM | POA: Diagnosis not present

## 2023-09-04 DIAGNOSIS — B351 Tinea unguium: Secondary | ICD-10-CM | POA: Diagnosis not present

## 2023-09-04 DIAGNOSIS — M79675 Pain in left toe(s): Secondary | ICD-10-CM | POA: Diagnosis not present

## 2023-09-04 DIAGNOSIS — L84 Corns and callosities: Secondary | ICD-10-CM | POA: Diagnosis not present

## 2023-09-11 ENCOUNTER — Other Ambulatory Visit: Payer: Self-pay | Admitting: Family

## 2023-09-12 DIAGNOSIS — M545 Low back pain, unspecified: Secondary | ICD-10-CM | POA: Diagnosis not present

## 2023-09-12 DIAGNOSIS — M47816 Spondylosis without myelopathy or radiculopathy, lumbar region: Secondary | ICD-10-CM | POA: Diagnosis not present

## 2023-09-15 DIAGNOSIS — M545 Low back pain, unspecified: Secondary | ICD-10-CM | POA: Diagnosis not present

## 2023-09-15 DIAGNOSIS — N182 Chronic kidney disease, stage 2 (mild): Secondary | ICD-10-CM | POA: Diagnosis not present

## 2023-09-18 DIAGNOSIS — N390 Urinary tract infection, site not specified: Secondary | ICD-10-CM | POA: Diagnosis not present

## 2023-09-19 DIAGNOSIS — E039 Hypothyroidism, unspecified: Secondary | ICD-10-CM | POA: Diagnosis not present

## 2023-09-24 DIAGNOSIS — J441 Chronic obstructive pulmonary disease with (acute) exacerbation: Secondary | ICD-10-CM | POA: Diagnosis not present

## 2023-10-02 DIAGNOSIS — Z79899 Other long term (current) drug therapy: Secondary | ICD-10-CM | POA: Diagnosis not present

## 2023-10-02 DIAGNOSIS — G3184 Mild cognitive impairment, so stated: Secondary | ICD-10-CM | POA: Diagnosis not present

## 2023-10-03 DIAGNOSIS — E039 Hypothyroidism, unspecified: Secondary | ICD-10-CM | POA: Diagnosis not present

## 2023-10-16 ENCOUNTER — Other Ambulatory Visit: Payer: Self-pay | Admitting: Urology

## 2023-10-16 DIAGNOSIS — Z87440 Personal history of urinary (tract) infections: Secondary | ICD-10-CM

## 2023-10-16 DIAGNOSIS — G3184 Mild cognitive impairment, so stated: Secondary | ICD-10-CM | POA: Diagnosis not present

## 2023-10-22 DIAGNOSIS — G3184 Mild cognitive impairment, so stated: Secondary | ICD-10-CM | POA: Diagnosis not present

## 2023-10-24 DIAGNOSIS — J441 Chronic obstructive pulmonary disease with (acute) exacerbation: Secondary | ICD-10-CM | POA: Diagnosis not present

## 2023-10-30 ENCOUNTER — Ambulatory Visit (HOSPITAL_COMMUNITY): Payer: Self-pay | Admitting: Cardiology

## 2023-10-30 ENCOUNTER — Ambulatory Visit (HOSPITAL_COMMUNITY)
Admission: RE | Admit: 2023-10-30 | Discharge: 2023-10-30 | Disposition: A | Discharge status: Home / Self Care | Source: Ambulatory Visit | Attending: Cardiology | Admitting: Cardiology

## 2023-10-30 VITALS — BP 122/64 | HR 83 | Wt 196.2 lb

## 2023-10-30 DIAGNOSIS — N183 Chronic kidney disease, stage 3 unspecified: Secondary | ICD-10-CM | POA: Insufficient documentation

## 2023-10-30 DIAGNOSIS — F209 Schizophrenia, unspecified: Secondary | ICD-10-CM | POA: Insufficient documentation

## 2023-10-30 DIAGNOSIS — R9431 Abnormal electrocardiogram [ECG] [EKG]: Secondary | ICD-10-CM | POA: Diagnosis not present

## 2023-10-30 DIAGNOSIS — I428 Other cardiomyopathies: Secondary | ICD-10-CM | POA: Diagnosis not present

## 2023-10-30 DIAGNOSIS — G3184 Mild cognitive impairment, so stated: Secondary | ICD-10-CM | POA: Diagnosis not present

## 2023-10-30 DIAGNOSIS — Z79899 Other long term (current) drug therapy: Secondary | ICD-10-CM | POA: Diagnosis not present

## 2023-10-30 DIAGNOSIS — I5022 Chronic systolic (congestive) heart failure: Secondary | ICD-10-CM | POA: Insufficient documentation

## 2023-10-30 LAB — BRAIN NATRIURETIC PEPTIDE: B Natriuretic Peptide: 279.8 pg/mL — ABNORMAL HIGH (ref 0.0–100.0)

## 2023-10-30 LAB — BASIC METABOLIC PANEL WITH GFR
Anion gap: 12 (ref 5–15)
BUN: 27 mg/dL — ABNORMAL HIGH (ref 8–23)
CO2: 23 mmol/L (ref 22–32)
Calcium: 9 mg/dL (ref 8.9–10.3)
Chloride: 97 mmol/L — ABNORMAL LOW (ref 98–111)
Creatinine, Ser: 1.31 mg/dL — ABNORMAL HIGH (ref 0.44–1.00)
GFR, Estimated: 41 mL/min — ABNORMAL LOW (ref >60)
Glucose, Bld: 104 mg/dL — ABNORMAL HIGH (ref 70–99)
Potassium: 4.9 mmol/L (ref 3.5–5.1)
Sodium: 132 mmol/L — ABNORMAL LOW (ref 135–145)

## 2023-10-30 MED ORDER — CARVEDILOL 3.125 MG PO TABS: 3.1250 mg | ORAL_TABLET | Freq: Two times a day (BID) | ORAL | 3 refills | Status: DC

## 2023-10-30 NOTE — Patient Instructions (Signed)
 Good to see you today!  START Coreg  3.125 mg Twice daily  Labs done today, your results will be available in MyChart, we will contact you for abnormal readings.  Your physician has requested that you have an echocardiogram. Echocardiography is a painless test that uses sound waves to create images of your heart. It provides your doctor with information about the size and shape of your heart and how well your heart's chambers and valves are working. This procedure takes approximately one hour. There are no restrictions for this procedure. Please do NOT wear cologne, perfume, aftershave, or lotions (deodorant is allowed). Please arrive 15 minutes prior to your appointment time.  Please note: We ask at that you not bring children with you during ultrasound (echo/ vascular) testing. Due to room size and safety concerns, children are not allowed in the ultrasound rooms during exams. Our front office staff cannot provide observation of children in our lobby area while testing is being conducted. An adult accompanying a patient to their appointment will only be allowed in the ultrasound room at the discretion of the ultrasound technician under special circumstances. We apologize for any inconvenience.  Your physician recommends that you schedule a follow-up appointment in: as scheduled   If you have any questions or concerns before your next appointment please send us  a message through [place] or call our office at [phone].    TO LEAVE A MESSAGE FOR THE NURSE SELECT OPTION 2, PLEASE LEAVE A MESSAGE INCLUDING: YOUR NAME DATE OF BIRTH CALL BACK NUMBER REASON FOR CALL**this is important as we prioritize the call backs  YOU WILL RECEIVE A CALL BACK THE SAME DAY AS LONG AS YOU CALL BEFORE 4:00 PM At the [facility], you and your health needs are our priority. As part of our continuing mission to provide you with exceptional heart care, we have created designated Provider Care Teams.  These Care Teams include your primary Cardiologist (physician) and Advanced Practice Providers (APPs- Physician Assistants and Nurse Practitioners) who all work together to provide you with the care you need, when you need it.   You may see any of the following providers on your designated Care Team at your next follow up: Dr Toribio Fuel Dr Ezra Shuck Dr. Ria Commander Dr. Morene Brownie Amy Lenetta, NP Caffie Shed, [place] [facility] [place], [place] Beckey Coe, NP [place] Lee, NP Ellouise Class, NP Tinnie Redman, PharmD Jaun Bash, PharmD   Please be sure to bring in all your medications bottles to every appointment.    Thank you for choosing [facility]

## 2023-10-31 NOTE — Progress Notes (Signed)
 [facility]  PCP: Barbaraann Harvey, FNP Cardiology: Dr. Shlomo HF Cardiology: Dr. Rolan  Chief complaint: CHF  83 y.o. with history of CKD stage 3, schizophrenia, and chronic systolic CHF was referred by Dr. Shlomo for evaluation of CHF.  She lives in an assisted living facility.  She does not smoke or drink ETOH.  Patient has been known to have a cardiomyopathy since 4/23.  Echo at that time showed EF 25%.  She had a cardiac PET done in 8/23 that showed no ischemia.  Echo in 10/23 showed that EF remained low at 30-35%, and cardiac MRI showed  LVEF 23%, moderate LV dilation, septal-lateral dyssynchrony, RVEF 46%, nonspecific RV insertion site LGE (consistent with NICM).   She returns for followup of CHF.  Weight is up about 17 lbs.  She reports doing a lot of snacking.  She walks with a walker for stability.  She can get up and down the halls at her assisted living without dyspnea.  No chest pain.  No lightheadedness or falls.  No orthopnea/PND.   Labs (02/25/23): sCr 1.3, BNP 380 (up from 263) Labs (5/25): K 4.6, creatinine 1.55  ECG (personally reviewed): NSR, LBBB 152 msec  PMH: 1. Asthma 2. CKD stage 3 3. Schizophrenia 4. H/o left nephrectomy 5. Subdural hematoma: 10/23, due to fall.  6. Chronic systolic CHF: Nonischemic cardiomyopathy.  - Echo (4/23): EF 25% - Echo (10/23): EF 30-35%, normal RV, mild-moderate MR - Cardiac PET (8/23): EF 28%, no ischemia, coronary calcifications noted.  - Cardiac MRI (11/23): LVEF 23%, moderate LV dilation, septal-lateral dyssynchrony, RVEF 46%, nonspecific RV insertion site LGE (consistent with NICM).  7. LBBB 8. COVID-19 x 3  Social History   Socioeconomic History   Marital status: Legally Separated    Spouse name: Not on file   Number of children: 10   Years of education: Not on file   Highest education level: Not on file  Occupational History   Occupation: retired  Tobacco Use   Smoking status: Never   Smokeless  tobacco: Never  Vaping Use   Vaping status: Never Used  Substance and Sexual Activity   Alcohol  use: Not Currently    Comment: occassionally   Drug use: No   Sexual activity: Not Currently  Other Topics Concern   Not on file  Social History Narrative   Not on file   Social Drivers of Health   Financial Resource Strain: Not on file  Food Insecurity: No Food Insecurity (04/23/2022)   Hunger Vital Sign    Worried About Running Out of Food in the Last Year: Never true    Ran Out of Food in the Last Year: Never true  Transportation Needs: No Transportation Needs (04/23/2022)   PRAPARE - Administrator, Civil Service (Medical): No    Lack of Transportation (Non-Medical): No  Physical Activity: Not on file  Stress: Not on file  Social Connections: Not on file  Intimate Partner Violence: Not At Risk (04/23/2022)   Humiliation, Afraid, Rape, and Kick questionnaire    Fear of Current or Ex-Partner: No    Emotionally Abused: No    Physically Abused: No    Sexually Abused: No   Family History  Problem Relation Age of Onset   Hypertension Mother    Heart failure Mother    Hyperlipidemia Mother    Stroke Mother    Hypertension Father    Heart attack Father    COPD Father    Diabetes  Paternal Aunt    Diabetes Paternal Grandmother    Colon polyps Other        niece   Stomach cancer Neg Hx    Rectal cancer Neg Hx    Esophageal cancer Neg Hx    Colon cancer Neg Hx    ROS: All systems reviewed and negative except as per HPI.   Current Outpatient Medications  Medication Sig Dispense Refill   amantadine  (SYMMETREL ) 100 MG capsule Take 100 mg by mouth 2 (two) times daily.     BREO ELLIPTA  200-25 MCG/INH AEPB Inhale 1 puff into the lungs daily.     Calcium  Carb-Cholecalciferol  (CALCIUM  600+D3 PO) Take 1 tablet by mouth daily.     carvedilol  (COREG ) 3.125 MG tablet Take 1 tablet (3.125 mg total) by mouth 2 (two) times daily. 180 tablet 3   cetirizine  (ZYRTEC ) 10 MG tablet  Take 10 mg by mouth daily.     estradiol  (ESTRACE ) 0.1 MG/GM vaginal cream Discard plastic applicator. Insert a blueberry size amount (About 1 g) of cream on fingertip inside vagina every other night. For long term use. 42.5 g 2   FARXIGA  10 MG TABS tablet Take 1 tablet (10 mg total) by mouth daily before breakfast. 90 tablet 0   fluticasone  (FLONASE ) 50 MCG/ACT nasal spray Place 2 sprays into both nostrils daily.     furosemide  (LASIX ) 20 MG tablet Take 1 tablet (20 mg total) by mouth daily. 30 tablet 1   ibuprofen  (ADVIL ) 400 MG tablet Take 400 mg by mouth every 8 (eight) hours as needed for cramping, moderate pain or fever.     loperamide (IMODIUM) 2 MG capsule Take 2 mg by mouth as needed for diarrhea or loose stools.     melatonin 3 MG TABS tablet Take 3 mg by mouth at bedtime.     methocarbamol  (ROBAXIN ) 500 MG tablet Take 1 tablet (500 mg total) by mouth 2 (two) times daily as needed for muscle spasms. 20 tablet 0   mirtazapine  (REMERON ) 7.5 MG tablet Take 1 tablet (7.5 mg total) by mouth at bedtime. 30 tablet 0   omeprazole  (PRILOSEC) 20 MG capsule Take 1 capsule (20 mg total) by mouth daily. 30 capsule 0   risperiDONE  (RISPERDAL ) 1 MG tablet Take 1 tablet (1 mg total) by mouth at bedtime. 30 tablet 1   sacubitril -valsartan  (ENTRESTO ) 24-26 MG Take 1 tablet by mouth 2 (two) times daily. 60 tablet 0   spironolactone  (ALDACTONE ) 25 MG tablet TAKE 1/2 TABLET BY MOUTH DAILY 45 tablet 3   No current facility-administered medications for this encounter.   Vitals:   10/30/23 1130  BP: 122/64  Pulse: 83  SpO2: 95%   General: NAD Neck: No JVD, no thyromegaly or thyroid  nodule.  Lungs: Clear to auscultation bilaterally with normal respiratory effort. CV: Nondisplaced PMI.  Heart regular S1/S2, no S3/S4, no murmur.  No peripheral edema.  No carotid bruit.  Normal pedal pulses.  Abdomen: Soft, nontender, no hepatosplenomegaly, no distention.  Skin: Intact without lesions or rashes.   Neurologic: Alert and oriented x 3.  Psych: Normal affect. Extremities: No clubbing or cyanosis.  HEENT: Normal.   Assessment/Plan: 1. Chronic systolic CHF:  Diagnosed in 4/23 with echo showed EF 25%.  Suspect nonischemic cardiomyopathy, cardiac PET showed no ischemia and cardiac MRI did not show evidence for MI.  No ETOH history.  Mother with HF in her 59s.  ?post-viral myocarditis, had 3 bouts with COVID-19.  Last echo in 10/23 showed EF 30-35%  withj normal RV.  Cardiac MRI in 11/23 showed LVEF 23%, moderate LV dilation, septal-lateral dyssynchrony, RVEF 46%, nonspecific RV insertion site LGE (consistent with NICM). NYHA class II symptoms.  She is not volume overloaded on exam though weight is up, think this is caloric.   - Continue Entresto  24/26 bid.  - Continue Lasix  20 mg daily, BMET/BNP today.  - Continue spironolactone  12.5 daily.  - Continue Farxiga  10 mg daily.  - Add Coreg  3.125 mg bid.  - I will arrange for repeat echo.  If EF remains < 35%, would consider her for CRT-P given wide LBBB. With age and nonischemic etiology as well as only minimal LGE on MRI, think we could avoid ICD.  2. Schizophrenia: Appropriate with conversation today.   Follow up in 3 months with APP.   I spent 32 minutes reviewing records, interviewing/examining patient, and managing orders.   Ezra Shuck 10/31/2023

## 2023-11-05 DIAGNOSIS — M79675 Pain in left toe(s): Secondary | ICD-10-CM | POA: Diagnosis not present

## 2023-11-05 DIAGNOSIS — I5023 Acute on chronic systolic (congestive) heart failure: Secondary | ICD-10-CM | POA: Diagnosis not present

## 2023-11-05 DIAGNOSIS — G20A1 Parkinson's disease without dyskinesia, without mention of fluctuations: Secondary | ICD-10-CM | POA: Diagnosis not present

## 2023-11-05 DIAGNOSIS — I5041 Acute combined systolic (congestive) and diastolic (congestive) heart failure: Secondary | ICD-10-CM | POA: Diagnosis not present

## 2023-11-05 DIAGNOSIS — R2689 Other abnormalities of gait and mobility: Secondary | ICD-10-CM | POA: Diagnosis not present

## 2023-11-05 DIAGNOSIS — L851 Acquired keratosis [keratoderma] palmaris et plantaris: Secondary | ICD-10-CM | POA: Diagnosis not present

## 2023-11-05 DIAGNOSIS — M79674 Pain in right toe(s): Secondary | ICD-10-CM | POA: Diagnosis not present

## 2023-11-05 DIAGNOSIS — B351 Tinea unguium: Secondary | ICD-10-CM | POA: Diagnosis not present

## 2023-11-05 DIAGNOSIS — L608 Other nail disorders: Secondary | ICD-10-CM | POA: Diagnosis not present

## 2023-11-05 DIAGNOSIS — M25561 Pain in right knee: Secondary | ICD-10-CM | POA: Diagnosis not present

## 2023-11-12 ENCOUNTER — Other Ambulatory Visit: Payer: Self-pay | Admitting: Cardiology

## 2023-11-21 DIAGNOSIS — M25561 Pain in right knee: Secondary | ICD-10-CM | POA: Diagnosis not present

## 2023-11-24 DIAGNOSIS — J441 Chronic obstructive pulmonary disease with (acute) exacerbation: Secondary | ICD-10-CM | POA: Diagnosis not present

## 2023-12-04 DIAGNOSIS — G3184 Mild cognitive impairment, so stated: Secondary | ICD-10-CM | POA: Diagnosis not present

## 2023-12-04 DIAGNOSIS — G8929 Other chronic pain: Secondary | ICD-10-CM | POA: Diagnosis not present

## 2023-12-04 DIAGNOSIS — M47816 Spondylosis without myelopathy or radiculopathy, lumbar region: Secondary | ICD-10-CM | POA: Diagnosis not present

## 2023-12-04 DIAGNOSIS — J449 Chronic obstructive pulmonary disease, unspecified: Secondary | ICD-10-CM | POA: Diagnosis not present

## 2023-12-04 DIAGNOSIS — D631 Anemia in chronic kidney disease: Secondary | ICD-10-CM | POA: Diagnosis not present

## 2023-12-04 DIAGNOSIS — J4520 Mild intermittent asthma, uncomplicated: Secondary | ICD-10-CM | POA: Diagnosis not present

## 2023-12-04 DIAGNOSIS — I130 Hypertensive heart and chronic kidney disease with heart failure and stage 1 through stage 4 chronic kidney disease, or unspecified chronic kidney disease: Secondary | ICD-10-CM | POA: Diagnosis not present

## 2023-12-04 DIAGNOSIS — M810 Age-related osteoporosis without current pathological fracture: Secondary | ICD-10-CM | POA: Diagnosis not present

## 2023-12-04 DIAGNOSIS — R269 Unspecified abnormalities of gait and mobility: Secondary | ICD-10-CM | POA: Diagnosis not present

## 2023-12-04 DIAGNOSIS — R7303 Prediabetes: Secondary | ICD-10-CM | POA: Diagnosis not present

## 2023-12-04 DIAGNOSIS — I5041 Acute combined systolic (congestive) and diastolic (congestive) heart failure: Secondary | ICD-10-CM | POA: Diagnosis not present

## 2023-12-04 DIAGNOSIS — I340 Nonrheumatic mitral (valve) insufficiency: Secondary | ICD-10-CM | POA: Diagnosis not present

## 2023-12-04 DIAGNOSIS — N182 Chronic kidney disease, stage 2 (mild): Secondary | ICD-10-CM | POA: Diagnosis not present

## 2023-12-11 DIAGNOSIS — M79604 Pain in right leg: Secondary | ICD-10-CM | POA: Diagnosis not present

## 2023-12-11 DIAGNOSIS — M79631 Pain in right forearm: Secondary | ICD-10-CM | POA: Diagnosis not present

## 2023-12-11 DIAGNOSIS — M79605 Pain in left leg: Secondary | ICD-10-CM | POA: Diagnosis not present

## 2023-12-11 DIAGNOSIS — M79632 Pain in left forearm: Secondary | ICD-10-CM | POA: Diagnosis not present

## 2023-12-17 DIAGNOSIS — J4520 Mild intermittent asthma, uncomplicated: Secondary | ICD-10-CM | POA: Diagnosis not present

## 2023-12-17 DIAGNOSIS — J449 Chronic obstructive pulmonary disease, unspecified: Secondary | ICD-10-CM | POA: Diagnosis not present

## 2023-12-17 DIAGNOSIS — I5041 Acute combined systolic (congestive) and diastolic (congestive) heart failure: Secondary | ICD-10-CM | POA: Diagnosis not present

## 2023-12-17 DIAGNOSIS — I340 Nonrheumatic mitral (valve) insufficiency: Secondary | ICD-10-CM | POA: Diagnosis not present

## 2023-12-17 DIAGNOSIS — D631 Anemia in chronic kidney disease: Secondary | ICD-10-CM | POA: Diagnosis not present

## 2023-12-17 DIAGNOSIS — G8929 Other chronic pain: Secondary | ICD-10-CM | POA: Diagnosis not present

## 2023-12-17 DIAGNOSIS — I130 Hypertensive heart and chronic kidney disease with heart failure and stage 1 through stage 4 chronic kidney disease, or unspecified chronic kidney disease: Secondary | ICD-10-CM | POA: Diagnosis not present

## 2023-12-17 DIAGNOSIS — M810 Age-related osteoporosis without current pathological fracture: Secondary | ICD-10-CM | POA: Diagnosis not present

## 2023-12-17 DIAGNOSIS — R269 Unspecified abnormalities of gait and mobility: Secondary | ICD-10-CM | POA: Diagnosis not present

## 2023-12-17 DIAGNOSIS — N182 Chronic kidney disease, stage 2 (mild): Secondary | ICD-10-CM | POA: Diagnosis not present

## 2023-12-17 DIAGNOSIS — M47816 Spondylosis without myelopathy or radiculopathy, lumbar region: Secondary | ICD-10-CM | POA: Diagnosis not present

## 2023-12-17 DIAGNOSIS — R7303 Prediabetes: Secondary | ICD-10-CM | POA: Diagnosis not present

## 2023-12-19 DIAGNOSIS — M79631 Pain in right forearm: Secondary | ICD-10-CM | POA: Diagnosis not present

## 2023-12-19 DIAGNOSIS — M25521 Pain in right elbow: Secondary | ICD-10-CM | POA: Diagnosis not present

## 2023-12-19 DIAGNOSIS — R296 Repeated falls: Secondary | ICD-10-CM | POA: Diagnosis not present

## 2023-12-19 DIAGNOSIS — R2681 Unsteadiness on feet: Secondary | ICD-10-CM | POA: Diagnosis not present

## 2023-12-19 DIAGNOSIS — W1830XA Fall on same level, unspecified, initial encounter: Secondary | ICD-10-CM | POA: Diagnosis not present

## 2023-12-19 DIAGNOSIS — M6281 Muscle weakness (generalized): Secondary | ICD-10-CM | POA: Diagnosis not present

## 2023-12-19 DIAGNOSIS — S5011XS Contusion of right forearm, sequela: Secondary | ICD-10-CM | POA: Diagnosis not present

## 2023-12-24 DIAGNOSIS — I130 Hypertensive heart and chronic kidney disease with heart failure and stage 1 through stage 4 chronic kidney disease, or unspecified chronic kidney disease: Secondary | ICD-10-CM | POA: Diagnosis not present

## 2023-12-24 DIAGNOSIS — M47816 Spondylosis without myelopathy or radiculopathy, lumbar region: Secondary | ICD-10-CM | POA: Diagnosis not present

## 2023-12-24 DIAGNOSIS — R269 Unspecified abnormalities of gait and mobility: Secondary | ICD-10-CM | POA: Diagnosis not present

## 2023-12-24 DIAGNOSIS — G8929 Other chronic pain: Secondary | ICD-10-CM | POA: Diagnosis not present

## 2023-12-24 DIAGNOSIS — D631 Anemia in chronic kidney disease: Secondary | ICD-10-CM | POA: Diagnosis not present

## 2023-12-24 DIAGNOSIS — M1711 Unilateral primary osteoarthritis, right knee: Secondary | ICD-10-CM | POA: Diagnosis not present

## 2023-12-24 DIAGNOSIS — N182 Chronic kidney disease, stage 2 (mild): Secondary | ICD-10-CM | POA: Diagnosis not present

## 2023-12-24 DIAGNOSIS — J4520 Mild intermittent asthma, uncomplicated: Secondary | ICD-10-CM | POA: Diagnosis not present

## 2023-12-24 DIAGNOSIS — I340 Nonrheumatic mitral (valve) insufficiency: Secondary | ICD-10-CM | POA: Diagnosis not present

## 2023-12-24 DIAGNOSIS — J449 Chronic obstructive pulmonary disease, unspecified: Secondary | ICD-10-CM | POA: Diagnosis not present

## 2023-12-24 DIAGNOSIS — R7303 Prediabetes: Secondary | ICD-10-CM | POA: Diagnosis not present

## 2023-12-24 DIAGNOSIS — I5041 Acute combined systolic (congestive) and diastolic (congestive) heart failure: Secondary | ICD-10-CM | POA: Diagnosis not present

## 2023-12-24 DIAGNOSIS — M810 Age-related osteoporosis without current pathological fracture: Secondary | ICD-10-CM | POA: Diagnosis not present

## 2023-12-27 DIAGNOSIS — E039 Hypothyroidism, unspecified: Secondary | ICD-10-CM | POA: Diagnosis not present

## 2023-12-27 DIAGNOSIS — M810 Age-related osteoporosis without current pathological fracture: Secondary | ICD-10-CM | POA: Diagnosis not present

## 2023-12-27 DIAGNOSIS — R7303 Prediabetes: Secondary | ICD-10-CM | POA: Diagnosis not present

## 2023-12-31 DIAGNOSIS — M47816 Spondylosis without myelopathy or radiculopathy, lumbar region: Secondary | ICD-10-CM | POA: Diagnosis not present

## 2023-12-31 DIAGNOSIS — Z961 Presence of intraocular lens: Secondary | ICD-10-CM | POA: Diagnosis not present

## 2023-12-31 DIAGNOSIS — J449 Chronic obstructive pulmonary disease, unspecified: Secondary | ICD-10-CM | POA: Diagnosis not present

## 2023-12-31 DIAGNOSIS — H3530 Unspecified macular degeneration: Secondary | ICD-10-CM | POA: Diagnosis not present

## 2023-12-31 DIAGNOSIS — J4520 Mild intermittent asthma, uncomplicated: Secondary | ICD-10-CM | POA: Diagnosis not present

## 2023-12-31 DIAGNOSIS — I130 Hypertensive heart and chronic kidney disease with heart failure and stage 1 through stage 4 chronic kidney disease, or unspecified chronic kidney disease: Secondary | ICD-10-CM | POA: Diagnosis not present

## 2023-12-31 DIAGNOSIS — N182 Chronic kidney disease, stage 2 (mild): Secondary | ICD-10-CM | POA: Diagnosis not present

## 2023-12-31 DIAGNOSIS — I340 Nonrheumatic mitral (valve) insufficiency: Secondary | ICD-10-CM | POA: Diagnosis not present

## 2023-12-31 DIAGNOSIS — M1711 Unilateral primary osteoarthritis, right knee: Secondary | ICD-10-CM | POA: Diagnosis not present

## 2023-12-31 DIAGNOSIS — M810 Age-related osteoporosis without current pathological fracture: Secondary | ICD-10-CM | POA: Diagnosis not present

## 2023-12-31 DIAGNOSIS — R7303 Prediabetes: Secondary | ICD-10-CM | POA: Diagnosis not present

## 2023-12-31 DIAGNOSIS — D631 Anemia in chronic kidney disease: Secondary | ICD-10-CM | POA: Diagnosis not present

## 2023-12-31 DIAGNOSIS — R269 Unspecified abnormalities of gait and mobility: Secondary | ICD-10-CM | POA: Diagnosis not present

## 2023-12-31 DIAGNOSIS — G8929 Other chronic pain: Secondary | ICD-10-CM | POA: Diagnosis not present

## 2023-12-31 DIAGNOSIS — I5041 Acute combined systolic (congestive) and diastolic (congestive) heart failure: Secondary | ICD-10-CM | POA: Diagnosis not present

## 2024-01-01 DIAGNOSIS — Z79899 Other long term (current) drug therapy: Secondary | ICD-10-CM | POA: Diagnosis not present

## 2024-01-01 DIAGNOSIS — G3184 Mild cognitive impairment, so stated: Secondary | ICD-10-CM | POA: Diagnosis not present

## 2024-01-02 DIAGNOSIS — I5022 Chronic systolic (congestive) heart failure: Secondary | ICD-10-CM | POA: Diagnosis not present

## 2024-01-02 DIAGNOSIS — Z131 Encounter for screening for diabetes mellitus: Secondary | ICD-10-CM | POA: Diagnosis not present

## 2024-01-02 DIAGNOSIS — I10 Essential (primary) hypertension: Secondary | ICD-10-CM | POA: Diagnosis not present

## 2024-01-02 DIAGNOSIS — N182 Chronic kidney disease, stage 2 (mild): Secondary | ICD-10-CM | POA: Diagnosis not present

## 2024-01-05 DIAGNOSIS — R7303 Prediabetes: Secondary | ICD-10-CM | POA: Diagnosis not present

## 2024-01-05 DIAGNOSIS — M47816 Spondylosis without myelopathy or radiculopathy, lumbar region: Secondary | ICD-10-CM | POA: Diagnosis not present

## 2024-01-05 DIAGNOSIS — I5041 Acute combined systolic (congestive) and diastolic (congestive) heart failure: Secondary | ICD-10-CM | POA: Diagnosis not present

## 2024-01-05 DIAGNOSIS — I130 Hypertensive heart and chronic kidney disease with heart failure and stage 1 through stage 4 chronic kidney disease, or unspecified chronic kidney disease: Secondary | ICD-10-CM | POA: Diagnosis not present

## 2024-01-05 DIAGNOSIS — I340 Nonrheumatic mitral (valve) insufficiency: Secondary | ICD-10-CM | POA: Diagnosis not present

## 2024-01-05 DIAGNOSIS — M810 Age-related osteoporosis without current pathological fracture: Secondary | ICD-10-CM | POA: Diagnosis not present

## 2024-01-05 DIAGNOSIS — D631 Anemia in chronic kidney disease: Secondary | ICD-10-CM | POA: Diagnosis not present

## 2024-01-05 DIAGNOSIS — N182 Chronic kidney disease, stage 2 (mild): Secondary | ICD-10-CM | POA: Diagnosis not present

## 2024-01-05 DIAGNOSIS — R269 Unspecified abnormalities of gait and mobility: Secondary | ICD-10-CM | POA: Diagnosis not present

## 2024-01-05 DIAGNOSIS — J4520 Mild intermittent asthma, uncomplicated: Secondary | ICD-10-CM | POA: Diagnosis not present

## 2024-01-05 DIAGNOSIS — J449 Chronic obstructive pulmonary disease, unspecified: Secondary | ICD-10-CM | POA: Diagnosis not present

## 2024-01-05 DIAGNOSIS — M1711 Unilateral primary osteoarthritis, right knee: Secondary | ICD-10-CM | POA: Diagnosis not present

## 2024-01-05 DIAGNOSIS — G8929 Other chronic pain: Secondary | ICD-10-CM | POA: Diagnosis not present

## 2024-01-21 ENCOUNTER — Ambulatory Visit (HOSPITAL_COMMUNITY): Admission: RE | Admit: 2024-01-21 | Source: Ambulatory Visit

## 2024-01-28 ENCOUNTER — Telehealth (HOSPITAL_COMMUNITY): Payer: Self-pay

## 2024-01-28 NOTE — Telephone Encounter (Signed)
 Called to confirm/remind patient of their appointment at the [facility] on 01/29/24.   Appointment:   [] Confirmed  [x] Left mess   [] No answer/No voice mail  [] VM Full/unable to leave message  [] Phone not in service  And to bring in all medications and/or complete list.

## 2024-01-29 ENCOUNTER — Ambulatory Visit (HOSPITAL_COMMUNITY)
Admission: RE | Admit: 2024-01-29 | Discharge: 2024-01-29 | Disposition: A | Discharge status: Home / Self Care | Source: Ambulatory Visit | Attending: Family Medicine | Admitting: Family Medicine

## 2024-01-29 ENCOUNTER — Encounter (HOSPITAL_COMMUNITY): Payer: Self-pay

## 2024-01-29 VITALS — BP 94/60 | HR 65 | Ht 62.5 in | Wt 199.0 lb

## 2024-01-29 DIAGNOSIS — J45909 Unspecified asthma, uncomplicated: Secondary | ICD-10-CM | POA: Insufficient documentation

## 2024-01-29 DIAGNOSIS — I5022 Chronic systolic (congestive) heart failure: Secondary | ICD-10-CM | POA: Insufficient documentation

## 2024-01-29 DIAGNOSIS — Z7951 Long term (current) use of inhaled steroids: Secondary | ICD-10-CM | POA: Insufficient documentation

## 2024-01-29 DIAGNOSIS — Z635 Disruption of family by separation and divorce: Secondary | ICD-10-CM | POA: Diagnosis not present

## 2024-01-29 DIAGNOSIS — I517 Cardiomegaly: Secondary | ICD-10-CM | POA: Insufficient documentation

## 2024-01-29 DIAGNOSIS — Z79899 Other long term (current) drug therapy: Secondary | ICD-10-CM | POA: Insufficient documentation

## 2024-01-29 DIAGNOSIS — Z7984 Long term (current) use of oral hypoglycemic drugs: Secondary | ICD-10-CM | POA: Diagnosis not present

## 2024-01-29 DIAGNOSIS — Z8616 Personal history of COVID-19: Secondary | ICD-10-CM | POA: Diagnosis not present

## 2024-01-29 DIAGNOSIS — I447 Left bundle-branch block, unspecified: Secondary | ICD-10-CM | POA: Diagnosis not present

## 2024-01-29 DIAGNOSIS — F209 Schizophrenia, unspecified: Secondary | ICD-10-CM | POA: Insufficient documentation

## 2024-01-29 LAB — BASIC METABOLIC PANEL WITH GFR
Anion gap: 6 (ref 5–15)
BUN: 25 mg/dL — ABNORMAL HIGH (ref 8–23)
CO2: 24 mmol/L (ref 22–32)
Calcium: 8.1 mg/dL — ABNORMAL LOW (ref 8.9–10.3)
Chloride: 93 mmol/L — ABNORMAL LOW (ref 98–111)
Creatinine, Ser: 1.28 mg/dL — ABNORMAL HIGH (ref 0.44–1.00)
GFR, Estimated: 42 mL/min — ABNORMAL LOW (ref >60)
Glucose, Bld: 100 mg/dL — ABNORMAL HIGH (ref 70–99)
Potassium: 4.8 mmol/L (ref 3.5–5.1)
Sodium: 123 mmol/L — ABNORMAL LOW (ref 135–145)

## 2024-01-29 LAB — BRAIN NATRIURETIC PEPTIDE: B Natriuretic Peptide: 433.7 pg/mL — ABNORMAL HIGH (ref 0.0–100.0)

## 2024-01-29 MED ORDER — BISOPROLOL FUMARATE 2.5 MG PO TABS: 2.5000 mg | ORAL_TABLET | Freq: Every day | ORAL | 2 refills | Status: AC

## 2024-01-29 NOTE — Patient Instructions (Addendum)
 Good to see you today!  STOP Coreg   START bisoprolol   2.5 mg ( 1 tablet) daily  Labs done today, your results will be available in MyChart, we will contact you for abnormal readings.  Your physician has requested that you have an echocardiogram. Echocardiography is a painless test that uses sound waves to create images of your heart. It provides your doctor with information about the size and shape of your heart and how well your heart's chambers and valves are working. This procedure takes approximately one hour. There are no restrictions for this procedure. Please do NOT wear cologne, perfume, aftershave, or lotions (deodorant is allowed). Please arrive 15 minutes prior to your appointment time.  Please note: We ask at that you not bring children with you during ultrasound (echo/ vascular) testing. Due to room size and safety concerns, children are not allowed in the ultrasound rooms during exams. Our front office staff cannot provide observation of children in our lobby area while testing is being conducted. An adult accompanying a patient to their appointment will only be allowed in the ultrasound room at the discretion of the ultrasound technician under special circumstances. We apologize for any inconvenience.  Your physician recommends that you schedule a follow-up appointment  3 months with echocardiogram (January) Call office in November to schedule an appointment  If you have any questions or concerns before your next appointment please send us  a message through [place] or call our office at [phone].    TO LEAVE A MESSAGE FOR THE NURSE SELECT OPTION 2, PLEASE LEAVE A MESSAGE INCLUDING: YOUR NAME DATE OF BIRTH CALL BACK NUMBER REASON FOR CALL**this is important as we prioritize the call backs  YOU WILL RECEIVE A CALL BACK THE SAME DAY AS LONG AS YOU CALL BEFORE 4:00 PM At the [facility], you and your health needs are our priority. As part of our continuing  mission to provide you with exceptional heart care, we have created designated Provider Care Teams. These Care Teams include your primary Cardiologist (physician) and Advanced Practice Providers (APPs- Physician Assistants and Nurse Practitioners) who all work together to provide you with the care you need, when you need it.   You may see any of the following providers on your designated Care Team at your next follow up: Dr Toribio Fuel Dr Ezra Shuck Dr. Ria Commander Dr. Morene Brownie Amy Lenetta, NP Caffie Shed, [place] [facility] [place], [place] Beckey Coe, NP [place] Lee, NP Ellouise Class, NP Tinnie Redman, PharmD Jaun Bash, PharmD   Please be sure to bring in all your medications bottles to every appointment.    Thank you for choosing [facility]

## 2024-01-29 NOTE — Progress Notes (Signed)
 ReDS Vest / Clip - 01/29/24 1411       ReDS Vest / Clip   Station Marker A    Ruler Value 29    ReDS Value Range Low volume    ReDS Actual Value 24

## 2024-01-29 NOTE — Progress Notes (Signed)
 [facility]  PCP: Barbaraann Harvey, FNP Cardiology: Dr. Shlomo HF Cardiology: Dr. Rolan  83 y.o. with history of CKD stage 3, schizophrenia, and chronic systolic CHF was referred by Dr. Shlomo for evaluation of CHF.  She lives in an assisted living facility.  She does not smoke or drink ETOH.  Patient has been known to have a cardiomyopathy since 4/23.  Echo at that time showed EF 25%.  She had a cardiac PET done in 8/23 that showed no ischemia.  Echo in 10/23 showed that EF remained low at 30-35%, and cardiac MRI showed  LVEF 23%, moderate LV dilation, septal-lateral dyssynchrony, RVEF 46%, nonspecific RV insertion site LGE (consistent with NICM).   Today she returns for HF follow up with caregiver. Overall feeling fine. She remains at ALF. She walks with a rolling walker, she has SOB walking further distances on flat ground at her facility. She has occasional positional dizziness, no falls or syncope. Has a chronic cough x 50 years. Enjoys spicy foods. Denies palpitations, abnormal bleeding, CP, edema, or PND/Orthopnea. Appetite ok. Weight at home 187 pounds. Taking all medications provided by facility.   ReDs reading: 24 %, normal  Labs (11/24): sCr 1.3, BNP 380 (up from 263) Labs (5/25): K 4.6, creatinine 1.55 Labs (7/25): K 4.9, creatinine 1.31  ECG (personally reviewed): none ordered today.  PMH: 1. Asthma 2. CKD stage 3 3. Schizophrenia 4. H/o left nephrectomy 5. Subdural hematoma: 10/23, due to fall.  6. Chronic systolic CHF: Nonischemic cardiomyopathy.  - Echo (4/23): EF 25% - Echo (10/23): EF 30-35%, normal RV, mild-moderate MR - Cardiac PET (8/23): EF 28%, no ischemia, coronary calcifications noted.  - Cardiac MRI (11/23): LVEF 23%, moderate LV dilation, septal-lateral dyssynchrony, RVEF 46%, nonspecific RV insertion site LGE (consistent with NICM).  7. LBBB 8. COVID-19 x 3  Social History   Socioeconomic History   Marital status: Legally Separated     Spouse name: Not on file   Number of children: 10   Years of education: Not on file   Highest education level: Not on file  Occupational History   Occupation: retired  Tobacco Use   Smoking status: Never   Smokeless tobacco: Never  Vaping Use   Vaping status: Never Used  Substance and Sexual Activity   Alcohol  use: Not Currently    Comment: occassionally   Drug use: No   Sexual activity: Not Currently  Other Topics Concern   Not on file  Social History Narrative   Not on file   Social Drivers of Health   Financial Resource Strain: Not on file  Food Insecurity: No Food Insecurity (04/23/2022)   Hunger Vital Sign    Worried About Running Out of Food in the Last Year: Never true    Ran Out of Food in the Last Year: Never true  Transportation Needs: No Transportation Needs (04/23/2022)   PRAPARE - Administrator, Civil Service (Medical): No    Lack of Transportation (Non-Medical): No  Physical Activity: Not on file  Stress: Not on file  Social Connections: Not on file  Intimate Partner Violence: Not At Risk (04/23/2022)   Humiliation, Afraid, Rape, and Kick questionnaire    Fear of Current or Ex-Partner: No    Emotionally Abused: No    Physically Abused: No    Sexually Abused: No   Family History  Problem Relation Age of Onset   Hypertension Mother    Heart failure Mother    Hyperlipidemia Mother  Stroke Mother    Hypertension Father    Heart attack Father    COPD Father    Diabetes Paternal Aunt    Diabetes Paternal Grandmother    Colon polyps Other        niece   Stomach cancer Neg Hx    Rectal cancer Neg Hx    Esophageal cancer Neg Hx    Colon cancer Neg Hx    ROS: All systems reviewed and negative except as per HPI.   Current Outpatient Medications  Medication Sig Dispense Refill   amantadine  (SYMMETREL ) 100 MG capsule Take 100 mg by mouth 2 (two) times daily.     BREO ELLIPTA  200-25 MCG/INH AEPB Inhale 1 puff into the lungs daily.      Calcium  Carb-Cholecalciferol  (CALCIUM  600+D3 PO) Take 1 tablet by mouth daily.     carvedilol  (COREG ) 3.125 MG tablet Take 1 tablet (3.125 mg total) by mouth 2 (two) times daily. 180 tablet 3   cetirizine  (ZYRTEC ) 10 MG tablet Take 10 mg by mouth daily.     estradiol  (ESTRACE ) 0.1 MG/GM vaginal cream Discard plastic applicator. Insert a blueberry size amount (About 1 g) of cream on fingertip inside vagina every other night. For long term use. 42.5 g 2   FARXIGA  10 MG TABS tablet Take 1 tablet (10 mg total) by mouth daily before breakfast. 90 tablet 0   furosemide  (LASIX ) 20 MG tablet Take 1 tablet (20 mg total) by mouth daily. 30 tablet 1   ibuprofen  (ADVIL ) 400 MG tablet Take 400 mg by mouth every 8 (eight) hours as needed for cramping, moderate pain or fever.     loperamide (IMODIUM) 2 MG capsule Take 2 mg by mouth as needed for diarrhea or loose stools.     melatonin 3 MG TABS tablet Take 3 mg by mouth at bedtime.     methocarbamol  (ROBAXIN ) 500 MG tablet Take 1 tablet (500 mg total) by mouth 2 (two) times daily as needed for muscle spasms. 20 tablet 0   mirtazapine  (REMERON ) 7.5 MG tablet Take 1 tablet (7.5 mg total) by mouth at bedtime. 30 tablet 0   omeprazole  (PRILOSEC) 20 MG capsule Take 1 capsule (20 mg total) by mouth daily. 30 capsule 0   risperiDONE  (RISPERDAL ) 1 MG tablet Take 1 tablet (1 mg total) by mouth at bedtime. 30 tablet 1   sacubitril -valsartan  (ENTRESTO ) 24-26 MG Take 1 tablet by mouth 2 (two) times daily. 60 tablet 0   spironolactone  (ALDACTONE ) 25 MG tablet TAKE 1/2 TABLET BY MOUTH DAILY 45 tablet 3   fluticasone  (FLONASE ) 50 MCG/ACT nasal spray Place 2 sprays into both nostrils daily. (Patient not taking: Reported on 01/29/2024)     No current facility-administered medications for this encounter.   Wt Readings from Last 3 Encounters:  01/29/24 90.3 kg (199 lb)  10/30/23 89 kg (196 lb 3.2 oz)  06/03/23 85.3 kg (188 lb)   BP 94/60   Pulse 65   Ht 5' 2.5 (1.588 m)    Wt 90.3 kg (199 lb)   SpO2 95%   BMI 35.82 kg/m   Physical Exam General:  NAD. No resp difficulty, arrived in [facility], elderly HEENT: Normal Neck: Supple. No JVD. Cor: Regular rate & rhythm. No rubs, gallops or murmurs. Lungs: wheezes lower lobes Abdomen: Soft, obese, nontender, nondistended.  Extremities: No cyanosis, clubbing, rash, edema Neuro: Alert & oriented x 3, moves all 4 extremities w/o difficulty. Affect pleasant.  Assessment/Plan: 1. Chronic systolic CHF:  Diagnosed in  4/23 with echo showed EF 25%.  Suspect nonischemic cardiomyopathy, cardiac PET showed no ischemia and cardiac MRI did not show evidence for MI.  No ETOH history.  Mother with HF in her 42s. ?post-viral myocarditis, had 3 bouts with COVID-19.  Last echo in 10/23 showed EF 30-35% withj normal RV.  Cardiac MRI in 11/23 showed LVEF 23%, moderate LV dilation, septal-lateral dyssynchrony, RVEF 46%, nonspecific RV insertion site LGE (consistent with NICM). NYHA class II symptoms. She is not volume overloaded by exam. - Continue Entresto  24/26 bid.  - Continue Lasix  20 mg daily, BMET/BNP today.  - Continue spironolactone  12.5 mg daily.  - Continue Farxiga  10 mg daily.  - With wheezing on exam and hx of asthma, stop Coreg  and start more cardio-selective bisoprolol 2.5 mg daily. - Arrange for repeat echo.  If EF remains < 35%, would consider her for CRT-P given wide LBBB. With age and nonischemic etiology as well as only minimal LGE on MRI, think we could avoid ICD.  2. Schizophrenia: Appropriate with conversation today.   Follow up in 3 months with Dr. Rolan + echo  Harlene HERO [facility] FNP-BC 01/29/2024

## 2024-01-31 ENCOUNTER — Ambulatory Visit (HOSPITAL_COMMUNITY): Payer: Self-pay | Admitting: Family Medicine

## 2024-02-10 ENCOUNTER — Other Ambulatory Visit: Payer: Self-pay | Admitting: Cardiology

## 2024-03-12 ENCOUNTER — Ambulatory Visit (HOSPITAL_COMMUNITY): Admission: RE | Admit: 2024-03-12 | Source: Ambulatory Visit

## 2024-03-24 ENCOUNTER — Ambulatory Visit: Admitting: Adult Health

## 2024-03-24 ENCOUNTER — Encounter: Payer: Self-pay | Admitting: Adult Health

## 2024-03-24 VITALS — BP 111/72 | HR 64 | Ht 62.5 in | Wt 206.0 lb

## 2024-03-24 DIAGNOSIS — N907 Vulvar cyst: Secondary | ICD-10-CM | POA: Diagnosis not present

## 2024-03-24 NOTE — Progress Notes (Signed)
°  Subjective:     Patient ID: [name], female   DOB: Jan 09, 1941, 83 y.o.   MRN: [id number]  HPI [name] is a 83 year old white female, single, PM in complaining of lesions on vaginal area. She resides at [facility].  PCP is Sari Dicker FNP  Review of Systems Lesions on vaginal area outside Reviewed past medical,surgical, social and family history. Reviewed medications and allergies.     Objective:   Physical Exam BP 111/72 (BP Location: Right Arm, Patient Position: Sitting, Cuff Size: Large)   Pulse 64   Ht 5' 2.5 (1.588 m)   Wt 206 lb (93.4 kg)   BMI 37.08 kg/m     Skin warm and dry.Pelvic: external genitalia is normal in appearance, has multiple epidermal cysts bilaterally  Fall risk is high  Upstream - 03/24/24 1429       Pregnancy Intention Screening   Does the patient want to become pregnant in the next year? N/A    Does the patient's partner want to become pregnant in the next year? N/A    Would the patient like to discuss contraceptive options today? N/A      Contraception Wrap Up   Current Method Post-Menopause    End Method Post-Menopause    Contraception Counseling Provided No         Examination chaperoned by Clarita Salt LPN Assessment:     1. Epidermal cyst of vulva, has multiple cysts bilateral vulva (Primary) Do not squeeze, just keep clean and dry   I showed her pictures in the [facility]at[facility]  Plan:     Follow up prn

## 2024-04-01 ENCOUNTER — Ambulatory Visit (HOSPITAL_COMMUNITY)
Admission: RE | Admit: 2024-04-01 | Discharge: 2024-04-01 | Disposition: A | Discharge status: Home / Self Care | Source: Ambulatory Visit | Attending: Family Medicine | Admitting: Family Medicine

## 2024-04-01 DIAGNOSIS — I5022 Chronic systolic (congestive) heart failure: Secondary | ICD-10-CM | POA: Insufficient documentation

## 2024-04-01 LAB — ECHOCARDIOGRAM COMPLETE
Area-P 1/2: 6.54 cm2
Calc EF: 28 %
S' Lateral: 5.9 cm
Single Plane A2C EF: 26.5 %
Single Plane A4C EF: 27.2 %

## 2024-04-01 MED ORDER — PERFLUTREN LIPID MICROSPHERE
1.0000 mL | INTRAVENOUS | Status: AC | PRN
  Administered 2024-04-01: 3 mL via INTRAVENOUS

## 2024-04-01 NOTE — Progress Notes (Signed)
*  PRELIMINARY RESULTS* Echocardiogram 2D Echocardiogram has been performed with Definity .  [name] 04/01/2024, 1:49 PM

## 2024-04-14 ENCOUNTER — Other Ambulatory Visit: Payer: Self-pay | Admitting: Cardiology

## 2024-04-15 ENCOUNTER — Encounter (INDEPENDENT_AMBULATORY_CARE_PROVIDER_SITE_OTHER): Payer: 59 | Admitting: Ophthalmology

## 2024-04-28 ENCOUNTER — Telehealth (HOSPITAL_COMMUNITY): Payer: Self-pay

## 2024-04-28 NOTE — Telephone Encounter (Signed)
 Called to confirm/remind patient of their appointment at the [facility] on 04/28/24.   Appointment:   [x] Confirmed  [] Left mess   [] No answer/No voice mail  [] VM Full/unable to leave message  [] Phone not in service  Patient reminded to bring all medications and/or complete list.  Confirmed patient has transportation. Gave directions, instructed to utilize valet parking.

## 2024-04-29 ENCOUNTER — Ambulatory Visit (HOSPITAL_COMMUNITY): Payer: Self-pay | Admitting: Physician Assistant

## 2024-04-29 ENCOUNTER — Ambulatory Visit (HOSPITAL_COMMUNITY)
Admission: RE | Admit: 2024-04-29 | Discharge: 2024-04-29 | Disposition: A | Discharge status: Home / Self Care | Source: Ambulatory Visit | Attending: Physician Assistant | Admitting: Physician Assistant

## 2024-04-29 ENCOUNTER — Encounter (HOSPITAL_COMMUNITY): Payer: Self-pay

## 2024-04-29 VITALS — BP 114/86 | HR 68 | Ht 62.5 in | Wt 205.6 lb

## 2024-04-29 DIAGNOSIS — E871 Hypo-osmolality and hyponatremia: Secondary | ICD-10-CM | POA: Diagnosis not present

## 2024-04-29 DIAGNOSIS — F209 Schizophrenia, unspecified: Secondary | ICD-10-CM | POA: Insufficient documentation

## 2024-04-29 DIAGNOSIS — B3322 Viral myocarditis: Secondary | ICD-10-CM | POA: Diagnosis not present

## 2024-04-29 DIAGNOSIS — Z8616 Personal history of COVID-19: Secondary | ICD-10-CM | POA: Insufficient documentation

## 2024-04-29 DIAGNOSIS — Z7951 Long term (current) use of inhaled steroids: Secondary | ICD-10-CM | POA: Insufficient documentation

## 2024-04-29 DIAGNOSIS — I514 Myocarditis, unspecified: Secondary | ICD-10-CM | POA: Insufficient documentation

## 2024-04-29 DIAGNOSIS — I517 Cardiomegaly: Secondary | ICD-10-CM | POA: Insufficient documentation

## 2024-04-29 DIAGNOSIS — I5022 Chronic systolic (congestive) heart failure: Secondary | ICD-10-CM | POA: Insufficient documentation

## 2024-04-29 DIAGNOSIS — Z7984 Long term (current) use of oral hypoglycemic drugs: Secondary | ICD-10-CM | POA: Diagnosis not present

## 2024-04-29 DIAGNOSIS — Z79899 Other long term (current) drug therapy: Secondary | ICD-10-CM | POA: Insufficient documentation

## 2024-04-29 DIAGNOSIS — I428 Other cardiomyopathies: Secondary | ICD-10-CM | POA: Diagnosis not present

## 2024-04-29 DIAGNOSIS — I447 Left bundle-branch block, unspecified: Secondary | ICD-10-CM | POA: Diagnosis not present

## 2024-04-29 LAB — COMPREHENSIVE METABOLIC PANEL WITH GFR
ALT: 16 U/L (ref 0–44)
AST: 29 U/L (ref 15–41)
Albumin: 4.1 g/dL (ref 3.5–5.0)
Alkaline Phosphatase: 112 U/L (ref 38–126)
Anion gap: 12 (ref 5–15)
BUN: 28 mg/dL — ABNORMAL HIGH (ref 8–23)
CO2: 26 mmol/L (ref 22–32)
Calcium: 9.2 mg/dL (ref 8.9–10.3)
Chloride: 98 mmol/L (ref 98–111)
Creatinine, Ser: 1.37 mg/dL — ABNORMAL HIGH (ref 0.44–1.00)
GFR, Estimated: 38 mL/min — ABNORMAL LOW
Glucose, Bld: 98 mg/dL (ref 70–99)
Potassium: 5.2 mmol/L — ABNORMAL HIGH (ref 3.5–5.1)
Sodium: 136 mmol/L (ref 135–145)
Total Bilirubin: 0.5 mg/dL (ref 0.0–1.2)
Total Protein: 6.7 g/dL (ref 6.5–8.1)

## 2024-04-29 LAB — PRO BRAIN NATRIURETIC PEPTIDE: Pro Brain Natriuretic Peptide: 11457 pg/mL — ABNORMAL HIGH

## 2024-04-29 NOTE — Progress Notes (Addendum)
 [facility]  PCP: Barbaraann Harvey, FNP Cardiology: Dr. Shlomo HF Cardiology: Dr. Rolan  84 y.o. with history of CKD stage 3, schizophrenia, hyponatremia and chronic systolic CHF was referred by Dr. Shlomo for evaluation of CHF.  She lives in an assisted living facility.  She does not smoke or drink ETOH.  Patient has been known to have a cardiomyopathy since 4/23.  Echo at that time showed EF 25%.  She had a cardiac PET done in 8/23 that showed no ischemia.  Echo in 10/23 showed that EF remained low at 30-35%, and cardiac MRI showed  LVEF 23%, moderate LV dilation, septal-lateral dyssynchrony, RVEF 46%, nonspecific RV insertion site LGE (consistent with NICM).   Echo 12/25: EF 20-25%, grade I DD, RV okay  Here today for CHF follow-up with a caregiver. Remains at ALF. She has been recovering from a viral illness. Ambulates with a walker. Gets winded if she tries to walk too quickly down the long hallway at the facility. No orthopnea, PND or lower extremity edema. She has gained about 20 lb over the last year which she attributes to po intake. Enjoys late night pizza and stores snacks in her rollator. Her meds are administered by staff at the facility.   Labs (11/24): sCr 1.3, BNP 380 (up from 263) Labs (5/25): K 4.6, creatinine 1.55 Labs (7/25): K 4.9, creatinine 1.31  ECG (personally reviewed): none today  PMH: 1. Asthma 2. CKD stage 3 3. Schizophrenia 4. H/o left nephrectomy 5. Subdural hematoma: 10/23, due to fall.  6. Chronic systolic CHF: Nonischemic cardiomyopathy.  - Echo (4/23): EF 25% - Echo (10/23): EF 30-35%, normal RV, mild-moderate MR - Cardiac PET (8/23): EF 28%, no ischemia, coronary calcifications noted.  - Cardiac MRI (11/23): LVEF 23%, moderate LV dilation, septal-lateral dyssynchrony, RVEF 46%, nonspecific RV insertion site LGE (consistent with NICM).  7. LBBB 8. COVID-19 x 3  Social History   Socioeconomic History   Marital status: Single     Spouse name: Not on file   Number of children: 10   Years of education: Not on file   Highest education level: Not on file  Occupational History   Occupation: retired  Tobacco Use   Smoking status: Never   Smokeless tobacco: Never  Vaping Use   Vaping status: Never Used  Substance and Sexual Activity   Alcohol  use: Not Currently    Comment: occassionally   Drug use: No   Sexual activity: Not Currently    Birth control/protection: Post-menopausal  Other Topics Concern   Not on file  Social History Narrative   Not on file   Social Drivers of Health   Tobacco Use: Low Risk (04/29/2024)   Patient History    Smoking Tobacco Use: Never    Smokeless Tobacco Use: Never    Passive Exposure: Not on file  Financial Resource Strain: Not on file  Food Insecurity: No Food Insecurity (04/23/2022)   Hunger Vital Sign    Worried About Running Out of Food in the Last Year: Never true    Ran Out of Food in the Last Year: Never true  Transportation Needs: No Transportation Needs (04/23/2022)   PRAPARE - Administrator, Civil Service (Medical): No    Lack of Transportation (Non-Medical): No  Physical Activity: Not on file  Stress: Not on file  Social Connections: Not on file  Intimate Partner Violence: Not At Risk (04/23/2022)   Humiliation, Afraid, Rape, and Kick questionnaire    Fear  of Current or Ex-Partner: No    Emotionally Abused: No    Physically Abused: No    Sexually Abused: No  Depression (PHQ2-9): Not on file  Alcohol  Screen: Not on file  Housing: Low Risk (04/23/2022)   Housing    Last Housing Risk Score: 0  Utilities: Not At Risk (04/23/2022)   AHC Utilities    Threatened with loss of utilities: No  Health Literacy: Not on file   Family History  Problem Relation Age of Onset   Hypertension Mother    Heart failure Mother    Hyperlipidemia Mother    Stroke Mother    Hypertension Father    Heart attack Father    COPD Father    Diabetes Paternal Aunt     Diabetes Paternal Grandmother    Colon polyps Other        niece   Stomach cancer Neg Hx    Rectal cancer Neg Hx    Esophageal cancer Neg Hx    Colon cancer Neg Hx    ROS: All systems reviewed and negative except as per HPI.   Current Outpatient Medications  Medication Sig Dispense Refill   amantadine  (SYMMETREL ) 100 MG capsule Take 100 mg by mouth 2 (two) times daily.     ARIPiprazole (ABILIFY) 20 MG tablet Take 20 mg by mouth daily.     Bisoprolol  Fumarate 2.5 MG TABS Take 2.5 mg by mouth daily. 90 tablet 2   BREO ELLIPTA  200-25 MCG/INH AEPB Inhale 1 puff into the lungs daily.     Calcium  Carb-Cholecalciferol  (CALCIUM  600+D3 PO) Take 1 tablet by mouth daily.     cetirizine  (ZYRTEC ) 10 MG tablet Take 10 mg by mouth daily.     dapagliflozin  propanediol (FARXIGA ) 10 MG TABS tablet Take 1 tablet (10 mg total) by mouth daily before breakfast. 30 tablet 5   estradiol  (ESTRACE ) 0.1 MG/GM vaginal cream Discard plastic applicator. Insert a blueberry size amount (About 1 g) of cream on fingertip inside vagina every other night. For long term use. 42.5 g 2   furosemide  (LASIX ) 20 MG tablet Take 1 tablet (20 mg total) by mouth daily. 30 tablet 1   Ipratropium-Albuterol  (COMBIVENT RESPIMAT) 20-100 MCG/ACT AERS respimat Inhale 1 puff into the lungs every 6 (six) hours.     loperamide (IMODIUM) 2 MG capsule Take 2 mg by mouth as needed for diarrhea or loose stools.     melatonin 3 MG TABS tablet Take 3 mg by mouth at bedtime.     methocarbamol  (ROBAXIN ) 500 MG tablet Take 1 tablet (500 mg total) by mouth 2 (two) times daily as needed for muscle spasms. 20 tablet 0   mirtazapine  (REMERON ) 7.5 MG tablet Take 1 tablet (7.5 mg total) by mouth at bedtime. 30 tablet 0   Mouthwashes (DRY MOUTH MOUTHWASH MT) Use as directed in the mouth or throat.     Multiple Vitamins-Minerals (PRESERVISION AREDS 2) CHEW Chew by mouth.     omeprazole  (PRILOSEC) 20 MG capsule Take 1 capsule (20 mg total) by mouth daily. 30  capsule 0   Phenylephrine  HCl (PREPARATION H RE) Place rectally.     sacubitril -valsartan  (ENTRESTO ) 24-26 MG Take 1 tablet by mouth 2 (two) times daily. 60 tablet 0   spironolactone  (ALDACTONE ) 25 MG tablet TAKE 1/2 TABLET BY MOUTH DAILY 45 tablet 3   No current facility-administered medications for this encounter.   Wt Readings from Last 3 Encounters:  04/29/24 93.3 kg (205 lb 9.6 oz)  03/24/24 93.4 kg (  206 lb)  01/29/24 90.3 kg (199 lb)   BP 114/86   Pulse 68   Ht 5' 2.5 (1.588 m)   Wt 93.3 kg (205 lb 9.6 oz)   SpO2 94%   BMI 37.01 kg/m   Physical Exam General:  Chronically ill appearing elderly female Neck: no JVD.  Cor: Regular rate & rhythm. No murmurs. Lungs: scattered expiratory wheezes Abdomen: obese, soft, nontender, nondistended. Extremities: 1+ dependent edema Neuro: alert & orientedx3. Affect pleasant.   Assessment/Plan: 1. Chronic systolic CHF:  Diagnosed in 4/23 with echo showed EF 25%.  Suspect nonischemic cardiomyopathy, cardiac PET showed no ischemia and cardiac MRI did not show evidence for MI.  No ETOH history.  Mother with HF in her 51s. ?post-viral myocarditis, had 3 bouts with COVID-19.  Last echo in 10/23 showed EF 30-35% withj normal RV.  Cardiac MRI in 11/23 showed LVEF 23%, moderate LV dilation, septal-lateral dyssynchrony, RVEF 46%, nonspecific RV insertion site LGE (consistent with NICM). Echo 12/25: EF 20-25%, RV okay. NYHA II/early III symptoms. Volume okay on exam.  Weight gain likely 2/2 caloric intake rather than fluid. - continue lasix  20 mg daily. - Continue Entresto  24/26 bid.   - Continue spironolactone  12.5 mg daily.  - Continue Farxiga  10 mg daily.  - Continue bisoprolol  2.5 mg daily - EF remains < 35%, refer to EP for consideration of CRT-P given wide LBBB. With age and nonischemic etiology as well as only minimal LGE on MRI, think we could avoid ICD.  2. LBBB - Consider CRT-P as above 3. Schizophrenia: Appropriate with conversation  today.  4. Hyponatremia: Check labs today - May be exacerbated by antipsychotics  Follow up 3 months with Dr. Rolan  [facility], [facility] N PA-C 04/29/2024

## 2024-04-29 NOTE — Patient Instructions (Signed)
 No medication changes. Referral sent to Cardiac Electrophysiology. They should cal within 10 - 14 days, if not, please call them at number listed below. Return to see Dr. Rolan in 3 months - see below. Please call us  at [phone] if any questions or concerns prior to your next appointment.

## 2024-04-29 NOTE — Addendum Note (Signed)
 Encounter addended by: Colletta Manuelita Garre, PA-C on: 04/29/2024 5:12 PM  Actions taken: Visit diagnoses modified

## 2024-05-06 ENCOUNTER — Encounter (INDEPENDENT_AMBULATORY_CARE_PROVIDER_SITE_OTHER): Admitting: Ophthalmology

## 2024-06-18 ENCOUNTER — Encounter (INDEPENDENT_AMBULATORY_CARE_PROVIDER_SITE_OTHER): Admitting: Ophthalmology

## 2024-06-24 ENCOUNTER — Ambulatory Visit: Admitting: Cardiovascular Disease

## 2024-08-03 ENCOUNTER — Ambulatory Visit (HOSPITAL_COMMUNITY): Admitting: Cardiology

## 2024-09-21 ENCOUNTER — Ambulatory Visit: Admitting: Urology
